# Patient Record
Sex: Female | Born: 1940 | Race: White | Hispanic: No | Smoking: Former smoker
Health system: Southern US, Community
[De-identification: ages and names within clinical notes are randomized; demographics above are authoritative.]

## PROBLEM LIST (undated history)

## (undated) ENCOUNTER — Inpatient Hospital Stay: Admission: AD | Payer: Self-pay | Source: Other Acute Inpatient Hospital | Admitting: Internal Medicine

## (undated) DIAGNOSIS — H8109 Meniere's disease, unspecified ear: Secondary | ICD-10-CM

## (undated) DIAGNOSIS — K219 Gastro-esophageal reflux disease without esophagitis: Secondary | ICD-10-CM

## (undated) DIAGNOSIS — R5381 Other malaise: Secondary | ICD-10-CM

## (undated) DIAGNOSIS — E785 Hyperlipidemia, unspecified: Secondary | ICD-10-CM

## (undated) DIAGNOSIS — E119 Type 2 diabetes mellitus without complications: Secondary | ICD-10-CM

## (undated) DIAGNOSIS — F419 Anxiety disorder, unspecified: Secondary | ICD-10-CM

## (undated) DIAGNOSIS — R251 Tremor, unspecified: Secondary | ICD-10-CM

## (undated) DIAGNOSIS — F319 Bipolar disorder, unspecified: Secondary | ICD-10-CM

## (undated) DIAGNOSIS — E669 Obesity, unspecified: Secondary | ICD-10-CM

## (undated) DIAGNOSIS — D649 Anemia, unspecified: Secondary | ICD-10-CM

## (undated) DIAGNOSIS — I10 Essential (primary) hypertension: Secondary | ICD-10-CM

## (undated) DIAGNOSIS — G43909 Migraine, unspecified, not intractable, without status migrainosus: Secondary | ICD-10-CM

## (undated) DIAGNOSIS — M5430 Sciatica, unspecified side: Secondary | ICD-10-CM

## (undated) DIAGNOSIS — E7800 Pure hypercholesterolemia, unspecified: Secondary | ICD-10-CM

## (undated) DIAGNOSIS — R1310 Dysphagia, unspecified: Secondary | ICD-10-CM

## (undated) DIAGNOSIS — G40909 Epilepsy, unspecified, not intractable, without status epilepticus: Secondary | ICD-10-CM

## (undated) DIAGNOSIS — R569 Unspecified convulsions: Secondary | ICD-10-CM

## (undated) DIAGNOSIS — N189 Chronic kidney disease, unspecified: Secondary | ICD-10-CM

## (undated) DIAGNOSIS — R269 Unspecified abnormalities of gait and mobility: Secondary | ICD-10-CM

## (undated) DIAGNOSIS — D849 Immunodeficiency, unspecified: Secondary | ICD-10-CM

## (undated) DIAGNOSIS — F329 Major depressive disorder, single episode, unspecified: Secondary | ICD-10-CM

## (undated) DIAGNOSIS — F32A Depression, unspecified: Secondary | ICD-10-CM

## (undated) DIAGNOSIS — I214 Non-ST elevation (NSTEMI) myocardial infarction: Secondary | ICD-10-CM

## (undated) HISTORY — DX: Tremor, unspecified: R25.1

## (undated) HISTORY — DX: Hyperlipidemia, unspecified: E78.5

## (undated) HISTORY — DX: Depression, unspecified: F32.A

## (undated) HISTORY — PX: CHOLECYSTECTOMY: SHX55

## (undated) HISTORY — DX: Obesity, unspecified: E66.9

## (undated) HISTORY — DX: Migraine, unspecified, not intractable, without status migrainosus: G43.909

## (undated) HISTORY — PX: CATARACT EXTRACTION: SUR2

## (undated) HISTORY — DX: Sciatica, unspecified side: M54.30

## (undated) HISTORY — DX: Type 2 diabetes mellitus without complications: E11.9

## (undated) HISTORY — DX: Pure hypercholesterolemia, unspecified: E78.00

## (undated) HISTORY — PX: CERVICAL SPINE SURGERY: SHX589

## (undated) HISTORY — PX: TONSILLECTOMY: SUR1361

## (undated) HISTORY — PX: NECK SURGERY: SHX720

## (undated) HISTORY — DX: Major depressive disorder, single episode, unspecified: F32.9

## (undated) HISTORY — DX: Gastro-esophageal reflux disease without esophagitis: K21.9

## (undated) HISTORY — DX: Bipolar disorder, unspecified: F31.9

## (undated) HISTORY — DX: Unspecified convulsions: R56.9

## (undated) HISTORY — PX: OTHER SURGICAL HISTORY: SHX169

## (undated) HISTORY — DX: Anxiety disorder, unspecified: F41.9

## (undated) HISTORY — DX: Unspecified abnormalities of gait and mobility: R26.9

## (undated) HISTORY — DX: Anemia, unspecified: D64.9

## (undated) HISTORY — DX: Meniere's disease, unspecified ear: H81.09

## (undated) HISTORY — DX: Chronic kidney disease, unspecified: N18.9

## (undated) HISTORY — DX: Epilepsy, unspecified, not intractable, without status epilepticus: G40.909

---

## 1975-06-18 HISTORY — PX: OTHER SURGICAL HISTORY: SHX169

## 2008-06-17 HISTORY — PX: COLONOSCOPY: SHX174

## 2013-12-02 ENCOUNTER — Encounter: Payer: Self-pay | Admitting: Neurology

## 2013-12-03 ENCOUNTER — Ambulatory Visit: Payer: Medicare Other | Admitting: Neurology

## 2013-12-15 ENCOUNTER — Encounter (INDEPENDENT_AMBULATORY_CARE_PROVIDER_SITE_OTHER): Payer: Self-pay

## 2013-12-15 ENCOUNTER — Encounter: Payer: Self-pay | Admitting: *Deleted

## 2013-12-15 ENCOUNTER — Ambulatory Visit (INDEPENDENT_AMBULATORY_CARE_PROVIDER_SITE_OTHER): Payer: Medicare Other | Admitting: Neurology

## 2013-12-15 VITALS — BP 196/87 | HR 102 | Ht 60.5 in | Wt 211.0 lb

## 2013-12-15 DIAGNOSIS — D518 Other vitamin B12 deficiency anemias: Secondary | ICD-10-CM

## 2013-12-15 DIAGNOSIS — R269 Unspecified abnormalities of gait and mobility: Secondary | ICD-10-CM

## 2013-12-15 DIAGNOSIS — G250 Essential tremor: Secondary | ICD-10-CM

## 2013-12-15 DIAGNOSIS — G252 Other specified forms of tremor: Secondary | ICD-10-CM

## 2013-12-15 DIAGNOSIS — R6889 Other general symptoms and signs: Secondary | ICD-10-CM

## 2013-12-15 HISTORY — DX: Unspecified abnormalities of gait and mobility: R26.9

## 2013-12-15 NOTE — Progress Notes (Signed)
Reason for visit: Gait disorder  Brooke Gross is a 73 y.o. female  History of present illness:  Brooke Gross is a 73 year old left-handed white female with a history of diabetes and obesity. She has noted a gradual change in her ability to ambulate over the last year. She has had several falls, with the last fall occurring 2 weeks ago. The patient indicates that if she bends over to pick up something, she may feel as if she will fall over. She usually does not fall while walking, but if she is standing still, she may have a tendency to go backwards. She has developed a tremor that has affected both upper extremities, she believes it is slightly worse on the left side. She feels somewhat weak in the legs, with some difficulty with climbing stairs. She denies any significant neck or low back pain or pain down the legs or arms. She has some mild back pain at times. She denies numbness in the feet, or dysesthesias in the feet. There is no family history of tremors. She has had cervical spine surgery done previously in or around 1997. She is sent to this office for further evaluation. She does have some issues with control of the bladder which began one year ago.  Past Medical History  Diagnosis Date  . Bipolar 1 disorder   . Diabetes   . Anxiety   . CKD (chronic kidney disease)   . Hypercholesteremia   . Migraine   . Sciatica   . Vertigo, labyrinthine   . Tremor   . Abnormality of gait 12/15/2013  . Obese     Past Surgical History  Procedure Laterality Date  . Neck surgery  1990s  . Colonoscopy  2010  . Cholecsystectomy  1977  . Tonsillectomy    . Cataract extraction Bilateral     Family History  Problem Relation Age of Onset  . Dementia Mother   . Congestive Heart Failure Mother   . Heart attack Father   . Dementia Father     Social history:  reports that she has quit smoking. Her smoking use included Cigarettes. She has a 7.5 pack-year smoking history. She has never used  smokeless tobacco. She reports that she drinks alcohol. She reports that she does not use illicit drugs.  Medications:  No current outpatient prescriptions on file prior to visit.   No current facility-administered medications on file prior to visit.     Not on File  ROS:  Out of a complete 14 system review of symptoms, the patient complains only of the following symptoms, and all other reviewed systems are negative.  Weakness, dizziness, tremor Depression, change in appetite Ringing in the ears Itching Urinary incontinence Snoring  Blood pressure 196/87, pulse 102, height 5' 0.5" (1.537 m), weight 211 lb (95.709 kg).  Physical Exam  General: The patient is alert and cooperative at the time of the examination. The patient is moderately to markedly obese.  Eyes: Pupils are equal, round, and reactive to light. Discs are flat bilaterally.  Neck: The neck is supple, no carotid bruits are noted.  Respiratory: The respiratory examination is clear.  Cardiovascular: The cardiovascular examination reveals a regular rate and rhythm, no obvious murmurs or rubs are noted.  Skin: Extremities are without significant edema.  Neurologic Exam  Mental status: The patient is alert and oriented x 3 at the time of the examination. The patient has apparent normal recent and remote memory, with an apparently normal attention span and  concentration ability.  Cranial nerves: Facial symmetry is present. There is good sensation of the face to pinprick and soft touch bilaterally. The strength of the facial muscles and the muscles to head turning and shoulder shrug are normal bilaterally. Speech is well enunciated, no aphasia or dysarthria is noted. Extraocular movements are full. Visual fields are full. The tongue is midline, and the patient has symmetric elevation of the soft palate. No obvious hearing deficits are noted.  Motor: The motor testing reveals 5 over 5 strength of all 4 extremities. Good  symmetric motor tone is noted throughout.  Sensory: Sensory testing is intact to pinprick, soft touch, vibration sensation, and position sense on all 4 extremities. No evidence of extinction is noted.  Coordination: Cerebellar testing reveals good finger-nose-finger and heel-to-shin bilaterally. A mild intention tremor seen with finger-nose-finger bilaterally.  Gait and station: Gait is normal. The patient is able to arise from a seated position with arms crossed. Once up, the patient has arm swing bilaterally. Tandem gait is minimally unsteady. Romberg is negative. No drift is seen.  Reflexes: Deep tendon reflexes are symmetric, but are somewhat depressed bilaterally. Toes are downgoing bilaterally.   Assessment/Plan:  1. Gait disorder  2. Tremor  3. History of diabetes  The patient does not have definite signs of parkinsonism. The patient does have an intention tremor bilaterally in upper extremities that appears to be relatively symmetric. She has a very mild gait disorder that has been present for about one year. The patient will need to be evaluated for possible small vessel disease or normal pressure hydrocephalus. Blood work will be done today. The patient has no evidence of a significant peripheral neuropathy that could be causing the gait disorder.  Marlan Palau. Keith Willis MD 12/15/2013 6:33 PM  [facility] [address] [place], [place] [id number]  Phone [phone] Fax [phone]

## 2013-12-15 NOTE — Patient Instructions (Signed)

## 2013-12-17 LAB — COPPER, SERUM: Copper: 169 ug/dL — ABNORMAL HIGH (ref 72–166)

## 2013-12-17 LAB — TSH: TSH: 2.09 u[IU]/mL (ref 0.450–4.500)

## 2013-12-17 LAB — RPR: RPR: NONREACTIVE

## 2013-12-17 LAB — VITAMIN B12: Vitamin B-12: 213 pg/mL (ref 211–946)

## 2013-12-17 LAB — CK: Total CK: 109 U/L (ref 24–173)

## 2013-12-21 ENCOUNTER — Telehealth: Payer: Self-pay | Admitting: Neurology

## 2013-12-21 DIAGNOSIS — D518 Other vitamin B12 deficiency anemias: Secondary | ICD-10-CM

## 2013-12-21 LAB — SPECIMEN STATUS REPORT

## 2013-12-21 NOTE — Telephone Encounter (Signed)
I called patient. The blood work was unremarkable with exception that the vitamin B12 level was in the low normal range. I will try to see if I can get a methylmalonic acid level added to the blood work drawn previously.

## 2013-12-22 ENCOUNTER — Ambulatory Visit
Admission: RE | Admit: 2013-12-22 | Discharge: 2013-12-22 | Disposition: A | Discharge status: Home / Self Care | Payer: Medicare Other | Source: Ambulatory Visit | Attending: Neurology | Admitting: Neurology

## 2013-12-22 DIAGNOSIS — R269 Unspecified abnormalities of gait and mobility: Secondary | ICD-10-CM

## 2013-12-22 DIAGNOSIS — G252 Other specified forms of tremor: Secondary | ICD-10-CM

## 2013-12-22 DIAGNOSIS — G250 Essential tremor: Secondary | ICD-10-CM

## 2013-12-23 ENCOUNTER — Telehealth: Payer: Self-pay | Admitting: Neurology

## 2013-12-23 NOTE — Telephone Encounter (Signed)
  I called patient. The MRI the brain shows minimal white matter changes. The patient has a history of diabetes. This would not explain the balance issues. The patient does have a low normal B12 level, methylmalonic acid level is pending. I would recommend that she go ahead and start B12 supplementation, 1000 mcg daily.   MRI brain 12/23/2013:  Impression   Abnormal MRI brain (without) demonstrating: 1. Moderate perisylvian atrophy. 2. 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  3. No acute findings.

## 2013-12-23 NOTE — Telephone Encounter (Signed)
Patient just missed a call regarding her results. Please call to advise.

## 2013-12-24 ENCOUNTER — Telehealth: Payer: Self-pay | Admitting: Neurology

## 2013-12-24 LAB — SPECIMEN STATUS REPORT

## 2013-12-24 LAB — METHYLMALONIC ACID, SERUM: Methylmalonic Acid: 951 nmol/L — ABNORMAL HIGH (ref 0–378)

## 2013-12-24 NOTE — Telephone Encounter (Signed)
I called patient. The methylmalonic acid level was elevated suggesting that the low normal B12 level was functionally deficient. I'll have the patient come in for a B12 injection, and the patient will start oral B12 supplementation, 1000 mcg daily.

## 2013-12-24 NOTE — Telephone Encounter (Signed)
I called pt relayed the MRI results, and low B12, start B12 ( ) supplementation po daily.  She verbalized understanding.   MMA pending.   Relayed to Arlene.

## 2013-12-27 NOTE — Telephone Encounter (Signed)
Patient has scheduled for 12/28/13 9:30-B12 injection

## 2013-12-28 ENCOUNTER — Ambulatory Visit (INDEPENDENT_AMBULATORY_CARE_PROVIDER_SITE_OTHER): Payer: Medicare Other | Admitting: *Deleted

## 2013-12-28 DIAGNOSIS — E538 Deficiency of other specified B group vitamins: Secondary | ICD-10-CM

## 2013-12-28 MED ORDER — CYANOCOBALAMIN 1000 MCG/ML IJ SOLN
1000.0000 ug | Freq: Once | INTRAMUSCULAR | Status: AC
  Administered 2013-12-28: 1000 ug via INTRAMUSCULAR

## 2013-12-28 NOTE — Patient Instructions (Signed)
Pt will continue with oral B 12 supplementation ( po daily).

## 2013-12-28 NOTE — Progress Notes (Signed)
Pt here for B 12 injection.  Under aseptic technique cyanocobalamin 1000mcg/1ml IM given R deltoid.  Tolerated well.  Bandaid applied.  

## 2014-03-18 ENCOUNTER — Inpatient Hospital Stay (HOSPITAL_COMMUNITY): Payer: Medicare Other

## 2014-03-18 ENCOUNTER — Encounter (HOSPITAL_COMMUNITY): Payer: Self-pay

## 2014-03-18 ENCOUNTER — Inpatient Hospital Stay (HOSPITAL_COMMUNITY)
Admission: AD | Admit: 2014-03-18 | Discharge: 2014-04-07 | DRG: 870 | Disposition: A | Discharge status: Skilled Nursing Facility | Payer: Medicare Other | Source: Other Acute Inpatient Hospital | Attending: Internal Medicine | Admitting: Internal Medicine

## 2014-03-18 DIAGNOSIS — J9601 Acute respiratory failure with hypoxia: Secondary | ICD-10-CM

## 2014-03-18 DIAGNOSIS — J810 Acute pulmonary edema: Secondary | ICD-10-CM

## 2014-03-18 DIAGNOSIS — E785 Hyperlipidemia, unspecified: Secondary | ICD-10-CM | POA: Diagnosis present

## 2014-03-18 DIAGNOSIS — E118 Type 2 diabetes mellitus with unspecified complications: Secondary | ICD-10-CM

## 2014-03-18 DIAGNOSIS — J9690 Respiratory failure, unspecified, unspecified whether with hypoxia or hypercapnia: Secondary | ICD-10-CM

## 2014-03-18 DIAGNOSIS — E1321 Other specified diabetes mellitus with diabetic nephropathy: Secondary | ICD-10-CM | POA: Diagnosis present

## 2014-03-18 DIAGNOSIS — I2510 Atherosclerotic heart disease of native coronary artery without angina pectoris: Secondary | ICD-10-CM | POA: Diagnosis present

## 2014-03-18 DIAGNOSIS — I059 Rheumatic mitral valve disease, unspecified: Secondary | ICD-10-CM

## 2014-03-18 DIAGNOSIS — F319 Bipolar disorder, unspecified: Secondary | ICD-10-CM | POA: Diagnosis present

## 2014-03-18 DIAGNOSIS — E669 Obesity, unspecified: Secondary | ICD-10-CM | POA: Diagnosis present

## 2014-03-18 DIAGNOSIS — G9340 Encephalopathy, unspecified: Secondary | ICD-10-CM | POA: Diagnosis present

## 2014-03-18 DIAGNOSIS — E876 Hypokalemia: Secondary | ICD-10-CM | POA: Diagnosis present

## 2014-03-18 DIAGNOSIS — E780 Pure hypercholesterolemia: Secondary | ICD-10-CM | POA: Diagnosis present

## 2014-03-18 DIAGNOSIS — Z87891 Personal history of nicotine dependence: Secondary | ICD-10-CM | POA: Diagnosis not present

## 2014-03-18 DIAGNOSIS — I359 Nonrheumatic aortic valve disorder, unspecified: Secondary | ICD-10-CM

## 2014-03-18 DIAGNOSIS — Z6841 Body Mass Index (BMI) 40.0 and over, adult: Secondary | ICD-10-CM

## 2014-03-18 DIAGNOSIS — I5043 Acute on chronic combined systolic (congestive) and diastolic (congestive) heart failure: Secondary | ICD-10-CM | POA: Diagnosis present

## 2014-03-18 DIAGNOSIS — Z9289 Personal history of other medical treatment: Secondary | ICD-10-CM

## 2014-03-18 DIAGNOSIS — G40901 Epilepsy, unspecified, not intractable, with status epilepticus: Secondary | ICD-10-CM

## 2014-03-18 DIAGNOSIS — N183 Chronic kidney disease, stage 3 (moderate): Secondary | ICD-10-CM | POA: Diagnosis present

## 2014-03-18 DIAGNOSIS — G40301 Generalized idiopathic epilepsy and epileptic syndromes, not intractable, with status epilepticus: Secondary | ICD-10-CM

## 2014-03-18 DIAGNOSIS — I4510 Unspecified right bundle-branch block: Secondary | ICD-10-CM | POA: Diagnosis present

## 2014-03-18 DIAGNOSIS — D509 Iron deficiency anemia, unspecified: Secondary | ICD-10-CM | POA: Diagnosis present

## 2014-03-18 DIAGNOSIS — N189 Chronic kidney disease, unspecified: Secondary | ICD-10-CM

## 2014-03-18 DIAGNOSIS — I214 Non-ST elevation (NSTEMI) myocardial infarction: Secondary | ICD-10-CM

## 2014-03-18 DIAGNOSIS — E119 Type 2 diabetes mellitus without complications: Secondary | ICD-10-CM

## 2014-03-18 DIAGNOSIS — G9349 Other encephalopathy: Secondary | ICD-10-CM | POA: Diagnosis present

## 2014-03-18 DIAGNOSIS — J811 Chronic pulmonary edema: Secondary | ICD-10-CM

## 2014-03-18 DIAGNOSIS — E1165 Type 2 diabetes mellitus with hyperglycemia: Secondary | ICD-10-CM | POA: Diagnosis present

## 2014-03-18 DIAGNOSIS — F419 Anxiety disorder, unspecified: Secondary | ICD-10-CM | POA: Diagnosis present

## 2014-03-18 DIAGNOSIS — Z22322 Carrier or suspected carrier of Methicillin resistant Staphylococcus aureus: Secondary | ICD-10-CM | POA: Diagnosis not present

## 2014-03-18 DIAGNOSIS — J690 Pneumonitis due to inhalation of food and vomit: Secondary | ICD-10-CM | POA: Diagnosis present

## 2014-03-18 DIAGNOSIS — I129 Hypertensive chronic kidney disease with stage 1 through stage 4 chronic kidney disease, or unspecified chronic kidney disease: Secondary | ICD-10-CM | POA: Diagnosis present

## 2014-03-18 DIAGNOSIS — I639 Cerebral infarction, unspecified: Secondary | ICD-10-CM

## 2014-03-18 DIAGNOSIS — R40244 Other coma, without documented Glasgow coma scale score, or with partial score reported: Secondary | ICD-10-CM | POA: Diagnosis present

## 2014-03-18 DIAGNOSIS — Z7982 Long term (current) use of aspirin: Secondary | ICD-10-CM | POA: Diagnosis not present

## 2014-03-18 DIAGNOSIS — Z4659 Encounter for fitting and adjustment of other gastrointestinal appliance and device: Secondary | ICD-10-CM

## 2014-03-18 DIAGNOSIS — R6521 Severe sepsis with septic shock: Principal | ICD-10-CM | POA: Diagnosis present

## 2014-03-18 DIAGNOSIS — E1122 Type 2 diabetes mellitus with diabetic chronic kidney disease: Secondary | ICD-10-CM

## 2014-03-18 DIAGNOSIS — R569 Unspecified convulsions: Secondary | ICD-10-CM | POA: Diagnosis present

## 2014-03-18 HISTORY — DX: Essential (primary) hypertension: I10

## 2014-03-18 HISTORY — DX: Non-ST elevation (NSTEMI) myocardial infarction: I21.4

## 2014-03-18 LAB — CBC
HCT: 33.2 % — ABNORMAL LOW (ref 36.0–46.0)
Hemoglobin: 10.6 g/dL — ABNORMAL LOW (ref 12.0–15.0)
MCH: 28.7 pg (ref 26.0–34.0)
MCHC: 31.9 g/dL (ref 30.0–36.0)
MCV: 90 fL (ref 78.0–100.0)
Platelets: 233 10*3/uL (ref 150–400)
RBC: 3.69 MIL/uL — ABNORMAL LOW (ref 3.87–5.11)
RDW: 14.1 % (ref 11.5–15.5)
WBC: 14.7 10*3/uL — ABNORMAL HIGH (ref 4.0–10.5)

## 2014-03-18 LAB — POCT I-STAT 3, ART BLOOD GAS (G3+)
Acid-base deficit: 2 mmol/L (ref 0.0–2.0)
Bicarbonate: 22.9 mEq/L (ref 20.0–24.0)
O2 Saturation: 97 %
Patient temperature: 99
TCO2: 24 mmol/L (ref 0–100)
pCO2 arterial: 39.8 mmHg (ref 35.0–45.0)
pH, Arterial: 7.368 (ref 7.350–7.450)
pO2, Arterial: 92 mmHg (ref 80.0–100.0)

## 2014-03-18 LAB — LACTIC ACID, PLASMA
Lactic Acid, Venous: 1.8 mmol/L (ref 0.5–2.2)
Lactic Acid, Venous: 1.7 mmol/L (ref 0.5–2.2)
Lactic Acid, Venous: 1 mmol/L (ref 0.5–2.2)

## 2014-03-18 LAB — COMPREHENSIVE METABOLIC PANEL
ALT: 29 U/L (ref 0–35)
AST: 109 U/L — ABNORMAL HIGH (ref 0–37)
Albumin: 3 g/dL — ABNORMAL LOW (ref 3.5–5.2)
Alkaline Phosphatase: 57 U/L (ref 39–117)
Anion gap: 11 (ref 5–15)
BUN: 35 mg/dL — ABNORMAL HIGH (ref 6–23)
CO2: 22 mEq/L (ref 19–32)
Calcium: 7.6 mg/dL — ABNORMAL LOW (ref 8.4–10.5)
Chloride: 107 mEq/L (ref 96–112)
Creatinine, Ser: 1.5 mg/dL — ABNORMAL HIGH (ref 0.50–1.10)
GFR calc Af Amer: 39 mL/min — ABNORMAL LOW (ref >90)
GFR calc non Af Amer: 34 mL/min — ABNORMAL LOW (ref >90)
Glucose, Bld: 231 mg/dL — ABNORMAL HIGH (ref 70–99)
Potassium: 3.2 mEq/L — ABNORMAL LOW (ref 3.7–5.3)
Sodium: 140 mEq/L (ref 137–147)
Total Bilirubin: 0.2 mg/dL — ABNORMAL LOW (ref 0.3–1.2)
Total Protein: 6.4 g/dL (ref 6.0–8.3)

## 2014-03-18 LAB — PROTIME-INR
INR: 1.14 (ref 0.00–1.49)
Prothrombin Time: 14.6 seconds (ref 11.6–15.2)

## 2014-03-18 LAB — GLUCOSE, CAPILLARY
Glucose-Capillary: 164 mg/dL — ABNORMAL HIGH (ref 70–99)
Glucose-Capillary: 169 mg/dL — ABNORMAL HIGH (ref 70–99)
Glucose-Capillary: 189 mg/dL — ABNORMAL HIGH (ref 70–99)
Glucose-Capillary: 190 mg/dL — ABNORMAL HIGH (ref 70–99)
Glucose-Capillary: 194 mg/dL — ABNORMAL HIGH (ref 70–99)
Glucose-Capillary: 271 mg/dL — ABNORMAL HIGH (ref 70–99)

## 2014-03-18 LAB — BLOOD GAS, ARTERIAL
Acid-base deficit: 4.2 mmol/L — ABNORMAL HIGH (ref 0.0–2.0)
Bicarbonate: 20.6 mEq/L (ref 20.0–24.0)
Drawn by: 331761
FIO2: 1 %
MECHVT: 460 mL
O2 Saturation: 100 %
PEEP: 5 cmH2O
Patient temperature: 98.6
RATE: 14 resp/min
TCO2: 21.8 mmol/L (ref 0–100)
pCO2 arterial: 39.1 mmHg (ref 35.0–45.0)
pH, Arterial: 7.341 — ABNORMAL LOW (ref 7.350–7.450)
pO2, Arterial: 455 mmHg — ABNORMAL HIGH (ref 80.0–100.0)

## 2014-03-18 LAB — MRSA PCR SCREENING: MRSA by PCR: POSITIVE — AB

## 2014-03-18 LAB — MAGNESIUM: Magnesium: 1.3 mg/dL — ABNORMAL LOW (ref 1.5–2.5)

## 2014-03-18 LAB — TROPONIN I
Troponin I: >20 ng/mL (ref <0.30)
Troponin I: >20 ng/mL (ref <0.30)
Troponin I: >20 ng/mL (ref <0.30)

## 2014-03-18 LAB — HEPARIN LEVEL (UNFRACTIONATED)
Heparin Unfractionated: 0.81 IU/mL — ABNORMAL HIGH (ref 0.30–0.70)
Heparin Unfractionated: 0.87 IU/mL — ABNORMAL HIGH (ref 0.30–0.70)
Heparin Unfractionated: 0.69 IU/mL (ref 0.30–0.70)

## 2014-03-18 LAB — PHOSPHORUS: Phosphorus: 2.9 mg/dL (ref 2.3–4.6)

## 2014-03-18 LAB — TRIGLYCERIDES: Triglycerides: 52 mg/dL (ref <150)

## 2014-03-18 MED ORDER — CHLORHEXIDINE GLUCONATE CLOTH 2 % EX PADS: 6.0000 | MEDICATED_PAD | Freq: Every day | CUTANEOUS | Status: DC

## 2014-03-18 MED ORDER — MIDAZOLAM BOLUS VIA INFUSION
20.0000 mg | Freq: Once | INTRAVENOUS | Status: AC
  Administered 2014-03-18: 20 mg via INTRAVENOUS
  Filled 2014-03-18: qty 20

## 2014-03-18 MED ORDER — SODIUM CHLORIDE 0.9 % IV BOLUS (SEPSIS)
500.0000 mL | Freq: Once | INTRAVENOUS | Status: AC
  Administered 2014-03-18: 500 mL via INTRAVENOUS

## 2014-03-18 MED ORDER — HEPARIN (PORCINE) IN NACL 100-0.45 UNIT/ML-% IJ SOLN
750.0000 [IU]/h | INTRAMUSCULAR | Status: DC
  Filled 2014-03-18 (×3): qty 250

## 2014-03-18 MED ORDER — FENTANYL CITRATE 0.05 MG/ML IJ SOLN
50.0000 ug | INTRAMUSCULAR | Status: DC | PRN
  Administered 2014-03-18 – 2014-03-23 (×18): 50 ug via INTRAVENOUS
  Filled 2014-03-18 (×18): qty 2

## 2014-03-18 MED ORDER — PROPOFOL 10 MG/ML IV EMUL
0.0000 ug/kg/min | INTRAVENOUS | Status: DC
  Filled 2014-03-18 (×2): qty 100

## 2014-03-18 MED ORDER — LORAZEPAM 2 MG/ML IJ SOLN
2.0000 mg | Freq: Once | INTRAMUSCULAR | Status: AC
  Administered 2014-03-18: 2 mg via INTRAVENOUS

## 2014-03-18 MED ORDER — DEXTROSE 5 % IV SOLN
10.0000 mg/kg | Freq: Two times a day (BID) | INTRAVENOUS | Status: DC
  Administered 2014-03-18 – 2014-03-20 (×4): 470 mg via INTRAVENOUS
  Filled 2014-03-18 (×5): qty 9.4

## 2014-03-18 MED ORDER — VANCOMYCIN HCL 10 G IV SOLR
1250.0000 mg | INTRAVENOUS | Status: DC
  Administered 2014-03-18 – 2014-03-19 (×2): 1250 mg via INTRAVENOUS
  Filled 2014-03-18 (×3): qty 1250

## 2014-03-18 MED ORDER — MIDAZOLAM HCL 2 MG/2ML IJ SOLN: 1.0000 mg | INTRAMUSCULAR | Status: DC | PRN

## 2014-03-18 MED ORDER — SODIUM CHLORIDE 0.9 % IV SOLN
200.0000 mg | Freq: Two times a day (BID) | INTRAVENOUS | Status: DC
  Administered 2014-03-18: 200 mg via INTRAVENOUS
  Filled 2014-03-18 (×2): qty 20

## 2014-03-18 MED ORDER — CHLORHEXIDINE GLUCONATE 0.12 % MT SOLN
15.0000 mL | Freq: Two times a day (BID) | OROMUCOSAL | Status: DC
  Administered 2014-03-18 – 2014-03-29 (×24): 15 mL via OROMUCOSAL
  Filled 2014-03-18 (×24): qty 15

## 2014-03-18 MED ORDER — SODIUM CHLORIDE 0.9 % IV SOLN
2.0000 g | Freq: Four times a day (QID) | INTRAVENOUS | Status: DC
  Administered 2014-03-18 – 2014-03-20 (×7): 2 g via INTRAVENOUS
  Filled 2014-03-18 (×13): qty 2000

## 2014-03-18 MED ORDER — INSULIN ASPART 100 UNIT/ML ~~LOC~~ SOLN
0.0000 [IU] | SUBCUTANEOUS | Status: DC
Start: 2014-03-18 — End: 2014-04-04
  Administered 2014-03-18 (×3): 3 [IU] via SUBCUTANEOUS
  Administered 2014-03-18: 5 [IU] via SUBCUTANEOUS
  Administered 2014-03-18: 3 [IU] via SUBCUTANEOUS
  Administered 2014-03-19 (×3): 2 [IU] via SUBCUTANEOUS
  Administered 2014-03-19: 3 [IU] via SUBCUTANEOUS
  Administered 2014-03-19: 2 [IU] via SUBCUTANEOUS
  Administered 2014-03-20: 3 [IU] via SUBCUTANEOUS
  Administered 2014-03-20: 5 [IU] via SUBCUTANEOUS
  Administered 2014-03-20 – 2014-03-21 (×6): 3 [IU] via SUBCUTANEOUS
  Administered 2014-03-21 (×3): 5 [IU] via SUBCUTANEOUS
  Administered 2014-03-21 (×2): 3 [IU] via SUBCUTANEOUS
  Administered 2014-03-22 (×3): 5 [IU] via SUBCUTANEOUS
  Administered 2014-03-22: 3 [IU] via SUBCUTANEOUS
  Administered 2014-03-22 (×2): 5 [IU] via SUBCUTANEOUS
  Administered 2014-03-23: 3 [IU] via SUBCUTANEOUS
  Administered 2014-03-23 – 2014-03-24 (×8): 5 [IU] via SUBCUTANEOUS
  Administered 2014-03-24: 4 [IU] via SUBCUTANEOUS
  Administered 2014-03-24 – 2014-03-25 (×4): 3 [IU] via SUBCUTANEOUS
  Administered 2014-03-25: 5 [IU] via SUBCUTANEOUS
  Administered 2014-03-25 – 2014-03-26 (×4): 3 [IU] via SUBCUTANEOUS
  Administered 2014-03-26 (×2): 2 [IU] via SUBCUTANEOUS
  Administered 2014-03-26 – 2014-03-27 (×3): 3 [IU] via SUBCUTANEOUS
  Administered 2014-03-27: 2 [IU] via SUBCUTANEOUS
  Administered 2014-03-27 – 2014-03-28 (×5): 3 [IU] via SUBCUTANEOUS
  Administered 2014-03-28 (×2): 2 [IU] via SUBCUTANEOUS
  Administered 2014-03-28 – 2014-03-29 (×3): 3 [IU] via SUBCUTANEOUS
  Administered 2014-03-29: 2 [IU] via SUBCUTANEOUS
  Administered 2014-03-29: 3 [IU] via SUBCUTANEOUS
  Administered 2014-03-29 – 2014-03-30 (×2): 2 [IU] via SUBCUTANEOUS
  Administered 2014-03-31: 3 [IU] via SUBCUTANEOUS
  Administered 2014-03-31 (×2): 5 [IU] via SUBCUTANEOUS
  Administered 2014-03-31 (×2): 3 [IU] via SUBCUTANEOUS
  Administered 2014-03-31: 5 [IU] via SUBCUTANEOUS
  Administered 2014-04-01: 2 [IU] via SUBCUTANEOUS
  Administered 2014-04-01 (×2): 3 [IU] via SUBCUTANEOUS
  Administered 2014-04-01 – 2014-04-02 (×3): 5 [IU] via SUBCUTANEOUS
  Administered 2014-04-02 (×2): 3 [IU] via SUBCUTANEOUS
  Administered 2014-04-02: 2 [IU] via SUBCUTANEOUS
  Administered 2014-04-03 (×2): 8 [IU] via SUBCUTANEOUS
  Administered 2014-04-03: 3 [IU] via SUBCUTANEOUS
  Administered 2014-04-03: 2 [IU] via SUBCUTANEOUS
  Administered 2014-04-03: 8 [IU] via SUBCUTANEOUS
  Administered 2014-04-04: 5 [IU] via SUBCUTANEOUS
  Administered 2014-04-04: 2 [IU] via SUBCUTANEOUS

## 2014-03-18 MED ORDER — PROPOFOL BOLUS VIA INFUSION
0.5000 mg/kg | Freq: Once | INTRAVENOUS | Status: AC
  Administered 2014-03-18: 45.4 mg via INTRAVENOUS
  Filled 2014-03-18: qty 46

## 2014-03-18 MED ORDER — FENTANYL CITRATE 0.05 MG/ML IJ SOLN
INTRAMUSCULAR | Status: AC
  Filled 2014-03-18: qty 2

## 2014-03-18 MED ORDER — ETOMIDATE 2 MG/ML IV SOLN
10.0000 mg | Freq: Once | INTRAVENOUS | Status: AC
  Administered 2014-03-18: 10 mg via INTRAVENOUS

## 2014-03-18 MED ORDER — SODIUM CHLORIDE 0.9 % IV SOLN: 250.0000 mL | INTRAVENOUS | Status: DC | PRN

## 2014-03-18 MED ORDER — ASPIRIN 300 MG RE SUPP
324.0000 mg | Freq: Once | RECTAL | Status: AC
  Administered 2014-03-18: 300 mg via RECTAL
  Filled 2014-03-18 (×3): qty 1

## 2014-03-18 MED ORDER — NOREPINEPHRINE BITARTRATE 1 MG/ML IV SOLN: 2.0000 ug/min | INTRAVENOUS | Status: DC

## 2014-03-18 MED ORDER — MUPIROCIN 2 % EX OINT
TOPICAL_OINTMENT | Freq: Two times a day (BID) | CUTANEOUS | Status: DC
Start: 2014-03-18 — End: 2014-03-18

## 2014-03-18 MED ORDER — FENTANYL CITRATE 0.05 MG/ML IJ SOLN: 100.0000 ug | Freq: Once | INTRAMUSCULAR | Status: DC

## 2014-03-18 MED ORDER — MIDAZOLAM HCL 2 MG/2ML IJ SOLN
2.0000 mg | Freq: Once | INTRAMUSCULAR | Status: AC
  Administered 2014-03-18: 2 mg via INTRAVENOUS

## 2014-03-18 MED ORDER — DEXTROSE 5 % IV SOLN
2.0000 g | Freq: Two times a day (BID) | INTRAVENOUS | Status: DC
  Administered 2014-03-18 – 2014-03-21 (×6): 2 g via INTRAVENOUS
  Filled 2014-03-18 (×7): qty 2

## 2014-03-18 MED ORDER — ASPIRIN 300 MG RE SUPP: 300.0000 mg | Freq: Once | RECTAL | Status: DC

## 2014-03-18 MED ORDER — SODIUM CHLORIDE 0.9 % IV SOLN
20.0000 mg/h | INTRAVENOUS | Status: DC
  Administered 2014-03-18 – 2014-03-19 (×4): 20 mg/h via INTRAVENOUS
  Filled 2014-03-18 (×6): qty 10

## 2014-03-18 MED ORDER — SODIUM CHLORIDE 0.9 % IV SOLN
20.0000 mg/kg | Freq: Once | INTRAVENOUS | Status: AC
  Administered 2014-03-18: 1815 mg via INTRAVENOUS
  Filled 2014-03-18: qty 36.3

## 2014-03-18 MED ORDER — SODIUM CHLORIDE 0.9 % IV SOLN
INTRAVENOUS | Status: DC
  Administered 2014-03-18 – 2014-03-20 (×3): via INTRAVENOUS

## 2014-03-18 MED ORDER — PANTOPRAZOLE SODIUM 40 MG IV SOLR
40.0000 mg | Freq: Every day | INTRAVENOUS | Status: DC
Start: 2014-03-18 — End: 2014-03-20
  Administered 2014-03-18 – 2014-03-19 (×2): 40 mg via INTRAVENOUS
  Filled 2014-03-18 (×4): qty 40

## 2014-03-18 MED ORDER — CHLORHEXIDINE GLUCONATE 0.12 % MT SOLN: 15.0000 mL | Freq: Two times a day (BID) | OROMUCOSAL | Status: DC

## 2014-03-18 MED ORDER — SODIUM CHLORIDE 0.9 % IV SOLN
100.0000 mg | Freq: Two times a day (BID) | INTRAVENOUS | Status: DC
  Administered 2014-03-18 – 2014-03-31 (×28): 100 mg via INTRAVENOUS
  Filled 2014-03-18 (×53): qty 10

## 2014-03-18 MED ORDER — FENTANYL CITRATE 0.05 MG/ML IJ SOLN
50.0000 ug | Freq: Once | INTRAMUSCULAR | Status: AC
  Administered 2014-03-18: 50 ug via INTRAVENOUS

## 2014-03-18 MED ORDER — DOPAMINE-DEXTROSE 3.2-5 MG/ML-% IV SOLN
INTRAVENOUS | Status: AC
  Filled 2014-03-18: qty 250

## 2014-03-18 MED ORDER — CHLORHEXIDINE GLUCONATE CLOTH 2 % EX PADS
6.0000 | MEDICATED_PAD | Freq: Every day | CUTANEOUS | Status: AC
  Administered 2014-03-18 – 2014-03-22 (×5): 6 via TOPICAL

## 2014-03-18 MED ORDER — VECURONIUM BROMIDE 10 MG IV SOLR
INTRAVENOUS | Status: AC
  Filled 2014-03-18: qty 10

## 2014-03-18 MED ORDER — MIDAZOLAM HCL 2 MG/2ML IJ SOLN
INTRAMUSCULAR | Status: AC
  Filled 2014-03-18: qty 2

## 2014-03-18 MED ORDER — LORAZEPAM 2 MG/ML IJ SOLN
INTRAMUSCULAR | Status: AC
  Filled 2014-03-18: qty 1

## 2014-03-18 MED ORDER — HEPARIN (PORCINE) IN NACL 100-0.45 UNIT/ML-% IJ SOLN
700.0000 [IU]/h | INTRAMUSCULAR | Status: DC
  Administered 2014-03-18 – 2014-03-19 (×2): 600 [IU]/h via INTRAVENOUS
  Filled 2014-03-18 (×2): qty 250

## 2014-03-18 MED ORDER — DOPAMINE-DEXTROSE 3.2-5 MG/ML-% IV SOLN
0.0000 ug/kg/min | INTRAVENOUS | Status: DC
  Administered 2014-03-18: 5 ug/kg/min via INTRAVENOUS
  Administered 2014-03-18: 15 ug/kg/min via INTRAVENOUS
  Administered 2014-03-19: 18 ug/kg/min via INTRAVENOUS
  Administered 2014-03-19: 20 ug/kg/min via INTRAVENOUS
  Administered 2014-03-20: 19 ug/kg/min via INTRAVENOUS
  Administered 2014-03-20 – 2014-03-21 (×3): 20 ug/kg/min via INTRAVENOUS
  Filled 2014-03-18 (×5): qty 250

## 2014-03-18 MED ORDER — CETYLPYRIDINIUM CHLORIDE 0.05 % MT LIQD: 7.0000 mL | Freq: Four times a day (QID) | OROMUCOSAL | Status: DC

## 2014-03-18 MED ORDER — CEFTRIAXONE SODIUM 2 G IJ SOLR
2.0000 g | INTRAMUSCULAR | Status: DC
  Filled 2014-03-18: qty 2

## 2014-03-18 MED ORDER — PROPOFOL 10 MG/ML IV EMUL
5.0000 ug/kg/min | INTRAVENOUS | Status: DC
  Administered 2014-03-18 (×2): 40 ug/kg/min via INTRAVENOUS
  Administered 2014-03-19: 50 ug/kg/min via INTRAVENOUS
  Administered 2014-03-19: 40 ug/kg/min via INTRAVENOUS
  Administered 2014-03-19: 15 ug/kg/min via INTRAVENOUS
  Administered 2014-03-19 – 2014-03-21 (×8): 50 ug/kg/min via INTRAVENOUS
  Filled 2014-03-18 (×5): qty 100
  Filled 2014-03-18: qty 200
  Filled 2014-03-18 (×6): qty 100

## 2014-03-18 MED ORDER — CETYLPYRIDINIUM CHLORIDE 0.05 % MT LIQD
7.0000 mL | Freq: Four times a day (QID) | OROMUCOSAL | Status: DC
  Administered 2014-03-18 – 2014-04-04 (×65): 7 mL via OROMUCOSAL

## 2014-03-18 MED ORDER — MIDAZOLAM BOLUS VIA INFUSION
20.0000 mg | Freq: Once | INTRAVENOUS | Status: DC
  Filled 2014-03-18: qty 20

## 2014-03-18 MED ORDER — MIDAZOLAM HCL 2 MG/2ML IJ SOLN
1.0000 mg | INTRAMUSCULAR | Status: DC | PRN
Start: 2014-03-18 — End: 2014-03-18

## 2014-03-18 MED ORDER — MAGNESIUM SULFATE 40 MG/ML IJ SOLN
2.0000 g | Freq: Once | INTRAMUSCULAR | Status: AC
  Administered 2014-03-18: 2 g via INTRAVENOUS
  Filled 2014-03-18: qty 50

## 2014-03-18 MED ORDER — LORAZEPAM 2 MG/ML IJ SOLN
INTRAMUSCULAR | Status: AC
  Administered 2014-03-18: 2 mg
  Filled 2014-03-18: qty 1

## 2014-03-18 MED ORDER — MUPIROCIN 2 % EX OINT
1.0000 "application " | TOPICAL_OINTMENT | Freq: Two times a day (BID) | CUTANEOUS | Status: AC
  Administered 2014-03-18 – 2014-03-22 (×10): 1 via NASAL
  Filled 2014-03-18: qty 22

## 2014-03-18 MED ORDER — LEVETIRACETAM IN NACL 500 MG/100ML IV SOLN
500.0000 mg | Freq: Two times a day (BID) | INTRAVENOUS | Status: DC
  Administered 2014-03-18: 500 mg via INTRAVENOUS
  Filled 2014-03-18: qty 100

## 2014-03-18 MED ORDER — LEVETIRACETAM IN NACL 1000 MG/100ML IV SOLN
1000.0000 mg | Freq: Two times a day (BID) | INTRAVENOUS | Status: DC
  Administered 2014-03-18 – 2014-03-19 (×3): 1000 mg via INTRAVENOUS
  Filled 2014-03-18 (×4): qty 100

## 2014-03-18 MED ORDER — POTASSIUM CHLORIDE 20 MEQ/15ML (10%) PO LIQD
40.0000 meq | Freq: Three times a day (TID) | ORAL | Status: AC
  Administered 2014-03-18 (×2): 40 meq
  Filled 2014-03-18 (×4): qty 30

## 2014-03-18 MED FILL — Heparin Sodium (Porcine) 100 Unt/ML in Sodium Chloride 0.45%: INTRAMUSCULAR | Qty: 250 | Status: AC

## 2014-03-18 NOTE — Progress Notes (Addendum)
ANTICOAGULATION CONSULT NOTE- follow up Pharmacy Consult for heparin Indication: chest pain/ACS  No Known Allergies  Patient Measurements: Height: 5' 0.63" (154 cm) (from data on 12/15/13) Weight: 200 lb 2.8 oz (90.8 kg) IBW/kg (Calculated) : 46.95 Heparin Dosing Weight: 90 kg  Vital Signs: Temp: 99.8 F (37.7 C) (10/02 1635) Temp Source: Oral (10/02 1635) BP: 102/53 mmHg (10/02 2230) Pulse Rate: 76 (10/02 2230)  Labs:  Recent Labs  03/18/14 0614 03/18/14 1040 03/18/14 1307 03/18/14 1710 03/18/14 2125  HGB 10.6*  --   --   --   --   HCT 33.2*  --   --   --   --   PLT 233  --   --   --   --   LABPROT 14.6  --   --   --   --   INR 1.14  --   --   --   --   HEPARINUNFRC 0.81*  --  0.87*  --  0.69  CREATININE 1.50*  --   --   --   --   TROPONINI >20.00* >20.00*  --  >20.00*  --     Estimated Creatinine Clearance: 35 ml/min (by C-G formula based on Cr of 1.5).   Assessment: 8 hr heparin level = 0.69 on 750 units/hr IV heparin infusion in this 73 yo female. No bleeding reported.  Current heparin level is above goal 0.3-0.5.  We are targeting lower end of therapeutic range due to cannot rule out stroke.  Noted to have suctioned small amt pink tinged sputum this shift.   [facility], she was continued on heparin drip which was started around midnight on 10/1.  No documentation seen in [facility] reviewed by pharmacist on 10/2  that she received a heparin bolus.  She also has been having seizures.    Goal of Therapy:  HL -0.3 - 0.5  aim for low end eof goal since cannot rule out stroke Monitor platelets by anticoagulation protocol: Yes   Plan:  -decrease heparin to 600 units/hr and check 6 hr HL with AM labs.  -aim for low end of therapeutic goal and no heparin boluses since cannot rule out stroke -daily HL, CBC -Monitor for bleeding  Noah Delaineuth Clark, RPh Clinical Pharmacist Pager: [phone] 03/18/2014 10:39 PM

## 2014-03-18 NOTE — Progress Notes (Addendum)
ANTICOAGULATION CONSULT NOTE - Initial Consult  Pharmacy Consult for heparin Indication: chest pain/ACS  Not on File  Patient Measurements:   Heparin Dosing Weight: 90 kg  Vital Signs:    Labs: No results found for this basename: HGB, HCT, PLT, APTT, LABPROT, INR, HEPARINUNFRC, CREATININE, CKTOTAL, CKMB, TROPONINI,  in the last 72 hours  CrCl is unknown because no creatinine reading has been taken.   Medical History: Past Medical History  Diagnosis Date  . Bipolar 1 disorder   . Diabetes   . Anxiety   . CKD (chronic kidney disease)   . Hypercholesteremia   . Migraine   . Sciatica   . Vertigo, labyrinthine   . Tremor   . Abnormality of gait 12/15/2013  . Obese     Medications:  See medication history  Assessment: 73 yo lady [facility] to continue heparin for elevated troponin.  She is currently on drip at 1000 units/hr which was started around midnight.  I see no documentation that she received a heparin bolus.  She is also having seizures. Goal of Therapy:  Heparin level 0.3-0.7 units/ml Monitor platelets by anticoagulation protocol: Yes   Plan:  Continue heparin at 1000 units/hr Check heparin level at 06:00 Monitor for bleeding  Thanks for allowing pharmacy to be a part of this patient's care.  Talbert CageLora Seay, PharmD Clinical Pharmacist, [phone] 03/18/2014,3:24 AM  Addum:  Heparin level 0.81 units/ml.  Will decrease heparin to 900 units/hr.  Check heparin level 6 hours after rate change.

## 2014-03-18 NOTE — Progress Notes (Signed)
ANTIBIOTIC CONSULT NOTE - INITIAL  Pharmacy Consult for Ceftriaxone, Ampicillin, Acyclovir and Vancomycin Indication: Meningitis  No Known Allergies  Patient Measurements: Height: 5' 0.63" (154 cm) (from data on 12/15/13) Weight: 200 lb 2.8 oz (90.8 kg) IBW/kg (Calculated) : 46.95   Vital Signs: Temp: 97.8 F (36.6 C) (10/02 1200) Temp Source: Oral (10/02 1200) BP: 91/67 mmHg (10/02 1445) Pulse Rate: 84 (10/02 1445) Intake/Output from previous day: 10/01 0701 - 10/02 0700 In: 1165 [I.V.:386.7; IV Piggyback:778.3] Out: 200 [Urine:200] Intake/Output from this shift: Total I/O In: 946.9 [I.V.:811.9; IV Piggyback:135] Out: 100 [Urine:100]  Labs:  Recent Labs  03/18/14 0614  WBC 14.7*  HGB 10.6*  PLT 233  CREATININE 1.50*   Estimated Creatinine Clearance: 35 ml/min (by C-G formula based on Cr of 1.5). No results found for this basename: VANCOTROUGH, Leodis BinetVANCOPEAK, VANCORANDOM, GENTTROUGH, GENTPEAK, GENTRANDOM, TOBRATROUGH, TOBRAPEAK, TOBRARND, AMIKACINPEAK, AMIKACINTROU, AMIKACIN,  in the last 72 hours   Microbiology: Recent Results (from the past 720 hour(s))  MRSA PCR SCREENING     Status: Abnormal   Collection Time    03/18/14  3:08 AM      Result Value Ref Range Status   MRSA by PCR POSITIVE (*) NEGATIVE Final   Comment:            The GeneXpert MRSA Assay (FDA     approved for NASAL specimens     only), is one component of a     comprehensive MRSA colonization     surveillance program. It is not     intended to diagnose MRSA     infection nor to guide or     monitor treatment for     MRSA infections.     RESULT CALLED TO, READ BACK BY AND VERIFIED WITH:     Karie FetchLAUREN HOUT,RN AT [phone] 03/18/14 BY K BARR    Medical History: Past Medical History  Diagnosis Date  . Bipolar 1 disorder   . Diabetes   . Anxiety   . CKD (chronic kidney disease)   . Hypercholesteremia   . Migraine   . Sciatica   . Vertigo, labyrinthine   . Tremor   . Abnormality of gait 12/15/2013   . Obese   . Hypertension   . NSTEMI, initial episode of care 03/18/2014    Medications:  Prescriptions prior to admission  Medication Sig Dispense Refill  . aspirin 81 MG tablet Take 81 mg by mouth daily.      . cyclobenzaprine (FLEXERIL) 10 MG tablet Take 10 mg by mouth 3 (three) times daily as needed for muscle spasms.      Marland Kitchen. exenatide (BYETTA) 10 MCG/0.04ML SOPN injection Inject 10 mcg into the skin 2 (two) times daily with a meal.      . lisinopril (PRINIVIL,ZESTRIL) 20 MG tablet Take 20 mg by mouth daily.      . metFORMIN (GLUCOPHAGE) 1000 MG tablet Take 1,000 mg by mouth 2 (two) times daily with a meal.      . pravastatin (PRAVACHOL) 20 MG tablet Take 20 mg by mouth daily.      Marland Kitchen. venlafaxine XR (EFFEXOR-XR) 75 MG 24 hr capsule Take 75 mg by mouth daily with breakfast.       Scheduled:  . antiseptic oral rinse  7 mL Mouth Rinse QID  . cefTRIAXone (ROCEPHIN)  IV  2 g Intravenous Q24H  . chlorhexidine  15 mL Mouth Rinse BID  . Chlorhexidine Gluconate Cloth  6 each Topical Q0600  . insulin aspart  0-15 Units Subcutaneous 6 times per day  . lacosamide (VIMPAT) IV  100 mg Intravenous Q12H  . levETIRAcetam  1,000 mg Intravenous Q12H  . midazolam  20 mg Intravenous Once  . mupirocin ointment  1 application Nasal BID  . pantoprazole (PROTONIX) IV  40 mg Intravenous QHS  . potassium chloride  40 mEq Per Tube TID   Assessment: 73 yo obese female transferred from [place] early this AM 03/18/14 with status epilepticus and NSTEMI.  Pharmacy consulted to begin antibiotic dosing of ampicillin, acyclovir, vancomycin and ceftriaxone for meningitis.  H/o CKD.  SCr = 1.5, estimated CrCl = 35 ml/min.  WBC elevated to 14.7K, afebrile. .   Goal of Therapy:  Vancomycin trough level 15-20 mcg/ml  Plan:  Ampicillin 2 g I V q6h Ceftriaxone 2 g IV q12h Vancomycin 1250 mg IV q24h Acyclovir 470 mg IV q12h Monitor clinical status, renal function daily.  Check vancomycin trough at steady  state.  Noah Delaine, RPh Clinical Pharmacist Pager: [phone] 03/18/2014,3:20 PM

## 2014-03-18 NOTE — H&P (Signed)
PULMONARY / CRITICAL CARE MEDICINE   Name: Brooke Gross MRN: [id number] DOB: [birth year]    ADMISSION DATE:  03/18/2014  REFERRING MD :  [facility]  CHIEF COMPLAINT:  Seizure  INITIAL PRESENTATION:  73 yo female transferred from [facility] with status epilepticus and NSTEMI.  Intubated for airway protection.  PCCM asked to admit to ICU.  STUDIES:  10/02 CT head [facility]) >> negative  SIGNIFICANT EVENTS: 10/02 Transfer to [facility], neurology, cardiology consulted  HISTORY OF PRESENT ILLNESS:   73 yo female brought to [facility] with seizures.  Hx from chart.  She had several seizures en route to ER and while in ER.  She was also noted to have ECG changes and elevated troponin.  She was transferred to [facility].  On arrival there was concern about her ability to protect her airway, and she was intubated.  She was seen by neurology, placed on several AED's, and recommended to add diprivan.  She was seen by cardiology and started on heparin gtt for NSTEMI.  PAST MEDICAL HISTORY :   has a past medical history of Bipolar 1 disorder; Diabetes; Anxiety; CKD (chronic kidney disease); Hypercholesteremia; Migraine; Sciatica; Vertigo, labyrinthine; Tremor; Abnormality of gait (12/15/2013); and Obese.  has past surgical history that includes Neck surgery (1990s); Colonoscopy (2010); cholecsystectomy (1977); Tonsillectomy; and Cataract extraction (Bilateral). Prior to Admission medications   Medication Sig Start Date End Date Taking? Authorizing Provider  aspirin 81 MG tablet Take 81 mg by mouth daily.    Historical Provider, MD  exenatide (BYETTA) 10 MCG/0.04ML SOPN injection Inject 10 mcg into the skin 2 (two) times daily with a meal.    Historical Provider, MD  lisinopril (PRINIVIL,ZESTRIL) 20 MG tablet Take 20 mg by mouth daily.    Historical Provider, MD  metFORMIN (GLUCOPHAGE) 1000 MG tablet Take 1,000 mg by mouth 2 (two) times daily with a meal.    Historical Provider, MD  pravastatin (PRAVACHOL) 20 MG  tablet Take 20 mg by mouth daily.    Historical Provider, MD  venlafaxine (EFFEXOR) 100 MG tablet Take 100 mg by mouth daily.    Historical Provider, MD   No Known Allergies  FAMILY HISTORY:  indicated that her mother is deceased. She indicated that her father is deceased. She indicated that her sister is alive.  SOCIAL HISTORY:  reports that she has quit smoking. Her smoking use included Cigarettes. She has a 7.5 pack-year smoking history. She has never used smokeless tobacco. She reports that she drinks alcohol. She reports that she does not use illicit drugs.  REVIEW OF SYSTEMS:   Unable to obtain  SUBJECTIVE:   VITAL SIGNS: Weight:  [200 lb 2.8 oz (90.8 kg)] 200 lb 2.8 oz (90.8 kg) (10/02 0334) HEMODYNAMICS:   VENTILATOR SETTINGS:   INTAKE / OUTPUT: No intake or output data in the 24 hours ending 03/18/14 0359  PHYSICAL EXAMINATION: General:  Ill appearing Neuro:  Unresponsive HEENT:  Pupils pinpoint, secretions in posterior pharynx, sonorous respirations, ecchymosis around Rt eye Cardiovascular:  Regular, tachycardic, no murmur Lungs:  Scattered rhonchi Abdomen:  Soft, non tender, + bowel sounds Musculoskeletal:  No edema Skin:  No rashes  LABS:  CBC No results found for this basename: WBC, HGB, HCT, PLT,  in the last 168 hours Coag's No results found for this basename: APTT, INR,  in the last 168 hours BMET No results found for this basename: NA, K, CL, CO2, BUN, CREATININE, GLUCOSE,  in the last 168 hours Electrolytes No results found for this  basename: CALCIUM, MG, PHOS,  in the last 168 hours Sepsis Markers No results found for this basename: LATICACIDVEN, PROCALCITON, O2SATVEN,  in the last 168 hours ABG  Recent Labs Lab 03/18/14 0339  PHART 7.368  PCO2ART 39.8  PO2ART 92.0   Liver Enzymes No results found for this basename: AST, ALT, ALKPHOS, BILITOT, ALBUMIN,  in the last 168 hours Cardiac Enzymes No results found for this basename: TROPONINI,  PROBNP,  in the last 168 hours Glucose No results found for this basename: GLUCAP,  in the last 168 hours  Imaging No results found.  Labs from [facility] reviewed with significant results:  K 2.9, Creatinine 1.77, Troponin 18.6, WBC 20.2, Glucose 319  ASSESSMENT / PLAN:  PULMONARY OETT 10/02 >>  A: Acute respiratory failure 2nd to inability to protect airway. P:   Full vent support until neuro status more stable F/u CXR, ABG  CARDIOVASCULAR A:  NSTEMI. Hx of HTN, HLD. P:  Heparin gtt per cardiology F/u Echo  RENAL A:   Stage 3 CKD. Hypokalemia. P:   F/u and replace electrolytes as needed Monitor urine outpt, electrolytes  GASTROINTESTINAL A:   Nutrition. P:   NPO Protonix for SUP Tube feeds if unable to extubate soon  HEMATOLOGIC A:   Leukocytosis likely 2nd to acute stress. P:  F/u CBC  INFECTIOUS A:   No evidence for infection. 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   P:   SSI Hold outpt metformin  NEUROLOGIC A:   Status epilepticus. Hx of Bipolar, essential tremor, anxiety, Migraine, Vertigo. P:   RASS goal: -2 AED's per neurology Continue diprivan   Family updated:  No family available  Interdisciplinary Family Meeting v Palliative Care Meeting:    CC time 50 minutes.  D/w neurology and cardiology services.  Coralyn HellingVineet Sood, MD [facility] Pulmonary/Critical Care 03/18/2014, 4:14 AM Pager:  [phone] After 3pm call: [phone]

## 2014-03-18 NOTE — Progress Notes (Signed)
Per MD order patient ETT pulled back to 22 cm at the lip. RN will order CXR to check placement.

## 2014-03-18 NOTE — Consult Note (Addendum)
Reason for Consult: Status epilepticus.  HPI:                                                                                                                                          Brooke Gross is an 73 y.o. female history diabetes mellitus, chronic kidney disease, hypercholesterolemia, bipolar disorder and obesity, who was transferred [facility] for management of new-onset generalized seizures as well as acute myocardial infarction. Patient has no history of seizure disorder. She had multiple generalized seizures that [facility] and several in route to [facility]. She was given Ativan total of 4 mg [facility] along with a loading dose of Keppra 1000 mg IV. She received an additional 4 mg of Ativan in route to [facility]. Additional 500 mg of Keppra and 200 mg loading dose of Vimpat were ordered following arrival here. Intubation was planned for airway protection. Troponin level was 18. Patient reportedly had EKG changes as well, consistent with acute ischemia.  Past Medical History  Diagnosis Date  . Bipolar 1 disorder   . Diabetes   . Anxiety   . CKD (chronic kidney disease)   . Hypercholesteremia   . Migraine   . Sciatica   . Vertigo, labyrinthine   . Tremor   . Abnormality of gait 12/15/2013  . Obese     Past Surgical History  Procedure Laterality Date  . Neck surgery  1990s  . Colonoscopy  2010  . Cholecsystectomy  1977  . Tonsillectomy    . Cataract extraction Bilateral     Family History  Problem Relation Age of Onset  . Dementia Mother   . Congestive Heart Failure Mother   . Heart attack Father   . Dementia Father     Social History:  reports that she has quit smoking. Her smoking use included Cigarettes. She has a 7.5 pack-year smoking history. She has never used smokeless tobacco. She reports that she drinks alcohol. She reports that she does not use illicit drugs.  Not on File  MEDICATIONS:                                                                                                                      I have reviewed the patient's current medications.   ROS:  History obtained from unobtainable from patient due to mental status   There were no vitals taken for this visit.   Neurologic Examination:                                                                                                      Patient was unarousable and having intermittent obstructive apnea. No seizure activity was noted during this evaluation. Pupils were equal and reacted normally to light. Movements were roving from side to side slowly and were conjugate. No facial asymmetry was noted. Muscle tone was flaccid throughout. Patient had no abnormal posturing and no spontaneous movements of extremities. Deep tendon reflexes were 1+ and symmetrical. Plantar responses were mute.  No results found for this basename: cbc, bmp, coags, chol, tri, ldl, hga1c    No results found for this or any previous visit (from the past 48 hour(s)).  No results found.   Assessment/Plan:  73 year old lady with new onset seizure activity presenting with generalized status epilepticus in the setting of an acute myocardial infarction. Etiology of seizure activity is unclear. Acute ischemic cerebral infarction cannot be ruled out.  Recommendations: 1. Continue Keppra IV at 1000 mg every 12 hours 2. Continue Vimpat at 100 mg every 12 hours 3. Propofol IV for sedation as well as as seizure control starting at 50 mcg per kilogram per minute following the 20 mg IV bolus 4. EEG to rule out continued subclinical seizure activity. 5. MRI of the brain without contrast to rule out acute cerebral infarction.  We will continue to follow this patient closely with you.  Patient's initial neurology intervention required acute management of status  epilepticus with multiple parenteral anti-epilepsy drugs, in addition to decision making regarding need for airway protection with intubation and placement on mechanical ventilation as well as arranging for PEG evaluation and monitoring. Total critical care time was 60 minutes.  C.R. Roseanne Reno, MD [facility] [phone]  03/18/2014, 3:12 AM

## 2014-03-18 NOTE — Progress Notes (Deleted)
eLink Physician-Brief Progress Note Patient Name: Sharyn DrossHarriett Dunkel DOB: [birth year] MRN: [id number]   Date of Service  03/18/2014  HPI/Events of Note    eICU Interventions  Entered in error     Intervention Category Major Interventions: Hypotension - evaluation and management  MCQUAID, DOUGLAS 03/18/2014, 5:53 PM

## 2014-03-18 NOTE — Progress Notes (Signed)
This is a complicated situation. No acute cardiovascular evaluation or intervention other than echocardiography is indicated. I agree with the note written by Dr. Zachery ConchFriedman last evening. We will continue to follow. Await results of echocardiogram.

## 2014-03-18 NOTE — Progress Notes (Signed)
Orders received from Dr. Amada JupiterKirkpatrick to increase Versed drip to 25 mg/hr.  Alaris pump has Hard lock and will not allow drip to run that high.  Notified pharmacy who asked to clarify order with MD.  Clarified with Dr. Amada JupiterKirkpatrick and he does want versed drip at 25mg / hr.  Consulted with charge nurse Heather and increased drip to 25mg / hr.

## 2014-03-18 NOTE — Progress Notes (Signed)
eLink Physician-Brief Progress Note Patient Name: Brooke DrossHarriett Gross DOB: 06-01-1941 MRN: [id number]   Date of Service  03/18/2014  HPI/Events of Note    eICU Interventions  Retract ETT 2cm     Intervention Category Minor Interventions: Routine modifications to care plan (e.g. PRN medications for pain, fever)  MCQUAID, DOUGLAS 03/18/2014, 4:47 PM

## 2014-03-18 NOTE — Progress Notes (Signed)
Pt's sbp dropped to 60s-70s at 0910. Dilantin stopped.  Dr. Molli KnockYacoub ordered Dopamine drip which was started.  SBP increased to 110s. Dilantin restarted.

## 2014-03-18 NOTE — Progress Notes (Signed)
ANTICOAGULATION CONSULT NOTE Pharmacy Consult for heparin Indication: chest pain/ACS  No Known Allergies  Patient Measurements: Weight: 200 lb 2.8 oz (90.8 kg) Heparin Dosing Weight: 90 kg  Vital Signs: Temp: 97.8 F (36.6 C) (10/02 1200) Temp Source: Oral (10/02 1200) BP: 91/47 mmHg (10/02 1225) Pulse Rate: 90 (10/02 1227)  Labs:  Recent Labs  03/18/14 0614 03/18/14 1040 03/18/14 1307  HGB 10.6*  --   --   HCT 33.2*  --   --   PLT 233  --   --   LABPROT 14.6  --   --   INR 1.14  --   --   HEPARINUNFRC 0.81*  --  0.87*  CREATININE 1.50*  --   --   TROPONINI >20.00* >20.00*  --     The CrCl is unknown because both a height and weight (above a minimum accepted value) are required for this calculation.   Assessment: 73 yo lady [facility] to continue heparin drip which was started around midnight on 10/1.  [facility] reviewed by pharmacist on 10/2  that she received a heparin bolus.  She is also having seizures. Her HL was 0.81 on 1000 units/hr and rate decreased to 900 units/hr.  HL on 900 units/hr is elevated at 0.87.  CBC wnl, no bleeding reported.  Would like to keep HL at low end of therapeutic range since an ischemic stroke cannot be ruled out.   Goal of Therapy:  HL -0.3 - 0.5  aim for low end of goal since cannot rule out stroke Monitor platelets by anticoagulation protocol: Yes   Plan:  -decrease heparin to 750 units/hr and check 8 hr HL -aim for low end of therapeutic goal and no heparin boluses since cannot rule out stroke -daily HL, CBC -Monitor for bleeding  Herby AbrahamMichelle T. Bell, Pharm.D. [phone] 03/18/2014 1:41 PM

## 2014-03-18 NOTE — Progress Notes (Signed)
STAT EEG completed and turned into LTVM. LTVM up and running

## 2014-03-18 NOTE — Procedures (Signed)
Intubation Procedure Note Brooke DrossHarriett Gross [id number] [birth year]  Procedure: Intubation Indications: Airway protection and maintenance  Procedure Details Consent: Unable to obtain consent because of altered level of consciousness. Time Out: Verified patient identification, verified procedure, site/side was marked, verified correct patient position, special equipment/implants available, medications/allergies/relevent history reviewed, required imaging and test results available.  Performed  Maximum sterile technique was used including antiseptics, gloves and hand hygiene.  MAC and 3    Evaluation Hemodynamic Status: BP stable throughout; O2 sats: stable throughout Patient's Current Condition: stable Complications: No apparent complications Patient did tolerate procedure well. Chest X-ray ordered to verify placement.  CXR: pending.   7.5 ETT inserted to 23 cm at lip with glidescope.  Given 2 mg versed, 50 mcg fentanyl, 10 mg etomidate.  Coralyn HellingVineet Sood, MD [facility] 03/18/2014, 4:16 AM Pager:  [phone] After 3pm call: [phone]

## 2014-03-18 NOTE — Progress Notes (Signed)
Patient Name: Brooke Gross Date of Encounter: 03/18/2014  Principal Problem:   Status epilepticus Active Problems:   NSTEMI, initial episode of care   Diabetes    Patient Profile: 73 yo female w/ bipolar DO, DM, CKD, HLD, obesity, was admitted 10/02 early am w/ status epilepticus and NSTEMI.  SUBJECTIVE: Last sedation approx 2 hr ago, unresponsive on the vent  OBJECTIVE Filed Vitals:   03/18/14 0800 03/18/14 0820 03/18/14 0830 03/18/14 0845  BP: 106/59 111/71 101/65 97/56  Pulse: 79 82 81 78  Resp: 27 20 17 17   Weight:      SpO2: 98% 100% 99% 100%    Intake/Output Summary (Last 24 hours) at 03/18/14 1009 Last data filed at 03/18/14 0800  Gross per 24 hour  Intake 1274.13 ml  Output    200 ml  Net 1074.13 ml   Filed Weights   03/18/14 0334  Weight: 200 lb 2.8 oz (90.8 kg)    PHYSICAL EXAM General: Well developed, well nourished, female unresponsive on the vent. Head: Normocephalic.  Neck: No JVD seen difficult to assess secondary to habitus and equipment. Lungs:  Resp regular and unlabored, bilateral rhonchi. Heart: RRR, S1, S2, no S3, S4, or murmur; no rub. Abdomen: Soft, non-tender, non-distended, BS + x 4.  Extremities: No clubbing, cyanosis, no edema.  Neuro: Unresponsive on the vent  LABS: CBC:  Recent Labs  03/18/14 0614  WBC 14.7*  HGB 10.6*  HCT 33.2*  MCV 90.0  PLT 233   INR:  Recent Labs  03/18/14 0614  INR 1.14   Basic Metabolic Panel:  Recent Labs  16/10/96 0614  NA 140  K 3.2*  CL 107  CO2 22  GLUCOSE 231*  BUN 35*  CREATININE 1.50*  CALCIUM 7.6*  MG 1.3*  PHOS 2.9   Liver Function Tests:  Recent Labs  03/18/14 0614  AST 109*  ALT 29  ALKPHOS 57  BILITOT 0.2*  PROT 6.4  ALBUMIN 3.0*   Cardiac Enzymes:  Recent Labs  03/18/14 0614  TROPONINI >20.00*   Fasting Lipid Panel:  Recent Labs  03/18/14 0614  TRIG 52   TELE:  SR, no significant ectopy  Radiology/Studies: Dg Chest Port 1  View 03/18/2014   CLINICAL DATA:  Endotracheal tube placement.  EXAM: PORTABLE CHEST - 1 VIEW  COMPARISON:  None.  FINDINGS: Endotracheal tube tip measures 2.1 cm above the carina. Enteric tube tip is off of the field of view but below the left hemidiaphragm. Mild cardiac enlargement without vascular congestion. No focal airspace disease or consolidation in the lungs. No blunting of costophrenic angles. No pneumothorax.  IMPRESSION: Endotracheal tube tip measures 2.1 cm above the carina.   Electronically Signed   By: Burman Nieves M.D.   On: 03/18/2014 04:27   Dg Abd Portable 1v 03/18/2014   CLINICAL DATA:  OG tube placement.  EXAM: PORTABLE ABDOMEN - 1 VIEW  COMPARISON:  02/14/2014  FINDINGS: Enteric tube tip is in the upper mid abdomen consistent with location in the lower stomach. Visualized bowel gas pattern is unremarkable.  IMPRESSION: Enteric tube tip localizes to the distal stomach.   Electronically Signed   By: Burman Nieves M.D.   On: 03/18/2014 04:28   Current Medications:  . antiseptic oral rinse  7 mL Mouth Rinse QID  . chlorhexidine  15 mL Mouth Rinse BID  . Chlorhexidine Gluconate Cloth  6 each Topical Q0600  . insulin aspart  0-15 Units Subcutaneous 6 times per day  .  lacosamide (VIMPAT) IV  100 mg Intravenous Q12H  . levETIRAcetam  1,000 mg Intravenous Q12H  . mupirocin ointment  1 application Nasal BID  . pantoprazole (PROTONIX) IV  40 mg Intravenous QHS  . phenytoin (DILANTIN) IV  20 mg/kg Intravenous Once   . sodium chloride 100 mL/hr at 03/18/14 0333  . heparin 900 Units/hr (03/18/14 0708)  . propofol      ASSESSMENT AND PLAN: Principal Problem:   Status epilepticus - per CCM, Neuro    Hypotension - SBP decreased to 90s, mgt per CCM, planning to start pressors.  Active Problems:   NSTEMI, initial episode of care - continue to cycle troponins, echo pending, had ASA this am, will need daily, not on statin or BB due to neuro/BP issues. MD advise on adding daily ASA,  pr or per tube.    Diabetes - per CCM, on SSI   Melida QuitterSigned, Rhonda Barrett , PA-C 10:09 AM 03/18/2014

## 2014-03-18 NOTE — Progress Notes (Signed)
Called by bedside RN, patient's BP dropping and family is bedside with questions.  Evidently patient was a DNR prior to this and was intubated in [place].  HR in the 60s and SBP in the 70.  Will start peripheral dopamine for now, attempt to minimize sedation (but issue is that without sedation patient develops seizures).   Spoke with family, the living will available is very vague, will continue being full code but they would not want prolonged support if patient will have major neurologic deficits as she lived independently prior to this and would not want to live in a debilitated state.  Additional CC time 40 minutes.  Alyson ReedyWesam G. Yacoub, M.D. [facility]/Critical Care Medicine. Pager: [phone]. After hours pager: [phone].

## 2014-03-18 NOTE — Consult Note (Addendum)
CARDIOLOGY CONSULT NOTE   Patient ID: Brooke DrossHarriett Calise MRN: [id number], DOB/AGE: [birth year]   Admit date: 03/18/2014 Date of Consult: 03/18/2014   Primary Physician: Estanislado PandySASSER,PAUL W, MD Primary Cardiologist: None  Pt. Profile  73F DM, CKD, HLD, bipolar disorder with recurrent seizures requiring intubation and NSTEMI.   Problem List  Past Medical History  Diagnosis Date  . Bipolar 1 disorder   . Diabetes   . Anxiety   . CKD (chronic kidney disease)   . Hypercholesteremia   . Migraine   . Sciatica   . Vertigo, labyrinthine   . Tremor   . Abnormality of gait 12/15/2013  . Obese     Past Surgical History  Procedure Laterality Date  . Neck surgery  1990s  . Colonoscopy  2010  . Cholecsystectomy  1977  . Tonsillectomy    . Cataract extraction Bilateral      Allergies  No Known Allergies  HPI 73F DM, CKD, HLD, bipolar disorder with recurrent seizures requiring intubation and NSTEMI.   History is limited as patient is currently intubated and transfer records are limited. Per documentation, EMS was called to evaluate the patient for seizures which are apparently new. She had a seizure en route and was given 2mg  IV ativan. She was able to open her eyes to painful stimuli but unable to interact or provide any history. [facility] she had multiple additional seizures and was given ativan, keppra, and vimpat. Labs notable for K2.9, Cr. 1.77, CK-MB 52.9, TnI 18.68, WBC 20. ECG with new RBBB and interior and anterolateral STD. Head CT was without acute intracranial abnormalities. In setting of NSTEMI, she was started on IV heparin and [facility]. Presenting symptoms are not clear based on transfer paperwork and so it is unclear if there was any preceding chest pain.   Inpatient Medications  . fentaNYL      . fentaNYL  100 mcg Intravenous Once  . lacosamide (VIMPAT) IV  200 mg Intravenous Q12H  . levETIRAcetam  500 mg Intravenous Q12H  . midazolam       . midazolam  2 mg Intravenous Once  . pantoprazole (PROTONIX) IV  40 mg Intravenous QHS    Family History Family History  Problem Relation Age of Onset  . Dementia Mother   . Congestive Heart Failure Mother   . Heart attack Father   . Dementia Father      Social History History   Social History  . Marital Status: Divorced    Spouse Name: N/A    Number of Children: 1  . Years of Education: college-2   Occupational History  . Retired   . Accounts Receivable    Social History Main Topics  . Smoking status: Former Smoker -- 0.50 packs/day for 15 years    Types: Cigarettes  . Smokeless tobacco: Never Used     Comment: quit 1977  . Alcohol Use: Yes     Comment: occasional  . Drug Use: No  . Sexual Activity: Not on file   Other Topics Concern  . Not on file   Social History Narrative  . No narrative on file     Review of Systems  Unable to assess.  Physical Exam  There were no vitals taken for this visit.  General: Intubated, sedated Psych: Intubated, sedated Neuro: Intubated, sedated.  HEENT: ETT  Neck: Supple without bruits or JVD. Lungs:  Resp regular and unlabored, CTA. Heart: RRR no s3, s4, or murmurs. Abdomen: Soft,  non-tender, non-distended Extremities: No clubbing, cyanosis or edema. DP/PT/Radials 2+ and equal bilaterally.  Labs  No results found for this basename: CKTOTAL, CKMB, TROPONINI,  in the last 72 hours No results found for this basename: WBC, HGB, HCT, MCV, PLT   No results found for this basename: NA, K, CL, CO2, BUN, CREATININE, CALCIUM, LABALBU, PROT, BILITOT, ALKPHOS, ALT, AST, GLUCOSE,  in the last 168 hours No results found for this basename: CHOL, HDL, LDLCALC, TRIG   No results found for this basename: DDIMER    Radiology/Studies  No results found.  ECG  03-18-2014 20:49 NSR, RBBB. Down sloping STD in inferior and anterolateral leads. PAC. 1.50mm STE aVR. Compared to prior RBBB and STD are new.  04/12/13 NSR  ASSESSMENT  AND PLAN  73F DM, CKD, HLD, bipolar disorder with recurrent seizures requiring intubation and NSTEMI. Her biomarkers suggest a fairly large MI and ECG has many high risk features including new conduction system disease and diffuse ST segment changes including elevation in aVR. 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 She will require cardiac cath if she makes a neurologic recovery and will need medical management for NSTEMI in the meantime.   1. IV UFH 2. 325mg  ASA, followed by 81mg  daily 3. Atorvastatin 80mg  4. Would add on low dose metoprolol (6.25mg  BID) if BP allows 5. Obtain TTE in AM 6. A1c, lipid panel  Signed, Glori Luis, MD 03/18/2014, 3:29 AM

## 2014-03-18 NOTE — Progress Notes (Signed)
Echocardiogram 2D Echocardiogram has been performed.  BROWN, CALEBA 03/18/2014, 10:22 AM

## 2014-03-18 NOTE — Progress Notes (Addendum)
Subjective: Had episode of right arm jerking overnight, then again this morning. Has recurrent sharply contoured beta in teh left posterior region.   Exam: Filed Vitals:   03/18/14 0820  BP: 111/71  Pulse: 82  Resp: 20   Gen: In bed, NAD MS: does not open eyes or follow commands.  WU:JWJXBCN:PERRL, eyes conjugate,  Motor: minimal withdrawal L > R Sensory:as above  EEG: left posterior status epilepticus.   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 She had clinical correlate fo right arm twitchign with left posterior recurrent runs of sharply contoured activity. I feel that this is consistent with status epilepticus. She has now failed 4 AEDs and will proceed to burst suppression.   The etiology of her SE is unclear. Possibilities include new ischemic stroke, infection, underlying predisposition. The focal nature argues against a genreliaed metabolic etiology such as EtOH withdrawal.   Recommendations: 1) Continues dilantin, keppra, vimpat 2) will start midazolam to try to gain control of seizure.  3) Also given profound nature of mental status change and unclear etiology of new refractory status epilepticus, infection would be a consideration. She is currently being anticoagulated and would favor empiric coverage at this time.   This patient is critically ill and at significant risk of neurological worsening, death and care requires constant monitoring of vital signs, hemodynamics,respiratory and cardiac monitoring, neurological assessment, discussion with family, other specialists and medical decision making of high complexity. I spent 60 minutes of neurocritical care time  in the care of  this patient.  Ritta SlotMcNeill Kirkpatrick, MD [facility] [phone]  If 7pm- 7am, please page neurology on call as listed in AMION. 03/18/2014  3:37 PM

## 2014-03-18 NOTE — Procedures (Signed)
Central Venous Catheter Insertion Procedure Note Brooke DrossHarriett Gross [id number] Dec 23, 1940  Procedure: Insertion of Central Venous Catheter Indications: Assessment of intravascular volume, Drug and/or fluid administration and Frequent blood sampling  Procedure Details Consent: Risks of procedure as well as the alternatives and risks of each were explained to the (patient/caregiver).  Consent for procedure obtained. Time Out: Verified patient identification, verified procedure, site/side was marked, verified correct patient position, special equipment/implants available, medications/allergies/relevent history reviewed, required imaging and test results available.  Performed  Maximum sterile technique was used including antiseptics, cap, gloves, gown, hand hygiene, mask and sheet. Skin prep: Chlorhexidine; local anesthetic administered A antimicrobial bonded/coated triple lumen catheter was placed in the right internal jugular vein using the Seldinger technique. Ultrasound guidance used.Yes.   Catheter placed to 17 cm. Blood aspirated via all 3 ports and then flushed x 3. Line sutured x 2 and dressing applied.  Evaluation Blood flow good Complications: No apparent complications Patient did tolerate procedure well. Chest X-ray ordered to verify placement.  CXR: pending.  Brett CanalesSteve Minor ACNP [facility] Pager [phone] till 3 pm If no answer page [phone] 03/18/2014, 2:52 PM  U/S used in placement.  I was present and supervised the entire procedure.  Brooke ReedyWesam G. Gross, M.D. [facility]/Critical Care Medicine. Pager: [phone]. After hours pager: [phone].

## 2014-03-18 NOTE — Progress Notes (Signed)
CRITICAL VALUE ALERT  Critical value received:  Troponin I: >20  Date of notification:  03/18/2014  Time of notification:  0709  Critical value read back:Yes.    Nurse who received alert:  K Cala BradfordKolangayil James  MD notified (1st page):  Dr. Lucious GrovesSoot  Time of first page:  0711  MD notified (2nd page):  Time of second page:  Responding MD:  Dr. Lucious GrovesSoot  Time MD responded:  [phone]

## 2014-03-18 NOTE — Progress Notes (Signed)
Pt had 4 beat run VT, 1 regular beat and then another 7 beat run VT. Pt asymptomatic.  VSS. PA on call notified.  No new orders.

## 2014-03-18 NOTE — Progress Notes (Signed)
Pt ETT at 22 cm. Per MD order ETT pulled back to 20 cm.

## 2014-03-18 NOTE — Progress Notes (Addendum)
INITIAL NUTRITION ASSESSMENT  DOCUMENTATION CODES Per approved criteria  -Obesity Unspecified   INTERVENTION: If patient remains intubated recommend: Initiate Vital High Protein @ 35 ml/hr via OG tube.   30 ml Prostat BID.    Tube feeding regimen provides 1040 kcal (68% of needs), 103 grams of protein, and 702 ml of H2O.   NUTRITION DIAGNOSIS: Inadequate oral intake related to inability to eat as evidenced by NPO status  Goal: Enteral nutrition to provide 60-70% of estimated calorie needs (22-25 kcals/kg ideal body weight) and 100% of estimated protein needs, based on ASPEN guidelines for permissive underfeeding in critically ill obese individuals  Monitor:  Respiratory status, TF initiation, labs  Reason for Assessment: Ventilator   73 y.o. female  Admitting Dx: Brooke Gross  ASSESSMENT: Pt transferred from [facility] with status epilepticus and NSTEMI. Pt remains intubated.  Patient is currently intubated on ventilator support MV: 7.9 L/min No data recorded. Normal temp used for calculations.  Propofol: none No signs of fat or muscle depletion.  Labs: Blood glucose: 164-271 Potassium and Magnesium low Phosphorus WNL  Height: Ht Readings from Last 1 Encounters:  12/15/13 5' 0.5" (1.537 m)    Weight: Wt Readings from Last 1 Encounters:  03/18/14 200 lb 2.8 oz (90.8 kg)    Ideal Body Weight: 45.4 kg   % Ideal Body Weight: 200%  Wt Readings from Last 10 Encounters:  03/18/14 200 lb 2.8 oz (90.8 kg)  12/15/13 211 lb (95.709 kg)  12/02/13 197 lb (89.359 kg)    Usual Body Weight: 200 lb   % Usual Body Weight: 100%  BMI:  Body mass index is 38.44 kg/(m^2).  Estimated Nutritional Needs: Kcal: 1531 Protein: >/= 90 grams Fluid: > 1.5 L/day  Skin: intact  Diet Order: NPO  EDUCATION NEEDS: -No education needs identified at this time   Intake/Output Summary (Last 24 hours) at 03/18/14 0926 Last data filed at 03/18/14 0800  Gross per 24 hour  Intake  1274.13 ml  Output    200 ml  Net 1074.13 ml    Last BM: PTA    Labs:   Recent Labs Lab 03/18/14 0614  NA 140  K 3.2*  CL 107  CO2 22  BUN 35*  CREATININE 1.50*  CALCIUM 7.6*  MG 1.3*  PHOS 2.9  GLUCOSE 231*    CBG (last 3)   Recent Labs  03/18/14 0337 03/18/14 0821  GLUCAP 271* 164*    Scheduled Meds: . antiseptic oral rinse  7 mL Mouth Rinse QID  . chlorhexidine  15 mL Mouth Rinse BID  . Chlorhexidine Gluconate Cloth  6 each Topical Q0600  . insulin aspart  0-15 Units Subcutaneous 6 times per day  . lacosamide (VIMPAT) IV  100 mg Intravenous Q12H  . levETIRAcetam  1,000 mg Intravenous Q12H  . mupirocin ointment  1 application Nasal BID  . pantoprazole (PROTONIX) IV  40 mg Intravenous QHS  . phenytoin (DILANTIN) IV  20 mg/kg Intravenous Once    Continuous Infusions: . sodium chloride 100 mL/hr at 03/18/14 0333  . heparin 900 Units/hr (03/18/14 0708)  . propofol      Past Medical History  Diagnosis Date  . Bipolar 1 disorder   . Diabetes   . Anxiety   . CKD (chronic kidney disease)   . Hypercholesteremia   . Migraine   . Sciatica   . Vertigo, labyrinthine   . Tremor   . Abnormality of gait 12/15/2013  . Obese   . Hypertension  Past Surgical History  Procedure Laterality Date  . Neck surgery  1990s  . Colonoscopy  2010  . Cholecsystectomy  1977  . Tonsillectomy    . Cataract extraction Bilateral   . Degenerative disc diease      Kendell BaneHeather Pitts RD, LDN, CNSC [phone] Pager [phone] After Hours Pager

## 2014-03-19 ENCOUNTER — Inpatient Hospital Stay (HOSPITAL_COMMUNITY): Payer: Medicare Other

## 2014-03-19 LAB — GLUCOSE, CAPILLARY
Glucose-Capillary: 127 mg/dL — ABNORMAL HIGH (ref 70–99)
Glucose-Capillary: 140 mg/dL — ABNORMAL HIGH (ref 70–99)
Glucose-Capillary: 130 mg/dL — ABNORMAL HIGH (ref 70–99)
Glucose-Capillary: 118 mg/dL — ABNORMAL HIGH (ref 70–99)
Glucose-Capillary: 183 mg/dL — ABNORMAL HIGH (ref 70–99)

## 2014-03-19 LAB — CBC
HCT: 33.7 % — ABNORMAL LOW (ref 36.0–46.0)
Hemoglobin: 11 g/dL — ABNORMAL LOW (ref 12.0–15.0)
MCH: 28.7 pg (ref 26.0–34.0)
MCHC: 32.6 g/dL (ref 30.0–36.0)
MCV: 88 fL (ref 78.0–100.0)
Platelets: 247 10*3/uL (ref 150–400)
RBC: 3.83 MIL/uL — ABNORMAL LOW (ref 3.87–5.11)
RDW: 14.3 % (ref 11.5–15.5)
WBC: 19.1 10*3/uL — ABNORMAL HIGH (ref 4.0–10.5)

## 2014-03-19 LAB — BLOOD GAS, ARTERIAL
Acid-base deficit: 3.1 mmol/L — ABNORMAL HIGH (ref 0.0–2.0)
Bicarbonate: 21.2 mEq/L (ref 20.0–24.0)
Drawn by: 252031
FIO2: 0.4 %
MECHVT: 460 mL
O2 Saturation: 99.1 %
PEEP: 5 cmH2O
Patient temperature: 98.6
RATE: 14 resp/min
TCO2: 22.3 mmol/L (ref 0–100)
pCO2 arterial: 36.2 mmHg (ref 35.0–45.0)
pH, Arterial: 7.384 (ref 7.350–7.450)
pO2, Arterial: 131 mmHg — ABNORMAL HIGH (ref 80.0–100.0)

## 2014-03-19 LAB — CARBOXYHEMOGLOBIN
Carboxyhemoglobin: 1.3 % (ref 0.5–1.5)
Methemoglobin: 0.8 % (ref 0.0–1.5)
O2 Saturation: 82.5 %
Total hemoglobin: 11.2 g/dL — ABNORMAL LOW (ref 12.0–16.0)

## 2014-03-19 LAB — BASIC METABOLIC PANEL
Anion gap: 10 (ref 5–15)
BUN: 22 mg/dL (ref 6–23)
CO2: 24 mEq/L (ref 19–32)
Calcium: 7.2 mg/dL — ABNORMAL LOW (ref 8.4–10.5)
Chloride: 110 mEq/L (ref 96–112)
Creatinine, Ser: 1.09 mg/dL (ref 0.50–1.10)
GFR calc Af Amer: 58 mL/min — ABNORMAL LOW (ref >90)
GFR calc non Af Amer: 50 mL/min — ABNORMAL LOW (ref >90)
Glucose, Bld: 158 mg/dL — ABNORMAL HIGH (ref 70–99)
Potassium: 3.3 mEq/L — ABNORMAL LOW (ref 3.7–5.3)
Sodium: 144 mEq/L (ref 137–147)

## 2014-03-19 LAB — HEPARIN LEVEL (UNFRACTIONATED)
Heparin Unfractionated: 0.39 IU/mL (ref 0.30–0.70)
Heparin Unfractionated: 0.3 IU/mL (ref 0.30–0.70)

## 2014-03-19 LAB — MAGNESIUM: Magnesium: 1.9 mg/dL (ref 1.5–2.5)

## 2014-03-19 LAB — PHOSPHORUS
Phosphorus: 1.2 mg/dL — ABNORMAL LOW (ref 2.3–4.6)
Phosphorus: 2.2 mg/dL — ABNORMAL LOW (ref 2.3–4.6)

## 2014-03-19 MED ORDER — NOREPINEPHRINE BITARTRATE 1 MG/ML IV SOLN
0.0000 ug/min | INTRAVENOUS | Status: DC
  Administered 2014-03-19: 4 ug/min via INTRAVENOUS
  Administered 2014-03-20: 2 ug/min via INTRAVENOUS
  Administered 2014-03-21: 10 ug/min via INTRAVENOUS
  Administered 2014-03-22: 6 ug/min via INTRAVENOUS
  Administered 2014-03-22: 8 ug/min via INTRAVENOUS
  Administered 2014-03-23: 4 ug/min via INTRAVENOUS
  Filled 2014-03-19 (×5): qty 4

## 2014-03-19 MED ORDER — PHENYTOIN SODIUM 50 MG/ML IJ SOLN
100.0000 mg | Freq: Three times a day (TID) | INTRAMUSCULAR | Status: DC
  Administered 2014-03-19 – 2014-04-01 (×38): 100 mg via INTRAVENOUS
  Filled 2014-03-19 (×41): qty 2

## 2014-03-19 MED ORDER — ASPIRIN 300 MG RE SUPP
300.0000 mg | Freq: Every day | RECTAL | Status: DC
Start: 2014-03-19 — End: 2014-03-21
  Administered 2014-03-19 – 2014-03-21 (×3): 300 mg via RECTAL
  Filled 2014-03-19 (×3): qty 1

## 2014-03-19 MED ORDER — SODIUM CHLORIDE 0.9 % IV SOLN
2.0000 mg/h | INTRAVENOUS | Status: DC
  Administered 2014-03-19 – 2014-03-21 (×10): 20 mg/h via INTRAVENOUS
  Administered 2014-03-22: 15 mg/h via INTRAVENOUS
  Administered 2014-03-22: 2 mg/h via INTRAVENOUS
  Administered 2014-03-22: 10 mg/h via INTRAVENOUS
  Administered 2014-03-23: 1 mg/h via INTRAVENOUS
  Filled 2014-03-19 (×14): qty 20

## 2014-03-19 MED ORDER — POTASSIUM CHLORIDE 10 MEQ/50ML IV SOLN
10.0000 meq | INTRAVENOUS | Status: AC
  Administered 2014-03-19 (×2): 10 meq via INTRAVENOUS
  Filled 2014-03-19 (×2): qty 50

## 2014-03-19 MED ORDER — LEVETIRACETAM IN NACL 1500 MG/100ML IV SOLN
1500.0000 mg | Freq: Two times a day (BID) | INTRAVENOUS | Status: DC
  Administered 2014-03-19 – 2014-03-31 (×25): 1500 mg via INTRAVENOUS
  Filled 2014-03-19 (×28): qty 100

## 2014-03-19 MED ORDER — SODIUM PHOSPHATE 3 MMOLE/ML IV SOLN
30.0000 mmol | Freq: Once | INTRAVENOUS | Status: AC
  Administered 2014-03-19: 30 mmol via INTRAVENOUS
  Filled 2014-03-19: qty 10

## 2014-03-19 MED ORDER — VITAL HIGH PROTEIN PO LIQD
1000.0000 mL | ORAL | Status: DC
  Administered 2014-03-19: 1000 mL
  Filled 2014-03-19 (×4): qty 1000

## 2014-03-19 NOTE — Progress Notes (Signed)
ANTICOAGULATION CONSULT NOTE - Follow Up Consult  Pharmacy Consult for Heparin  Indication: chest pain/ACS  No Known Allergies  Patient Measurements: Height: 5' 0.63" (154 cm) (from data on 12/15/13) Weight: 207 lb 10.8 oz (94.2 kg) IBW/kg (Calculated) : 46.95  Vital Signs: Temp: 99 F (37.2 C) (10/03 0345) Temp Source: Axillary (10/03 0345) BP: 145/67 mmHg (10/03 0430) Pulse Rate: 84 (10/03 0430)  Labs:  Recent Labs  03/18/14 0614 03/18/14 1040 03/18/14 1307 03/18/14 1710 03/18/14 2125 03/19/14 0330  HGB 10.6*  --   --   --   --  11.0*  HCT 33.2*  --   --   --   --  33.7*  PLT 233  --   --   --   --  247  LABPROT 14.6  --   --   --   --   --   INR 1.14  --   --   --   --   --   HEPARINUNFRC 0.81*  --  0.87*  --  0.69 0.39  CREATININE 1.50*  --   --   --   --  1.09  TROPONINI >20.00* >20.00*  --  >20.00*  --   --     Estimated Creatinine Clearance: 49.2 ml/min (by C-G formula based on Cr of 1.09).   Assessment: Therapeutic heparin level x 1 (goal 0.3-0.5), other labs as above.   Goal of Therapy:  Heparin level 0.3-0.5 units/ml Monitor platelets by anticoagulation protocol: Yes   Plan:  -Continue heparin at 600 units/hr -1100 HL -Daily CBC/HL -Monitor for bleeding  Abran DukeLedford, James 03/19/2014,4:53 AM

## 2014-03-19 NOTE — Progress Notes (Addendum)
ANTICOAGULATION CONSULT NOTE- follow up Pharmacy Consult for heparin Indication: chest pain/ACS  No Known Allergies  Patient Measurements: Height: 5' 0.63" (154 cm) (from data on 12/15/13) Weight: 207 lb 10.8 oz (94.2 kg) IBW/kg (Calculated) : 46.95 Heparin Dosing Weight: 90 kg  Vital Signs: Temp: 97.8 F (36.6 C) (10/03 1151) Temp Source: Axillary (10/03 1151) BP: 117/40 mmHg (10/03 1000) Pulse Rate: 80 (10/03 1100)  Labs:  Recent Labs  03/18/14 0614 03/18/14 1040  03/18/14 1710 03/18/14 2125 03/19/14 0330 03/19/14 1040  HGB 10.6*  --   --   --   --  11.0*  --   HCT 33.2*  --   --   --   --  33.7*  --   PLT 233  --   --   --   --  247  --   LABPROT 14.6  --   --   --   --   --   --   INR 1.14  --   --   --   --   --   --   HEPARINUNFRC 0.81*  --   < >  --  0.69 0.39 0.30  CREATININE 1.50*  --   --   --   --  1.09  --   TROPONINI >20.00* >20.00*  --  >20.00*  --   --   --   < > = values in this interval not displayed.  Estimated Creatinine Clearance: 49.2 ml/min (by C-G formula based on Cr of 1.09).   Assessment: [age] yo female on heparin for NSTEMI. HL is 0.3 on heparin rate of 600 units/hr which is therapeutic.   We are targeting lower end of therapeutic range due to cannot rule out stroke.  Noted to have suctioned small amt pink tinged sputum last night.   Upon [facility], she was continued on heparin drip which was started around midnight on 10/1.  No documentation seen in [facility] reviewed by pharmacist on 10/2  that she received a heparin bolus.  She also has been having seizures.    Goal of Therapy:  HL -0.3 - 0.5  aim for low end of goal since cannot rule out stroke Monitor platelets by anticoagulation protocol: Yes   Plan:  - continue heparin drip at 600 units/hr  -aim for low end of therapeutic goal and no heparin boluses since cannot rule out stroke -daily HL, CBC -Monitor for bleeding  Herby AbrahamMichelle T. Bell,  Pharm.D. [phone] 03/19/2014 12:11 PM

## 2014-03-19 NOTE — H&P (Signed)
PULMONARY / CRITICAL CARE MEDICINE   Name: Brooke Gross MRN: [id number] DOB: [birth year]    ADMISSION DATE:  03/18/2014  REFERRING MD :  [facility]  CHIEF COMPLAINT:  Seizure  INITIAL PRESENTATION:  73 yo female transferred from [facility] with status epilepticus and NSTEMI.  Intubated for airway protection.  PCCM asked to admit to ICU.  STUDIES:  10/02 CT head [facility]) >> negative 10/02 Echo >> EF 55%, grade 1 diastolic dysfx  SIGNIFICANT EVENTS: 10/02 Transfer to [facility], neurology, cardiology consulted  SUBJECTIVE:  Intubated and heavily sedated per Neuo request. No WUA.  VITAL SIGNS: Temp:  [97.8 F (36.6 C)-99.8 F (37.7 C)] 98.7 F (37.1 C) (10/03 0800) Pulse Rate:  [75-92] 80 (10/03 1100) Resp:  [13-29] 14 (10/03 1100) BP: (79-152)/(25-67) 117/40 mmHg (10/03 1000) SpO2:  [90 %-100 %] 99 % (10/03 1100) FiO2 (%):  [30 %-40 %] 40 % (10/03 1100) Weight:  [207 lb 10.8 oz (94.2 kg)] 207 lb 10.8 oz (94.2 kg) (10/03 0351) HEMODYNAMICS:   VENTILATOR SETTINGS: Vent Mode:  [-] PRVC FiO2 (%):  [30 %-40 %] 40 % Set Rate:  [14 bmp-16 bmp] 14 bmp Vt Set:  [460 mL] 460 mL PEEP:  [5 cmH20] 5 cmH20 Plateau Pressure:  [18 cmH20-20 cmH20] 20 cmH20 INTAKE / OUTPUT:  Intake/Output Summary (Last 24 hours) at 03/19/14 1121 Last data filed at 03/19/14 1100  Gross per 24 hour  Intake 4420.35 ml  Output   1492 ml  Net 2928.35 ml    PHYSICAL EXAMINATION: General:  Ill appearing, sedated on vent. Neuro:  Unresponsive HEENT:  Pupils pinpoint,, ecchymosis around Rt eye Cardiovascular:  Regular, tachycardic, no murmur Lungs:  Scattered rhonchi, diminished in bases Abdomen:  Soft, non tender, decreased  bowel sounds Musculoskeletal:  No edema Skin:  No rashes  LABS:  CBC  Recent Labs Lab 03/18/14 0614 03/19/14 0330  WBC 14.7* 19.1*  HGB 10.6* 11.0*  HCT 33.2* 33.7*  PLT 233 247   Coag's  Recent Labs Lab 03/18/14 0614  INR 1.14   BMET  Recent Labs Lab  03/18/14 0614 03/19/14 0330  NA 140 144  K 3.2* 3.3*  CL 107 110  CO2 22 24  BUN 35* 22  CREATININE 1.50* 1.09  GLUCOSE 231* 158*   Electrolytes  Recent Labs Lab 03/18/14 0614 03/19/14 0330  CALCIUM 7.6* 7.2*  MG 1.3* 1.9  PHOS 2.9 1.2*   Sepsis Markers  Recent Labs Lab 03/18/14 0614 03/18/14 1040 03/18/14 1710  LATICACIDVEN 1.8 1.7 1.0   ABG  Recent Labs Lab 03/18/14 0339 03/18/14 0542 03/19/14 0358  PHART 7.368 7.341* 7.384  PCO2ART 39.8 39.1 36.2  PO2ART 92.0 455.0* 131.0*   Liver Enzymes  Recent Labs Lab 03/18/14 0614  AST 109*  ALT 29  ALKPHOS 57  BILITOT 0.2*  ALBUMIN 3.0*   Cardiac Enzymes  Recent Labs Lab 03/18/14 0614 03/18/14 1040 03/18/14 1710  TROPONINI >20.00* >20.00* >20.00*   Glucose  Recent Labs Lab 03/18/14 0821 03/18/14 1210 03/18/14 1633 03/18/14 2058 03/18/14 2333 03/19/14 0810  GLUCAP 164* 194* 189* 190* 169* 127*    Imaging Dg Chest Port 1 View  03/18/2014   CLINICAL DATA:  Endotracheal tube and central line placement  EXAM: PORTABLE CHEST - 1 VIEW  COMPARISON:  03/18/2014 at 2:52 p.m.  FINDINGS: The endotracheal tube tip is essentially at the carina and needs to be retracted 3.5 cm. Nasogastric tube enters the stomach. Right IJ line tip: SVC.  Thoracic spondylosis. Low lung volumes.  Blunted left costophrenic angle with mild indistinctness of the left hemidiaphragm which is stable.  IMPRESSION: 1. Endotracheal tube tip is malpositioned at the carina. Retract 3.5 cm. 2. Small left pleural effusion with passive atelectasis. These results will be called to the ordering clinician or representative by the Radiologist Assistant, and communication documented in the PACS or zVision Dashboard.   Electronically Signed   By: Herbie BaltimoreWalt  Liebkemann M.D.   On: 03/18/2014 16:16   Dg Chest Port 1 View  03/18/2014   CLINICAL DATA:  Central line placement.  EXAM: PORTABLE CHEST - 1 VIEW  COMPARISON:  03/18/2014 at 0406 hr  FINDINGS: The  tip of the endotracheal tube appears to project slightly closer to the carina than on the prior study, estimated at 1.3 cm. Enteric tube courses towards the left upper abdomen. A new right jugular catheter has been placed which crosses the midline in the upper mediastinum with tip projecting over the expected region of the left brachiocephalic vein. The cardiomediastinal silhouette is unchanged. There is slightly increased density in the left lung base. Right lung remains grossly clear. No definite pleural effusion or pneumothorax is identified.  IMPRESSION: 1. New right jugular catheter with tip in the region of the left brachiocephalic vein. Recommend repositioning/replacement. 2. Endotracheal tube tip approximately 1.3 cm above the carina. Consider retracting 2-3 cm. 3. Mild left basilar opacity, most likely atelectasis. Critical Value/emergent results were called by telephone at the time of interpretation on 03/18/2014 at 3:12 pm to ICU nurse Dayna BarkerLauren Hout , who verbally acknowledged these results.   Electronically Signed   By: Sebastian AcheAllen  Grady   On: 03/18/2014 15:14   Dg Chest Port 1 View  03/18/2014   CLINICAL DATA:  Endotracheal tube placement.  EXAM: PORTABLE CHEST - 1 VIEW  COMPARISON:  None.  FINDINGS: Endotracheal tube tip measures 2.1 cm above the carina. Enteric tube tip is off of the field of view but below the left hemidiaphragm. Mild cardiac enlargement without vascular congestion. No focal airspace disease or consolidation in the lungs. No blunting of costophrenic angles. No pneumothorax.  IMPRESSION: Endotracheal tube tip measures 2.1 cm above the carina.   Electronically Signed   By: Burman NievesWilliam  Stevens M.D.   On: 03/18/2014 04:27   Dg Abd Portable 1v  03/18/2014   CLINICAL DATA:  OG tube placement.  EXAM: PORTABLE ABDOMEN - 1 VIEW  COMPARISON:  02/14/2014  FINDINGS: Enteric tube tip is in the upper mid abdomen consistent with location in the lower stomach. Visualized bowel gas pattern is  unremarkable.  IMPRESSION: Enteric tube tip localizes to the distal stomach.   Electronically Signed   By: Burman NievesWilliam  Stevens M.D.   On: 03/18/2014 04:28     ASSESSMENT / PLAN:  PULMONARY OETT 10/02 >>  A: Acute respiratory failure 2nd to inability to protect airway. P:   Full vent support until neuro status more stable No wean x 48 hours per Neuro request F/u CXR, ABG  CARDIOVASCULAR Rt IJ CVL 10/02 >>   A:  NSTEMI. Hx of HTN, HLD. Hypotension 2nd to sedation. P:  Heparin gtt per cardiology Continue DA  RENAL A:   Stage 3 CKD. Hypokalemia. P:   F/u and replace electrolytes as needed Monitor urine outpt, electrolytes  GASTROINTESTINAL A:   Nutrition. P:   NPO Protonix for SUP Add Tube feeds 10/03  HEMATOLOGIC A:   Leukocytosis likely 2nd to acute stress. 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   P:   SSI Hold outpt metformin  NEUROLOGIC A:   Status epilepticus. Hx of Bipolar, essential tremor, anxiety, Migraine, Vertigo. P:   RASS goal: -4 AED's per neurology Continue diprivan, versed   Family updated:  Updated pt's son 10/03.  Interdisciplinary Family Meeting v Palliative Care Meeting:   Today: Remain heavily sedated per Neuro request with no weaning or WUA.   Brett Canales Minor ACNP [facility] PCCM Pager [phone] till 3 pm If no answer page [phone] 03/19/2014, 11:26 AM  Reviewed above, examined.  She is to remain in medically induced coma to control seizures.  Will need further cardiac evaluation when more stable.  Continue full vent support >> no WUA until okay with neurology.  CC time 35 minutes.  Coralyn Helling, MD [facility] Pulmonary/Critical Care 03/19/2014, 1:40 PM Pager:  [phone] After 3pm call: [phone]

## 2014-03-19 NOTE — Progress Notes (Signed)
Per MD full vent support until neuro status more stable.

## 2014-03-19 NOTE — Progress Notes (Signed)
[facility]OC[facility] FOR AM LAB REPLACEMENT ONLY  The patient does apply for the [facility] Electrolyte Replacment Protocol based on the criteria listed below:   1. Is GFR >/= 40 ml/min? Yes.    Patient's GFR today is 50 2. Is urine output >/= 0.5 ml/kg/hr for the last 6 hours? Yes.   Patient's UOP is 0.6 ml/kg/hr 3. Is BUN < 60 mg/dL? Yes.    Patient's BUN today is 22 4. Abnormal electrolyte(s): K+3.3  Phos 1.2 5. Ordered repletion with: protocol 6. If a panic level lab has been reported, has the CCM MD in charge been notified? Yes.  .   Physician:  Arsenio LoaderRamaswamy  Bradshaw, Paul Hilliard 03/19/2014 4:59 AM

## 2014-03-19 NOTE — Progress Notes (Signed)
LTM EEG continues per Dr. Amada JupiterKirkpatrick.

## 2014-03-19 NOTE — Progress Notes (Signed)
Patient is on Versed at 20mg /hr and propofol has been decreased to 35 mcg/min. At beginning of shift, patient would withdraw from very deep painful stimuli. Patient now not withdrawing to painful stimuli. Left eye is 3mm and sluggish, right eye is 2 mm and sluggish. Dr Roseanne RenoStewart notified. No new orders at this time.

## 2014-03-19 NOTE — Progress Notes (Signed)
Patient ID: Brooke Gross, female   DOB: 08/23/1940, 73 y.o.   MRN: [id number]     Subjective:    No events overnight   Objective:   Temp:  [97.8 F (36.6 C)-99.8 F (37.7 C)] 97.8 F (36.6 C) (10/03 1151) Pulse Rate:  [75-92] 80 (10/03 1100) Resp:  [13-29] 14 (10/03 1146) BP: (85-152)/(25-67) 117/40 mmHg (10/03 1000) SpO2:  [94 %-100 %] 100 % (10/03 1146) FiO2 (%):  [30 %-40 %] 40 % (10/03 1146) Weight:  [207 lb 10.8 oz (94.2 kg)] 207 lb 10.8 oz (94.2 kg) (10/03 0351)    Filed Weights   03/18/14 0334 03/19/14 0351  Weight: 200 lb 2.8 oz (90.8 kg) 207 lb 10.8 oz (94.2 kg)    Intake/Output Summary (Last 24 hours) at 03/19/14 1210 Last data filed at 03/19/14 1100  Gross per 24 hour  Intake 4300.72 ml  Output   1492 ml  Net 2808.72 ml    Telemetry: NSR  Exam:  General: NAD, sedated  Resp: clear anteriorally  Cardiac: RRR, no m/r/g, no JVD  GI: abdomen soft, NT, ND  MSK: no LE edema  Neuro: sedated   Lab Results:  Basic Metabolic Panel:  Recent Labs Lab 03/18/14 0614 03/19/14 0330  NA 140 144  K 3.2* 3.3*  CL 107 110  CO2 22 24  GLUCOSE 231* 158*  BUN 35* 22  CREATININE 1.50* 1.09  CALCIUM 7.6* 7.2*  MG 1.3* 1.9    Liver Function Tests:  Recent Labs Lab 03/18/14 0614  AST 109*  ALT 29  ALKPHOS 57  BILITOT 0.2*  PROT 6.4  ALBUMIN 3.0*    CBC:  Recent Labs Lab 03/18/14 0614 03/19/14 0330  WBC 14.7* 19.1*  HGB 10.6* 11.0*  HCT 33.2* 33.7*  MCV 90.0 88.0  PLT 233 247    Cardiac Enzymes:  Recent Labs Lab 03/18/14 0614 03/18/14 1040 03/18/14 1710  TROPONINI >20.00* >20.00* >20.00*    BNP: No results found for this basename: PROBNP,  in the last 8760 hours  Coagulation:  Recent Labs Lab 03/18/14 0614  INR 1.14    ECG:   Medications:   Scheduled Medications: . acyclovir  10 mg/kg (Ideal) Intravenous Q12H  . ampicillin (OMNIPEN) IV  2 g Intravenous Q6H  . antiseptic oral rinse  7 mL Mouth Rinse QID  .  cefTRIAXone (ROCEPHIN)  IV  2 g Intravenous Q12H  . chlorhexidine  15 mL Mouth Rinse BID  . Chlorhexidine Gluconate Cloth  6 each Topical Q0600  . insulin aspart  0-15 Units Subcutaneous 6 times per day  . lacosamide (VIMPAT) IV  100 mg Intravenous Q12H  . levETIRAcetam  1,000 mg Intravenous Q12H  . midazolam  20 mg Intravenous Once  . mupirocin ointment  1 application Nasal BID  . pantoprazole (PROTONIX) IV  40 mg Intravenous QHS  . vancomycin  1,250 mg Intravenous Q24H     Infusions: . sodium chloride 100 mL/hr at 03/19/14 0345  . DOPamine 20 mcg/kg/min (03/19/14 0900)  . heparin 600 Units/hr (03/19/14 0900)  . midazolam (VERSED) infusion 20 mg/hr (03/19/14 1000)  . norepinephrine (LEVOPHED) Adult infusion Stopped (03/19/14 0442)  . propofol 25 mcg/kg/min (03/19/14 1100)     PRN Medications:  sodium chloride, fentaNYL, midazolam     Assessment/Plan    1. NSTEMI - patient presented with status epilepticus, found to have significantly elevated troponin now greater than 20. Most recent EKG SR with upsloping ST depressions inferior and lateral precordial leads.   Echo  03/18/14 LVEF 50-55%, inferior hypokinesis, grade I diastolic dysfunction. - due to ongoing seizures and unstable neurological status she has been managed medically for her NSTEMI. Currently on hep gtt. She is not on any POs, once on would start high dose statin and ASA - will write for ASA suppositories at this time.  - based on her echo and normal LVEF, her hypotension with pressor requirement does not appear to be cardiac. Consider transudicing CVP, checking venous O2 sat from central line.   2. Status epilepticus - per critical care team - from neuro notes she has failed 4 AEDs   3. Hypokalemia - recommend keeping K at 4 and Mg at 2    Dina RichJonathan Branch, M.D.

## 2014-03-19 NOTE — Progress Notes (Signed)
Subjective: In burst suppression   Exam: Filed Vitals:   03/19/14 1931  BP:   Pulse: 76  Temp:   Resp: 14   Exam confounded by burst suppression coma.   EEG: burst suppression. Given the clinical correlate of arm twitching with discharges seen yesterday early in the EEG, I do feel that this represented status epilepticus.   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 She had clinical correlate fo right arm twitchign with left posterior recurrent runs of sharply contoured activity. I feel that this is consistent with status epilepticus. She has now failed 4 AEDs and have proceeded to burst suppression.   The etiology of her SE is unclear. Possibilities include new ischemic stroke, infection, underlying predisposition. The focal nature argues against a genreliaed metabolic etiology such as EtOH withdrawal.   Recommendations: 1) Continue dilantin, keppra, vimpat 2) will plan to pause EEG tomorrow to repeat imaging.   3) Also given profound nature of mental status change and unclear etiology of new refractory status epilepticus, infection would be a consideration. She is currently being anticoagulated and have started empiric coverage at this time.     Ritta SlotMcNeill Kirkpatrick, MD [facility] [phone]  If 7pm- 7am, please page neurology on call as listed in AMION. 03/19/2014  8:50 PM

## 2014-03-19 NOTE — Procedures (Addendum)
CONTINUOUS VIDEO-EEG (24 HR) REPORT  PATIENT:  Brooke Gross MRN:  [id number] DOB:  06-09-1941  DATE:  10/8-10/9   Brief History:  Per EEG technician's notes, the patient is a 73 y.o. Female with a history diabetes mellitus, chronic kidney disease, hypercholesterolemia, bipolar disorder and obesity, who was transferred from [facility] for management of new-onset generalized seizures as well as acute myocardial infarction. Patient has no history of seizure disorder. She was given Ativan total of 4 mg at [facility] along with a loading dose of Keppra 1000 mg IV.  Patient reportedly had EKG changes as well, consistent with acute ischemia.  Medications:  Includes heparin ADULT infusion 100 units/mL (25000 units/250 mL)  insulin aspart (novoLOG) injection 0-15 Units  lacosamide (VIMPAT) 100 mg in sodium chloride 0.9 % 25 mL IVPB  levETIRAcetam (KEPPRA) IVPB 1000 mg/100 mL premix   TOPAMAX DILANTIN  Other medications per EMR  Technical Description: The study consists of 24 hour continuous digital video EEG recording with 21 electrodes placed according to the International 10-20 system.  Additional leads included an EKG lead.    EEG DESCRIPTION: This dictation covers patient's LTM recording from 7:30 AM on 10/8 to 7:29 AM on 10/9. There is now persistent left hemisphere delta activity and interhemispheric asymmetry is less pronouned. There are intermittent periodic left hemisphere sharps. These appear quite a bit less frequently and are less well formed and of lower amplitude. No electrographic seizures are seen in this period of recording.   INTERPRETATION: This is an abnormal EEG, due to   #1 Severe slowing and disorganization consistent with a severe encephalopathy of non specific etiology, with sedating medications, this could represent medication effect, but other toxic metabolic and hypoxic injuries remain in the differential.  There is improved appearance of the left  hemisphere, with now some diffuse slowing seen  #2 Ongoing left hemisphere epileptiform activity, predominantly left temporal, indicative of a partial seizure disorder,  no electrographic seizures are seen in this 24 hour portion of the recording. This appears to be improving.  This 24 hour EEG is unchanged from the previous day's findings.   Cannon Kettle  03/25/14 9:50 AM   CONTINUOUS VIDEO-EEG (24 HR) REPORT  PATIENT:  Brooke Gross MRN:  [id number] DOB:  02/05/1941  DATE:  10/7-10/8   Brief History:  Per EEG technician's notes, the patient is a 73 y.o. Female with a history diabetes mellitus, chronic kidney disease, hypercholesterolemia, bipolar disorder and obesity, who was transferred from [facility] for management of new-onset generalized seizures as well as acute myocardial infarction. Patient has no history of seizure disorder. She was given Ativan total of 4 mg at [facility] along with a loading dose of Keppra 1000 mg IV.  Patient reportedly had EKG changes as well, consistent with acute ischemia.  Medications:  Includes heparin ADULT infusion 100 units/mL (25000 units/250 mL)  insulin aspart (novoLOG) injection 0-15 Units  lacosamide (VIMPAT) 100 mg in sodium chloride 0.9 % 25 mL IVPB  levETIRAcetam (KEPPRA) IVPB 1000 mg/100 mL premix   TOPAMAX DILANTIN  Other medications per EMR  Technical Description: The study consists of 24 hour continuous digital video EEG recording with 21 electrodes placed according to the International 10-20 system.  Additional leads included an EKG lead.    EEG DESCRIPTION: This dictation covers patient's LTM recording from 7:30 AM on 10/7 to 7:21 AM on 10/8. There is now persistent left hemisphere delta activity and interhemispheric asymmetry is less pronouned. There are intermittent periodic left  hemisphere sharps. These appear quite a bit less frequently and are less well formed and of lower amplitude. No electrographic seizures are  seen in this period of recording.   INTERPRETATION: This is an abnormal EEG, due to   #1 Severe slowing and disorganization consistent with a severe encephalopathy of non specific etiology, with sedating medications, this could represent medication effect, but other toxic metabolic and hypoxic injuries remain in the differential.  There is improved appearance of the left hemisphere, with now some diffuse slowing seen  #2 Ongoing left hemisphere epileptiform activity, predominantly left temporal, indicative of a partial seizure disorder,  no electrographic seizures are seen in this 24 hour portion of the recording. This appears to be improving  This 24 hour EEG continues to shows improvement over prior findings.   Cannon Kettle  03/24/14 9:35 AM     CONTINUOUS VIDEO-EEG (24 HR) REPORT  PATIENT:  Brooke Gross MRN:  [id number] DOB:  1941-01-05  DATE:  10/6-10/7   Brief History:  Per EEG technician's notes, the patient is a 73 y.o. Female with a history diabetes mellitus, chronic kidney disease, hypercholesterolemia, bipolar disorder and obesity, who was transferred from [facility] for management of new-onset generalized seizures as well as acute myocardial infarction. Patient has no history of seizure disorder. She was given Ativan total of 4 mg at [facility] along with a loading dose of Keppra 1000 mg IV.  Patient reportedly had EKG changes as well, consistent with acute ischemia.  Medications:  Includes fentaNYL (SUBLIMAZE) injection 50 mcg  heparin ADULT infusion 100 units/mL (25000 units/250 mL)  insulin aspart (novoLOG) injection 0-15 Units  lacosamide (VIMPAT) 100 mg in sodium chloride 0.9 % 25 mL IVPB  levETIRAcetam (KEPPRA) IVPB 1000 mg/100 mL premix  midazolam (VERSED) injection 1-2 mg  TOPAMAX  Other medications per EMR  Technical Description: The study consists of 24 hour continuous digital video EEG recording with 21 electrodes placed according to the  International 10-20 system.  Additional leads included an EKG lead.    EEG DESCRIPTION: This dictation covers patient's LTM recording from 7:30 AM on 10/6 to 7:29 AM on 10/7. There is now persistent left hemisphere delta activity and interhemispheric asymmetry is less pronouned. There are intermittent periodic left hemisphere sharps. No electrographic seizures are seen in this period of recording. Left hemisphere epileptiform activity does seem improved in appearance. There does not appear to be any reactivity to external stimulus.  INTERPRETATION: This is an abnormal EEG, due to   #1 Severe slowing and disorganization consistent with a severe encephalopathy of non specific etiology, with sedating medications, this could represent medication effect, but other toxic metabolic and hypoxic injuries remain in the differential.  There is improved appearance of the left hemisphere, with now some diffuse slowing seen  #2 Ongoing left hemisphere epileptiform activity, predominantly left temporal, indicative of a partial seizure disorder,  no electrographic seizures are seen in this 24 hour portion of the recording. This appears to be improved.  This 24 hour EEG shows improvement over prior findings.    Cannon Kettle M.D. 03/23/14     CONTINUOUS VIDEO-EEG (24 HR) REPORT  PATIENT:  Brooke Gross MRN:  [id number] DOB:  [birth year]  DATE:  10/5-10/6    Brief History:  Per EEG technician's notes, the patient is a 73 y.o. Female with a history diabetes mellitus, chronic kidney disease, hypercholesterolemia, bipolar disorder and obesity, who was transferred from [facility] for management of new-onset generalized seizures as well as acute myocardial  infarction. Patient has no history of seizure disorder. She was given Ativan total of 4 mg at [facility] along with a loading dose of Keppra 1000 mg IV.  Patient reportedly had EKG changes as well, consistent with acute ischemia.  Medications:   Includes fentaNYL (SUBLIMAZE) injection 50 mcg  heparin ADULT infusion 100 units/mL (25000 units/250 mL)  insulin aspart (novoLOG) injection 0-15 Units  lacosamide (VIMPAT) 100 mg in sodium chloride 0.9 % 25 mL IVPB  levETIRAcetam (KEPPRA) IVPB 1000 mg/100 mL premix  midazolam (VERSED) injection 1-2 mg  propofol (DIPRIVAN) 10 mg/ml infusion   Other medications per EMR  Technical Description: The study consists of 24 hour continuous digital video EEG recording with 21 electrodes placed according to the International 10-20 system.  Additional leads included an EKG lead.    EEG DESCRIPTION: EEG persists in bust suppression pattern with about 2-4 seconds of suppression followed by brief left hemisphere sharps, as well as spike wave. These have electrophysiological maxima in the left temporal area, mainly F3, T3, T5. The right hemisphere is marked mainly by polymorphic delta activity.  There is no evolution of the epileptiform discharges on the left to suggest electrographic seizures in this period of recording.   INTERPRETATION: This is an abnormal EEG, due to   #1 Severe slowing and disorganization consistent with a severe encephalopathy of non specific etiology, with sedating medications, this could represent medication effect, but other toxic metabolic and hypoxic injuries remain in the differential.   #2 Ongoing left hemisphere epileptiform activity, predominantly left temporal, indicative of a partial seizure disorder,  no electrographic seizures are seen in this 24 hour portion of the recording.   Cannon Kettle M.D. 03/22/14   CONTINUOUS VIDEO-EEG (24 HR) REPORT  PATIENT:  Brooke Gross MRN:  [id number] DOB:  [birth year]  DATE:  10/3-09/2013   Brief History:  Per EEG technician's notes, the patient is a 73 y.o. Female with a history diabetes mellitus, chronic kidney disease, hypercholesterolemia, bipolar disorder and obesity, who was transferred from [facility] for management  of new-onset generalized seizures as well as acute myocardial infarction. Patient has no history of seizure disorder. She was given Ativan total of 4 mg at [facility] along with a loading dose of Keppra 1000 mg IV.  Patient reportedly had EKG changes as well, consistent with acute ischemia.  Medications:  Includes fentaNYL (SUBLIMAZE) injection 50 mcg  heparin ADULT infusion 100 units/mL (25000 units/250 mL)  insulin aspart (novoLOG) injection 0-15 Units  lacosamide (VIMPAT) 100 mg in sodium chloride 0.9 % 25 mL IVPB  levETIRAcetam (KEPPRA) IVPB 1000 mg/100 mL premix  midazolam (VERSED) injection 1-2 mg  propofol (DIPRIVAN) 10 mg/ml infusion   Other medications per EMR  Technical Description: The study consists of continuous digital video EEG recording with 21 electrodes placed according to the International 10-20 system.  Additional leads included an EKG lead.  Activation procedures (hyperventilation, photic stimulation) were not performed.  Electrographic Description: The recording is characterized by  pattern of burst suppression, with short 2-4 second intervals of suppression separated by intervals of 2-4 seconds of asymmetric electrocerebral activity. The bursts activity over the left hemisphere consist primarily of isolated spike-wave complexes phase-reversing over F7-T3 with very low voltage after-following slowing in the posterior leads. Occasionally, short 4-6 second runs of rhythmic spike-wave discharges were noted in the posterior leads (O1, P3, T3).   The right hemisphere remained relatively suppressed with very low voltage (5-10 uV) polymorphic delta activity even during the bursts of activity  on the left side.  Interpretation: This is an abnormal 24-hour continuous video EEG recording.  Findings include: 1.  Burst suppression, asymmetric, with greater suppression on the right 2.  Semi-periodic epileptiform discharges, left hemisphere, maximal over the mid/posterior temporal  region  Clinical Correlation: The findings indicate a severe diffuse encephalopathy with epileptogenic potential noted over the left hemisphere, particularly over the left posterior temporal-occipital region. No ongoing seizure activity was observed that would explain an altered mental status.   Tyna JakschMAYES, BRUCE N, MD 03/20/2014 12:35 PM    CONTINUOUS VIDEO-EEG (24 HR) REPORT  PATIENT:  Brooke Gross MRN:  [id number] DOB:  Sep 07, 1940  DATE:  10/2-08/2013   Brief History:  Per EEG technician's notes, the patient is a 73 y.o. Female with a history diabetes mellitus, chronic kidney disease, hypercholesterolemia, bipolar disorder and obesity, who was [facility] management of new-onset generalized seizures as well as acute myocardial infarction. Patient has no history of seizure disorder. She was [facility] 4 [facility] along with a loading dose of Keppra 1000 mg IV.  Patient reportedly had EKG changes as well, consistent with acute ischemia.  Medications:  Includes fentaNYL (SUBLIMAZE) injection 50 mcg  heparin ADULT infusion 100 units/mL (25000 units/250 mL)  insulin aspart (novoLOG) injection 0-15 Units  lacosamide (VIMPAT) 100 mg in sodium chloride 0.9 % 25 mL IVPB  levETIRAcetam (KEPPRA) IVPB 1000 mg/100 mL premix  midazolam (VERSED) injection 1-2 mg  propofol (DIPRIVAN) 10 mg/ml infusion   Other medications per EMR  Technical Description: The study consists of continuous digital video EEG recording with 21 electrodes placed according to the International 10-20 system.  Additional leads included an EKG lead.  Activation procedures (hyperventilation, photic stimulation) were not performed.  Electrographic Description: The recording is initially characterized by  pattern of burst suppression, with short 2-4 second intervals of suppression separated by intervals of 4-8 seconds of asymmetric electrocerebral activity.  During the bursts of  EEG activity, the activity over the left hemisphere generally began with a moderate voltage sharp wave followed by beta activity over the left occipital leads.  The right hemisphere remained relatively suppressed with low voltage polymorphic delta activity even during the bursts of activity on the left side.  As the study wore on, the burst suppression pattern semi-periodic epileptiform discharges over the left hemisphere, having a fairly broad field but phase reversing in the mid temporal or posterior temporal region.  At no point was there an evolution to suggest electrographic seizure.  Interpretation: This is an abnormal 24-hour continuous video EEG recording.  Findings include: 1.  Burst suppression, asymmetric, with greater suppression on the right 2.  Semi-periodic epileptiform discharges, left hemisphere, maximal over the mid/posterior temporal region  Clinical Correlation: The findings indicate a severe diffuse encephalopathy with epileptogenic potential noted over the left hemisphere, particularly over the left mid/posterior temporal region. The patient does have sedation on board, so the degree of encephalopathy could reflect a medication effect to some extent.  However there is most likely an underlying dysfunction as well, that would be consistent with an anoxic brain injury.  No ongoing seizure activity was observed that would explain an altered mental status. Further clinical correlation required.  Tyna JakschMAYES, BRUCE N, MD 03/19/2014 5:18 PM

## 2014-03-20 ENCOUNTER — Inpatient Hospital Stay (HOSPITAL_COMMUNITY): Payer: Medicare Other

## 2014-03-20 DIAGNOSIS — G40301 Generalized idiopathic epilepsy and epileptic syndromes, not intractable, with status epilepticus: Secondary | ICD-10-CM

## 2014-03-20 LAB — HEPARIN LEVEL (UNFRACTIONATED): Heparin Unfractionated: <0.1 IU/mL — ABNORMAL LOW (ref 0.30–0.70)

## 2014-03-20 LAB — CSF CELL COUNT WITH DIFFERENTIAL
Lymphs, CSF: 52 % (ref 40–80)
Monocyte-Macrophage-Spinal Fluid: 18 % (ref 15–45)
RBC Count, CSF: 3 /mm3 — ABNORMAL HIGH
Segmented Neutrophils-CSF: 30 % — ABNORMAL HIGH (ref 0–6)
Tube #: 1
WBC, CSF: 8 /mm3 — ABNORMAL HIGH (ref 0–5)

## 2014-03-20 LAB — BASIC METABOLIC PANEL
Anion gap: 12 (ref 5–15)
Anion gap: 11 (ref 5–15)
BUN: 16 mg/dL (ref 6–23)
BUN: 15 mg/dL (ref 6–23)
CO2: 21 mEq/L (ref 19–32)
CO2: 22 mEq/L (ref 19–32)
Calcium: 6.7 mg/dL — ABNORMAL LOW (ref 8.4–10.5)
Calcium: 6.5 mg/dL — ABNORMAL LOW (ref 8.4–10.5)
Chloride: 112 mEq/L (ref 96–112)
Chloride: 112 mEq/L (ref 96–112)
Creatinine, Ser: 1.01 mg/dL (ref 0.50–1.10)
Creatinine, Ser: 0.88 mg/dL (ref 0.50–1.10)
GFR calc Af Amer: 63 mL/min — ABNORMAL LOW (ref >90)
GFR calc Af Amer: 75 mL/min — ABNORMAL LOW (ref >90)
GFR calc non Af Amer: 55 mL/min — ABNORMAL LOW (ref >90)
GFR calc non Af Amer: 65 mL/min — ABNORMAL LOW (ref >90)
Glucose, Bld: 125 mg/dL — ABNORMAL HIGH (ref 70–99)
Glucose, Bld: 255 mg/dL — ABNORMAL HIGH (ref 70–99)
Potassium: 3.3 mEq/L — ABNORMAL LOW (ref 3.7–5.3)
Potassium: 3.3 mEq/L — ABNORMAL LOW (ref 3.7–5.3)
Sodium: 145 mEq/L (ref 137–147)
Sodium: 145 mEq/L (ref 137–147)

## 2014-03-20 LAB — PHOSPHORUS: Phosphorus: 1.9 mg/dL — ABNORMAL LOW (ref 2.3–4.6)

## 2014-03-20 LAB — GLUCOSE, CAPILLARY
Glucose-Capillary: 146 mg/dL — ABNORMAL HIGH (ref 70–99)
Glucose-Capillary: 230 mg/dL — ABNORMAL HIGH (ref 70–99)
Glucose-Capillary: 194 mg/dL — ABNORMAL HIGH (ref 70–99)
Glucose-Capillary: 163 mg/dL — ABNORMAL HIGH (ref 70–99)
Glucose-Capillary: 173 mg/dL — ABNORMAL HIGH (ref 70–99)
Glucose-Capillary: 155 mg/dL — ABNORMAL HIGH (ref 70–99)

## 2014-03-20 LAB — APTT: aPTT: 33 seconds (ref 24–37)

## 2014-03-20 LAB — CBC
HCT: 33.4 % — ABNORMAL LOW (ref 36.0–46.0)
Hemoglobin: 10.7 g/dL — ABNORMAL LOW (ref 12.0–15.0)
MCH: 28.2 pg (ref 26.0–34.0)
MCHC: 32 g/dL (ref 30.0–36.0)
MCV: 88.1 fL (ref 78.0–100.0)
Platelets: 234 10*3/uL (ref 150–400)
RBC: 3.79 MIL/uL — ABNORMAL LOW (ref 3.87–5.11)
RDW: 14.5 % (ref 11.5–15.5)
WBC: 11.4 10*3/uL — ABNORMAL HIGH (ref 4.0–10.5)

## 2014-03-20 LAB — TROPONIN I: Troponin I: 7.08 ng/mL (ref <0.30)

## 2014-03-20 LAB — GRAM STAIN

## 2014-03-20 LAB — CALCIUM: Calcium: 7.1 mg/dL — ABNORMAL LOW (ref 8.4–10.5)

## 2014-03-20 LAB — MAGNESIUM: Magnesium: 1.5 mg/dL (ref 1.5–2.5)

## 2014-03-20 LAB — CRYPTOCOCCAL ANTIGEN, CSF: Crypto Ag: NEGATIVE

## 2014-03-20 LAB — PROTEIN AND GLUCOSE, CSF
Glucose, CSF: 123 mg/dL — ABNORMAL HIGH (ref 43–76)
Total  Protein, CSF: 30 mg/dL (ref 15–45)

## 2014-03-20 MED ORDER — SODIUM CHLORIDE 0.9 % IV SOLN
1.0000 g | Freq: Once | INTRAVENOUS | Status: AC
  Administered 2014-03-20: 1 g via INTRAVENOUS
  Filled 2014-03-20: qty 10

## 2014-03-20 MED ORDER — POTASSIUM CHLORIDE 10 MEQ/50ML IV SOLN
10.0000 meq | INTRAVENOUS | Status: AC
  Administered 2014-03-20 (×4): 10 meq via INTRAVENOUS
  Filled 2014-03-20 (×4): qty 50

## 2014-03-20 MED ORDER — MAGNESIUM SULFATE 40 MG/ML IJ SOLN
2.0000 g | Freq: Once | INTRAMUSCULAR | Status: AC
  Administered 2014-03-20: 2 g via INTRAVENOUS
  Filled 2014-03-20: qty 50

## 2014-03-20 MED ORDER — TOPIRAMATE 100 MG PO TABS
200.0000 mg | ORAL_TABLET | Freq: Two times a day (BID) | ORAL | Status: DC
  Administered 2014-03-20 – 2014-03-21 (×2): 200 mg via ORAL
  Filled 2014-03-20 (×3): qty 2

## 2014-03-20 MED ORDER — VANCOMYCIN HCL 10 G IV SOLR
1250.0000 mg | Freq: Two times a day (BID) | INTRAVENOUS | Status: DC
  Administered 2014-03-20 (×2): 1250 mg via INTRAVENOUS
  Filled 2014-03-20 (×3): qty 1250

## 2014-03-20 MED ORDER — ARTIFICIAL TEARS OP OINT
TOPICAL_OINTMENT | Freq: Three times a day (TID) | OPHTHALMIC | Status: DC
  Administered 2014-03-20 – 2014-03-21 (×4): via OPHTHALMIC
  Administered 2014-03-21: 1 via OPHTHALMIC
  Administered 2014-03-22 (×2): via OPHTHALMIC
  Administered 2014-03-22: 1 via OPHTHALMIC
  Administered 2014-03-23 (×2): via OPHTHALMIC
  Administered 2014-03-23: 1 via OPHTHALMIC
  Administered 2014-03-24: 05:00:00 via OPHTHALMIC
  Administered 2014-03-24 (×2): 1 via OPHTHALMIC
  Administered 2014-03-25 (×2): via OPHTHALMIC
  Administered 2014-03-25: 1 via OPHTHALMIC
  Administered 2014-03-26 (×2): via OPHTHALMIC
  Administered 2014-03-26: 1 via OPHTHALMIC
  Administered 2014-03-27 – 2014-03-28 (×5): via OPHTHALMIC
  Administered 2014-03-28: 1 via OPHTHALMIC
  Administered 2014-03-29: 06:00:00 via OPHTHALMIC
  Administered 2014-03-29: 1 via OPHTHALMIC
  Filled 2014-03-20: qty 3.5

## 2014-03-20 MED ORDER — PANTOPRAZOLE SODIUM 40 MG PO PACK
40.0000 mg | PACK | Freq: Every day | ORAL | Status: DC
  Administered 2014-03-20 – 2014-04-02 (×13): 40 mg
  Filled 2014-03-20 (×17): qty 20

## 2014-03-20 MED ORDER — SODIUM CHLORIDE 0.9 % IV SOLN
2.0000 g | Freq: Once | INTRAVENOUS | Status: AC
  Administered 2014-03-20: 2 g via INTRAVENOUS
  Filled 2014-03-20: qty 20

## 2014-03-20 MED ORDER — POTASSIUM PHOSPHATES 15 MMOLE/5ML IV SOLN
30.0000 mmol | Freq: Once | INTRAVENOUS | Status: AC
  Administered 2014-03-20: 30 mmol via INTRAVENOUS
  Filled 2014-03-20 (×2): qty 10

## 2014-03-20 MED ORDER — ATORVASTATIN CALCIUM 80 MG PO TABS
80.0000 mg | ORAL_TABLET | Freq: Every day | ORAL | Status: DC
  Administered 2014-03-20: 80 mg via ORAL
  Filled 2014-03-20 (×2): qty 1

## 2014-03-20 MED ORDER — DEXTROSE 5 % IV SOLN
10.0000 mg/kg | Freq: Three times a day (TID) | INTRAVENOUS | Status: DC
  Administered 2014-03-20 – 2014-03-24 (×11): 470 mg via INTRAVENOUS
  Filled 2014-03-20 (×13): qty 9.4

## 2014-03-20 MED ORDER — SODIUM CHLORIDE 0.9 % IV SOLN
2.0000 g | INTRAVENOUS | Status: DC
  Administered 2014-03-20 – 2014-03-21 (×6): 2 g via INTRAVENOUS
  Filled 2014-03-20 (×9): qty 2000

## 2014-03-20 NOTE — Progress Notes (Signed)
PULMONARY / CRITICAL CARE MEDICINE   Name: Brooke Gross: [id number] DOB: [birth year]    ADMISSION DATE:  03/18/2014  [facility]  CHIEF COMPLAINT:  Seizure  INITIAL PRESENTATION:  73 [facility] epilepticus and NSTEMI.  Intubated for airway protection.  PCCM asked to admit to ICU.  STUDIES:  10/02 CT head [facility] >> negative 10/02 Echo >> EF 55%, grade 1 diastolic dysfx  SIGNIFICANT EVENTS: 10/02 [facility], neurology, cardiology consulted  SUBJECTIVE:  Remains in medically induced coma and on dopamine.  VITAL SIGNS: Temp:  [97 F (36.1 C)-99 F (37.2 C)] 98.2 F (36.8 C) (10/04 0757) Pulse Rate:  [70-81] 71 (10/04 0700) Resp:  [14-15] 14 (10/04 0700) BP: (96-135)/(34-72) 123/61 mmHg (10/04 0700) SpO2:  [98 %-100 %] 100 % (10/04 0700) FiO2 (%):  [40 %] 40 % (10/04 0411) Weight:  [217 lb 9.5 oz (98.7 kg)] 217 lb 9.5 oz (98.7 kg) (10/04 0500) VENTILATOR SETTINGS: Vent Mode:  [-] PRVC FiO2 (%):  [40 %] 40 % Set Rate:  [14 bmp] 14 bmp Vt Set:  [460 mL] 460 mL PEEP:  [5 cmH20] 5 cmH20 Plateau Pressure:  [20 cmH20-22 cmH20] 21 cmH20 INTAKE / OUTPUT:  Intake/Output Summary (Last 24 hours) at 03/20/14 0819 Last data filed at 03/20/14 0700  Gross per 24 hour  Intake 4431.59 ml  Output   1265 ml  Net 3166.59 ml    PHYSICAL EXAMINATION: General: no distress Neuro: RASS -4 HEENT: pupils reactive Rt > Lt Cardiovascular:  Regular, no murmur Lungs:  Scattered rhonchi Abdomen:  Soft, non tender, decreased  bowel sounds Musculoskeletal:  No edema Skin:  No rashes  LABS:  CBC  Recent Labs Lab 03/18/14 0614 03/19/14 0330 03/20/14 0535  WBC 14.7* 19.1* 11.4*  HGB 10.6* 11.0* 10.7*  HCT 33.2* 33.7* 33.4*  PLT 233 247 234   Coag's  Recent Labs Lab 03/18/14 0614  INR 1.14   BMET  Recent Labs Lab 03/19/14 0330 03/19/14 2000 03/20/14 0535  NA 144 145 145  K 3.3* 3.3* 3.3*  CL 110 112  112  CO2 24 21 22   BUN 22 16 15   CREATININE 1.09 1.01 0.88  GLUCOSE 158* 125* 255*   Electrolytes  Recent Labs Lab 03/18/14 0614 03/19/14 0330 03/19/14 2000 03/20/14 0535  CALCIUM 7.6* 7.2* 6.7* 6.5*  MG 1.3* 1.9  --  1.5  PHOS 2.9 1.2* 2.2* 1.9*   Sepsis Markers  Recent Labs Lab 03/18/14 0614 03/18/14 1040 03/18/14 1710  LATICACIDVEN 1.8 1.7 1.0   ABG  Recent Labs Lab 03/18/14 0339 03/18/14 0542 03/19/14 0358  PHART 7.368 7.341* 7.384  PCO2ART 39.8 39.1 36.2  PO2ART 92.0 455.0* 131.0*   Liver Enzymes  Recent Labs Lab 03/18/14 0614  AST 109*  ALT 29  ALKPHOS 57  BILITOT 0.2*  ALBUMIN 3.0*   Cardiac Enzymes  Recent Labs Lab 03/18/14 0614 03/18/14 1040 03/18/14 1710  TROPONINI >20.00* >20.00* >20.00*   Glucose  Recent Labs Lab 03/19/14 0810 03/19/14 1147 03/19/14 1608 03/19/14 2003 03/19/14 2327 03/20/14 0458  GLUCAP 127* 140* 130* 118* 183* 230*    Imaging Dg Chest Port 1 View  03/19/2014   CLINICAL DATA:  Status epilepticus, non ST elevation myocardial infarction, and diabetes. Evaluate endotracheal tube.  EXAM: PORTABLE CHEST - 1 VIEW  COMPARISON:  03/18/2014  FINDINGS: Endotracheal tube has been retracted in the interim, with tip now approximately 4 cm above the carina. Right jugular  central venous catheter tip projects over the mid upper SVC. Enteric tube courses into the left upper abdomen with tip not imaged. Left basilar opacity is unchanged and likely reflects atelectasis and potentially a small left pleural effusion. Right lung remains grossly clear. No pneumothorax is identified.  IMPRESSION: 1. Interval retraction of endotracheal tube, now in satisfactory position. 2. Unchanged left basilar atelectasis and possibly small left pleural effusion.   Electronically Signed   By: Sebastian Ache   On: 03/19/2014 08:01     ASSESSMENT / PLAN:  PULMONARY OETT 10/02 >>  A: Acute respiratory failure 2nd to inability to protect airway. P:    Full vent support until neuro status more stable F/u CXR  CARDIOVASCULAR Rt IJ CVL 10/02 >>   A:  NSTEMI. Hx of HTN, HLD. Hypotension 2nd to sedation. P:  Heparin gtt per cardiology >> ? If this can be d/c'ed soon Continue DA while on high dose sedation  RENAL A:   Stage 3 CKD. Hypokalemia, hypophosphatemia. P:   F/u and replace electrolytes as needed Monitor urine outpt, renal fx  GASTROINTESTINAL A:   Nutrition. P:   NPO Protonix for SUP Tube feeds while on vent  HEMATOLOGIC A:   Leukocytosis likely 2nd to acute stress. 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   P:   SSI Hold outpt metformin  NEUROLOGIC A:   Status epilepticus. Hx of Bipolar, essential tremor, anxiety, Migraine, Vertigo. P:   RASS goal: -4 AED's per neurology Continue diprivan, versed F/u CT head 10/04   Family updated:  Updated pt's son 10/03.  Interdisciplinary Family Meeting v Palliative Care Meeting:   D/w Dr. Amada Jupiter  CC time 35 minutes.  Coralyn Helling, MD [facility] Pulmonary/Critical Care 03/20/2014, 8:19 AM Pager:  [phone] After 3pm call: [phone]

## 2014-03-20 NOTE — Progress Notes (Signed)
Ltm EEG stopped, electrodes removed for pt to go to CT; Electrodes reapplied, EEG restarted once pt returned.

## 2014-03-20 NOTE — Progress Notes (Signed)
Patient ID: Brooke Gross Gross, female   DOB: 03-29-1941, 73 y.o.   MRN: [id number]    Subjective:    No events overnight.   Objective:   Temp:  [97 F (36.1 C)-99 F (37.2 C)] 98.2 F (36.8 C) (10/04 0757) Pulse Rate:  [70-80] 71 (10/04 0700) Resp:  [14-15] 14 (10/04 0700) BP: (96-135)/(34-72) 123/61 mmHg (10/04 0700) SpO2:  [98 %-100 %] 100 % (10/04 0700) FiO2 (%):  [40 %] 40 % (10/04 0913) Weight:  [217 lb 9.5 oz (98.7 kg)] 217 lb 9.5 oz (98.7 kg) (10/04 0500)    Filed Weights   03/18/14 0334 03/19/14 0351 03/20/14 0500  Weight: 200 lb 2.8 oz (90.8 kg) 207 lb 10.8 oz (94.2 kg) 217 lb 9.5 oz (98.7 kg)    Intake/Output Summary (Last 24 hours) at 03/20/14 0953 Last data filed at 03/20/14 0700  Gross per 24 hour  Intake 4371.49 ml  Output   1265 ml  Net 3106.49 ml    Telemetry: NSR  Exam:  General: NAD  Resp: CTAB  Cardiac: RRR, no m/r/g, no JVD, no carotid bruits  GI: abdomen soft, NT, ND  MSK: no LE edema  Neuro: sedated   Lab Results:  Basic Metabolic Panel:  Recent Labs Lab 03/18/14 0614 03/19/14 0330 03/19/14 2000 03/20/14 0535  NA 140 144 145 145  K 3.2* 3.3* 3.3* 3.3*  CL 107 110 112 112  CO2 22 24 21 22   GLUCOSE 231* 158* 125* 255*  BUN 35* 22 16 15   CREATININE 1.50* 1.09 1.01 0.88  CALCIUM 7.6* 7.2* 6.7* 6.5*  MG 1.3* 1.9  --  1.5    Liver Function Tests:  Recent Labs Lab 03/18/14 0614  AST 109*  ALT 29  ALKPHOS 57  BILITOT 0.2*  PROT 6.4  ALBUMIN 3.0*    CBC:  Recent Labs Lab 03/18/14 0614 03/19/14 0330 03/20/14 0535  WBC 14.7* 19.1* 11.4*  HGB 10.6* 11.0* 10.7*  HCT 33.2* 33.7* 33.4*  MCV 90.0 88.0 88.1  PLT 233 247 234    Cardiac Enzymes:  Recent Labs Lab 03/18/14 0614 03/18/14 1040 03/18/14 1710  TROPONINI >20.00* >20.00* >20.00*    BNP: No results found for this basename: PROBNP,  in the last 8760 hours  Coagulation:  Recent Labs Lab 03/18/14 0614  INR 1.14    ECG:   Medications:    Scheduled Medications: . acyclovir  10 mg/kg (Ideal) Intravenous Q12H  . ampicillin (OMNIPEN) IV  2 g Intravenous Q6H  . antiseptic oral rinse  7 mL Mouth Rinse QID  . artificial tears   Both Eyes 3 times per day  . aspirin  300 mg Rectal Daily  . cefTRIAXone (ROCEPHIN)  IV  2 g Intravenous Q12H  . chlorhexidine  15 mL Mouth Rinse BID  . Chlorhexidine Gluconate Cloth  6 each Topical Q0600  . feeding supplement (VITAL HIGH PROTEIN)  1,000 mL Per Tube Q24H  . insulin aspart  0-15 Units Subcutaneous 6 times per day  . lacosamide (VIMPAT) IV  100 mg Intravenous Q12H  . levETIRAcetam  1,500 mg Intravenous Q12H  . midazolam  20 mg Intravenous Once  . mupirocin ointment  1 application Nasal BID  . pantoprazole (PROTONIX) IV  40 mg Intravenous QHS  . phenytoin (DILANTIN) IV  100 mg Intravenous 3 times per day  . vancomycin  1,250 mg Intravenous Q24H     Infusions: . sodium chloride 100 mL/hr at 03/20/14 0234  . DOPamine 20 mcg/kg/min (03/20/14 0931)  .  heparin 700 Units/hr (03/20/14 0939)  . midazolam (VERSED) infusion 20 mg/hr (03/20/14 0700)  . norepinephrine (LEVOPHED) Adult infusion Stopped (03/19/14 0442)  . propofol 50 mcg/kg/min (03/20/14 0750)     PRN Medications:  sodium chloride, fentaNYL, midazolam     Assessment/Plan    1. NSTEMI  - patient presented with status epilepticus, found to have significantly elevated troponin now greater than 20. Most recent EKG SR with upsloping ST depressions inferior and lateral precordial leads.  Echo 03/18/14 LVEF 50-55%, inferior hypokinesis, grade I diastolic dysfunction.  - due to ongoing seizures and unstable neurological status she has been managed medically for her NSTEMI. Currently on hep gtt. She is not on any POs, once on would start high dose statin. Currently on ASA suppository - would recommend 48 hrs of heparin for medical management of NSTEMI, appears started early AM of 03/18/14. Will discontinue this AM, appears plans  for possible lumbar puncture as well and will need to be off.  - based on her echo and normal LVEF, her hypotension with pressor requirement does not appear to be cardiac.Central line O2 sat 82% not consistent with cardiogenic shock.  - repeat EKG and trop this morning.   2. Status epilepticus  - per critical care team  - from neuro notes she has failed multiple AEDs, from note this morning still with continued seizure activity.   3. Hypokalemia  - recommend keeping K at 4 and Mg at 2 in setting of cardiac disease - wrote for 2g of Mg this morning, additional 13m mEq of KCl.          Dina Rich, M.D.

## 2014-03-20 NOTE — Progress Notes (Signed)
Subjective: Continued burst suppression.   Exam: Filed Vitals:   03/20/14 0757  BP:   Pulse:   Temp: 98.2 F (36.8 C)  Resp:    Exam confounded by burst suppression coma.  Her right pupil is larger than left today, both are reactive.   EEG: burst suppression. Bursts continue to contain epileptiform left sided activity.   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 She had clinical correlate fo right arm twitchign with left posterior recurrent runs of sharply contoured activity. I feel that this was consistent with status epilepticus. She has failed 4 AEDs and have proceeded to burst suppression.   The etiology of her SE is unclear. Possibilities include new ischemic stroke, infection, underlying predisposition. The focal nature argues against a genreliaed metabolic etiology such as EtOH withdrawal.   Recommendations: 1) Continue dilantin, keppra, vimpat 2) CT head 3) Also given profound nature of mental status change and unclear etiology of new refractory status epilepticus, infection would be a consideration. She is currently being anticoagulated and have started empiric coverage at this time.  4) will discuss duration of anticoagulation with cardiology, she will need LP if CT confirms safety.  5) following CT, will plan on beginning propofol wean.     Ritta SlotMcNeill Kirkpatrick, MD [facility] [phone]  If 7pm- 7am, please page neurology on call as listed in AMION. 03/20/2014  8:58 AM

## 2014-03-20 NOTE — Progress Notes (Signed)
eLink Physician-Brief Progress Note Patient Name: Brooke DrossHarriett Pham DOB: 09-27-1940 MRN: [id number]   Date of Service  03/20/2014  HPI/Events of Note  Hypokalemia and hypophos  eICU Interventions  Potassium and phos replaced     Intervention Category Intermediate Interventions: Electrolyte abnormality - evaluation and management  DETERDING,ELIZABETH 03/20/2014, 12:30 AM

## 2014-03-20 NOTE — Procedures (Signed)
Indication: Status epilepticus  Risks of the procedure were dicussed with the patient including post-LP headache, bleeding, infection, weakness/numbness of legs(radiculopathy), death.  The patient/patient's proxy agreed and written consent was obtained.   The patient was prepped and draped, and using sterile technique a 20 gauge quinke spinal needle was inserted into the region of approximately L4-5. The opening pressure was 39 cm H2O. Approximately 9 cc of CSF were obtained and sent for analysis.

## 2014-03-20 NOTE — Progress Notes (Signed)
ANTICOAGULATION CONSULT NOTE- follow up Pharmacy Consult for heparin Indication: chest pain/ACS  No Known Allergies  Patient Measurements: Height: 5' 0.63" (154 cm) (from data on 12/15/13) Weight: 217 lb 9.5 oz (98.7 kg) IBW/kg (Calculated) : 46.95 Heparin Dosing Weight: 90 kg  Vital Signs: Temp: 98.2 F (36.8 C) (10/04 0757) Temp Source: Axillary (10/04 0500) BP: 123/61 mmHg (10/04 0700) Pulse Rate: 71 (10/04 0700)  Labs:  Recent Labs  03/18/14 0614 03/18/14 1040  03/18/14 1710  03/19/14 0330 03/19/14 1040 03/19/14 2000 03/20/14 0535  HGB 10.6*  --   --   --   --  11.0*  --   --  10.7*  HCT 33.2*  --   --   --   --  33.7*  --   --  33.4*  PLT 233  --   --   --   --  247  --   --  234  LABPROT 14.6  --   --   --   --   --   --   --   --   INR 1.14  --   --   --   --   --   --   --   --   HEPARINUNFRC 0.81*  --   < >  --   < > 0.39 0.30  --  <0.10*  CREATININE 1.50*  --   --   --   --  1.09  --  1.01 0.88  TROPONINI >20.00* >20.00*  --  >20.00*  --   --   --   --   --   < > = values in this interval not displayed.  Estimated Creatinine Clearance: 62.6 ml/min (by C-G formula based on Cr of 0.88).   Assessment: 73 yo female on heparin for NSTEMI. HL now undetectable on heparin rate of 600 units/hr which is therapeutic. Line is running ok without incident per RN. We are targeting lower end of therapeutic range due to cannot rule out stroke.    [facility], she was continued on heparin drip which was started around midnight on 10/1.  No documentation seen in [facility] reviewed by pharmacist on 10/2  that she received a heparin bolus.  She also has been having seizures.   Goal of Therapy:  HL -0.3 - 0.5  aim for low end of goal since cannot rule out stroke Monitor platelets by anticoagulation protocol: Yes   Plan:  - Increase heparin gtt to 700 units/hr - Check an 8 hour heparin level -aim for low end of therapeutic goal and no  heparin boluses since cannot rule out stroke -daily HL, CBC -Monitor for bleeding  Lysle Pearlachel Rumbarger, PharmD, BCPS Pager # [phone] 03/20/2014 9:27 AM

## 2014-03-20 NOTE — Progress Notes (Signed)
ANTIBIOTIC CONSULT NOTE - follow up Pharmacy Consult for Ceftriaxone, Ampicillin, Acyclovir and Vancomycin Indication:  Meningitis  No Known Allergies  Patient Measurements: Height: 5' 0.63" (154 cm) (from data on 12/15/13) Weight: 217 lb 9.5 oz (98.7 kg) IBW/kg (Calculated) : 46.95   Vital Signs: Temp: 98.2 F (36.8 C) (10/04 0757) Temp Source: Axillary (10/04 0500) BP: 123/61 mmHg (10/04 0700) Pulse Rate: 71 (10/04 0700) Intake/Output from previous day: 10/03 0701 - 10/04 0700 In: 4457.6 [I.V.:2403.8; NG/GT:540; IV Piggyback:1513.8] Out: 1325 [Urine:1325] Intake/Output from this shift:    Labs:  Recent Labs  03/18/14 0614 03/19/14 0330 03/19/14 2000 03/20/14 0535  WBC 14.7* 19.1*  --  11.4*  HGB 10.6* 11.0*  --  10.7*  PLT 233 247  --  234  CREATININE 1.50* 1.09 1.01 0.88   Estimated Creatinine Clearance: 62.6 ml/min (by C-G formula based on Cr of 0.88). No results found for this basename: VANCOTROUGH, Leodis BinetVANCOPEAK, VANCORANDOM, GENTTROUGH, GENTPEAK, GENTRANDOM, TOBRATROUGH, TOBRAPEAK, TOBRARND, AMIKACINPEAK, AMIKACINTROU, AMIKACIN,  in the last 72 hours   Microbiology: Recent Results (from the past 720 hour(s))  MRSA PCR SCREENING     Status: Abnormal   Collection Time    03/18/14  3:08 AM      Result Value Ref Range Status   MRSA by PCR POSITIVE (*) NEGATIVE Final   Comment:            The GeneXpert MRSA Assay (FDA     approved for NASAL specimens     only), is one component of a     comprehensive MRSA colonization     surveillance program. It is not     intended to diagnose MRSA     infection nor to guide or     monitor treatment for     MRSA infections.     RESULT CALLED TO, READ BACK BY AND VERIFIED WITH:     Karie FetchLAUREN HOUT,RN AT [id number] 03/18/14 BY K BARR    Assessment: 73 yo obese [facility] with status epilepticus and NSTEMI.  Pharmacy consulted to begin antibiotic dosing of ampicillin, acyclovir, vancomycin and ceftriaxone  for r/o meningitis.  Today is abx day # 3;  WBC 14.7>19.1>11.4.  Creat 1.5>1.01>0.88, creat cl ~ 63 ml/min.  Tmax 99.2.  No cx data.  Plans to get LP noted.     Goal of Therapy:  Vancomycin trough level 15-20 mcg/ml  Plan - increase Ampicillin to 2 g I V q4h - continue ceftriaxone 2 g IV q12h -increase Acyclovir to  470 mg IV q8h -increase vancomycin to 1250 mg IV q12h Monitor clinical status, renal function daily.  Check vancomycin trough at steady state.  Herby AbrahamMichelle T. Bell, Pharm.D. [phone] 03/20/2014 10:55 AM

## 2014-03-21 ENCOUNTER — Inpatient Hospital Stay (HOSPITAL_COMMUNITY): Payer: Medicare Other

## 2014-03-21 DIAGNOSIS — G40301 Generalized idiopathic epilepsy and epileptic syndromes, not intractable, with status epilepticus: Secondary | ICD-10-CM

## 2014-03-21 LAB — COMPREHENSIVE METABOLIC PANEL
ALT: 58 U/L — ABNORMAL HIGH (ref 0–35)
AST: 48 U/L — ABNORMAL HIGH (ref 0–37)
Albumin: 2.1 g/dL — ABNORMAL LOW (ref 3.5–5.2)
Alkaline Phosphatase: 61 U/L (ref 39–117)
Anion gap: 11 (ref 5–15)
BUN: 11 mg/dL (ref 6–23)
CO2: 21 mEq/L (ref 19–32)
Calcium: 7.2 mg/dL — ABNORMAL LOW (ref 8.4–10.5)
Chloride: 110 mEq/L (ref 96–112)
Creatinine, Ser: 0.76 mg/dL (ref 0.50–1.10)
GFR calc Af Amer: >90 mL/min (ref >90)
GFR calc non Af Amer: 83 mL/min — ABNORMAL LOW (ref >90)
Glucose, Bld: 242 mg/dL — ABNORMAL HIGH (ref 70–99)
Potassium: 3.5 mEq/L — ABNORMAL LOW (ref 3.7–5.3)
Sodium: 142 mEq/L (ref 137–147)
Total Bilirubin: <0.2 mg/dL — ABNORMAL LOW (ref 0.3–1.2)
Total Protein: 5.7 g/dL — ABNORMAL LOW (ref 6.0–8.3)

## 2014-03-21 LAB — GLUCOSE, CAPILLARY
Glucose-Capillary: 168 mg/dL — ABNORMAL HIGH (ref 70–99)
Glucose-Capillary: 170 mg/dL — ABNORMAL HIGH (ref 70–99)
Glucose-Capillary: 174 mg/dL — ABNORMAL HIGH (ref 70–99)
Glucose-Capillary: 192 mg/dL — ABNORMAL HIGH (ref 70–99)
Glucose-Capillary: 203 mg/dL — ABNORMAL HIGH (ref 70–99)
Glucose-Capillary: 218 mg/dL — ABNORMAL HIGH (ref 70–99)
Glucose-Capillary: 223 mg/dL — ABNORMAL HIGH (ref 70–99)

## 2014-03-21 LAB — CBC
HCT: 33 % — ABNORMAL LOW (ref 36.0–46.0)
Hemoglobin: 10.8 g/dL — ABNORMAL LOW (ref 12.0–15.0)
MCH: 28.6 pg (ref 26.0–34.0)
MCHC: 32.7 g/dL (ref 30.0–36.0)
MCV: 87.5 fL (ref 78.0–100.0)
Platelets: 224 10*3/uL (ref 150–400)
RBC: 3.77 MIL/uL — ABNORMAL LOW (ref 3.87–5.11)
RDW: 14.7 % (ref 11.5–15.5)
WBC: 13.1 10*3/uL — ABNORMAL HIGH (ref 4.0–10.5)

## 2014-03-21 LAB — TRIGLYCERIDES: Triglycerides: 134 mg/dL (ref <150)

## 2014-03-21 LAB — MAGNESIUM: Magnesium: 1.5 mg/dL (ref 1.5–2.5)

## 2014-03-21 LAB — PHOSPHORUS: Phosphorus: 1.7 mg/dL — ABNORMAL LOW (ref 2.3–4.6)

## 2014-03-21 MED ORDER — ASPIRIN 325 MG PO TABS
325.0000 mg | ORAL_TABLET | Freq: Every day | ORAL | Status: DC
  Administered 2014-03-22 – 2014-03-28 (×7): 325 mg
  Filled 2014-03-21 (×8): qty 1

## 2014-03-21 MED ORDER — VITAL HIGH PROTEIN PO LIQD
1000.0000 mL | ORAL | Status: DC
  Filled 2014-03-21 (×2): qty 1000

## 2014-03-21 MED ORDER — SODIUM CHLORIDE 0.9 % IV SOLN
INTRAVENOUS | Status: DC
  Filled 2014-03-21: qty 245

## 2014-03-21 MED ORDER — FREE WATER
200.0000 mL | Freq: Three times a day (TID) | Status: DC
  Administered 2014-03-21 – 2014-03-22 (×3): 200 mL

## 2014-03-21 MED ORDER — ADULT MULTIVITAMIN LIQUID CH
5.0000 mL | Freq: Every day | ORAL | Status: DC
  Administered 2014-03-21 – 2014-04-02 (×12): 5 mL
  Filled 2014-03-21 (×13): qty 5

## 2014-03-21 MED ORDER — SODIUM CHLORIDE 0.9 % IV SOLN
400.0000 mg | Freq: Once | INTRAVENOUS | Status: AC
  Administered 2014-03-21: 400 mg via INTRAVENOUS
  Filled 2014-03-21: qty 4

## 2014-03-21 MED ORDER — VITAL HIGH PROTEIN PO LIQD
1000.0000 mL | ORAL | Status: DC
  Administered 2014-03-21 – 2014-03-23 (×3): 1000 mL
  Administered 2014-03-24 (×2)
  Administered 2014-03-24 – 2014-03-27 (×4): 1000 mL
  Administered 2014-03-28 (×2)
  Administered 2014-03-28: 1000 mL
  Filled 2014-03-21 (×10): qty 1000

## 2014-03-21 MED ORDER — TOPIRAMATE 100 MG PO TABS
200.0000 mg | ORAL_TABLET | Freq: Two times a day (BID) | ORAL | Status: DC
  Administered 2014-03-21 – 2014-03-29 (×16): 200 mg
  Filled 2014-03-21 (×19): qty 2

## 2014-03-21 MED ORDER — PRO-STAT SUGAR FREE PO LIQD
60.0000 mL | Freq: Three times a day (TID) | ORAL | Status: DC
  Filled 2014-03-21 (×2): qty 60

## 2014-03-21 MED ORDER — POTASSIUM CHLORIDE 20 MEQ/15ML (10%) PO LIQD
40.0000 meq | Freq: Two times a day (BID) | ORAL | Status: AC
  Administered 2014-03-21 (×2): 40 meq
  Filled 2014-03-21 (×2): qty 30

## 2014-03-21 MED ORDER — INSULIN GLARGINE 100 UNIT/ML ~~LOC~~ SOLN
10.0000 [IU] | Freq: Every day | SUBCUTANEOUS | Status: DC
  Administered 2014-03-21 – 2014-03-22 (×2): 10 [IU] via SUBCUTANEOUS
  Filled 2014-03-21 (×2): qty 0.1

## 2014-03-21 MED ORDER — SODIUM CHLORIDE 0.9 % IV SOLN
1.0000 mg/kg/h | INTRAVENOUS | Status: DC
  Administered 2014-03-21: 1 mg/kg/h via INTRAVENOUS
  Filled 2014-03-21 (×3): qty 2

## 2014-03-21 MED ORDER — SODIUM CHLORIDE 0.9 % IV SOLN
1.0000 mg/kg/h | INTRAVENOUS | Status: DC
  Administered 2014-03-21 – 2014-03-22 (×3): 1 mg/kg/h via INTRAVENOUS
  Filled 2014-03-21 (×3): qty 5

## 2014-03-21 MED ORDER — ATORVASTATIN CALCIUM 80 MG PO TABS
80.0000 mg | ORAL_TABLET | Freq: Every day | ORAL | Status: DC
  Administered 2014-03-21 – 2014-04-02 (×12): 80 mg
  Filled 2014-03-21 (×15): qty 1

## 2014-03-21 MED ORDER — PRO-STAT SUGAR FREE PO LIQD
30.0000 mL | Freq: Two times a day (BID) | ORAL | Status: DC
  Administered 2014-03-21 – 2014-03-24 (×7): 30 mL
  Filled 2014-03-21 (×8): qty 30

## 2014-03-21 NOTE — Progress Notes (Signed)
LTM day 4 started.

## 2014-03-21 NOTE — Progress Notes (Signed)
RT was unable to obtain ABG due to fluid and puffiness of pts upper extremities.  Dr. Sung AmabileSimonds made aware.

## 2014-03-21 NOTE — Progress Notes (Signed)
An hour after decreasing propofol from to , pt is tachypneic in 30s, respirations labored. Discussed with neurology and CCM. Obtained order to return to propofol.

## 2014-03-21 NOTE — Progress Notes (Signed)
eLink Physician-Brief Progress Note Patient Name: Sharyn DrossHarriett Kelliher DOB: Oct 20, 1940 MRN: [id number]   Date of Service  03/21/2014  HPI/Events of Note  febrile  eICU Interventions  One dose caldolor given iv      Intervention Category Intermediate Interventions: Other:  Shan LevansPatrick Wright 03/21/2014, 6:59 PM

## 2014-03-21 NOTE — Progress Notes (Addendum)
Subjective: EEG with less epileptiform activity.  Exam: Filed Vitals:   03/21/14 1010  BP: 108/45  Pulse: 88  Temp:   Resp: 30   Gen: in bed, intubated MS: does not open eyes or follow commands.  CN: PERRL, eyes midline.  Motor: flexion vs withdrawal in all four extremities.  Sensory: intact to LT  EEG: Bursts with less epileptiform activity than previous.   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 She had clinical correlate fo right arm twitchign with left posterior recurrent runs of sharply contoured activity. I feel that this was consistent with status epilepticus. She has failed 4 AEDs and have proceeded to burst suppression.  The etiology of her SE is unclear. Possibilities include new ischemic stroke, PRES, infection, underlying predisposition. The focal nature argues against a genreliaed metabolic etiology such as EtOH withdrawal.  With improved EEG, will begin trying to wean  Recommendations: 1) Continue dilantin, keppra, vimpat 2) repeat CT head 3) with no greater elevation in WBCs, will discontinue anti-bacterials, continue acyclovir until HSV by PCR returns.  4) begin sedative weaning.     Ritta SlotMcNeill Kirkpatrick, MD [facility] [phone]  If 7pm- 7am, please page neurology on call as listed in AMION. 03/21/2014  10:48 AM

## 2014-03-21 NOTE — Progress Notes (Signed)
Patient Name: Brooke DrossHarriett Gross Date of Encounter: 03/21/2014  Principal Problem:   Status epilepticus Active Problems:   NSTEMI, initial episode of care   Diabetes    Patient Profile: 73 yo female w/ bipolar DO, DM, CKD, HLD, obesity, was admitted 10/02 early am w/ status epilepticus and NSTEMI.   SUBJECTIVE: This am, pt resp rate & HR increasing, coughing some, no movements of extremities or head. DA d/c'd this am, pt started on Levophed for BP support.  OBJECTIVE Filed Vitals:   03/21/14 0800 03/21/14 0900 03/21/14 1000 03/21/14 1010  BP:  116/46 109/51 108/45  Pulse:  84 87 88  Temp: 98.4 F (36.9 C)     TempSrc: Axillary     Resp:  14 29 30   Height:      Weight:      SpO2:  96% 95% 93%    Intake/Output Summary (Last 24 hours) at 03/21/14 1049 Last data filed at 03/21/14 0935  Gross per 24 hour  Intake 4071.28 ml  Output   3050 ml  Net 1021.28 ml   Filed Weights   03/19/14 0351 03/20/14 0500 03/21/14 0500  Weight: 207 lb 10.8 oz (94.2 kg) 217 lb 9.5 oz (98.7 kg) 219 lb 12.8 oz (99.7 kg)    PHYSICAL EXAM General: Well developed, well nourished, female sedated on the vent. Head: Normocephalic, atraumatic.  Neck: Supple without bruits, JVD slightly elevated. Lungs:  Resp regular and unlabored, some rales bases. Heart: RRR, S1, S2, no S3, S4, or murmur; no rub. Abdomen: Soft, non-tender, non-distended, BS + x 4.  Extremities: No clubbing, cyanosis, no edema.   LABS: CBC: Recent Labs  03/20/14 0535 03/21/14 0313  WBC 11.4* 13.1*  HGB 10.7* 10.8*  HCT 33.4* 33.0*  MCV 88.1 87.5  PLT 234 224   Basic Metabolic Panel: Recent Labs  03/20/14 0535 03/20/14 1742 03/21/14 0313  NA 145  --  142  K 3.3*  --  3.5*  CL 112  --  110  CO2 22  --  21  GLUCOSE 255*  --  242*  BUN 15  --  11  CREATININE 0.88  --  0.76  CALCIUM 6.5* 7.1* 7.2*  MG 1.5  --  1.5  PHOS 1.9*  --  1.7*   Liver Function Tests: Recent Labs  03/21/14 0313  AST 48*  ALT 58*   ALKPHOS 61  BILITOT <0.2*  PROT 5.7*  ALBUMIN 2.1*   Cardiac Enzymes: Recent Labs  03/18/14 1710 03/20/14 1030  TROPONINI >20.00* 7.08*   Fasting Lipid Panel: Recent Labs  03/21/14 0314  TRIG 134   CSF Results:  Glucose 123, Total Protein 30 , RBC Count 3 , WBC 8, Segmented Neutrophils 30, Lymphs 52, Monocyte-Macrophage-Spinal Fluid 18,  Appearance CLEAR,  Color COLORLESS,  Supernatant NOT INDICATED  TELE:   SR, ST     Echo: 03/18/2014 Left ventricle: LVEF is approximately 50 to 55% with inferior/inferoseptal hypokinesis. The cavity size was normal. Wall thickness was normal. Doppler parameters are consistent with abnormal left ventricular relaxation (grade 1 diastolic dysfunction).  Radiology/Studies: Ct Head Wo Contrast 03/20/2014   CLINICAL DATA:  Status epilepticus. History of bipolar 1 disorder, diabetes, and migraine.  EXAM: CT HEAD WITHOUT CONTRAST  TECHNIQUE: Contiguous axial images were obtained from the base of the skull through the vertex without contrast.  COMPARISON:  [facility] of [facility] head [facility]  FINDINGS: No evidence for acute infarction, hemorrhage, mass lesion, hydrocephalus, or extra-axial  fluid. Generalized atrophy. Hypoattenuation of the white matter consistent with previously identified small vessel disease. Calvarium intact. Vascular calcification. No acute sinus disease. Trace layering effusion in the LEFT mastoid. Slight RIGHT frontal scalp soft tissue swelling was present on the previous MRI, and could represent a chronic inflammatory process.  IMPRESSION: Chronic changes as described similar to previous MR. no acute intracranial abnormality is detected.   Electronically Signed   By: Davonna Belling M.D.   On: 03/20/2014 09:19   Dg Chest Port 1 View 03/21/2014   CLINICAL DATA:  Atelectasis.  EXAM: PORTABLE CHEST - 1 VIEW  COMPARISON:  03/20/2014.  FINDINGS: Endotracheal tube terminates approximately 2.4 cm above the carina.  Nasogastric tube is followed into the stomach. Right IJ central line tip projects over the SVC. Heart size stable. Slight worsening in patchy bilateral airspace disease. Small bilateral effusions.  IMPRESSION: 1. Slight worsening in patchy bilateral airspace disease, suspicious for pulmonary edema. 2. Small bilateral pleural effusions. 3. Mild bibasilar atelectasis.   Electronically Signed   By: Leanna Battles M.D.   On: 03/21/2014 07:42   Dg Chest Port 1 View 03/20/2014   CLINICAL DATA:  Assess endotracheal tube placement.  EXAM: PORTABLE CHEST - 1 VIEW  COMPARISON:  March 19, 2014  FINDINGS: The lungs are adequately inflated. Left lower lobe atelectasis persists. The left hemidiaphragm remains partially obscured. The cardiac silhouette is top-normal in size. The pulmonary vascularity is not engorged.  The endotracheal tube tip lies 4.8 cm above the crotch of the carina. The esophagogastric tube tip and proximal port lie below the level of the GE junction. The right internal jugular venous catheter tip overlies the midportion of the SVC.  IMPRESSION: There is a stable appearance of the chest since yesterday's study. The support tubes and lines are in appropriate position radiographically.   Electronically Signed   By: David  [place]   On: 03/20/2014 06:48    Current Medications:  . acyclovir  10 mg/kg (Ideal) Intravenous Q8H  . ampicillin (OMNIPEN) IV  2 g Intravenous Q4H  . antiseptic oral rinse  7 mL Mouth Rinse QID  . artificial tears   Both Eyes 3 times per day  . aspirin  325 mg Per Tube Daily  . atorvastatin  80 mg Per Tube q1800  . cefTRIAXone (ROCEPHIN)  IV  2 g Intravenous Q12H  . chlorhexidine  15 mL Mouth Rinse BID  . Chlorhexidine Gluconate Cloth  6 each Topical Q0600  . feeding supplement (VITAL HIGH PROTEIN)  1,000 mL Per Tube Q24H  . free water  200 mL Per Tube 3 times per day  . insulin aspart  0-15 Units Subcutaneous 6 times per day  . insulin glargine  10 Units Subcutaneous  Daily  . lacosamide (VIMPAT) IV  100 mg Intravenous Q12H  . levETIRAcetam  1,500 mg Intravenous Q12H  . midazolam  20 mg Intravenous Once  . mupirocin ointment  1 application Nasal BID  . pantoprazole sodium  40 mg Per Tube QHS  . phenytoin (DILANTIN) IV  100 mg Intravenous 3 times per day  . potassium chloride  40 mEq Per Tube BID  . topiramate  200 mg Per Tube BID  . vancomycin  1,250 mg Intravenous Q12H   . ketamine (KETALAR) IV infusion 2mg /mL    . midazolam (VERSED) infusion 20 mg/hr (03/21/14 0900)  . norepinephrine (LEVOPHED) Adult infusion 10 mcg/min (03/21/14 1030)  . propofol 50 mcg/kg/min (03/21/14 0935)    ASSESSMENT AND PLAN: Principal Problem:  Status epilepticus - per CCM, Neuro, has had LP, Results above  Active Problems:   NSTEMI, initial episode of care - EF preserved by echo (results above) but w/ WMA. On ASA, has not been on BB due to hypotension, no statin due to abnl LFTs. Med changes per MD. Consider ischemic eval once she recovers.     Diabetes - per CCM  Signed, Theodore Demark , PA-C 10:49 AM 03/21/2014  Patient seen, examined. Available data reviewed. Agree with findings, assessment, and plan as outlined by Theodore Demark, PA-C. Exam reveals intubated, sedated patient. Lungs CTA. Heart tachy and regular without murmur. No significant edema. Labs reviewed, troponin trending down. Most recent EKG shows resolution of inferolateral ST depression. Pt on ASA and atorvastatin. She is not currently a candidate for beta-blockade as she is still requiring vasopressor Rx. Recommend check CVP as central line in place to help with volume management. Add plavix if ok with other teams. Otherwise continue supportive care and consider cath pending neuro status/recovery.  Tonny Bollman, M.D. 03/21/2014 12:33 PM

## 2014-03-21 NOTE — Progress Notes (Addendum)
NUTRITION FOLLOW-UP/CONSULT  DOCUMENTATION CODES Per approved criteria  -Obesity Unspecified   INTERVENTION:  Decrease Vital High Protein @ 35 ml/hr via OG tube.  30 ml Prostat BID.  Tube feeding regimen provides 1040 kcal (68% of needs), 103 grams of protein, and 702 ml of H2O.   NUTRITION DIAGNOSIS: Inadequate oral intake related to inability to eat as evidenced by NPO status; ongoing.   Goal: Enteral nutrition to provide 60-70% of estimated calorie needs (22-25 kcals/kg ideal body weight) and 100% of estimated protein needs, based on ASPEN guidelines for permissive underfeeding in critically ill obese individuals, not met.   Monitor:  Respiratory status, TF tolerance, labs  ASSESSMENT: Pt transferred from [facility] with status epilepticus and NSTEMI. Pt remains intubated.   Patient is currently intubated on ventilator support MV: 9.1 L/min Temp (24hrs), Avg:97.8 F (36.6 C), Min:97.3 F (36.3 C), Max:98.4 F (36.9 C)  Propofol: 13.6 ml/hr providing 359 kcal from lipid per day.  Pt has required high doses of propofol, up to 50 mcg/kg/min  Per RN she is starting pt on a new sedative and turning off Propofol.   Labs: Blood glucose: 170-242 Potassium and Phosphorus low Magnesium WNL  Pt with OG tube. Vital High Protein is infusing @ 40 ml/hr. Tube feeding regimen currently providing 960 kcal, 84 grams protein, and 802 ml H2O.   Height: Ht Readings from Last 1 Encounters:  03/18/14 5' 0.63" (1.54 m)    Weight: Wt Readings from Last 1 Encounters:  03/21/14 219 lb 12.8 oz (99.7 kg)  Admission weight: 200 lb (90.8 kg); Admission BMI 38.44  BMI:  Body mass index is 42.04 kg/(m^2).  Estimated Nutritional Needs: Kcal: 1531 Protein: >/= 90 grams Fluid: > 1.5 L/day  Skin: intact  Diet Order: NPO   Intake/Output Summary (Last 24 hours) at 03/21/14 0921 Last data filed at 03/21/14 0700  Gross per 24 hour  Intake 3848.68 ml  Output   2750 ml  Net 1098.68 ml     Last BM: PTA    Labs:   Recent Labs Lab 03/19/14 0330 03/19/14 2000 03/20/14 0535 03/20/14 1742 03/21/14 0313  NA 144 145 145  --  142  K 3.3* 3.3* 3.3*  --  3.5*  CL 110 112 112  --  110  CO2 $Re'24 21 22  'WXH$ --  21  BUN $Re'22 16 15  'nwi$ --  11  CREATININE 1.09 1.01 0.88  --  0.76  CALCIUM 7.2* 6.7* 6.5* 7.1* 7.2*  MG 1.9  --  1.5  --  1.5  PHOS 1.2* 2.2* 1.9*  --  1.7*  GLUCOSE 158* 125* 255*  --  242*    CBG (last 3)   Recent Labs  03/21/14 0001 03/21/14 0338 03/21/14 0800  GLUCAP 170* 223* 174*    Scheduled Meds: . acyclovir  10 mg/kg (Ideal) Intravenous Q8H  . ampicillin (OMNIPEN) IV  2 g Intravenous Q4H  . antiseptic oral rinse  7 mL Mouth Rinse QID  . artificial tears   Both Eyes 3 times per day  . aspirin  300 mg Rectal Daily  . atorvastatin  80 mg Oral q1800  . cefTRIAXone (ROCEPHIN)  IV  2 g Intravenous Q12H  . chlorhexidine  15 mL Mouth Rinse BID  . Chlorhexidine Gluconate Cloth  6 each Topical Q0600  . feeding supplement (VITAL HIGH PROTEIN)  1,000 mL Per Tube Q24H  . insulin aspart  0-15 Units Subcutaneous 6 times per day  . lacosamide (VIMPAT) IV  100  mg Intravenous Q12H  . levETIRAcetam  1,500 mg Intravenous Q12H  . midazolam  20 mg Intravenous Once  . mupirocin ointment  1 application Nasal BID  . pantoprazole sodium  40 mg Per Tube QHS  . phenytoin (DILANTIN) IV  100 mg Intravenous 3 times per day  . topiramate  200 mg Oral BID  . vancomycin  1,250 mg Intravenous Q12H    Continuous Infusions: . sodium chloride 100 mL/hr at 03/20/14 0234  . DOPamine 20 mcg/kg/min (03/21/14 0700)  . midazolam (VERSED) infusion 20 mg/hr (03/21/14 0834)  . norepinephrine (LEVOPHED) Adult infusion 1 mcg/min (03/20/14 1500)  . propofol 50 mcg/kg/min (03/21/14 0828)    [place], [place], [place] Pager [phone] After Hours Pager

## 2014-03-21 NOTE — Progress Notes (Signed)
PULMONARY / CRITICAL CARE MEDICINE   Name: Brooke Gross MRN: [id number] DOB: [birth year]    ADMISSION DATE:  03/18/2014  [facility]  CHIEF COMPLAINT:  Seizure  INITIAL PRESENTATION:  73 [facility] and [facility].  Intubated for airway protection.  PCCM asked to admit to ICU.  SIGNIFICANT EVENTS/STUDIES:  10/02 [facility] intubated shortly after arrival. Dopamine initiated for hypotension. Vanc, acyclovir, ceftriaxone and ampicillin initiated empirically 10/02 CT head [facility] no acute abn 10/02 Neuro consult: Etiology of seizure activity is unclear. Acute ischemic cerebral infarction cannot be ruled out.  Recommendations: Continue Keppra IV at 1000 mg every 12 hours. Continue Vimpat at 100 mg every 12 hours. Propofol IV for sedation as well as as seizure control. EEG to rule out continued subclinical seizure activity. MRI of the brain without contrast to rule out acute cerebral infarction. 10/02 Cards consult: Her biomarkers suggest a fairly large MI and ECG has many high risk features. 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 ASA, statin, heparin initiated. Beta blocker not option due to hypotension.  10/02 TTE:  LVEF is approximately 50 to 55% with inferior/inferoseptal hypokinesis, grade 1 diastolic dysfx 10/03 No WUA. Remained on dopamine 10/03 Persistent seizure-like activity despite mutliple anti-consvulsants. Burst suppression initiated. LP recommended to R/O infectious etiology 10/03 cont EEG: Burst suppression, asymmetric, with greater suppression on the right. Semi-periodic epileptiform discharges, left hemisphere, maximal over the mid/posterior temporal region. The findings indicate a severe diffuse encephalopathy with epileptogenic potential noted over the left hemisphere, particularly over the left posterior temporal-occipital region. No ongoing seizure activity was observed that would explain an altered  mental status 10/04 Remained in medically induced coma and on dopamine.  10/04 LP performed by Neuro. CSF: glu 123, protein 30, RBC 3, WBC 8, 52% lymphs 10/05 Unable to tolerated decrease in propofol due to tachypnea. Ketamine initiated. Propofol weaned to off. DA transitioned to NE. Anti-bacterial abx discontinued. Acyclovir continued 10/05 CT head: no acute abn  INDWELLING DEVICES:: ETT 10/02 >>  R IJ CVL 10/02 >>   MICRO DATA: MRSA PCR 10/02 >> POS CSF 10/04 >>   ANTIMICROBIALS:  Vanc 1002 >> 10/05 Ceftriaxone1002 >> 10/05 ampicillin1002 >> 10/05 acyclovir1002 >>    SUBJECTIVE:  Remains in medically induced coma and on dopamine.  VITAL SIGNS: Temp:  [97.7 F (36.5 C)-98.4 F (36.9 C)] 98.4 F (36.9 C) (10/05 0800) Pulse Rate:  [72-121] 119 (10/05 1130) Resp:  [0-31] 29 (10/05 1130) BP: (97-148)/(42-90) 133/55 mmHg (10/05 1130) SpO2:  [93 %-100 %] 98 % (10/05 1130) FiO2 (%):  [40 %] 40 % (10/05 1540) Weight:  [99.7 kg (219 lb 12.8 oz)] 99.7 kg (219 lb 12.8 oz) (10/05 0500) VENTILATOR SETTINGS: Vent Mode:  [-] PRVC FiO2 (%):  [40 %] 40 % Set Rate:  [14 bmp] 14 bmp Vt Set:  [460 mL] 460 mL PEEP:  [5 cmH20] 5 cmH20 Plateau Pressure:  [20 cmH20-27 cmH20] 20 cmH20 INTAKE / OUTPUT:  Intake/Output Summary (Last 24 hours) at 03/21/14 1601 Last data filed at 03/21/14 1200  Gross per 24 hour  Intake 3508.69 ml  Output   2750 ml  Net 758.69 ml    PHYSICAL EXAMINATION: General: no distress Neuro: RASS -5 HEENT: pupils reactive Rt > Lt Cardiovascular:  Regular, no murmur Lungs:  Scattered rhonchi Abdomen:  Soft, non tender, decreased  bowel sounds Musculoskeletal:  No edema Skin:  No rashes  LABS: I have reviewed all of today's lab  results. Relevant abnormalities are discussed in the A/P section  CXR: mod edema pattern  ASSESSMENT / PLAN:  PULMONARY A: Acute respiratory failure 2nd to inability to protect airway Pulm edema P:   Cont full vent support -  settings reviewed and/or adjusted Cont vent bundle Daily SBT if/when meets criteria No diuresis until off vasopressors  CARDIOVASCULAR A:  NSTEMI Hx of HTN, HLD. Hypotension - likely sedation related P:  Cards following Transition from DA to NE Transition off propofol to ketamine (should reduce vasopressor need)  RENAL A:   CKD Hypokalemia, resolved Hypophosphatemia,  P:   Monitor BMET intermittently Monitor I/Os Correct electrolytes as indicated  GASTROINTESTINAL A:   No issues P:   SUP: enteral PPI Begin TFs 10/05  HEMATOLOGIC A:   Mild ICU associated anemia P:  DVT px: SQ heparin Monitor CBC intermittently Transfuse per usual ICU guidelines noting recent NSTEMI  INFECTIOUS A:   Concern for meningoencephalitis - doubt bacterial P:   Micro and abx as above  ENDOCRINE A:   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   P:   Cont SSI Add Lantus 10/05  NEUROLOGIC A:   Status epilepticus. Hx of Bipolar, essential tremor, anxiety, Migraine, Vertigo. P:   RASS goal: -4 AED's per neurology Transition propofol to ketamine per Neuro recs Cont EEG per Neuro   Family updated:    Interdisciplinary Family Meeting v Palliative Care Meeting:   D/w Dr. Amada Jupiter  CC time 40 minutes.  Billy Fischer, MD ; [facility] [phone].  After 5:30 PM or weekends, call [phone]

## 2014-03-22 ENCOUNTER — Inpatient Hospital Stay (HOSPITAL_COMMUNITY): Payer: Medicare Other

## 2014-03-22 LAB — PHENYTOIN LEVEL, TOTAL: Phenytoin Lvl: 9.5 ug/mL — ABNORMAL LOW (ref 10.0–20.0)

## 2014-03-22 LAB — BASIC METABOLIC PANEL
Anion gap: 12 (ref 5–15)
BUN: 14 mg/dL (ref 6–23)
CO2: 20 mEq/L (ref 19–32)
Calcium: 6.8 mg/dL — ABNORMAL LOW (ref 8.4–10.5)
Chloride: 113 mEq/L — ABNORMAL HIGH (ref 96–112)
Creatinine, Ser: 0.9 mg/dL (ref 0.50–1.10)
GFR calc Af Amer: 73 mL/min — ABNORMAL LOW (ref >90)
GFR calc non Af Amer: 63 mL/min — ABNORMAL LOW (ref >90)
Glucose, Bld: 231 mg/dL — ABNORMAL HIGH (ref 70–99)
Potassium: 3.5 mEq/L — ABNORMAL LOW (ref 3.7–5.3)
Sodium: 145 mEq/L (ref 137–147)

## 2014-03-22 LAB — GLUCOSE, CAPILLARY
Glucose-Capillary: 222 mg/dL — ABNORMAL HIGH (ref 70–99)
Glucose-Capillary: 199 mg/dL — ABNORMAL HIGH (ref 70–99)
Glucose-Capillary: 214 mg/dL — ABNORMAL HIGH (ref 70–99)
Glucose-Capillary: 206 mg/dL — ABNORMAL HIGH (ref 70–99)
Glucose-Capillary: 205 mg/dL — ABNORMAL HIGH (ref 70–99)
Glucose-Capillary: 207 mg/dL — ABNORMAL HIGH (ref 70–99)

## 2014-03-22 LAB — HERPES SIMPLEX VIRUS(HSV) DNA BY PCR
HSV 1 DNA: NOT DETECTED
HSV 2 DNA: NOT DETECTED

## 2014-03-22 LAB — MAGNESIUM: Magnesium: 1.1 mg/dL — ABNORMAL LOW (ref 1.5–2.5)

## 2014-03-22 LAB — CLOSTRIDIUM DIFFICILE BY PCR: Toxigenic C. Difficile by PCR: NEGATIVE

## 2014-03-22 MED ORDER — FREE WATER
200.0000 mL | Status: DC
  Administered 2014-03-22 – 2014-03-24 (×12): 200 mL

## 2014-03-22 MED ORDER — INSULIN GLARGINE 100 UNIT/ML ~~LOC~~ SOLN
20.0000 [IU] | Freq: Every day | SUBCUTANEOUS | Status: DC
  Administered 2014-03-23: 20 [IU] via SUBCUTANEOUS
  Filled 2014-03-22: qty 0.2

## 2014-03-22 MED ORDER — POTASSIUM CHLORIDE 20 MEQ/15ML (10%) PO LIQD
40.0000 meq | Freq: Two times a day (BID) | ORAL | Status: AC
  Administered 2014-03-22 (×2): 40 meq
  Filled 2014-03-22 (×2): qty 30

## 2014-03-22 MED ORDER — MAGNESIUM SULFATE 40 MG/ML IJ SOLN
2.0000 g | Freq: Once | INTRAMUSCULAR | Status: AC
  Administered 2014-03-22: 2 g via INTRAVENOUS
  Filled 2014-03-22: qty 50

## 2014-03-22 MED ORDER — ACETAMINOPHEN 160 MG/5ML PO SOLN
650.0000 mg | Freq: Four times a day (QID) | ORAL | Status: DC | PRN
  Administered 2014-03-22 – 2014-04-02 (×7): 650 mg
  Filled 2014-03-22 (×6): qty 20.3

## 2014-03-22 NOTE — Progress Notes (Addendum)
RT note- Dr. Sung AmabileSimonds increased VT to 550ml

## 2014-03-22 NOTE — Progress Notes (Signed)
Patient Name: Brooke Gross Date of Encounter: 03/22/2014  Principal Problem:   Status epilepticus Active Problems:   NSTEMI, initial episode of care   Diabetes    Patient Profile: 73 yo female w/ bipolar DO, DM, CKD, HLD, obesity, was admitted 10/02 early am w/ status epilepticus and NSTEMI   SUBJECTIVE: Intubated,   OBJECTIVE Filed Vitals:   03/22/14 0600 03/22/14 0700 03/22/14 0750 03/22/14 0800  BP: 121/50 133/56    Pulse: 91 94 95   Temp:    100.2 F (37.9 C)  TempSrc:    Axillary  Resp: 30 33 13   Height:      Weight:      SpO2: 100% 100% 98%     Intake/Output Summary (Last 24 hours) at 03/22/14 1020 Last data filed at 03/22/14 0800  Gross per 24 hour  Intake 3896.39 ml  Output   1325 ml  Net 2571.39 ml   Filed Weights   03/20/14 0500 03/21/14 0500 03/22/14 0104  Weight: 217 lb 9.5 oz (98.7 kg) 219 lb 12.8 oz (99.7 kg) 224 lb 10.4 oz (101.9 kg)    PHYSICAL EXAM General: Well developed, well nourished, female in no acute distress. Head: Normocephalic, atraumatic.  Neck: Supple without bruits, JVD. Lungs:  Resp regular and unlabored, CTA. Heart: RRR, S1, S2, no S3, S4, or murmur; no rub. Abdomen: Soft, non-tender, non-distended, BS + x 4.  Extremities: No clubbing, cyanosis, edema.  Neuro: Alert and oriented X 3. Moves all extremities spontaneously. Psych: Normal affect.  LABS: CBC: Recent Labs  03/20/14 0535 03/21/14 0313  WBC 11.4* 13.1*  HGB 10.7* 10.8*  HCT 33.4* 33.0*  MCV 88.1 87.5  PLT 234 224   INR:No results found for this basename: INR,  in the last 72 hours Basic Metabolic Panel: Recent Labs  03/20/14 0535  03/21/14 0313 03/22/14 0531 03/22/14 0850  NA 145  --  142 145  --   K 3.3*  --  3.5* 3.5*  --   CL 112  --  110 113*  --   CO2 22  --  21 20  --   GLUCOSE 255*  --  242* 231*  --   BUN 15  --  11 14  --   CREATININE 0.88  --  0.76 0.90  --   CALCIUM 6.5*  < > 7.2* 6.8*  --   MG 1.5  --  1.5  --  1.1*  PHOS  1.9*  --  1.7*  --   --   < > = values in this interval not displayed. Liver Function Tests: Recent Labs  03/21/14 0313  AST 48*  ALT 58*  ALKPHOS 61  BILITOT <0.2*  PROT 5.7*  ALBUMIN 2.1*   Cardiac Enzymes: Recent Labs  03/20/14 1030  TROPONINI 7.08*   Fasting Lipid Panel: Recent Labs  03/21/14 0314  TRIG 134   TELE: SR, No  ectopy     Radiology/Studies: Ct Head Wo Contrast 03/21/2014   CLINICAL DATA:  Seizure  EXAM: CT HEAD WITHOUT CONTRAST  TECHNIQUE: Contiguous axial images were obtained from the base of the skull through the vertex without intravenous contrast.  COMPARISON:  03/20/2014  FINDINGS: Ventricle size is normal. Mild chronic microvascular ischemic change in the white matter.  Negative for acute infarct. Negative for hemorrhage or mass. Calvarium intact.  IMPRESSION: No acute abnormality.   Electronically Signed   By: Marlan Palau M.D.   On: 03/21/2014 12:27   Dg  Chest Port 1 View 03/22/2014   CLINICAL DATA:  Respiratory failure.  Follow-up.  EXAM: PORTABLE CHEST - 1 VIEW  COMPARISON:  03/21/2014 .  FINDINGS: Endotracheal tube and NG tube in good anatomic position. Right IJ line in good anatomic position. Stable cardiomegaly. Progressive bilateral pulmonary infiltrates. These findings could be secondary to pneumonia, pulmonary edema, and/or ARDS. Tiny left pleural effusion cannot be excluded. No pneumothorax. No acute osseous abnormality.  IMPRESSION: 1. Lines and tubes in stable position. 2. Progressive bilateral pulmonary infiltrates. This could be secondary to pneumonia, pulmonary edema, and/or ARDS. 3. Tiny left pleural effusion cannot be excluded.   Electronically Signed   By: Maisie Fus  Register   On: 03/22/2014 07:53   Dg Chest Port 1 View 03/21/2014   CLINICAL DATA:  Atelectasis.  EXAM: PORTABLE CHEST - 1 VIEW  COMPARISON:  03/20/2014.  FINDINGS: Endotracheal tube terminates approximately 2.4 cm above the carina. Nasogastric tube is followed into the stomach.  Right IJ central line tip projects over the SVC. Heart size stable. Slight worsening in patchy bilateral airspace disease. Small bilateral effusions.  IMPRESSION: 1. Slight worsening in patchy bilateral airspace disease, suspicious for pulmonary edema. 2. Small bilateral pleural effusions. 3. Mild bibasilar atelectasis.   Electronically Signed   By: Leanna Battles M.D.   On: 03/21/2014 07:42    Current Medications:  . acyclovir  10 mg/kg (Ideal) Intravenous Q8H  . antiseptic oral rinse  7 mL Mouth Rinse QID  . artificial tears   Both Eyes 3 times per day  . aspirin  325 mg Per Tube Daily  . atorvastatin  80 mg Per Tube q1800  . chlorhexidine  15 mL Mouth Rinse BID  . feeding supplement (PRO-STAT SUGAR FREE 64)  30 mL Per Tube BID  . feeding supplement (VITAL HIGH PROTEIN)  1,000 mL Per Tube Q24H  . free water  200 mL Per Tube 3 times per day  . insulin aspart  0-15 Units Subcutaneous 6 times per day  . insulin glargine  10 Units Subcutaneous Daily  . lacosamide (VIMPAT) IV  100 mg Intravenous Q12H  . levETIRAcetam  1,500 mg Intravenous Q12H  . midazolam  20 mg Intravenous Once  . multivitamin  5 mL Per Tube Daily  . mupirocin ointment  1 application Nasal BID  . pantoprazole sodium  40 mg Per Tube QHS  . phenytoin (DILANTIN) IV  100 mg Intravenous 3 times per day  . topiramate  200 mg Per Tube BID   . midazolam (VERSED) infusion 10 mg/hr (03/22/14 0800)  . norepinephrine (LEVOPHED) Adult infusion 6 mcg/min (03/22/14 0851)  . propofol Stopped (03/21/14 1400)    ASSESSMENT AND PLAN: Principal Problem:   Status epilepticus- per CCM, Neuro, has had LP, infection felt unlikely, MRI planned, possibly in am  Active Problems:   NSTEMI, initial episode of care- EF preserved by echo but w/inferior/inferoseptal hypokinesis. On ASA and statin, has not been on BB due to hypotension, no statin due to abnl LFTs. Med changes per MD. Consider ischemic eval once she recovers.     Diabetes - per  CCM    Volume overload - CXR worsening, but pt still on pressors, see CXR results above. I/O + by > 11,000 ml since admit. Diuesis per MD. CCM plans to initiate once she is off pressors.  Signed, Theodore Demark , PA-C 10:20 AM 03/22/2014   History and all data above reviewed.  Patient examined.  I agree with the findings as above.  The  patient exam reveals COR:RRR  ,  Lungs: Decreased breath sounds  ,  Abd: Positive bowel sounds, no rebound no guarding decreased, Ext Diffuse edema  .  All available labs, radiology testing, previous records reviewed. Agree with documented assessment and plan. NSTEMI :  Medical management for now.  Continue ASA, Lipitor.  Further work up deferred pending improvement/resolution of other acute issues.  Fayrene FearingJames Hochrein  12:02 PM  03/22/2014

## 2014-03-22 NOTE — Progress Notes (Signed)
eLink Physician-Brief Progress Note Patient Name: Brooke DrossHarriett Hathaway DOB: [birth year] MRN: [id number]   Date of Service  03/22/2014  HPI/Events of Note  Fever  eICU Interventions  Tylenol        YACOUB,WESAM 03/22/2014, 4:04 PM

## 2014-03-22 NOTE — Progress Notes (Signed)
Subjective: EEG much improved, tolerating wean.   Exam: Filed Vitals:   03/22/14 0800  BP:   Pulse:   Temp: 100.2 F (37.9 C)  Resp:    Gen: in bed, intubated MS: does not open eyes or follow commands.  CN: PERRL, eyes midline.  Motor: flexion vs withdrawal in all four extremities.  Sensory: intact to LT  EEG: Background appears much improved, now with some structures resembling sleep.   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 She had clinical correlate fo right arm twitching with left posterior recurrent runs of sharply contoured activity. 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 She has failed 4 AEDs and therefore proceeded to burst suppression.  The etiology of her SE is unclear. Possibilities include new ischemic stroke, PRES, infection, underlying predisposition. The focal nature argues against a generalized metabolic etiology such as EtOH withdrawal.  Of note, with the combination of AEDs used, it may take some time for the patient to improve. Would wean this combination prior to making any long term care decisions(e.g. Withdrawal of care) as her EEG is improved markedly and there is no clear etiology that would be suggestive of poor prognosis. Starting at a dose of 200mg  BID of topamax can make a well person encephalopathic even without AEDs.   Recommendations: 1) Continue dilantin, keppra, vimpat, topamax for now, would likely wean if patient is not fast to wake up.  2) will finish weaning IV drips for AED purposes today, following this would use only for sedative purposes.  3) If no further seizures, likely MRI tomorrow.    Ritta SlotMcNeill Kirkpatrick, MD [facility] [phone]  If 7pm- 7am, please page neurology on call as listed in AMION. 03/22/2014  8:59 AM

## 2014-03-22 NOTE — Progress Notes (Signed)
PULMONARY / CRITICAL CARE MEDICINE   Name: Brooke Gross MRN: [id number] DOB: December 30, 1940    ADMISSION DATE:  03/18/2014  REFERRING MD :  [facility]  CHIEF COMPLAINT:  Seizure  INITIAL PRESENTATION:  73 yo female transferred from [facility] with status epilepticus and NSTEMI.  Intubated for airway protection.  PCCM asked to admit to ICU.  SIGNIFICANT EVENTS/STUDIES:  10/02 Transfer to [facility], intubated shortly after arrival. Dopamine initiated for hypotension. Vanc, acyclovir, ceftriaxone and ampicillin initiated empirically 10/02 CT head [facility]): no acute abn 10/02 Neuro consult: Etiology of seizure activity is unclear. Acute ischemic cerebral infarction cannot be ruled out.  Recommendations: Continue Keppra IV at 1000 mg every 12 hours. Continue Vimpat at 100 mg every 12 hours. Propofol IV for sedation as well as as seizure control. EEG to rule out continued subclinical seizure activity. MRI of the brain without contrast to rule out acute cerebral infarction. 10/02 Cards consult: Her biomarkers suggest a fairly large MI and ECG has many high risk features. 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 ASA, statin, heparin initiated. Beta blocker not option due to hypotension.  10/02 TTE:  LVEF is approximately 50 to 55% with inferior/inferoseptal hypokinesis, grade 1 diastolic dysfx 10/03 No WUA. Remained on dopamine 10/03 Persistent seizure-like activity despite mutliple anti-consvulsants. Burst suppression initiated. LP recommended to R/O infectious etiology 10/03 cont EEG: Burst suppression, asymmetric, with greater suppression on the right. Semi-periodic epileptiform discharges, left hemisphere, maximal over the mid/posterior temporal region. The findings indicate a severe diffuse encephalopathy with epileptogenic potential noted over the left hemisphere, particularly over the left posterior temporal-occipital region. No ongoing seizure activity was observed that would explain an altered  mental status 10/04 Remained in medically induced coma and on dopamine.  10/04 LP performed by Neuro. CSF: glu 123, protein 30, RBC 3, WBC 8, 52% lymphs 10/05 Unable to tolerated decrease in propofol due to tachypnea. Ketamine initiated. Propofol weaned to off. DA transitioned to NE. Anti-bacterial abx discontinued. Acyclovir continued 10/05 CT head: no acute abn 10/06 Low grade fever. Resp cx and C diff PCR ordered. Ketamine stopped. Midaz gtt @ 10 mg/hr. No overt seizures. Continuous EEG  INDWELLING DEVICES:: ETT 10/02 >>  R IJ CVL 10/02 >>   MICRO DATA: MRSA PCR 10/02 >> POS CSF 10/04 >>  C diff PCR 10/06 >>  Resp 10/06 >>   ANTIMICROBIALS:  Vanc 1002 >> 10/05 Ceftriaxone1002 >> 10/05 ampicillin1002 >> 10/05 acyclovir1002 >>    SUBJECTIVE:  RASS -4. WUA deferred. No overt seizures  VITAL SIGNS: Temp:  [99.1 F (37.3 C)-100.8 F (38.2 C)] 100.2 F (37.9 C) (10/06 0800) Pulse Rate:  [88-121] 96 (10/06 0800) Resp:  [0-37] 24 (10/06 0800) BP: (77-183)/(27-76) 136/65 mmHg (10/06 0800) SpO2:  [94 %-100 %] 100 % (10/06 0800) FiO2 (%):  [40 %] 40 % (10/06 0800) Weight:  [101.9 kg (224 lb 10.4 oz)] 101.9 kg (224 lb 10.4 oz) (10/06 0104) VENTILATOR SETTINGS: Vent Mode:  [-] PRVC FiO2 (%):  [40 %] 40 % Set Rate:  [14 bmp] 14 bmp Vt Set:  [460 mL-550 mL] 550 mL PEEP:  [5 cmH20] 5 cmH20 Plateau Pressure:  [20 cmH20-24 cmH20] 22 cmH20 INTAKE / OUTPUT:  Intake/Output Summary (Last 24 hours) at 03/22/14 1058 Last data filed at 03/22/14 1000  Gross per 24 hour  Intake 4220.39 ml  Output   1325 ml  Net 2895.39 ml    PHYSICAL EXAMINATION: General: no distress Neuro: RASS -4 HEENT: pupils reactive Rt > Lt Cardiovascular:  Regular, no murmur Lungs:  Scattered rhonchi Abdomen:  Soft, non tender, decreased  bowel sounds Musculoskeletal:  No edema Skin:  No rashes  LABS: I have reviewed all of today's lab results. Relevant abnormalities are discussed in the A/P  section  CXR: R>L AS dz  ASSESSMENT / PLAN:  PULMONARY A: Acute respiratory failure 2nd to inability to protect airway Asymmetric pulm edema vs PNA P:   Cont full vent support - settings reviewed and/or adjusted Cont vent bundle Daily SBT if/when meets criteria No diuresis until off vasopressors  CARDIOVASCULAR A:  NSTEMI Hx of HTN, HLD. Hypotension, improving P:  Cards following Wean off NE for MAP > 65 mmHg  RENAL A:   CKD, Cr appears to be @ baseline Hypokalemia, resolved Hypophosphatemia,  P:   Monitor BMET intermittently Monitor I/Os Correct electrolytes as indicated  GASTROINTESTINAL A:   No issues P:   SUP: enteral PPI Cont TFs   HEMATOLOGIC A:   Mild ICU associated anemia P:  DVT px: SQ heparin Monitor CBC intermittently Transfuse per usual ICU guidelines noting recent NSTEMI  INFECTIOUS A:   Concern for meningoencephalitis Fever Diarrhea  R/O PNA R/O C diff P:   Micro and abx as above  ENDOCRINE A:   DM type II, marginal control  P:   Cont SSI Increase Lantus 10/06  NEUROLOGIC A:   Status epilepticus, appears controlled Hx of Bipolar, essential tremor, anxiety, Migraine, Vertigo. P:   RASS goal: -4 AED's per neurology Cont EEG per Neuro   Family updated:    Interdisciplinary Family Meeting v Palliative Care Meeting:     CC time 35 minutes.  Billy Fischeravid Simonds, MD ; [facility] [phone].  After 5:30 PM or weekends, call [phone]

## 2014-03-23 DIAGNOSIS — E118 Type 2 diabetes mellitus with unspecified complications: Secondary | ICD-10-CM

## 2014-03-23 LAB — GLUCOSE, CAPILLARY
Glucose-Capillary: 196 mg/dL — ABNORMAL HIGH (ref 70–99)
Glucose-Capillary: 204 mg/dL — ABNORMAL HIGH (ref 70–99)
Glucose-Capillary: 220 mg/dL — ABNORMAL HIGH (ref 70–99)
Glucose-Capillary: 221 mg/dL — ABNORMAL HIGH (ref 70–99)
Glucose-Capillary: 222 mg/dL — ABNORMAL HIGH (ref 70–99)
Glucose-Capillary: 247 mg/dL — ABNORMAL HIGH (ref 70–99)
Glucose-Capillary: 249 mg/dL — ABNORMAL HIGH (ref 70–99)

## 2014-03-23 LAB — URINE MICROSCOPIC-ADD ON

## 2014-03-23 LAB — CBC WITH DIFFERENTIAL/PLATELET
Basophils Absolute: 0 10*3/uL (ref 0.0–0.1)
Basophils Relative: 0 % (ref 0–1)
Eosinophils Absolute: 0.3 10*3/uL (ref 0.0–0.7)
Eosinophils Relative: 2 % (ref 0–5)
HCT: 26.3 % — ABNORMAL LOW (ref 36.0–46.0)
Hemoglobin: 8.7 g/dL — ABNORMAL LOW (ref 12.0–15.0)
Lymphocytes Relative: 12 % (ref 12–46)
Lymphs Abs: 1.7 10*3/uL (ref 0.7–4.0)
MCH: 28.8 pg (ref 26.0–34.0)
MCHC: 33.1 g/dL (ref 30.0–36.0)
MCV: 87.1 fL (ref 78.0–100.0)
Monocytes Absolute: 1.3 10*3/uL — ABNORMAL HIGH (ref 0.1–1.0)
Monocytes Relative: 9 % (ref 3–12)
Neutro Abs: 10.9 10*3/uL — ABNORMAL HIGH (ref 1.7–7.7)
Neutrophils Relative %: 77 % (ref 43–77)
Platelets: 210 10*3/uL (ref 150–400)
RBC: 3.02 MIL/uL — ABNORMAL LOW (ref 3.87–5.11)
RDW: 15 % (ref 11.5–15.5)
WBC: 14.1 10*3/uL — ABNORMAL HIGH (ref 4.0–10.5)

## 2014-03-23 LAB — URINALYSIS, ROUTINE W REFLEX MICROSCOPIC
Bilirubin Urine: NEGATIVE
Glucose, UA: 250 mg/dL — AB
Ketones, ur: NEGATIVE mg/dL
Leukocytes, UA: NEGATIVE
Nitrite: NEGATIVE
Protein, ur: 30 mg/dL — AB
Specific Gravity, Urine: 1.01 (ref 1.005–1.030)
Urobilinogen, UA: 0.2 mg/dL (ref 0.0–1.0)
pH: 8 (ref 5.0–8.0)

## 2014-03-23 LAB — VARICELLA-ZOSTER BY PCR: Varicella-Zoster, PCR: NOT DETECTED

## 2014-03-23 LAB — PROCALCITONIN: Procalcitonin: 0.42 ng/mL

## 2014-03-23 LAB — ENTEROVIRUS PCR: Enterovirus PCR: NOT DETECTED

## 2014-03-23 LAB — TRIGLYCERIDES: Triglycerides: 111 mg/dL (ref <150)

## 2014-03-23 MED ORDER — PROPOFOL 10 MG/ML IV EMUL
0.0000 ug/kg/min | INTRAVENOUS | Status: DC
  Administered 2014-03-23: 20 ug/kg/min via INTRAVENOUS

## 2014-03-23 MED ORDER — PIPERACILLIN-TAZOBACTAM 3.375 G IVPB
3.3750 g | Freq: Three times a day (TID) | INTRAVENOUS | Status: AC
  Administered 2014-03-23 – 2014-03-29 (×20): 3.375 g via INTRAVENOUS
  Filled 2014-03-23 (×22): qty 50

## 2014-03-23 MED ORDER — FENTANYL CITRATE 0.05 MG/ML IJ SOLN
25.0000 ug | INTRAMUSCULAR | Status: DC | PRN
  Administered 2014-03-23 – 2014-03-24 (×3): 100 ug via INTRAVENOUS
  Administered 2014-03-24: 50 ug via INTRAVENOUS
  Administered 2014-03-24: 100 ug via INTRAVENOUS
  Administered 2014-03-25: 50 ug via INTRAVENOUS
  Administered 2014-03-25 – 2014-03-29 (×11): 100 ug via INTRAVENOUS
  Filled 2014-03-23 (×18): qty 2

## 2014-03-23 MED ORDER — MIDAZOLAM HCL 2 MG/2ML IJ SOLN: 1.0000 mg | INTRAMUSCULAR | Status: DC | PRN

## 2014-03-23 MED ORDER — FENTANYL CITRATE 0.05 MG/ML IJ SOLN
50.0000 ug | INTRAMUSCULAR | Status: DC | PRN
Start: 2014-03-23 — End: 2014-03-23
  Administered 2014-03-23: 50 ug via INTRAVENOUS
  Filled 2014-03-23: qty 2

## 2014-03-23 MED ORDER — PROPOFOL 10 MG/ML IV EMUL
INTRAVENOUS | Status: AC
  Filled 2014-03-23: qty 100

## 2014-03-23 MED ORDER — VANCOMYCIN HCL 10 G IV SOLR
1250.0000 mg | Freq: Two times a day (BID) | INTRAVENOUS | Status: DC
  Administered 2014-03-23 – 2014-03-25 (×4): 1250 mg via INTRAVENOUS
  Filled 2014-03-23 (×5): qty 1250

## 2014-03-23 MED ORDER — NOREPINEPHRINE BITARTRATE 1 MG/ML IV SOLN
0.0000 ug/min | INTRAVENOUS | Status: DC
  Administered 2014-03-23: 7 ug/min via INTRAVENOUS
  Filled 2014-03-23 (×2): qty 16

## 2014-03-23 MED ORDER — DEXMEDETOMIDINE HCL IN NACL 200 MCG/50ML IV SOLN
0.4000 ug/kg/h | INTRAVENOUS | Status: DC
  Administered 2014-03-23 – 2014-03-25 (×9): 0.4 ug/kg/h via INTRAVENOUS
  Administered 2014-03-25: 0.3 ug/kg/h via INTRAVENOUS
  Administered 2014-03-25 (×2): 0.5 ug/kg/h via INTRAVENOUS
  Administered 2014-03-25: 0.4 ug/kg/h via INTRAVENOUS
  Administered 2014-03-26: 0.2 ug/kg/h via INTRAVENOUS
  Filled 2014-03-23 (×17): qty 50

## 2014-03-23 MED ORDER — INSULIN GLARGINE 100 UNIT/ML ~~LOC~~ SOLN
30.0000 [IU] | Freq: Every day | SUBCUTANEOUS | Status: DC
  Filled 2014-03-23: qty 0.3

## 2014-03-23 NOTE — Progress Notes (Addendum)
PULMONARY / CRITICAL CARE MEDICINE   Name: Brodie Scovell MRN: [id number] DOB: 14-May-1941    ADMISSION DATE:  03/18/2014  REFERRING MD :  [facility]  CHIEF COMPLAINT:  Seizure  INITIAL PRESENTATION:  73 yo female transferred from [facility] with status epilepticus and NSTEMI.  Intubated for airway protection.  PCCM asked to admit to ICU.  SIGNIFICANT EVENTS/STUDIES:  10/02 Transfer to [facility] intubated shortly after arrival. Dopamine initiated for hypotension. Vanc, acyclovir, ceftriaxone and ampicillin initiated empirically 10/02 CT head [facility]): no acute abn 10/02 Neuro consult: Etiology of seizure activity is unclear. Acute ischemic cerebral infarction cannot be ruled out.  Recommendations: Continue Keppra IV at 1000 mg every 12 hours. Continue Vimpat at 100 mg every 12 hours. Propofol IV for sedation as well as as seizure control. EEG to rule out continued subclinical seizure activity. MRI of the brain without contrast to rule out acute cerebral infarction. 10/02 Cards consult: Her biomarkers suggest a fairly large MI and ECG has many high risk features. 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 ASA, statin, heparin initiated. Beta blocker not option due to hypotension.  10/02 TTE:  LVEF is approximately 50 to 55% with inferior/inferoseptal hypokinesis, grade 1 diastolic dysfx 10/03 No WUA. Remained on dopamine 10/03 Persistent seizure-like activity despite mutliple anti-consvulsants. Burst suppression initiated. LP recommended to R/O infectious etiology 10/03 cont EEG: Burst suppression, asymmetric, with greater suppression on the right. Semi-periodic epileptiform discharges, left hemisphere, maximal over the mid/posterior temporal region. The findings indicate a severe diffuse encephalopathy with epileptogenic potential noted over the left hemisphere, particularly over the left posterior temporal-occipital region. No ongoing seizure activity was observed that would explain an altered  mental status 10/04 Remained in medically induced coma and on dopamine.  10/04 LP performed by Neuro. CSF: glu 123, protein 30, RBC 3, WBC 8, 52% lymphs 10/05 Unable to tolerated decrease in propofol due to tachypnea. Ketamine initiated. Propofol weaned to off. DA transitioned to NE. Anti-bacterial abx discontinued. Acyclovir continued 10/05 CT head: no acute abn 10/06 Low grade fever. Resp cx and C diff PCR ordered. Ketamine stopped. Midaz gtt @ 10 mg/hr. No overt seizures. Continuous EEG  INDWELLING DEVICES:: ETT 10/02 >>  R IJ CVL 10/02 >>   MICRO DATA: MRSA PCR 10/02 >> POS CSF 10/04 >>  C diff PCR 10/06 >>  Resp 10/06 >>   ANTIMICROBIALS:  Vanc 10/02 >> 10/05 Ceftriaxone 10/02 >> 10/05 Ampicillin 10/02 >> 10/05 Acyclovir 10/02 >>  Zosyn 10/7 >>> Vancomycin 10/7 >>>  SUBJECTIVE:  RASS -4. WUA deferred. No overt seizures  VITAL SIGNS: Temp:  [99.6 F (37.6 C)-102.1 F (38.9 C)] 102 F (38.9 C) (10/07 0800) Pulse Rate:  [86-100] 92 (10/07 1000) Resp:  [0-39] 30 (10/07 1000) BP: (96-141)/(38-72) 120/40 mmHg (10/07 1000) SpO2:  [95 %-100 %] 100 % (10/07 1000) FiO2 (%):  [40 %] 40 % (10/07 0845) Weight:  [104.7 kg (230 lb 13.2 oz)] 104.7 kg (230 lb 13.2 oz) (10/07 0445) VENTILATOR SETTINGS: Vent Mode:  [-] PRVC FiO2 (%):  [40 %] 40 % Set Rate:  [14 bmp] 14 bmp Vt Set:  [550 mL] 550 mL PEEP:  [5 cmH20] 5 cmH20 Plateau Pressure:  [24 cmH20-29 cmH20] 27 cmH20 INTAKE / OUTPUT:  Intake/Output Summary (Last 24 hours) at 03/23/14 1053 Last data filed at 03/23/14 1000  Gross per 24 hour  Intake 3488.19 ml  Output   2570 ml  Net 918.19 ml    PHYSICAL EXAMINATION: General: no distress Neuro: RASS -4, flicker  to painful stimuli BLE HEENT: pupils reactive Rt > Lt Cardiovascular:  Regular, no murmur Lungs:  Scattered rhonchi Abdomen:  Soft, non tender, decreased  bowel sounds Musculoskeletal:  No edema Skin:  No rashes  LABS: I have reviewed all of today's lab  results. Relevant abnormalities are discussed in the A/P section  CXR: R>L AS dz  ASSESSMENT / PLAN:  PULMONARY A: Acute respiratory failure 2nd to inability to protect airway Asymmetric pulm edema vs PNA P:   Cont full vent support - settings reviewed and/or adjusted Cont vent bundle Daily SBT if/when meets criteria F/u CXR 10/8 Attempt to wean on Precedex 10/8 if tolerates sedation change.   CARDIOVASCULAR A:  NSTEMI Hx of HTN, HLD. Hypotension, improving P:  Cards following Wean off NE for MAP > 65 mmHg If able to wean NE then consider diuresis.   RENAL A:   CKD, Cr appears to be @ baseline Hypokalemia, resolved Hypophosphatemia,  P:   Monitor BMET intermittently Monitor I/Os Correct electrolytes as indicated Free water flushes 200ML q4 hours  GASTROINTESTINAL A:   No issues P:   SUP: enteral PPI Cont TFs   HEMATOLOGIC A:   Mild ICU associated anemia P:  DVT px: SQ heparin Monitor CBC intermittently Transfuse per usual ICU guidelines noting recent NSTEMI  INFECTIOUS A:   Concern for meningoencephalitis Fever Diarrhea  R/O PNA R/O C diff  P:   Re-culture 10/7 Restart empiric antibiotics as above ICU PCT algorithm   ENDOCRINE A:   DM type II, marginal control  P:   Cont SSI Increase Lantus 10/07 to 30 units  NEUROLOGIC A:   Status epilepticus, appears controlled Hx of Bipolar, essential tremor, anxiety, Migraine, Vertigo. P:   RASS goal: -1 Change sedation to Precedex AED's per neurology Cont EEG per Neuro   Family updated:    Interdisciplinary Family Meeting v Palliative Care Meeting:   CC time : 35 mins  PCCM ATTENDING: I have interviewed and examined the patient and reviewed the database. I have formulated the assessment and plan as reflected in the note above with amendments made by me. Seen with ACNP Shanon RosserHoffman  David Simonds, MD;  PCCM service; Mobile [phone]

## 2014-03-23 NOTE — Progress Notes (Signed)
Versed 65 cc of 1mg /ml wasted with Lennie MuckleBethany Culbertson, RN.

## 2014-03-23 NOTE — Progress Notes (Signed)
eLink Physician-Brief Progress Note Patient Name: Brooke DrossHarriett Gross DOB: [birth year] MRN: [id number]   Date of Service  03/23/2014  HPI/Events of Note  Pt has PNA, and VDRF.  She has significant increase in Ve when sedation weaned down.   eICU Interventions  Will start diprivan per PAD 3 protocol.     Intervention Category Major Interventions: Other:  SOOD,VINEET 03/23/2014, 4:57 AM

## 2014-03-23 NOTE — Progress Notes (Signed)
Patient Profile: 73 yo female w/ bipolar DO, DM, CKD, HLD, obesity, was admitted 10/02 early am w/ status epilepticus and NSTEMI.   Subjective: Remains intubated and sedated.   Objective: Vital signs in last 24 hours: Temp:  [99.6 F (37.6 C)-102.1 F (38.9 C)] 102 F (38.9 C) (10/07 0800) Pulse Rate:  [86-100] 92 (10/07 1000) Resp:  [0-39] 30 (10/07 1000) BP: (96-141)/(38-72) 120/40 mmHg (10/07 1000) SpO2:  [95 %-100 %] 100 % (10/07 1000) FiO2 (%):  [40 %] 40 % (10/07 0845) Weight:  [230 lb 13.2 oz (104.7 kg)] 230 lb 13.2 oz (104.7 kg) (10/07 0445) Last BM Date: 03/22/14  Intake/Output from previous day: 10/06 0701 - 10/07 0700 In: 3432.6 [I.V.:624.2; NG/GT:2190; IV Piggyback:618.4] Out: 2485 [Urine:2485] Intake/Output this shift: Total I/O In: 594.6 [I.V.:99.6; Other:90; NG/GT:305; IV Piggyback:100] Out: 410 [Urine:410]  Medications Current Facility-Administered Medications  Medication Dose Route Frequency Provider Last Rate Last Dose  . 0.9 %  sodium chloride infusion  250 mL Intravenous PRN Rahul P Desai, PA-C      . acetaminophen (TYLENOL) solution 650 mg  650 mg Per Tube Q6H PRN Alyson ReedyWesam G Yacoub, MD   650 mg at 03/23/14 0818  . acyclovir (ZOVIRAX) 470 mg in dextrose 5 % 100 mL IVPB  10 mg/kg (Ideal) Intravenous Q8H Herby AbrahamMichelle T Bell, RPH   470 mg at 03/23/14 0152  . antiseptic oral rinse (CPC / CETYLPYRIDINIUM CHLORIDE 0.05%) solution 7 mL  7 mL Mouth Rinse QID Coralyn HellingVineet Sood, MD   7 mL at 03/23/14 0400  . artificial tears (LACRILUBE) ophthalmic ointment   Both Eyes 3 times per day Coralyn HellingVineet Sood, MD      . aspirin tablet 325 mg  325 mg Per Tube Daily Merwyn Katosavid B Simonds, MD   325 mg at 03/23/14 0930  . atorvastatin (LIPITOR) tablet 80 mg  80 mg Per Tube q1800 Merwyn Katosavid B Simonds, MD   80 mg at 03/22/14 1853  . chlorhexidine (PERIDEX) 0.12 % solution 15 mL  15 mL Mouth Rinse BID Coralyn HellingVineet Sood, MD   15 mL at 03/23/14 0814  . feeding supplement (PRO-STAT SUGAR FREE 64) liquid 30 mL  30 mL  Per Tube BID Heather Cornelison Pitts, RD   30 mL at 03/23/14 0950  . feeding supplement (VITAL HIGH PROTEIN) liquid 1,000 mL  1,000 mL Per Tube Q24H Heather Cornelison Pitts, RD   1,000 mL at 03/22/14 1315  . fentaNYL (SUBLIMAZE) injection 50 mcg  50 mcg Intravenous Q2H PRN Coralyn HellingVineet Sood, MD   50 mcg at 03/23/14 0809  . free water 200 mL  200 mL Per Tube 6 times per day Merwyn Katosavid B Simonds, MD   200 mL at 03/23/14 0800  . insulin aspart (novoLOG) injection 0-15 Units  0-15 Units Subcutaneous 6 times per day Coralyn HellingVineet Sood, MD   5 Units at 03/23/14 0817  . insulin glargine (LANTUS) injection 20 Units  20 Units Subcutaneous Daily Merwyn Katosavid B Simonds, MD   20 Units at 03/23/14 [phone]  . lacosamide (VIMPAT) 100 mg in sodium chloride 0.9 % 25 mL IVPB  100 mg Intravenous Q12H Charles Stewart   100 mg at 03/22/14 2300  . levETIRAcetam (KEPPRA) IVPB 1500 mg/ 100 mL premix  1,500 mg Intravenous Q12H Ritta SlotMcNeill Kirkpatrick, MD   1,500 mg at 03/23/14 0945  . midazolam (VERSED) 100 mg in sodium chloride 0.9 % 100 mL (1 mg/mL) infusion  2 mg/hr Intravenous Continuous Ritta SlotMcNeill Kirkpatrick, MD 2 mL/hr at 03/23/14 0942 2 mg/hr at 03/23/14  1610  . midazolam (VERSED) injection 1 mg  1 mg Intravenous Q2H PRN Coralyn Helling, MD      . multivitamin liquid 5 mL  5 mL Per Tube Daily Heather Cornelison Pitts, RD   5 mL at 03/23/14 0930  . norepinephrine (LEVOPHED) 4 mg in dextrose 5 % 250 mL infusion  0-40 mcg/min Intravenous Titrated Charles Stewart 26.3 mL/hr at 03/23/14 0700 7 mcg/min at 03/23/14 0700  . pantoprazole sodium (PROTONIX) 40 mg/20 mL oral suspension 40 mg  40 mg Per Tube QHS Herby Abraham, RPH   40 mg at 03/22/14 2224  . phenytoin (DILANTIN) injection 100 mg  100 mg Intravenous 3 times per day Ritta Slot, MD   100 mg at 03/23/14 0506  . propofol (DIPRIVAN) 10 mg/ml infusion  0-50 mcg/kg/min Intravenous Continuous Coralyn Helling, MD 6.3 mL/hr at 03/23/14 0942 10 mcg/kg/min at 03/23/14 0942  . topiramate (TOPAMAX) tablet  200 mg  200 mg Per Tube BID Merwyn Katos, MD   200 mg at 03/23/14 0930    PE: General appearance: intubated and sedated Lungs: expiratory wheezes on the right. LLF clear Heart: regular rate and rhythm Extremities: trace edema Pulses: 2+ and symmetric Skin: warm and dry Neurologic: Grossly normal  Lab Results:   Recent Labs  03/21/14 0313  WBC 13.1*  HGB 10.8*  HCT 33.0*  PLT 224   BMET  Recent Labs  03/20/14 1742 03/21/14 0313 03/22/14 0531  NA  --  142 145  K  --  3.5* 3.5*  CL  --  110 113*  CO2  --  21 20  GLUCOSE  --  242* 231*  BUN  --  11 14  CREATININE  --  0.76 0.90  CALCIUM 7.1* 7.2* 6.8*    Assessment/Plan  Principal Problem:   Status epilepticus Active Problems:   NSTEMI, initial episode of care   Diabetes  1. Status epilepticus: stable. No further seizure activity. Neuro following.   2. NSTEMI: biomarkers suggest a fairly large MI and ECG has many high risk features including new conduction system disease and diffuse ST segment changes. 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 Systolic function by echo 03/18/14 normal with EF at 50-55%. No arrhthymias on telemetry. BP and HR both stable. She will require cardiac cath if she makes a neurologic recovery and will need to continue medical management for NSTEMI in the meantime. Continue high dose ASA and high dose Lipitor.   3. Hypokalemia: K is 3.5. Recommend supplementation.    4. DM: Continue management per primary.     LOS: 5 days    Brittainy M. Delmer Islam 03/23/2014 10:49 AM  Patient seen, examined. Available data reviewed. Agree with findings, assessment, and plan as outlined by Robbie Lis, PA-C. The patient remains intubated and sedated. Versed was stopped this morning and hopefully she will begin to wake up over the next 24 hours. From a cardiac perspective, she continues to require low dose of norepinephrine. Her heart rhythm is stable. I reviewed her medications and  recommend that we continue the same. We'll continue to follow from a distance. Anticipate cardiac catheterization if she has a reasonably good neurologic recovery.  Tonny Bollman, M.D. 03/23/2014 2:50 PM

## 2014-03-23 NOTE — Progress Notes (Signed)
2100 wasted 75 ml of ketamine in sink. Witnessed by Ripley FraiseMonica Harper, RN

## 2014-03-23 NOTE — Progress Notes (Addendum)
ANTIBIOTIC CONSULT NOTE - follow up Pharmacy Consult for Acyclovir Indication:  R/o meningoencephalitis   No Known Allergies  Patient Measurements: Height: 5' 0.63" (154 cm) (from data on 12/15/13) Weight: 230 lb 13.2 oz (104.7 kg) IBW/kg (Calculated) : 46.95   Vital Signs: Temp: 102 F (38.9 C) (10/07 0800) Temp Source: Axillary (10/07 0800) BP: 120/40 mmHg (10/07 1000) Pulse Rate: 92 (10/07 1000) Intake/Output from previous day: 10/06 0701 - 10/07 0700 In: 3432.6 [I.V.:624.2; NG/GT:2190; IV Piggyback:618.4] Out: 2485 [Urine:2485] Intake/Output from this shift: Total I/O In: 594.6 [I.V.:99.6; Other:90; NG/GT:305; IV Piggyback:100] Out: 410 [Urine:410]  Labs:  Recent Labs  03/21/14 0313 03/22/14 0531  WBC 13.1*  --   HGB 10.8*  --   PLT 224  --   CREATININE 0.76 0.90   Estimated Creatinine Clearance: 63.4 ml/min (by C-G formula based on Cr of 0.9). No results found for this basename: VANCOTROUGH, Leodis Binet, VANCORANDOM, GENTTROUGH, GENTPEAK, GENTRANDOM, TOBRATROUGH, TOBRAPEAK, TOBRARND, AMIKACINPEAK, AMIKACINTROU, AMIKACIN,  in the last 72 hours   Microbiology: Recent Results (from the past 720 hour(s))  MRSA PCR SCREENING     Status: Abnormal   Collection Time    03/18/14  3:08 AM      Result Value Ref Range Status   MRSA by PCR POSITIVE (*) NEGATIVE Final   Comment:            The GeneXpert MRSA Assay (FDA     approved for NASAL specimens     only), is one component of a     comprehensive MRSA colonization     surveillance program. It is not     intended to diagnose MRSA     infection nor to guide or     monitor treatment for     MRSA infections.     RESULT CALLED TO, READ BACK BY AND VERIFIED WITH:     Karie Fetch AT 1610 03/18/14 BY K BARR  CSF CULTURE     Status: None   Collection Time    03/20/14  5:21 PM      Result Value Ref Range Status   Specimen Description CSF   Final   Special Requests Normal   Final   Gram Stain     Final   Value:  CYTOSPIN SLIDE WBC PRESENT,BOTH PMN AND MONONUCLEAR     NO ORGANISMS SEEN     Performed at [facility]     Performed at [facility]   Culture     Final   Value: NO GROWTH 1 DAY     Performed at [facility]   Report Status PENDING   Incomplete  GRAM STAIN     Status: None   Collection Time    03/20/14  5:21 PM      Result Value Ref Range Status   Specimen Description CSF   Final   Special Requests NONE   Final   Gram Stain     Final   Value: CYTOSPIN SLIDE     WBC PRESENT,BOTH PMN AND MONONUCLEAR     NO ORGANISMS SEEN   Report Status 03/20/2014 FINAL   Final  CLOSTRIDIUM DIFFICILE BY PCR     Status: None   Collection Time    03/22/14  3:28 AM      Result Value Ref Range Status   C difficile by pcr NEGATIVE  NEGATIVE Final  CULTURE, RESPIRATORY (NON-EXPECTORATED)     Status: None   Collection Time    03/22/14 11:07  AM      Result Value Ref Range Status   Specimen Description TRACHEAL ASPIRATE   Final   Special Requests NONE   Final   Gram Stain PENDING   Incomplete   Culture     Final   Value: NO GROWTH 1 DAY     [facility]   Report Status PENDING   Incomplete    Assessment: 73 yo obese female [facility] with status epilepticus and NSTEMI.  Pt is currently on acyclovir for concern of viral meningoencephalitis. WBC was elevated yesterday at 13.1. Pt has spiked a fever again today of 102 F. Renal fx remains stable.   10/6 CSF culture >> ngtd  10/6 Trach asp >> ngtd    Goal of Therapy:  Resolution of infection   Plan -Continue Acyclovir to  470 mg IV q8h -Monitor clinical status, renal function daily.    Vinnie LevelBenjamin Mancheril, PharmD.  Clinical Pharmacist Pager [phone]  Addendum: Restarting Vancomycin and Zosyn for pneumonia given fever spike.   Plan: Re-start Vancomycin 1250 mg IV Q 12 hours Start Zosyn 3.375 gm IV Q 8 hours  [facility]   Benjamin Mancheril, PharmD.  Clinical  Pharmacist Pager [phone]

## 2014-03-23 NOTE — Progress Notes (Signed)
RT note- several attempt to wean since precedex has been started, continues to have high VE and RR, remains on full support.

## 2014-03-23 NOTE — Progress Notes (Addendum)
Subjective: Patient remains intubated.  On Versed, Propofol and fentanyl prn.  LTM remains connected.    Objective: Current vital signs: BP 138/67  Pulse 88  Temp(Src) 98.8 F (37.1 C) (Axillary)  Resp 30  Ht 5' 0.63" (1.54 m)  Wt 104.7 kg (230 lb 13.2 oz)  BMI 44.15 kg/m2  SpO2 100% Vital signs in last 24 hours: Temp:  [98.8 F (37.1 C)-102.1 F (38.9 C)] 98.8 F (37.1 C) (10/07 1100) Pulse Rate:  [85-100] 88 (10/07 1330) Resp:  [0-39] 30 (10/07 1330) BP: (96-141)/(38-76) 138/67 mmHg (10/07 1330) SpO2:  [95 %-100 %] 100 % (10/07 1330) FiO2 (%):  [40 %] 40 % (10/07 1330) Weight:  [104.7 kg (230 lb 13.2 oz)] 104.7 kg (230 lb 13.2 oz) (10/07 0445)  Intake/Output from previous day: 10/06 0701 - 10/07 0700 In: 3432.6 [I.V.:624.2; NG/GT:2190; IV Piggyback:618.4] Out: 2485 [Urine:2485] Intake/Output this shift: Total I/O In: 797.3 [I.V.:133.3; NG/GT:430; IV Piggyback:234] Out: 760 [Urine:760] Nutritional status: NPO  Neurologic Exam: Mental Status: Patient does not respond to verbal stimuli.  Does not respond to deep sternal rub.  Does not follow commands.  No verbalizations noted.  Cranial Nerves: II: patient does not respond confrontation bilaterally, pupils right 2 mm, left 2 mm,and reactive bilaterally III,IV,VI: doll's response absent bilaterally.  V,VII: corneal reflex reduced bilaterally  VIII: patient does not respond to verbal stimuli IX,X: gag reflex unable to test, XI: trapezius strength unable to test bilaterally XII: tongue strength unable to test Motor: Extremities flaccid throughout.  No spontaneous movement noted.  No purposeful movements noted. Sensory: Does not respond to noxious stimuli in any extremity. Plantars: upgoing bilaterally Cerebellar: Unable to perform    Lab Results: Basic Metabolic Panel:  Recent Labs Lab 03/18/14 0614 03/19/14 0330 03/19/14 2000 03/20/14 0535 03/20/14 1742 03/21/14 0313 03/22/14 0531 03/22/14 0850  NA  140 144 145 145  --  142 145  --   K 3.2* 3.3* 3.3* 3.3*  --  3.5* 3.5*  --   CL 107 110 112 112  --  110 113*  --   CO2 22 24 21 22   --  21 20  --   GLUCOSE 231* 158* 125* 255*  --  242* 231*  --   BUN 35* 22 16 15   --  11 14  --   CREATININE 1.50* 1.09 1.01 0.88  --  0.76 0.90  --   CALCIUM 7.6* 7.2* 6.7* 6.5* 7.1* 7.2* 6.8*  --   MG 1.3* 1.9  --  1.5  --  1.5  --  1.1*  PHOS 2.9 1.2* 2.2* 1.9*  --  1.7*  --   --     Liver Function Tests:  Recent Labs Lab 03/18/14 0614 03/21/14 0313  AST 109* 48*  ALT 29 58*  ALKPHOS 57 61  BILITOT 0.2* <0.2*  PROT 6.4 5.7*  ALBUMIN 3.0* 2.1*   No results found for this basename: LIPASE, AMYLASE,  in the last 168 hours No results found for this basename: AMMONIA,  in the last 168 hours  CBC:  Recent Labs Lab 03/18/14 0614 03/19/14 0330 03/20/14 0535 03/21/14 0313 03/23/14 1200  WBC 14.7* 19.1* 11.4* 13.1* 14.1*  NEUTROABS  --   --   --   --  10.9*  HGB 10.6* 11.0* 10.7* 10.8* 8.7*  HCT 33.2* 33.7* 33.4* 33.0* 26.3*  MCV 90.0 88.0 88.1 87.5 87.1  PLT 233 247 234 224 210    Cardiac Enzymes:  Recent Labs Lab 03/18/14  [id number] 03/18/14 1040 03/18/14 1710 03/20/14 1030  TROPONINI >20.00* >20.00* >20.00* 7.08*    Lipid Panel:  Recent Labs Lab 03/18/14 0614 03/21/14 0314 03/23/14 0630  TRIG 52 134 111    CBG:  Recent Labs Lab 03/22/14 2323 03/23/14 0347 03/23/14 0811 03/23/14 1142 03/23/14 1312  GLUCAP 207* 220* 222* 249* 221*    Microbiology: Results for orders placed during the hospital encounter of 03/18/14  MRSA PCR SCREENING     Status: Abnormal   Collection Time    03/18/14  3:08 AM      Result Value Ref Range Status   MRSA by PCR POSITIVE (*) NEGATIVE Final   Comment:            The GeneXpert MRSA Assay (FDA     approved for NASAL specimens     only), is one component of a     comprehensive MRSA colonization     surveillance program. It is not     intended to diagnose MRSA     infection nor to  guide or     monitor treatment for     MRSA infections.     RESULT CALLED TO, READ BACK BY AND VERIFIED WITH:     Karie FetchLAUREN HOUT,RN AT [phone] 03/18/14 BY K BARR  CSF CULTURE     Status: None   Collection Time    03/20/14  5:21 PM      Result Value Ref Range Status   Specimen Description CSF   Final   Special Requests Normal   Final   Gram Stain     Final   Value: CYTOSPIN SLIDE WBC PRESENT,BOTH PMN AND MONONUCLEAR     NO ORGANISMS SEEN     [facility]     [facility] at [facility]   Culture     Final   Value: NO GROWTH 2 DAYS     [facility]   Report Status PENDING   Incomplete  GRAM STAIN     Status: None   Collection Time    03/20/14  5:21 PM      Result Value Ref Range Status   Specimen Description CSF   Final   Special Requests NONE   Final   Gram Stain     Final   Value: CYTOSPIN SLIDE     WBC PRESENT,BOTH PMN AND MONONUCLEAR     NO ORGANISMS SEEN   Report Status 03/20/2014 FINAL   Final  CLOSTRIDIUM DIFFICILE BY PCR     Status: None   Collection Time    03/22/14  3:28 AM      Result Value Ref Range Status   C difficile by pcr NEGATIVE  NEGATIVE Final  CULTURE, RESPIRATORY (NON-EXPECTORATED)     Status: None   Collection Time    03/22/14 11:07 AM      Result Value Ref Range Status   Specimen Description TRACHEAL ASPIRATE   Final   Special Requests NONE   Final   Gram Stain     Final   Value: RARE WBC PRESENT, PREDOMINANTLY MONONUCLEAR     RARE SQUAMOUS EPITHELIAL CELLS PRESENT     NO ORGANISMS SEEN     Performed at [facility]   Culture     Final   Value: NO GROWTH 1 DAY     Performed at [facility]   Report Status PENDING   Incomplete    Coagulation Studies: No results found for  this basename: LABPROT, INR,  in the last 72 hours  Imaging: Dg Chest Port 1 View  03/22/2014   CLINICAL DATA:  Respiratory failure.  Follow-up.  EXAM: PORTABLE CHEST - 1 VIEW  COMPARISON:  03/21/2014 .  FINDINGS:  Endotracheal tube and NG tube in good anatomic position. Right IJ line in good anatomic position. Stable cardiomegaly. Progressive bilateral pulmonary infiltrates. These findings could be secondary to pneumonia, pulmonary edema, and/or ARDS. Tiny left pleural effusion cannot be excluded. No pneumothorax. No acute osseous abnormality.  IMPRESSION: 1. Lines and tubes in stable position. 2. Progressive bilateral pulmonary infiltrates. This could be secondary to pneumonia, pulmonary edema, and/or ARDS. 3. Tiny left pleural effusion cannot be excluded.   Electronically Signed   By: Maisie Fus  Register   On: 03/22/2014 07:53    Medications:  I have reviewed the patient's current medications. Scheduled: . acyclovir  10 mg/kg (Ideal) Intravenous Q8H  . antiseptic oral rinse  7 mL Mouth Rinse QID  . artificial tears   Both Eyes 3 times per day  . aspirin  325 mg Per Tube Daily  . atorvastatin  80 mg Per Tube q1800  . chlorhexidine  15 mL Mouth Rinse BID  . feeding supplement (PRO-STAT SUGAR FREE 64)  30 mL Per Tube BID  . feeding supplement (VITAL HIGH PROTEIN)  1,000 mL Per Tube Q24H  . free water  200 mL Per Tube 6 times per day  . insulin aspart  0-15 Units Subcutaneous 6 times per day  . [START ON 03/24/2014] insulin glargine  30 Units Subcutaneous Daily  . lacosamide (VIMPAT) IV  100 mg Intravenous Q12H  . levETIRAcetam  1,500 mg Intravenous Q12H  . multivitamin  5 mL Per Tube Daily  . pantoprazole sodium  40 mg Per Tube QHS  . phenytoin (DILANTIN) IV  100 mg Intravenous 3 times per day  . piperacillin-tazobactam (ZOSYN)  IV  3.375 g Intravenous Q8H  . topiramate  200 mg Per Tube BID  . vancomycin  1,250 mg Intravenous Q12H    Assessment/Plan: Patient remains intubated.  Has not tolerated attempts at discontinuing sedation.  LTM shows slowing and less asymmetry with intermittent left hemispheric sharp activity.  Patient not in status epilepticus at this time.  On Vimpat, Keppra, Dilantin and  Topamax.  LP shows a mild increase in wbc's that may very well be secondary to SE.  Do not see evidence of infection.  Culture, gram stain, HSV negative.  Recommendations: 1.  Attempts to be further made at discontinuing sedation and extubation.   2.  Continue above antiepileptic therapy 3.  Continue LTM    LOS: 5 days   Thana Farr, MD [facility] [phone] 03/23/2014  1:49 PM

## 2014-03-23 NOTE — Progress Notes (Signed)
Versed (1mg /ml) 110 cc wasted in sink with Lennie MuckleBethany Culbertson.

## 2014-03-24 ENCOUNTER — Inpatient Hospital Stay (HOSPITAL_COMMUNITY): Payer: Medicare Other

## 2014-03-24 DIAGNOSIS — J810 Acute pulmonary edema: Secondary | ICD-10-CM

## 2014-03-24 DIAGNOSIS — I214 Non-ST elevation (NSTEMI) myocardial infarction: Secondary | ICD-10-CM

## 2014-03-24 LAB — CBC
HCT: 25.1 % — ABNORMAL LOW (ref 36.0–46.0)
Hemoglobin: 8 g/dL — ABNORMAL LOW (ref 12.0–15.0)
MCH: 27.6 pg (ref 26.0–34.0)
MCHC: 31.9 g/dL (ref 30.0–36.0)
MCV: 86.6 fL (ref 78.0–100.0)
Platelets: 202 10*3/uL (ref 150–400)
RBC: 2.9 MIL/uL — ABNORMAL LOW (ref 3.87–5.11)
RDW: 15.1 % (ref 11.5–15.5)
WBC: 10.3 10*3/uL (ref 4.0–10.5)

## 2014-03-24 LAB — CULTURE, RESPIRATORY

## 2014-03-24 LAB — BASIC METABOLIC PANEL
Anion gap: 12 (ref 5–15)
BUN: 17 mg/dL (ref 6–23)
CO2: 16 mEq/L — ABNORMAL LOW (ref 19–32)
Calcium: 7.2 mg/dL — ABNORMAL LOW (ref 8.4–10.5)
Chloride: 112 mEq/L (ref 96–112)
Creatinine, Ser: 0.82 mg/dL (ref 0.50–1.10)
GFR calc Af Amer: 81 mL/min — ABNORMAL LOW (ref >90)
GFR calc non Af Amer: 70 mL/min — ABNORMAL LOW (ref >90)
Glucose, Bld: 240 mg/dL — ABNORMAL HIGH (ref 70–99)
Potassium: 3.4 mEq/L — ABNORMAL LOW (ref 3.7–5.3)
Sodium: 140 mEq/L (ref 137–147)

## 2014-03-24 LAB — GLUCOSE, CAPILLARY
Glucose-Capillary: 221 mg/dL — ABNORMAL HIGH (ref 70–99)
Glucose-Capillary: 212 mg/dL — ABNORMAL HIGH (ref 70–99)
Glucose-Capillary: 187 mg/dL — ABNORMAL HIGH (ref 70–99)
Glucose-Capillary: 194 mg/dL — ABNORMAL HIGH (ref 70–99)
Glucose-Capillary: 206 mg/dL — ABNORMAL HIGH (ref 70–99)

## 2014-03-24 LAB — URINE CULTURE
Colony Count: NO GROWTH
Culture: NO GROWTH

## 2014-03-24 LAB — VANCOMYCIN, RANDOM: Vancomycin Rm: 13.9 ug/mL

## 2014-03-24 LAB — PROCALCITONIN: Procalcitonin: 0.32 ng/mL

## 2014-03-24 LAB — CULTURE, RESPIRATORY W GRAM STAIN: Culture: NO GROWTH

## 2014-03-24 LAB — CSF CULTURE W GRAM STAIN
Culture: NO GROWTH
Special Requests: NORMAL

## 2014-03-24 MED ORDER — INSULIN GLARGINE 100 UNIT/ML ~~LOC~~ SOLN
40.0000 [IU] | Freq: Every day | SUBCUTANEOUS | Status: DC
  Administered 2014-03-24 – 2014-04-07 (×14): 40 [IU] via SUBCUTANEOUS
  Filled 2014-03-24 (×16): qty 0.4

## 2014-03-24 MED ORDER — PRO-STAT SUGAR FREE PO LIQD
30.0000 mL | Freq: Three times a day (TID) | ORAL | Status: DC
  Administered 2014-03-24 (×2): 30 mL
  Administered 2014-03-25: 22:00:00
  Administered 2014-03-25 – 2014-03-29 (×12): 30 mL
  Filled 2014-03-24 (×21): qty 30

## 2014-03-24 MED ORDER — POTASSIUM CHLORIDE 20 MEQ/15ML (10%) PO LIQD
40.0000 meq | Freq: Four times a day (QID) | ORAL | Status: AC
  Administered 2014-03-24 (×3): 40 meq
  Filled 2014-03-24 (×4): qty 30

## 2014-03-24 MED ORDER — FREE WATER
200.0000 mL | Freq: Three times a day (TID) | Status: DC
  Administered 2014-03-24 – 2014-03-28 (×12): 200 mL

## 2014-03-24 MED ORDER — FUROSEMIDE 10 MG/ML IJ SOLN
40.0000 mg | Freq: Two times a day (BID) | INTRAMUSCULAR | Status: AC
  Administered 2014-03-24 (×2): 40 mg via INTRAVENOUS
  Filled 2014-03-24 (×2): qty 4

## 2014-03-24 NOTE — Progress Notes (Addendum)
PULMONARY / CRITICAL CARE MEDICINE   Name: Brooke Gross MRN: [id number] DOB: 1941-04-13    ADMISSION DATE:  03/18/2014  REFERRING MD :  [facility]  CHIEF COMPLAINT:  Seizure  INITIAL PRESENTATION:  73 yo female transferred from [facility] with status epilepticus and NSTEMI.  Intubated for airway protection.  PCCM asked to admit to ICU.  SIGNIFICANT EVENTS/STUDIES:  10/02 Transfer to [facility], intubated shortly after arrival. Dopamine initiated for hypotension. Vanc, acyclovir, ceftriaxone and ampicillin initiated empirically 10/02 CT head [facility]): no acute abn 10/02 Neuro consult: Etiology of seizure activity is unclear. Acute ischemic cerebral infarction cannot be ruled out.  Recommendations: Continue Keppra IV at 1000 mg every 12 hours. Continue Vimpat at 100 mg every 12 hours. Propofol IV for sedation as well as as seizure control. EEG to rule out continued subclinical seizure activity. MRI of the brain without contrast to rule out acute cerebral infarction. 10/02 Cards consult: Her biomarkers suggest a fairly large MI and ECG has many high risk features. 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 ASA, statin, heparin initiated. Beta blocker not option due to hypotension.  10/02 TTE:  LVEF is approximately 50 to 55% with inferior/inferoseptal hypokinesis, grade 1 diastolic dysfx 10/03 No WUA. Remained on dopamine 10/03 Persistent seizure-like activity despite mutliple anti-consvulsants. Burst suppression initiated. LP recommended to R/O infectious etiology 10/03 cont EEG: Burst suppression, asymmetric, with greater suppression on the right. Semi-periodic epileptiform discharges, left hemisphere, maximal over the mid/posterior temporal region. The findings indicate a severe diffuse encephalopathy with epileptogenic potential noted over the left hemisphere, particularly over the left posterior temporal-occipital region. No ongoing seizure activity was observed that would explain an altered  mental status 10/04 Remained in medically induced coma and on dopamine.  10/04 LP performed by Neuro. CSF: glu 123, protein 30, RBC 3, WBC 8, 52% lymphs 10/05 Unable to tolerated decrease in propofol due to tachypnea. Ketamine initiated. Propofol weaned to off. DA transitioned to NE. Anti-bacterial abx discontinued. Acyclovir continued 10/05 CT head: no acute abn 10/06 Low grade fever. Resp cx and C diff PCR ordered. Ketamine stopped. Midaz gtt @ 10 mg/hr. No overt seizures. Continuous EEG 10/08 No further seizure. Remained encephalopathic. Tachypneic on WUA/SBT. Nearly off vasopressors. Remained severely volume overloaded. Lasix ordered  INDWELLING DEVICES:: ETT 10/02 >>  R IJ CVL 10/02 >>   MICRO DATA: MRSA PCR 10/02 >> POS CSF 10/04 >>  C diff PCR 10/06 >>  Resp 10/06 >>   ANTIMICROBIALS:  Vanc 10/02 >> 10/05 Ceftriaxone 10/02 >> 10/05 Ampicillin 10/02 >> 10/05 Acyclovir 10/02 >> 10/08 Zosyn 10/7 >>> Vancomycin 10/7 >>>  SUBJECTIVE:  RASS -3. Not F/C. No overt seizures  VITAL SIGNS: Temp:  [98.9 F (37.2 C)-101 F (38.3 C)] 99 F (37.2 C) (10/08 2000) Pulse Rate:  [62-95] 81 (10/08 1946) Resp:  [15-38] 31 (10/08 1946) BP: (87-156)/(35-89) 128/48 mmHg (10/08 1946) SpO2:  [98 %-100 %] 100 % (10/08 1946) FiO2 (%):  [40 %] 40 % (10/08 1946) Weight:  [105 kg (231 lb 7.7 oz)] 105 kg (231 lb 7.7 oz) (10/08 0500) VENTILATOR SETTINGS: Vent Mode:  [-] PRVC FiO2 (%):  [40 %] 40 % Set Rate:  [14 bmp] 14 bmp Vt Set:  [550 mL] 550 mL PEEP:  [5 cmH20] 5 cmH20 Plateau Pressure:  [23 cmH20-29 cmH20] 24 cmH20 INTAKE / OUTPUT:  Intake/Output Summary (Last 24 hours) at 03/24/14 2123 Last data filed at 03/24/14 1900  Gross per 24 hour  Intake 2915.31 ml  Output  3260 ml  Net -344.69 ml    PHYSICAL EXAMINATION: General: no distress Neuro: RASS -4, flicker to painful stimuli BLE HEENT: pupils reactive Rt > Lt Cardiovascular:  Regular, no murmur Lungs:  Scattered  rhonchi Abdomen:  Soft, non tender, decreased  bowel sounds Musculoskeletal:  No edema Skin:  No rashes  LABS: I have reviewed all of today's lab results. Relevant abnormalities are discussed in the A/P section  CXR: somewhat improved AS dz  ASSESSMENT / PLAN:  PULMONARY A: Acute respiratory failure 2nd to inability to protect airway Asymmetric pulm edema vs PNA P:   Cont full vent support - settings reviewed and/or adjusted Cont vent bundle Daily SBT if/when meets criteria  CARDIOVASCULAR A:  NSTEMI Hx of HTN, HLD. Hypotension, improving P:  Cards following Wean off NE for MAP > 65 mmHg If able to wean NE then consider diuresis.   RENAL A:   CKD, Cr appears to be @ baseline Hypokalemia, resolved Hypervolemia P:   Monitor BMET intermittently Monitor I/Os Correct electrolytes as indicated Lasix X 1 10/08  GASTROINTESTINAL A:   No issues P:   SUP: enteral PPI Cont TFs   HEMATOLOGIC A:   Mild ICU associated anemia P:  DVT px: SQ heparin Monitor CBC intermittently Transfuse per usual ICU guidelines noting recent NSTEMI  INFECTIOUS A: Fever 10/07 Diarrhea P:   Re-cultured 10/7 Micro and abx as above  ENDOCRINE A:   DM type II, marginal control  P:   Cont SSI Increased Lantus 10/07 to 30 units  NEUROLOGIC A:   Status epilepticus, appears controlled Hx of Bipolar, essential tremor, anxiety, Migraine, Vertigo. P:   RASS goal: -1 Cont Precedex AED's per neurology Cont EEG per Neuro   Family updated:    Interdisciplinary Family Meeting v Palliative Care Meeting:   CC time : 35 mins  PCCM ATTENDING: I have interviewed and examined the patient and reviewed the database. I have formulated the assessment and plan as reflected in the note above with amendments made by me. Seen with ACNP Shanon RosserHoffman  David Simonds, MD;  PCCM service; Mobile [phone]

## 2014-03-24 NOTE — Progress Notes (Signed)
Subjective: Patient remains unresponsive.  On Precedex.  With its discontinuation patient becomes tachypneic and does not appear to be tolerating the ventilator well.  LTM remains connected  Objective: Current vital signs: BP 105/42  Pulse 82  Temp(Src) 101 F (38.3 C) (Axillary)  Resp 33  Ht 5' 0.63" (1.54 m)  Wt 105 kg (231 lb 7.7 oz)  BMI 44.27 kg/m2  SpO2 100% Vital signs in last 24 hours: Temp:  [98.6 F (37 C)-101 F (38.3 C)] 101 F (38.3 C) (10/08 1156) Pulse Rate:  [71-95] 82 (10/08 1315) Resp:  [24-38] 33 (10/08 1315) BP: (87-160)/(37-145) 105/42 mmHg (10/08 1315) SpO2:  [98 %-100 %] 100 % (10/08 1315) FiO2 (%):  [40 %] 40 % (10/08 1200) Weight:  [105 kg (231 lb 7.7 oz)] 105 kg (231 lb 7.7 oz) (10/08 0500)  Intake/Output from previous day: 10/07 0701 - 10/08 0700 In: 3918 [I.V.:499; NG/GT:2210; IV Piggyback:1209] Out: 2925 [Urine:2925] Intake/Output this shift: Total I/O In: 396 [I.V.:71; NG/GT:140; IV Piggyback:185] Out: 1415 [Urine:1415] Nutritional status: NPO  Neurologic Exam: Mental Status:  Patient does not respond to verbal stimuli. Does not respond to deep sternal rub. Does not follow commands. No verbalizations noted.  Cranial Nerves:  II: patient does not respond confrontation bilaterally, pupils right 2 mm, left 2 mm,and reactive bilaterally  III,IV,VI: doll's response absent bilaterally.  V,VII: corneal reflex reduced bilaterally  VIII: patient does not respond to verbal stimuli  IX,X: gag reflex unable to test, XI: trapezius strength unable to test bilaterally  XII: tongue strength unable to test  Motor:  Extremities flaccid throughout. No spontaneous movement noted. No purposeful movements noted.  Sensory:  Does not respond to noxious stimuli in any extremity.  Plantars:  upgoing bilaterally  Cerebellar:  Unable to perform      Lab Results: Basic Metabolic Panel:  Recent Labs Lab 03/18/14 0614 03/19/14 0330 03/19/14 2000  03/20/14 0535  03/21/14 0313 03/22/14 0531 03/22/14 0850 03/24/14 0510  NA 140 144 145 145  --  142 145  --  140  K 3.2* 3.3* 3.3* 3.3*  --  3.5* 3.5*  --  3.4*  CL 107 110 112 112  --  110 113*  --  112  CO2 22 24 21 22   --  21 20  --  16*  GLUCOSE 231* 158* 125* 255*  --  242* 231*  --  240*  BUN 35* 22 16 15   --  11 14  --  17  CREATININE 1.50* 1.09 1.01 0.88  --  0.76 0.90  --  0.82  CALCIUM 7.6* 7.2* 6.7* 6.5*  < > 7.2* 6.8*  --  7.2*  MG 1.3* 1.9  --  1.5  --  1.5  --  1.1*  --   PHOS 2.9 1.2* 2.2* 1.9*  --  1.7*  --   --   --   < > = values in this interval not displayed.  Liver Function Tests:  Recent Labs Lab 03/18/14 0614 03/21/14 0313  AST 109* 48*  ALT 29 58*  ALKPHOS 57 61  BILITOT 0.2* <0.2*  PROT 6.4 5.7*  ALBUMIN 3.0* 2.1*   No results found for this basename: LIPASE, AMYLASE,  in the last 168 hours No results found for this basename: AMMONIA,  in the last 168 hours  CBC:  Recent Labs Lab 03/19/14 0330 03/20/14 0535 03/21/14 0313 03/23/14 1200 03/24/14 0510  WBC 19.1* 11.4* 13.1* 14.1* 10.3  NEUTROABS  --   --   --  10.9*  --   HGB 11.0* 10.7* 10.8* 8.7* 8.0*  HCT 33.7* 33.4* 33.0* 26.3* 25.1*  MCV 88.0 88.1 87.5 87.1 86.6  PLT 247 234 224 210 202    Cardiac Enzymes:  Recent Labs Lab 03/18/14 0614 03/18/14 1040 03/18/14 1710 03/20/14 1030  TROPONINI >20.00* >20.00* >20.00* 7.08*    Lipid Panel:  Recent Labs Lab 03/18/14 0614 03/21/14 0314 03/23/14 0630  TRIG 52 134 111    CBG:  Recent Labs Lab 03/23/14 1936 03/23/14 2330 03/24/14 0319 03/24/14 0800 03/24/14 1155  GLUCAP 196* 204* 221* 212* 187*    Microbiology: Results for orders placed during the hospital encounter of 03/18/14  MRSA PCR SCREENING     Status: Abnormal   Collection Time    03/18/14  3:08 AM      Result Value Ref Range Status   MRSA by PCR POSITIVE (*) NEGATIVE Final   Comment:            The GeneXpert MRSA Assay (FDA     approved for NASAL  specimens     only), is one component of a     comprehensive MRSA colonization     surveillance program. It is not     intended to diagnose MRSA     infection nor to guide or     monitor treatment for     MRSA infections.     RESULT CALLED TO, READ BACK BY AND VERIFIED WITH:     Karie Fetch AT [id number] 03/18/14 BY K BARR  CSF CULTURE     Status: None   Collection Time    03/20/14  5:21 PM      Result Value Ref Range Status   Specimen Description CSF   Final   Special Requests Normal   Final   Gram Stain     Final   Value: CYTOSPIN SLIDE WBC PRESENT,BOTH PMN AND MONONUCLEAR     NO ORGANISMS SEEN     Performed at [facility]     Performed at [facility]   Culture     Final   Value: NO GROWTH 3 DAYS     Performed at [facility]   Report Status 03/24/2014 FINAL   Final  GRAM STAIN     Status: None   Collection Time    03/20/14  5:21 PM      Result Value Ref Range Status   Specimen Description CSF   Final   Special Requests NONE   Final   Gram Stain     Final   Value: CYTOSPIN SLIDE     WBC PRESENT,BOTH PMN AND MONONUCLEAR     NO ORGANISMS SEEN   Report Status 03/20/2014 FINAL   Final  CLOSTRIDIUM DIFFICILE BY PCR     Status: None   Collection Time    03/22/14  3:28 AM      Result Value Ref Range Status   C difficile by pcr NEGATIVE  NEGATIVE Final  CULTURE, RESPIRATORY (NON-EXPECTORATED)     Status: None   Collection Time    03/22/14 11:07 AM      Result Value Ref Range Status   Specimen Description TRACHEAL ASPIRATE   Final   Special Requests NONE   Final   Gram Stain     Final   Value: RARE WBC PRESENT, PREDOMINANTLY MONONUCLEAR     RARE SQUAMOUS EPITHELIAL CELLS PRESENT     NO ORGANISMS SEEN     Performed at [facility]   Culture     Final   Value: NO GROWTH 2 DAYS     Performed at [facility]   Report Status 03/24/2014 FINAL   Final  URINE CULTURE     Status: None   Collection Time    03/23/14  1:31 PM      Result  Value Ref Range Status   Specimen Description URINE, RANDOM   Final   Special Requests NONE   Final   Culture  Setup Time     Final   Value: 03/23/2014 17:22     Performed at [facility] Count     Final   Value: NO GROWTH     Performed at [facility]   Culture     Final   Value: NO GROWTH     Performed at [facility]   Report Status 03/24/2014 FINAL   Final  CULTURE, BLOOD (ROUTINE X 2)     Status: None   Collection Time    03/23/14  2:47 PM      Result Value Ref Range Status   Specimen Description BLOOD RIGHT HAND   Final   Special Requests BOTTLES DRAWN AEROBIC AND ANAEROBIC 10CC   Final   Culture  Setup Time     Final   Value: 03/23/2014 20:15     Performed at [facility]   Culture     Final   Value:        BLOOD CULTURE RECEIVED NO GROWTH TO DATE CULTURE WILL BE HELD FOR 5 DAYS BEFORE ISSUING A FINAL NEGATIVE REPORT     Performed at [facility]   Report Status PENDING   Incomplete  CULTURE, BLOOD (ROUTINE X 2)     Status: None   Collection Time    03/23/14  2:50 PM      Result Value Ref Range Status   Specimen Description BLOOD RIGHT WRIST   Final   Special Requests BOTTLES DRAWN AEROBIC ONLY 4CC   Final   Culture  Setup Time     Final   Value: 03/23/2014 20:14     Performed at [facility]   Culture     Final   Value:        BLOOD CULTURE RECEIVED NO GROWTH TO DATE CULTURE WILL BE HELD FOR 5 DAYS BEFORE ISSUING A FINAL NEGATIVE REPORT     Performed at [facility]   Report Status PENDING   Incomplete    Coagulation Studies: No results found for this basename: LABPROT, INR,  in the last 72 hours  Imaging: Dg Chest Port 1 View  03/24/2014   CLINICAL DATA:  73 year old female with pulmonary edema.  EXAM: PORTABLE CHEST - 1 VIEW  COMPARISON:  Chest x-ray 03/22/2014.  FINDINGS: An endotracheal tube is in place with tip 3.6 cm above the carina. There is a right-sided internal jugular central venous  catheter with tip terminating in the mid superior vena cava. A nasogastric tube is seen extending into the stomach, however, the tip of the nasogastric tube extends below the lower margin of the image. Lung volumes are low some cephalization of the pulmonary vasculature with patchy multifocal interstitial and airspace opacities throughout the lungs bilaterally, most confluent the left perihilar region. Relative sparing of the left upper lobe. Mild blunting of the left costophrenic sulcus could suggest a trace left pleural effusion. Heart size appears mildly enlarged. The patient is rotated to  the right on today's exam, resulting in distortion of the mediastinal contours and reduced diagnostic sensitivity and specificity for mediastinal pathology.  IMPRESSION: 1. Support apparatus, as above. 2. Persistent multilobar interstitial and airspace disease, slightly improved compared to the prior examination. Given the cardiac enlargement, findings could certainly represent pulmonary edema, however, findings are somewhat asymmetric such that superimposed infection is difficult to exclude. Clinical correlation is recommended. 3. Probable trace left pleural effusion.   Electronically Signed   By: Trudie Reed M.D.   On: 03/24/2014 07:44    Medications:  I have reviewed the patient's current medications. Scheduled: . antiseptic oral rinse  7 mL Mouth Rinse QID  . artificial tears   Both Eyes 3 times per day  . aspirin  325 mg Per Tube Daily  . atorvastatin  80 mg Per Tube q1800  . chlorhexidine  15 mL Mouth Rinse BID  . feeding supplement (PRO-STAT SUGAR FREE 64)  30 mL Per Tube BID  . feeding supplement (VITAL HIGH PROTEIN)  1,000 mL Per Tube Q24H  . free water  200 mL Per Tube 3 times per day  . furosemide  40 mg Intravenous BID  . insulin aspart  0-15 Units Subcutaneous 6 times per day  . insulin glargine  40 Units Subcutaneous Daily  . lacosamide (VIMPAT) IV  100 mg Intravenous Q12H  . levETIRAcetam   1,500 mg Intravenous Q12H  . multivitamin  5 mL Per Tube Daily  . pantoprazole sodium  40 mg Per Tube QHS  . phenytoin (DILANTIN) IV  100 mg Intravenous 3 times per day  . piperacillin-tazobactam (ZOSYN)  IV  3.375 g Intravenous Q8H  . potassium chloride  40 mEq Per Tube Q6H  . topiramate  200 mg Per Tube BID  . vancomycin  1,250 mg Intravenous Q12H    Assessment/Plan: Patient continues to remain intubated and sedated.  Has been unable to be fully removed from sedation for amy period of time.  EEG from LTM improved with no evidence of seizures and some intermittent left sharp transients.  True background rhythm unable to be evaluated due to continuation of sedation.  Last two imagings were head CTs and showed no acute changes.  MRI has not been able to be performed.  LP was unremarkable.  Patient now hospitalized for six days.  There will soon be a necessity to decide about trach and PEG.  Possibly information that may be gleaned from an MRI may help [place] with this decision.    Recommendations: 1.  D/C LTM 2.  MRI of brain without contrast 3.  Continue Dilantin, Topamax, Keppra and Vimpat at current doses.       LOS: 6 days   Thana Farr, MD [facility] [phone] 03/24/2014  1:51 PM

## 2014-03-24 NOTE — Progress Notes (Signed)
NUTRITION FOLLOW-UP  DOCUMENTATION CODES Per approved criteria  -Obesity Unspecified   INTERVENTION:  Vital High Protein @ 35 ml/hr via OG tube.   Increase Prostat: 30 ml Prostat TID.   Continue MVI daily.   Tube feeding regimen provides 1140 kcal (25 kcal/kg IBW), 118 grams of protein, and 702 ml of H2O.   NUTRITION DIAGNOSIS: Inadequate oral intake related to inability to eat as evidenced by NPO status; ongoing.   Goal: Enteral nutrition to provide 60-70% of estimated calorie needs (22-25 kcals/kg ideal body weight) and 100% of estimated protein needs, based on ASPEN guidelines for permissive underfeeding in critically ill obese individuals, met.   Monitor:  Respiratory status, TF tolerance, labs  ASSESSMENT: Pt transferred from [facility] with status epilepticus and NSTEMI. Pt remains intubated.   Patient is currently intubated on ventilator support MV: 19.9 L/min Temp (24hrs), Avg:99.5 F (37.5 C), Min:98.6 F (37 C), Max:101 F (38.3 C) +fevers Propofol: none  Labs: Blood glucose: 187-221 Potassium, Magnesium, and Phosphorus low Pt on supplemental potassium.   Free water 200 ml every 8 hrs: 600 ml Total free water: 1302 ml  Height: Ht Readings from Last 1 Encounters:  03/18/14 5' 0.63" (1.54 m)    Weight: Wt Readings from Last 1 Encounters:  03/24/14 231 lb 7.7 oz (105 kg)  Admission weight: 200 lb (90.8 kg); Admission BMI 38.44  BMI:  Body mass index is 44.27 kg/(m^2).  Estimated Nutritional Needs: Kcal: 1972 Protein: >/= 90 grams Fluid: > 1.5 L/day  Skin: intact  Diet Order: NPO   Intake/Output Summary (Last 24 hours) at 03/24/14 1506 Last data filed at 03/24/14 1400  Gross per 24 hour  Intake 3515.68 ml  Output   3505 ml  Net  10.68 ml    Last BM: 10/8 (small, loose, c.diff has been ruled out)    Labs:   Recent Labs Lab 03/19/14 2000 03/20/14 0535  03/21/14 0313 03/22/14 0531 03/22/14 0850 03/24/14 0510  NA 145 145  --   142 145  --  140  K 3.3* 3.3*  --  3.5* 3.5*  --  3.4*  CL 112 112  --  110 113*  --  112  CO2 21 22  --  21 20  --  16*  BUN 16 15  --  11 14  --  17  CREATININE 1.01 0.88  --  0.76 0.90  --  0.82  CALCIUM 6.7* 6.5*  < > 7.2* 6.8*  --  7.2*  MG  --  1.5  --  1.5  --  1.1*  --   PHOS 2.2* 1.9*  --  1.7*  --   --   --   GLUCOSE 125* 255*  --  242* 231*  --  240*  < > = values in this interval not displayed.  CBG (last 3)   Recent Labs  03/24/14 0319 03/24/14 0800 03/24/14 1155  GLUCAP 221* 212* 187*    Scheduled Meds: . antiseptic oral rinse  7 mL Mouth Rinse QID  . artificial tears   Both Eyes 3 times per day  . aspirin  325 mg Per Tube Daily  . atorvastatin  80 mg Per Tube q1800  . chlorhexidine  15 mL Mouth Rinse BID  . feeding supplement (PRO-STAT SUGAR FREE 64)  30 mL Per Tube BID  . feeding supplement (VITAL HIGH PROTEIN)  1,000 mL Per Tube Q24H  . free water  200 mL Per Tube 3 times per day  .  furosemide  40 mg Intravenous BID  . insulin aspart  0-15 Units Subcutaneous 6 times per day  . insulin glargine  40 Units Subcutaneous Daily  . lacosamide (VIMPAT) IV  100 mg Intravenous Q12H  . levETIRAcetam  1,500 mg Intravenous Q12H  . multivitamin  5 mL Per Tube Daily  . pantoprazole sodium  40 mg Per Tube QHS  . phenytoin (DILANTIN) IV  100 mg Intravenous 3 times per day  . piperacillin-tazobactam (ZOSYN)  IV  3.375 g Intravenous Q8H  . potassium chloride  40 mEq Per Tube Q6H  . topiramate  200 mg Per Tube BID  . vancomycin  1,250 mg Intravenous Q12H    Continuous Infusions: . dexmedetomidine 0.4 mcg/kg/hr (03/24/14 1300)  . norepinephrine (LEVOPHED) Adult infusion 3 mcg/min (03/24/14 1300)    [place], [place], [place] Pager [phone] After Hours Pager

## 2014-03-24 NOTE — Progress Notes (Signed)
LTM Day 7 continued

## 2014-03-25 ENCOUNTER — Inpatient Hospital Stay (HOSPITAL_COMMUNITY): Payer: Medicare Other

## 2014-03-25 LAB — BASIC METABOLIC PANEL
Anion gap: 13 (ref 5–15)
BUN: 21 mg/dL (ref 6–23)
CO2: 16 mEq/L — ABNORMAL LOW (ref 19–32)
Calcium: 7.7 mg/dL — ABNORMAL LOW (ref 8.4–10.5)
Chloride: 111 mEq/L (ref 96–112)
Creatinine, Ser: 0.92 mg/dL (ref 0.50–1.10)
GFR calc Af Amer: 71 mL/min — ABNORMAL LOW (ref >90)
GFR calc non Af Amer: 61 mL/min — ABNORMAL LOW (ref >90)
Glucose, Bld: 212 mg/dL — ABNORMAL HIGH (ref 70–99)
Potassium: 4 mEq/L (ref 3.7–5.3)
Sodium: 140 mEq/L (ref 137–147)

## 2014-03-25 LAB — CBC
HCT: 24.6 % — ABNORMAL LOW (ref 36.0–46.0)
Hemoglobin: 8.1 g/dL — ABNORMAL LOW (ref 12.0–15.0)
MCH: 28.7 pg (ref 26.0–34.0)
MCHC: 32.9 g/dL (ref 30.0–36.0)
MCV: 87.2 fL (ref 78.0–100.0)
Platelets: 263 10*3/uL (ref 150–400)
RBC: 2.82 MIL/uL — ABNORMAL LOW (ref 3.87–5.11)
RDW: 15.2 % (ref 11.5–15.5)
WBC: 8.4 10*3/uL (ref 4.0–10.5)

## 2014-03-25 LAB — PROCALCITONIN: Procalcitonin: 0.37 ng/mL

## 2014-03-25 LAB — GLUCOSE, CAPILLARY
Glucose-Capillary: 171 mg/dL — ABNORMAL HIGH (ref 70–99)
Glucose-Capillary: 171 mg/dL — ABNORMAL HIGH (ref 70–99)
Glucose-Capillary: 177 mg/dL — ABNORMAL HIGH (ref 70–99)
Glucose-Capillary: 195 mg/dL — ABNORMAL HIGH (ref 70–99)
Glucose-Capillary: 199 mg/dL — ABNORMAL HIGH (ref 70–99)
Glucose-Capillary: 212 mg/dL — ABNORMAL HIGH (ref 70–99)

## 2014-03-25 MED ORDER — FUROSEMIDE 10 MG/ML IJ SOLN
40.0000 mg | Freq: Once | INTRAMUSCULAR | Status: AC
  Administered 2014-03-25: 40 mg via INTRAVENOUS
  Filled 2014-03-25: qty 4

## 2014-03-25 MED ORDER — SODIUM CHLORIDE 0.9 % IV SOLN: INTRAVENOUS | Status: DC

## 2014-03-25 MED ORDER — POTASSIUM CHLORIDE 20 MEQ/15ML (10%) PO LIQD
40.0000 meq | Freq: Once | ORAL | Status: AC
  Administered 2014-03-25: 40 meq
  Filled 2014-03-25: qty 30

## 2014-03-25 NOTE — Progress Notes (Addendum)
PULMONARY / CRITICAL CARE MEDICINE   Name: Brooke DrossHarriett Gross MRN: [id number] DOB: August 25, 1940    ADMISSION DATE:  03/18/2014  [facility]  CHIEF COMPLAINT:  Seizure  INITIAL PRESENTATION:  73 [facility] and [facility].  Intubated for airway protection.  PCCM asked to admit to ICU.  SIGNIFICANT EVENTS/STUDIES:  10[facility]02 [facility] after arrival. Dopamine initiated for hypotension. Vanc, acyclovir, ceftriaxone and ampicillin initiated empirically 10/02 CT head [facility]): no acute abn 10/02 Neuro consult: Etiology of seizure activity is unclear. Acute ischemic cerebral infarction cannot be ruled out.  Recommendations: Continue Keppra IV at 1000 mg every 12 hours. Continue Vimpat at 100 mg every 12 hours. Propofol IV for sedation as well as as seizure control. EEG to rule out continued subclinical seizure activity. MRI of the brain without contrast to rule out acute cerebral infarction. 10/02 Cards consult: Her biomarkers suggest a fairly large MI and ECG has many high risk features. 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 ASA, statin, heparin initiated. Beta blocker not option due to hypotension.  10/02 TTE:  LVEF is approximately 50 to 55% with inferior/inferoseptal hypokinesis, grade 1 diastolic dysfx 10/03 No WUA. Remained on dopamine 10/03 Persistent seizure-like activity despite mutliple anti-consvulsants. Burst suppression initiated. LP recommended to R/O infectious etiology 10/03 cont EEG: Burst suppression, asymmetric, with greater suppression on the right. Semi-periodic epileptiform discharges, left hemisphere, maximal over the mid/posterior temporal region. The findings indicate a severe diffuse encephalopathy with epileptogenic potential noted over the left hemisphere, particularly over the left posterior temporal-occipital region. No ongoing seizure activity was observed that would explain an altered  mental status 10/04 Remained in medically induced coma and on dopamine.  10/04 LP performed by Neuro. CSF: glu 123, protein 30, RBC 3, WBC 8, 52% lymphs 10/05 Unable to tolerated decrease in propofol due to tachypnea. Ketamine initiated. Propofol weaned to off. DA transitioned to NE. Anti-bacterial abx discontinued. Acyclovir continued 10/05 CT head: no acute abn 10/06 Low grade fever. Resp cx and C diff PCR ordered. Ketamine stopped. Midaz gtt @ 10 mg/hr. No overt seizures. Continuous EEG 10/08 No further seizure. Remained encephalopathic. Tachypneic on WUA/SBT. Nearly off vasopressors. Remained severely volume overloaded. Lasix ordered 10/09 Continuous EEG discontinued. Tachypneic and excessive coughing when dexmedetomidine gtt discontinued. Son updated in detail @ bedside. He indicated that he would not want to pursue trach tube and long term care if neuro prognosis for functional recovery is not good 10/09 MRI brain >>   INDWELLING DEVICES:: ETT 10/02 >>  R IJ CVL 10/02 >>   MICRO DATA: PCT 10/07: 0.42,  10/08: 0.32.  10/09: 0.37 MRSA PCR 10/02 >> POS CSF 10/04 >> NEG HSV PCR (CSF) 10/04 >> NEG C diff PCR 10/06 >> NEG Resp 10/06 >> NEG Urine 10/07 >> NEG Blood 10/07 >>   ANTIMICROBIALS:  Vanc 10/02 >> 10/05 Ceftriaxone 10/02 >> 10/05 Ampicillin 10/02 >> 10/05 Acyclovir 10/02 >> 10/08 Vancomycin 10/7 >> 10/09 Zosyn 10/7 >>    SUBJECTIVE:  RASS -3. Not F/C. No overt seizures  VITAL SIGNS: Temp:  [99 F (37.2 C)-101 F (38.3 C)] 100 F (37.8 C) (10/09 0800) Pulse Rate:  [62-87] 76 (10/09 1030) Resp:  [15-38] 21 (10/09 1030) BP: (79-161)/(35-91) 113/53 mmHg (10/09 1030) SpO2:  [98 %-100 %] 100 % (10/09 1030) FiO2 (%):  [40 %] 40 % (10/09 0800) Weight:  [102.1 kg (225 lb 1.4 oz)] 102.1 kg (225 lb 1.4 oz) (10/09 0400)  VENTILATOR SETTINGS: Vent Mode:  [-] PRVC FiO2 (%):  [40 %] 40 % Set Rate:  [14 bmp] 14 bmp Vt Set:  [550 mL] 550 mL PEEP:  [5 cmH20] 5 cmH20 Plateau  Pressure:  [22 cmH20-26 cmH20] 26 cmH20 INTAKE / OUTPUT:  Intake/Output Summary (Last 24 hours) at 03/25/14 1125 Last data filed at 03/25/14 1012  Gross per 24 hour  Intake 2845.88 ml  Output   5535 ml  Net -2689.12 ml    PHYSICAL EXAMINATION: General: dyssynchronous while of dex gtt Neuro: RASS -2, MAEs HEENT: NCAT, PERRL Cardiovascular:  Regular, no murmur Lungs:  Scattered rhonchi Abdomen:  Soft, non tender, +BS Ext: Warm, no edema  LABS: I have reviewed all of today's lab results. Relevant abnormalities are discussed in the A/P section  CXR: NSC hazy LLL opacity  ASSESSMENT / PLAN:  PULMONARY A: Acute respiratory failure Pulm edema, improved LLL AS dz - suspect PNA P:   Cont vent support - settings reviewed and/or adjusted Wean in PSV mode as tolerated Cont vent bundle Daily SBT if/when meets criteria  CARDIOVASCULAR A:  NSTEMI  Hx of HTN, HLD. Hypotension, improving P:  Cards following from distance - call back for further eval if neuro recovery is good Wean NE to off for MAP > 60 mmHg  RENAL A:   CKD, Cr appears to be @ baseline Hypokalemia, resolved Hypervolemia P:   Monitor BMET intermittently Monitor I/Os Correct electrolytes as indicated Repeat Lasix X 1 10/09  GASTROINTESTINAL A:   No issues P:   SUP: enteral PPI Cont TFs   HEMATOLOGIC A:   Mild ICU associated anemia P:  DVT px: SQ heparin Monitor CBC intermittently Transfuse per usual ICU guidelines noting recent NSTEMI  INFECTIOUS A: Fever - trending down Leukocytosis - resolved Suspected LLL PNA P:   Micro and abx as above DC vanc 10/09 Complete 7 days pip-tazo  ENDOCRINE A:   DM type II, marginal control  P:   Cont SSI Cont Lantus  NEUROLOGIC A:   Status epilepticus,controlled Hx of Bipolar, essential tremor, anxiety, Migraine, Vertigo. P:   RASS goal: -1 Cont Precedex and PRN fent AED's per neurology Daily WUA  Family updated: Son  1/09   Interdisciplinary Family Meeting v Palliative Care Meeting:   CC time : 40 mins   Billy Fischeravid Simonds, MD;  PCCM service; Mobile [phone]

## 2014-03-25 NOTE — Progress Notes (Signed)
Subjective: Patient unchanged.  Remains sedated and intubated.  EEG suppressed.    Objective: Current vital signs: BP 128/54  Pulse 74  Temp(Src) 100 F (37.8 C) (Axillary)  Resp 24  Ht 5' 0.63" (1.54 m)  Wt 102.1 kg (225 lb 1.4 oz)  BMI 43.05 kg/m2  SpO2 100% Vital signs in last 24 hours: Temp:  [99 F (37.2 C)-101 F (38.3 C)] 100 F (37.8 C) (10/09 0355) Pulse Rate:  [62-95] 74 (10/09 0745) Resp:  [15-38] 24 (10/09 0745) BP: (79-149)/(35-91) 128/54 mmHg (10/09 0700) SpO2:  [98 %-100 %] 100 % (10/09 0745) FiO2 (%):  [40 %] 40 % (10/09 0745) Weight:  [102.1 kg (225 lb 1.4 oz)] 102.1 kg (225 lb 1.4 oz) (10/09 0400)  Intake/Output from previous day: 10/08 0701 - 10/09 0700 In: 2936.3 [I.V.:258.8; NG/GT:1805; IV Piggyback:872.5] Out: 6295 [Urine:6295] Intake/Output this shift:   Nutritional status: NPO  Neurologic Exam: Mental Status:  Patient does not respond to verbal stimuli. Does not respond to deep sternal rub. Does not follow commands. No verbalizations noted.  Cranial Nerves:  II: patient does not respond confrontation bilaterally, pupils right 3 mm, left 3 mm,and reactive bilaterally  III,IV,VI: doll's response absent bilaterally.  V,VII: corneal reflex intact bilaterally  VIII: patient does not respond to verbal stimuli  IX,X: gag reflex unable to test, XI: trapezius strength unable to test bilaterally  XII: tongue strength unable to test  Motor:  Extremities flaccid throughout. No spontaneous movement noted. No purposeful movements noted.  Sensory:  Does not respond to noxious stimuli in any extremity.  Plantars:  upgoing bilaterally  Cerebellar:  Unable to perform    Lab Results: Basic Metabolic Panel:  Recent Labs Lab 03/19/14 0330 03/19/14 2000 03/20/14 0535  03/21/14 0313 03/22/14 0531 03/22/14 0850 03/24/14 0510 03/25/14 0540  NA 144 145 145  --  142 145  --  140 140  K 3.3* 3.3* 3.3*  --  3.5* 3.5*  --  3.4* 4.0  CL 110 112 112  --   110 113*  --  112 111  CO2 24 21 22   --  21 20  --  16* 16*  GLUCOSE 158* 125* 255*  --  242* 231*  --  240* 212*  BUN 22 16 15   --  11 14  --  17 21  CREATININE 1.09 1.01 0.88  --  0.76 0.90  --  0.82 0.92  CALCIUM 7.2* 6.7* 6.5*  < > 7.2* 6.8*  --  7.2* 7.7*  MG 1.9  --  1.5  --  1.5  --  1.1*  --   --   PHOS 1.2* 2.2* 1.9*  --  1.7*  --   --   --   --   < > = values in this interval not displayed.  Liver Function Tests:  Recent Labs Lab 03/21/14 0313  AST 48*  ALT 58*  ALKPHOS 61  BILITOT <0.2*  PROT 5.7*  ALBUMIN 2.1*   No results found for this basename: LIPASE, AMYLASE,  in the last 168 hours No results found for this basename: AMMONIA,  in the last 168 hours  CBC:  Recent Labs Lab 03/20/14 0535 03/21/14 0313 03/23/14 1200 03/24/14 0510 03/25/14 0540  WBC 11.4* 13.1* 14.1* 10.3 8.4  NEUTROABS  --   --  10.9*  --   --   HGB 10.7* 10.8* 8.7* 8.0* 8.1*  HCT 33.4* 33.0* 26.3* 25.1* 24.6*  MCV 88.1 87.5 87.1 86.6 87.2  PLT 234 224 210 202 263    Cardiac Enzymes:  Recent Labs Lab 03/18/14 1040 03/18/14 1710 03/20/14 1030  TROPONINI >20.00* >20.00* 7.08*    Lipid Panel:  Recent Labs Lab 03/21/14 0314 03/23/14 0630  TRIG 134 111    CBG:  Recent Labs Lab 03/24/14 1155 03/24/14 1614 03/24/14 2008 03/25/14 0018 03/25/14 0354  GLUCAP 187* 194* 206* 212* 177*    Microbiology: Results for orders placed during the hospital encounter of 03/18/14  MRSA PCR SCREENING     Status: Abnormal   Collection Time    03/18/14  3:08 AM      Result Value Ref Range Status   MRSA by PCR POSITIVE (*) NEGATIVE Final   Comment:            The GeneXpert MRSA Assay (FDA     approved for NASAL specimens     only), is one component of a     comprehensive MRSA colonization     surveillance program. It is not     intended to diagnose MRSA     infection nor to guide or     monitor treatment for     MRSA infections.     RESULT CALLED TO, READ BACK BY AND VERIFIED  WITH:     Karie Fetch AT 1610 03/18/14 BY K BARR  CSF CULTURE     Status: None   Collection Time    03/20/14  5:21 PM      Result Value Ref Range Status   Specimen Description CSF   Final   Special Requests Normal   Final   Gram Stain     Final   Value: CYTOSPIN SLIDE WBC PRESENT,BOTH PMN AND MONONUCLEAR     NO ORGANISMS SEEN     Performed at [facility]     Performed at [facility]   Culture     Final   Value: NO GROWTH 3 DAYS     Performed at [facility]   Report Status 03/24/2014 FINAL   Final  GRAM STAIN     Status: None   Collection Time    03/20/14  5:21 PM      Result Value Ref Range Status   Specimen Description CSF   Final   Special Requests NONE   Final   Gram Stain     Final   Value: CYTOSPIN SLIDE     WBC PRESENT,BOTH PMN AND MONONUCLEAR     NO ORGANISMS SEEN   Report Status 03/20/2014 FINAL   Final  CLOSTRIDIUM DIFFICILE BY PCR     Status: None   Collection Time    03/22/14  3:28 AM      Result Value Ref Range Status   C difficile by pcr NEGATIVE  NEGATIVE Final  CULTURE, RESPIRATORY (NON-EXPECTORATED)     Status: None   Collection Time    03/22/14 11:07 AM      Result Value Ref Range Status   Specimen Description TRACHEAL ASPIRATE   Final   Special Requests NONE   Final   Gram Stain     Final   Value: RARE WBC PRESENT, PREDOMINANTLY MONONUCLEAR     RARE SQUAMOUS EPITHELIAL CELLS PRESENT     NO ORGANISMS SEEN     Performed at [facility]   Culture     Final   Value: NO GROWTH 2 DAYS     Performed at [facility]   Report Status 03/24/2014 FINAL  Final  URINE CULTURE     Status: None   Collection Time    03/23/14  1:31 PM      Result Value Ref Range Status   Specimen Description URINE, RANDOM   Final   Special Requests NONE   Final   Culture  Setup Time     Final   Value: 03/23/2014 17:22     Performed at [facility]   Colony Count     Final   Value: NO GROWTH     [facility]formed [facility]   Culture     Final   Value: NO GROWTH     [facility]formed at [facility]   Report Status 03/24/2014 FINAL   Final  CULTURE, BLOOD (ROUTINE X 2)     Status: None   Collection Time    03/23/14  2:47 PM      Result Value Ref Range Status   Specimen Description BLOOD RIGHT HAND   Final   Special Requests BOTTLES DRAWN AEROBIC AND ANAEROBIC 10CC   Final   Culture  Setup Time     Final   Value: 03/23/2014 20:15     Performed at [facility]   Culture     Final   Value:        BLOOD CULTURE RECEIVED NO GROWTH TO DATE CULTURE WILL BE HELD FOR 5 DAYS BEFORE ISSUING A FINAL NEGATIVE REPORT     Performed at [facility]   Report Status PENDING   Incomplete  CULTURE, BLOOD (ROUTINE X 2)     Status: None   Collection Time    03/23/14  2:50 PM      Result Value Ref Range Status   Specimen Description BLOOD RIGHT WRIST   Final   Special Requests BOTTLES DRAWN AEROBIC ONLY 4CC   Final   Culture  Setup Time     Final   Value: 03/23/2014 20:14     Performed at [facility]   Culture     Final   Value:        BLOOD CULTURE RECEIVED NO GROWTH TO DATE CULTURE WILL BE HELD FOR 5 DAYS BEFORE ISSUING A FINAL NEGATIVE REPORT     Performed at [facility]   Report Status PENDING   Incomplete    Coagulation Studies: No results found for this basename: LABPROT, INR,  in the last 72 hours  Imaging: Dg Chest Port 1 View  03/25/2014   CLINICAL DATA:  Respiratory failure.  EXAM: PORTABLE CHEST - 1 VIEW  COMPARISON:  03/24/2014 and 03/22/2014  FINDINGS: Heart size and pulmonary vascularity are normal. There is improved aeration in the left midzone with slight new density at the right base. Vague density at the right upper lobe laterally is unchanged.  Given the waxing and waning appearance of these areas of hazy infiltrate bilaterally, I suspect these represent areas of atelectasis or pneumonitis.  No discrete effusion.  IMPRESSION: Improved aeration on the left. New  slight haziness at the right base suggesting atelectasis or pneumonitis.   Electronically Signed   By: Geanie CooleyJim  Maxwell M.D.   On: 03/25/2014 08:04   Dg Chest Port 1 View  03/24/2014   CLINICAL DATA:  73 year old female with pulmonary edema.  EXAM: PORTABLE CHEST - 1 VIEW  COMPARISON:  Chest x-ray 03/22/2014.  FINDINGS: An endotracheal tube is in place with tip 3.6 cm above the carina. There is a right-sided internal jugular central venous catheter  with tip terminating in the mid superior vena cava. A nasogastric tube is seen extending into the stomach, however, the tip of the nasogastric tube extends below the lower margin of the image. Lung volumes are low some cephalization of the pulmonary vasculature with patchy multifocal interstitial and airspace opacities throughout the lungs bilaterally, most confluent the left perihilar region. Relative sparing of the left upper lobe. Mild blunting of the left costophrenic sulcus could suggest a trace left pleural effusion. Heart size appears mildly enlarged. The patient is rotated to the right on today's exam, resulting in distortion of the mediastinal contours and reduced diagnostic sensitivity and specificity for mediastinal pathology.  IMPRESSION: 1. Support apparatus, as above. 2. Persistent multilobar interstitial and airspace disease, slightly improved compared to the prior examination. Given the cardiac enlargement, findings could certainly represent pulmonary edema, however, findings are somewhat asymmetric such that superimposed infection is difficult to exclude. Clinical correlation is recommended. 3. Probable trace left pleural effusion.   Electronically Signed   By: Trudie Reedaniel  Entrikin M.D.   On: 03/24/2014 07:44    Medications:  I have reviewed the patient's current medications. Scheduled: . antiseptic oral rinse  7 mL Mouth Rinse QID  . artificial tears   Both Eyes 3 times per day  . aspirin  325 mg Per Tube Daily  . atorvastatin  80 mg Per Tube q1800   . chlorhexidine  15 mL Mouth Rinse BID  . feeding supplement (PRO-STAT SUGAR FREE 64)  30 mL Per Tube TID  . feeding supplement (VITAL HIGH PROTEIN)  1,000 mL Per Tube Q24H  . free water  200 mL Per Tube 3 times per day  . insulin aspart  0-15 Units Subcutaneous 6 times per day  . insulin glargine  40 Units Subcutaneous Daily  . lacosamide (VIMPAT) IV  100 mg Intravenous Q12H  . levETIRAcetam  1,500 mg Intravenous Q12H  . multivitamin  5 mL Per Tube Daily  . pantoprazole sodium  40 mg Per Tube QHS  . phenytoin (DILANTIN) IV  100 mg Intravenous 3 times per day  . piperacillin-tazobactam (ZOSYN)  IV  3.375 g Intravenous Q8H  . topiramate  200 mg Per Tube BID  . vancomycin  1,250 mg Intravenous Q12H    Assessment/Plan: Patient remains intubated and sedated.  No change in clinical status neurologically.  EEG suppressed.    Recommendations: 1.  LTM to be discontinued today.  Techs notified. 2.  MRI of the brain without contrast 3.  Continue current anticonvulsant therapy   LOS: 7 days   Thana FarrLeslie Reynolds, MD [facility] [phone] 03/25/2014  8:26 AM

## 2014-03-25 NOTE — Progress Notes (Signed)
D/C LTM EEG per Dr Thad Rangereynolds

## 2014-03-25 NOTE — Progress Notes (Signed)
    Subjective:  Intubated, minimally responsive.  Objective:  Vital Signs in the last 24 hours: Temp:  [99 F (37.2 C)-101 F (38.3 C)] 100 F (37.8 C) (10/09 0800) Pulse Rate:  [62-87] 76 (10/09 1030) Resp:  [15-38] 21 (10/09 1030) BP: (79-161)/(35-91) 113/53 mmHg (10/09 1030) SpO2:  [98 %-100 %] 100 % (10/09 1030) FiO2 (%):  [40 %] 40 % (10/09 0800) Weight:  [225 lb 1.4 oz (102.1 kg)] 225 lb 1.4 oz (102.1 kg) (10/09 0400)  Intake/Output from previous day: 10/08 0701 - 10/09 0700 In: 3157.4 [I.V.:479.9; NG/GT:1805; IV Piggyback:872.5] Out: 6295 [Urine:6295]  Physical Exam: Pt is intubated, sedated  Lab Results:  Recent Labs  03/24/14 0510 03/25/14 0540  WBC 10.3 8.4  HGB 8.0* 8.1*  PLT 202 263    Recent Labs  03/24/14 0510 03/25/14 0540  NA 140 140  K 3.4* 4.0  CL 112 111  CO2 16* 16*  GLUCOSE 240* 212*  BUN 17 21  CREATININE 0.82 0.92   No results found for this basename: TROPONINI, CK, MB,  in the last 72 hours  Tele: Sinus rhythm  Assessment/Plan:  1. Status epilepticus 2. NSTEMI 3. Shock with continued required for low-dose levophed 4. Acute on Chronic systolic heart failure (volume overload)  Management as per CCM and neuro teams. Pt is being diuresed. Await neuro recovery before pursuing further cardiac eval. On appropriate post-MI Rx with ASA and a statin drug. No beta-blocker/ACE secondary to hypotension. Will follow-up on Monday to see how she is progressing neurologically.   Tonny BollmanMichael Cooper, M.D. 03/25/2014, 11:38 AM

## 2014-03-25 NOTE — Progress Notes (Signed)
UR completed.  Await medical stability to determine pt's next best level of care.   Carlyle LipaMichelle Bryson, RN BSN MHA CCM Trauma/Neuro ICU Case Manager [phone]

## 2014-03-26 ENCOUNTER — Inpatient Hospital Stay (HOSPITAL_COMMUNITY): Payer: Medicare Other

## 2014-03-26 DIAGNOSIS — G40301 Generalized idiopathic epilepsy and epileptic syndromes, not intractable, with status epilepticus: Secondary | ICD-10-CM

## 2014-03-26 LAB — CBC
HCT: 24.9 % — ABNORMAL LOW (ref 36.0–46.0)
Hemoglobin: 8.1 g/dL — ABNORMAL LOW (ref 12.0–15.0)
MCH: 27.6 pg (ref 26.0–34.0)
MCHC: 32.5 g/dL (ref 30.0–36.0)
MCV: 85 fL (ref 78.0–100.0)
Platelets: 292 10*3/uL (ref 150–400)
RBC: 2.93 MIL/uL — ABNORMAL LOW (ref 3.87–5.11)
RDW: 15.2 % (ref 11.5–15.5)
WBC: 10.1 10*3/uL (ref 4.0–10.5)

## 2014-03-26 LAB — BASIC METABOLIC PANEL
Anion gap: 16 — ABNORMAL HIGH (ref 5–15)
BUN: 22 mg/dL (ref 6–23)
CO2: 15 mEq/L — ABNORMAL LOW (ref 19–32)
Calcium: 7.8 mg/dL — ABNORMAL LOW (ref 8.4–10.5)
Chloride: 113 mEq/L — ABNORMAL HIGH (ref 96–112)
Creatinine, Ser: 0.8 mg/dL (ref 0.50–1.10)
GFR calc Af Amer: 84 mL/min — ABNORMAL LOW (ref >90)
GFR calc non Af Amer: 72 mL/min — ABNORMAL LOW (ref >90)
Glucose, Bld: 168 mg/dL — ABNORMAL HIGH (ref 70–99)
Potassium: 3.4 mEq/L — ABNORMAL LOW (ref 3.7–5.3)
Sodium: 144 mEq/L (ref 137–147)

## 2014-03-26 LAB — GLUCOSE, CAPILLARY
Glucose-Capillary: 134 mg/dL — ABNORMAL HIGH (ref 70–99)
Glucose-Capillary: 147 mg/dL — ABNORMAL HIGH (ref 70–99)
Glucose-Capillary: 158 mg/dL — ABNORMAL HIGH (ref 70–99)
Glucose-Capillary: 167 mg/dL — ABNORMAL HIGH (ref 70–99)
Glucose-Capillary: 167 mg/dL — ABNORMAL HIGH (ref 70–99)
Glucose-Capillary: 186 mg/dL — ABNORMAL HIGH (ref 70–99)

## 2014-03-26 MED ORDER — POTASSIUM CHLORIDE 20 MEQ/15ML (10%) PO LIQD
20.0000 meq | ORAL | Status: DC
  Administered 2014-03-26: 20 meq
  Filled 2014-03-26: qty 15

## 2014-03-26 MED ORDER — POTASSIUM CHLORIDE 20 MEQ/15ML (10%) PO LIQD
40.0000 meq | Freq: Three times a day (TID) | ORAL | Status: AC
  Administered 2014-03-26 (×2): 40 meq
  Filled 2014-03-26 (×2): qty 30

## 2014-03-26 MED ORDER — FUROSEMIDE 10 MG/ML IJ SOLN
40.0000 mg | Freq: Four times a day (QID) | INTRAMUSCULAR | Status: DC
  Administered 2014-03-26 (×2): 40 mg via INTRAVENOUS
  Filled 2014-03-26 (×2): qty 4

## 2014-03-26 MED ORDER — SODIUM CHLORIDE 0.9 % IJ SOLN
10.0000 mL | INTRAMUSCULAR | Status: DC | PRN
  Administered 2014-03-26: 20 mL

## 2014-03-26 NOTE — Progress Notes (Signed)
eLink Physician-Brief Progress Note Patient Name: Sharyn DrossHarriett Rego DOB: 1941-04-13 MRN: [id number]   Date of Service  03/26/2014  HPI/Events of Note  Low bp p lasix   eICU Interventions  D/c further lasix for now      Intervention Category Major Interventions: Shock - evaluation and management  Sandrea HughsMichael Wert 03/26/2014, 6:42 PM

## 2014-03-26 NOTE — Progress Notes (Addendum)
Subjective: Patient more responsive but remains intubated and on Precedex.    Objective: Current vital signs: BP 103/36  Pulse 73  Temp(Src) 98.7 F (37.1 C) (Oral)  Resp 34  Ht 5' 0.63" (1.54 m)  Wt 98.6 kg (217 lb 6 oz)  BMI 41.58 kg/m2  SpO2 98% Vital signs in last 24 hours: Temp:  [98.6 F (37 C)-99.4 F (37.4 C)] 98.7 F (37.1 C) (10/10 0800) Pulse Rate:  [68-87] 73 (10/10 0900) Resp:  [20-37] 34 (10/10 0900) BP: (97-168)/(36-98) 103/36 mmHg (10/10 0900) SpO2:  [97 %-100 %] 98 % (10/10 0900) FiO2 (%):  [40 %] 40 % (10/10 0900) Weight:  [98.6 kg (217 lb 6 oz)] 98.6 kg (217 lb 6 oz) (10/10 0422)  Intake/Output from previous day: 10/09 0701 - 10/10 0700 In: 1944.7 [I.V.:294.7; NG/GT:1265; IV Piggyback:385] Out: 3865 [Urine:3865] Intake/Output this shift: Total I/O In: 92.4 [I.V.:22.4; NG/GT:70] Out: 150 [Urine:150] Nutritional status: NPO  Neurologic Exam: Mental Status:  With calling her name and light stimulation the patient opens her eyes.  She does not follow commands. No verbalizations noted.  Cranial Nerves:  II: Blinks to confrontation bilaterally, pupils right 3 mm, left 3 mm,and reactive bilaterally  III,IV,VI: doll's response present bilaterally.  V,VII: corneal reflex intact bilaterally  VIII: patient does not respond to verbal stimuli  IX,X: gag reflex unable to test, XI: trapezius strength unable to test bilaterally  XII: tongue strength unable to test  Motor:  Extremities flaccid throughout. No spontaneous movement noted. No purposeful movements noted.  Sensory:  Does not respond to noxious stimuli in any extremity.  Plantars:  upgoing bilaterally  Cerebellar:  Unable to perform      Lab Results: Basic Metabolic Panel:  Recent Labs Lab 03/19/14 2000 03/20/14 0535  03/21/14 0313 03/22/14 0531 03/22/14 0850 03/24/14 0510 03/25/14 0540 03/26/14 0213  NA 145 145  --  142 145  --  140 140 144  K 3.3* 3.3*  --  3.5* 3.5*  --  3.4* 4.0  3.4*  CL 112 112  --  110 113*  --  112 111 113*  CO2 21 22  --  21 20  --  16* 16* 15*  GLUCOSE 125* 255*  --  242* 231*  --  240* 212* 168*  BUN 16 15  --  11 14  --  17 21 22   CREATININE 1.01 0.88  --  0.76 0.90  --  0.82 0.92 0.80  CALCIUM 6.7* 6.5*  < > 7.2* 6.8*  --  7.2* 7.7* 7.8*  MG  --  1.5  --  1.5  --  1.1*  --   --   --   PHOS 2.2* 1.9*  --  1.7*  --   --   --   --   --   < > = values in this interval not displayed.  Liver Function Tests:  Recent Labs Lab 03/21/14 0313  AST 48*  ALT 58*  ALKPHOS 61  BILITOT <0.2*  PROT 5.7*  ALBUMIN 2.1*   No results found for this basename: LIPASE, AMYLASE,  in the last 168 hours No results found for this basename: AMMONIA,  in the last 168 hours  CBC:  Recent Labs Lab 03/21/14 0313 03/23/14 1200 03/24/14 0510 03/25/14 0540 03/26/14 0213  WBC 13.1* 14.1* 10.3 8.4 10.1  NEUTROABS  --  10.9*  --   --   --   HGB 10.8* 8.7* 8.0* 8.1* 8.1*  HCT 33.0* 26.3* 25.1*  24.6* 24.9*  MCV 87.5 87.1 86.6 87.2 85.0  PLT 224 210 202 263 292    Cardiac Enzymes:  Recent Labs Lab 03/20/14 1030  TROPONINI 7.08*    Lipid Panel:  Recent Labs Lab 03/21/14 0314 03/23/14 0630  TRIG 134 111    CBG:  Recent Labs Lab 03/25/14 1626 03/25/14 2023 03/25/14 2351 03/26/14 0355 03/26/14 0802  GLUCAP 171* 195* 158* 167* 147*    Microbiology: Results for orders placed during the hospital encounter of 03/18/14  MRSA PCR SCREENING     Status: Abnormal   Collection Time    03/18/14  3:08 AM      Result Value Ref Range Status   MRSA by PCR POSITIVE (*) NEGATIVE Final   Comment:            The GeneXpert MRSA Assay (FDA     approved for NASAL specimens     only), is one component of a     comprehensive MRSA colonization     surveillance program. It is not     intended to diagnose MRSA     infection nor to guide or     monitor treatment for     MRSA infections.     RESULT CALLED TO, READ BACK BY AND VERIFIED WITH:     Karie Fetch AT 1610 03/18/14 BY K BARR  CSF CULTURE     Status: None   Collection Time    03/20/14  5:21 PM      Result Value Ref Range Status   Specimen Description CSF   Final   Special Requests Normal   Final   Gram Stain     Final   Value: CYTOSPIN SLIDE WBC PRESENT,BOTH PMN AND MONONUCLEAR     NO ORGANISMS SEEN     Performed at [facility]     Performed at [facility]   Culture     Final   Value: NO GROWTH 3 DAYS     Performed at [facility]   Report Status 03/24/2014 FINAL   Final  GRAM STAIN     Status: None   Collection Time    03/20/14  5:21 PM      Result Value Ref Range Status   Specimen Description CSF   Final   Special Requests NONE   Final   Gram Stain     Final   Value: CYTOSPIN SLIDE     WBC PRESENT,BOTH PMN AND MONONUCLEAR     NO ORGANISMS SEEN   Report Status 03/20/2014 FINAL   Final  CLOSTRIDIUM DIFFICILE BY PCR     Status: None   Collection Time    03/22/14  3:28 AM      Result Value Ref Range Status   C difficile by pcr NEGATIVE  NEGATIVE Final  CULTURE, RESPIRATORY (NON-EXPECTORATED)     Status: None   Collection Time    03/22/14 11:07 AM      Result Value Ref Range Status   Specimen Description TRACHEAL ASPIRATE   Final   Special Requests NONE   Final   Gram Stain     Final   Value: RARE WBC PRESENT, PREDOMINANTLY MONONUCLEAR     RARE SQUAMOUS EPITHELIAL CELLS PRESENT     NO ORGANISMS SEEN     Performed at [facility]   Culture     Final   Value: NO GROWTH 2 DAYS     Performed at [facility]  Report Status 03/24/2014 FINAL   Final  URINE CULTURE     Status: None   Collection Time    03/23/14  1:31 PM      Result Value Ref Range Status   Specimen Description URINE, RANDOM   Final   Special Requests NONE   Final   Culture  Setup Time     Final   Value: 03/23/2014 17:22     [facility]formed at [facility]   Colony Count     Final   Value: NO GROWTH     [facility]   Culture      Final   Value: NO GROWTH     [facility]formed at [facility]   Report Status 03/24/2014 FINAL   Final  CULTURE, BLOOD (ROUTINE X 2)     Status: None   Collection Time    03/23/14  2:47 PM      Result Value Ref Range Status   Specimen Description BLOOD RIGHT HAND   Final   Special Requests BOTTLES DRAWN AEROBIC AND ANAEROBIC 10CC   Final   Culture  Setup Time     Final   Value: 03/23/2014 20:15     [facility]formed at [facility]   Culture     Final   Value:        BLOOD CULTURE RECEIVED NO GROWTH TO DATE CULTURE WILL BE HELD FOR 5 DAYS BEFORE ISSUING A FINAL NEGATIVE REPORT     [facility]formed [facility]   Report Status PENDING   Incomplete  CULTURE, BLOOD (ROUTINE X 2)     Status: None   Collection Time    03/23/14  2:50 PM      Result Value Ref Range Status   Specimen Description BLOOD RIGHT WRIST   Final   Special Requests BOTTLES DRAWN AEROBIC ONLY 4CC   Final   Culture  Setup Time     Final   Value: 03/23/2014 20:14     Performed at [facility]   Culture     Final   Value:        BLOOD CULTURE RECEIVED NO GROWTH TO DATE CULTURE WILL BE HELD FOR 5 DAYS BEFORE ISSUING A FINAL NEGATIVE REPORT     [facility]formed at [facility]   Report Status PENDING   Incomplete    Coagulation Studies: No results found for this basename: LABPROT, INR,  in the last 72 hours  Imaging: Mr Brain Wo Contrast  03/26/2014   CLINICAL DATA:  Initial evaluation for seizure and altered mental status. [facility] status epilepticus and NSTEMI.  EXAM: MRI HEAD WITHOUT CONTRAST  TECHNIQUE: Multiplanar, multiecho pulse sequences of the brain and surrounding structures were obtained without intravenous contrast.  COMPARISON:  Prior CT from 03/21/2014  FINDINGS: Mild generalized atrophy noted. Mild small vessel changes again seen. No mass lesion, midline shift, or extra-axial fluid collection. Ventricles are normal in size without evidence of hydrocephalus.  Hippocampi are symmetric in size and morphology bilaterally without signal abnormality.  No diffusion-weighted signal abnormality is identified to suggest acute intracranial infarct. Gray-white matter differentiation is maintained. Normal flow voids are seen within the intracranial vasculature. No intracranial hemorrhage identified.  The cervicomedullary junction is normal. Pituitary gland is within normal limits. Pituitary stalk is midline. The globes and optic nerves demonstrate a normal appearance with normal signal intensity.  The bone marrow signal intensity is normal. Calvarium is intact. Visualized upper cervical  spine is within normal limits.  Scalp soft tissues are unremarkable.  Mild mucoperiosteal thickening present within the ethmoidal air cells bilaterally. Concha bullosa noted. Layering fluid density within the posterior nasopharynx likely related to intubation. Bilateral mastoid effusions present.  IMPRESSION: 1. No acute intracranial infarct or other abnormality identified. 2. Mild chronic microvascular ischemic changes. 3. Bilateral mastoid effusions.   Electronically Signed   By: Rise MuBenjamin  McClintock M.D.   On: 03/26/2014 01:15   Dg Chest Port 1 View  03/26/2014   CLINICAL DATA:  Subsequent encounter for respiratory failure.  EXAM: PORTABLE CHEST - 1 VIEW  COMPARISON:  One-view chest 03/25/2014.  FINDINGS: Endotracheal tube, right IJ line, and NG tube are stable. The heart size is normal. Mild pulmonary vascular congestion persists.  IMPRESSION: 1. Stable mild pulmonary vascular congestion. 2. Stable support apparatus.   Electronically Signed   By: Gennette Pachris  Mattern M.D.   On: 03/26/2014 08:03   Dg Chest Port 1 View  03/25/2014   CLINICAL DATA:  Respiratory failure.  EXAM: PORTABLE CHEST - 1 VIEW  COMPARISON:  03/24/2014 and 03/22/2014  FINDINGS: Heart size and pulmonary vascularity are normal. There is improved aeration in the left midzone with slight new density at the right base. Vague  density at the right upper lobe laterally is unchanged.  Given the waxing and waning appearance of these areas of hazy infiltrate bilaterally, I suspect these represent areas of atelectasis or pneumonitis.  No discrete effusion.  IMPRESSION: Improved aeration on the left. New slight haziness at the right base suggesting atelectasis or pneumonitis.   Electronically Signed   By: Geanie CooleyJim  Maxwell M.D.   On: 03/25/2014 08:04    Medications:  I have reviewed the patient's current medications. Scheduled: . antiseptic oral rinse  7 mL Mouth Rinse QID  . artificial tears   Both Eyes 3 times per day  . aspirin  325 mg Per Tube Daily  . atorvastatin  80 mg Per Tube q1800  . chlorhexidine  15 mL Mouth Rinse BID  . feeding supplement (PRO-STAT SUGAR FREE 64)  30 mL Per Tube TID  . feeding supplement (VITAL HIGH PROTEIN)  1,000 mL Per Tube Q24H  . free water  200 mL Per Tube 3 times per day  . furosemide  40 mg Intravenous Q6H  . insulin aspart  0-15 Units Subcutaneous 6 times per day  . insulin glargine  40 Units Subcutaneous Daily  . lacosamide (VIMPAT) IV  100 mg Intravenous Q12H  . levETIRAcetam  1,500 mg Intravenous Q12H  . multivitamin  5 mL Per Tube Daily  . pantoprazole sodium  40 mg Per Tube QHS  . phenytoin (DILANTIN) IV  100 mg Intravenous 3 times per day  . piperacillin-tazobactam (ZOSYN)  IV  3.375 g Intravenous Q8H  . potassium chloride  40 mEq Per Tube TID  . topiramate  200 mg Per Tube BID    Assessment/Plan: Patient more responsive.  Now off Propofol and Fentanyl.  MRI of the brain reviewed and shows no acute abnormalities.    Recommendations: 1.  Agree with continued attempts to wean off sedation 2.  Continue current antiepileptic therapy.    Case discussed with Dr. Molli KnockYacoub   LOS: 8 days   Thana FarrLeslie Reynolds, MD [facility] [phone] 03/26/2014  9:51 AM

## 2014-03-26 NOTE — Progress Notes (Signed)
ANTIBIOTIC CONSULT NOTE - FOLLOW UP  Pharmacy Consult for Zosyn Indication: pneumonia  No Known Allergies  Patient Measurements: Height: 5' 0.63" (154 cm) (from data on 12/15/13) Weight: 217 lb 6 oz (98.6 kg) IBW/kg (Calculated) : 46.95  Vital Signs: Temp: 98.7 F (37.1 C) (10/10 0800) Temp Source: Oral (10/10 0800) BP: 112/65 mmHg (10/10 1200) Pulse Rate: 76 (10/10 1200) Intake/Output from previous day: 10/09 0701 - 10/10 0700 In: 1944.7 [I.V.:294.7; NG/GT:1265; IV Piggyback:385] Out: 3865 [Urine:3865] Intake/Output from this shift: Total I/O In: 447.4 [I.V.:22.4; NG/GT:325; IV Piggyback:100] Out: 680 [Urine:680]  Labs:  Recent Labs  03/24/14 0510 03/25/14 0540 03/26/14 0213  WBC 10.3 8.4 10.1  HGB 8.0* 8.1* 8.1*  PLT 202 263 292  CREATININE 0.82 0.92 0.80   Estimated Creatinine Clearance: 68.8 ml/min (by C-G formula based on Cr of 0.8).  Recent Labs  03/24/14 1220  VANCORANDOM 13.9     Microbiology: Recent Results (from the past 720 hour(s))  MRSA PCR SCREENING     Status: Abnormal   Collection Time    03/18/14  3:08 AM      Result Value Ref Range Status   MRSA by PCR POSITIVE (*) NEGATIVE Final   Comment:            The GeneXpert MRSA Assay (FDA     approved for NASAL specimens     only), is one component of a     comprehensive MRSA colonization     surveillance program. It is not     intended to diagnose MRSA     infection nor to guide or     monitor treatment for     MRSA infections.     RESULT CALLED TO, READ BACK BY AND VERIFIED WITH:     Karie FetchLAUREN HOUT,RN AT [id number] 03/18/14 BY K BARR  CSF CULTURE     Status: None   Collection Time    03/20/14  5:21 PM      Result Value Ref Range Status   Specimen Description CSF   Final   Special Requests Normal   Final   Gram Stain     Final   Value: CYTOSPIN SLIDE WBC PRESENT,BOTH PMN AND MONONUCLEAR     NO ORGANISMS SEEN     [facility]     [facility]   Culture      Final   Value: NO GROWTH 3 DAYS     Performed at [facility]   Report Status 03/24/2014 FINAL   Final  GRAM STAIN     Status: None   Collection Time    03/20/14  5:21 PM      Result Value Ref Range Status   Specimen Description CSF   Final   Special Requests NONE   Final   Gram Stain     Final   Value: CYTOSPIN SLIDE     WBC PRESENT,BOTH PMN AND MONONUCLEAR     NO ORGANISMS SEEN   Report Status 03/20/2014 FINAL   Final  CLOSTRIDIUM DIFFICILE BY PCR     Status: None   Collection Time    03/22/14  3:28 AM      Result Value Ref Range Status   C difficile by pcr NEGATIVE  NEGATIVE Final  CULTURE, RESPIRATORY (NON-EXPECTORATED)     Status: None   Collection Time    03/22/14 11:07 AM      Result Value Ref Range Status   Specimen Description TRACHEAL  ASPIRATE   Final   Special Requests NONE   Final   Gram Stain     Final   Value: RARE WBC PRESENT, PREDOMINANTLY MONONUCLEAR     RARE SQUAMOUS EPITHELIAL CELLS PRESENT     NO ORGANISMS SEEN     Performed at [facility]   Culture     Final   Value: NO GROWTH 2 DAYS     Performed at [facility]   Report Status 03/24/2014 FINAL   Final  URINE CULTURE     Status: None   Collection Time    03/23/14  1:31 PM      Result Value Ref Range Status   Specimen Description URINE, RANDOM   Final   Special Requests NONE   Final   Culture  Setup Time     Final   Value: 03/23/2014 17:22     Performed at [facility] Count     Final   Value: NO GROWTH     Performed at [facility]   Culture     Final   Value: NO GROWTH     Performed at [facility]   Report Status 03/24/2014 FINAL   Final  CULTURE, BLOOD (ROUTINE X 2)     Status: None   Collection Time    03/23/14  2:47 PM      Result Value Ref Range Status   Specimen Description BLOOD RIGHT HAND   Final   Special Requests BOTTLES DRAWN AEROBIC AND ANAEROBIC 10CC   Final   Culture  Setup Time     Final   Value: 03/23/2014  20:15     Performed at [facility]   Culture     Final   Value:        BLOOD CULTURE RECEIVED NO GROWTH TO DATE CULTURE WILL BE HELD FOR 5 DAYS BEFORE ISSUING A FINAL NEGATIVE REPORT     Performed at [facility]   Report Status PENDING   Incomplete  CULTURE, BLOOD (ROUTINE X 2)     Status: None   Collection Time    03/23/14  2:50 PM      Result Value Ref Range Status   Specimen Description BLOOD RIGHT WRIST   Final   Special Requests BOTTLES DRAWN AEROBIC ONLY 4CC   Final   Culture  Setup Time     Final   Value: 03/23/2014 20:14     Performed at [facility]   Culture     Final   Value:        BLOOD CULTURE RECEIVED NO GROWTH TO DATE CULTURE WILL BE HELD FOR 5 DAYS BEFORE ISSUING A FINAL NEGATIVE REPORT     Performed at [facility]   Report Status PENDING   Incomplete    Anti-infectives   Start     Dose/Rate Route Frequency Ordered Stop   03/23/14 1300  piperacillin-tazobactam (ZOSYN) IVPB 3.375 g     3.375 g 12.5 mL/hr over 240 Minutes Intravenous Every 8 hours 03/23/14 1214     03/23/14 1300  vancomycin (VANCOCIN) 1,250 mg in sodium chloride 0.9 % 250 mL IVPB  Status:  Discontinued     1,250 mg 166.7 mL/hr over 90 Minutes Intravenous Every 12 hours 03/23/14 1214 03/25/14 1133   03/20/14 1800  acyclovir (ZOVIRAX) 470 mg in dextrose 5 % 100 mL IVPB  Status:  Discontinued     10 mg/kg  46.9 kg (Ideal)  109.4 mL/hr over 60 Minutes Intravenous Every 8 hours 03/20/14 1039 03/24/14 1000   03/20/14 1200  ampicillin (OMNIPEN) 2 g in sodium chloride 0.9 % 50 mL IVPB  Status:  Discontinued     2 g 150 mL/hr over 20 Minutes Intravenous Every 4 hours 03/20/14 1042 03/21/14 1055   03/20/14 1200  vancomycin (VANCOCIN) 1,250 mg in sodium chloride 0.9 % 250 mL IVPB  Status:  Discontinued     1,250 mg 166.7 mL/hr over 90 Minutes Intravenous Every 12 hours 03/20/14 1044 03/21/14 1055   03/18/14 1630  vancomycin (VANCOCIN) 1,250 mg in sodium chloride 0.9 % 250  mL IVPB  Status:  Discontinued     1,250 mg 166.7 mL/hr over 90 Minutes Intravenous Every 24 hours 03/18/14 1535 03/20/14 1044   03/18/14 1630  acyclovir (ZOVIRAX) 470 mg in dextrose 5 % 100 mL IVPB  Status:  Discontinued     10 mg/kg  46.9 kg (Ideal) 109.4 mL/hr over 60 Minutes Intravenous Every 12 hours 03/18/14 1535 03/20/14 1039   03/18/14 1600  cefTRIAXone (ROCEPHIN) 2 g in dextrose 5 % 50 mL IVPB  Status:  Discontinued     2 g 100 mL/hr over 30 Minutes Intravenous Every 24 hours 03/18/14 1519 03/18/14 1547   03/18/14 1600  ampicillin (OMNIPEN) 2 g in sodium chloride 0.9 % 50 mL IVPB  Status:  Discontinued     2 g 150 mL/hr over 20 Minutes Intravenous Every 6 hours 03/18/14 1535 03/20/14 1042   03/18/14 1600  cefTRIAXone (ROCEPHIN) 2 g in dextrose 5 % 50 mL IVPB  Status:  Discontinued     2 g 100 mL/hr over 30 Minutes Intravenous Every 12 hours 03/18/14 1547 03/21/14 1055      Assessment: 73 y.o. female on Zosyn Day #4 (plan for 7 days per CCM note) for LLL PNA. WBC wnl.  PCT 0.37.   Acyclovir 10/2>>10/8 Amp 10/2>>10/5 Rocephin 10/2>>10/5 vanc 10/2>>10/5; 10/7>>10/9 Zosyn 10/7>>   10/7 Urine>>neg 10/4 CSF>>neg 10/6 TA>>neg 10/7 Bld x2>>ngtd  Goal of Therapy:  Resolution of infection  Plan:  - continue Zosyn 3.375 gm IV Q 8 hours   - f/u renal fxn and addition of 10-day stop date  Christoper Fabianaron Amend, PharmD, BCPS Clinical pharmacist, pager [phone] 03/26/2014,2:15 PM

## 2014-03-26 NOTE — Progress Notes (Signed)
PULMONARY / CRITICAL CARE MEDICINE   Name: Brooke Gross MRN: [id number] DOB: 08/08/1940    ADMISSION DATE:  03/18/2014  [facility]  CHIEF COMPLAINT:  Seizure  INITIAL PRESENTATION:  73 [facility] and NSTEMI.  Intubated for airway protection.  PCCM asked to admit to ICU.  SIGNIFICANT EVENTS/STUDIES:  10[facility]02 [facility] after arrival. Dopamine initiated for hypotension. Vanc, acyclovir, ceftriaxone and ampicillin initiated empirically 10/02 CT head [facility]): no acute abn 10/02 Neuro consult: Etiology of seizure activity is unclear. Acute ischemic cerebral infarction cannot be ruled out.  Recommendations: Continue Keppra IV at 1000 mg every 12 hours. Continue Vimpat at 100 mg every 12 hours. Propofol IV for sedation as well as as seizure control. EEG to rule out continued subclinical seizure activity. MRI of the brain without contrast to rule out acute cerebral infarction. 10/02 Cards consult: Her biomarkers suggest a fairly large MI and ECG has many high risk features. 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 ASA, statin, heparin initiated. Beta blocker not option due to hypotension.  10/02 TTE:  LVEF is approximately 50 to 55% with inferior/inferoseptal hypokinesis, grade 1 diastolic dysfx 10/03 No WUA. Remained on dopamine 10/03 Persistent seizure-like activity despite mutliple anti-consvulsants. Burst suppression initiated. LP recommended to R/O infectious etiology 10/03 cont EEG: Burst suppression, asymmetric, with greater suppression on the right. Semi-periodic epileptiform discharges, left hemisphere, maximal over the mid/posterior temporal region. The findings indicate a severe diffuse encephalopathy with epileptogenic potential noted over the left hemisphere, particularly over the left posterior temporal-occipital region. No ongoing seizure activity was observed that would explain an altered  mental status 10/04 Remained in medically induced coma and on dopamine.  10/04 LP performed by Neuro. CSF: glu 123, protein 30, RBC 3, WBC 8, 52% lymphs 10/05 Unable to tolerated decrease in propofol due to tachypnea. Ketamine initiated. Propofol weaned to off. DA transitioned to NE. Anti-bacterial abx discontinued. Acyclovir continued 10/05 CT head: no acute abn 10/06 Low grade fever. Resp cx and C diff PCR ordered. Ketamine stopped. Midaz gtt @ 10 mg/hr. No overt seizures. Continuous EEG 10/08 No further seizure. Remained encephalopathic. Tachypneic on WUA/SBT. Nearly off vasopressors. Remained severely volume overloaded. Lasix ordered 10/09 Continuous EEG discontinued. Tachypneic and excessive coughing when dexmedetomidine gtt discontinued. Son updated in detail @ bedside. He indicated that he would not want to pursue trach tube and long term care if neuro prognosis for functional recovery is not good 10/09 MRI brain >> negative 10/10 opening eyes to command  INDWELLING DEVICES:: ETT 10/02 >>  R IJ CVL 10/02 >>   MICRO DATA: PCT 10/07: 0.42,  10/08: 0.32.  10/09: 0.37 MRSA PCR 10/02 >> POS CSF 10/04 >> NEG HSV PCR (CSF) 10/04 >> NEG C diff PCR 10/06 >> NEG Resp 10/06 >> NEG Urine 10/07 >> NEG Blood 10/07 >>   ANTIMICROBIALS:  Vanc 10/02 >> 10/05 Ceftriaxone 10/02 >> 10/05 Ampicillin 10/02 >> 10/05 Acyclovir 10/02 >> 10/08 Vancomycin 10/7 >> 10/09 Zosyn 10/7 >>   SUBJECTIVE:  RASS -3. Not F/C. No overt seizures  VITAL SIGNS: Temp:  [98.6 F (37 C)-99.4 F (37.4 C)] 98.7 F (37.1 C) (10/10 0800) Pulse Rate:  [68-87] 73 (10/10 0900) Resp:  [20-37] 34 (10/10 0900) BP: (89-168)/(34-98) 103/36 mmHg (10/10 0900) SpO2:  [97 %-100 %] 98 % (10/10 0900) FiO2 (%):  [40 %] 40 % (10/10 0900) Weight:  [98.6 kg (217 lb 6 oz)] 98.6 kg (217 lb  6 oz) (10/10 0422)  VENTILATOR SETTINGS: Vent Mode:  [-] PSV;CPAP FiO2 (%):  [40 %] 40 % Set Rate:  [14 bmp] 14 bmp Vt Set:  [550 mL] 550  mL PEEP:  [5 cmH20] 5 cmH20 Pressure Support:  [10 cmH20] 10 cmH20 Plateau Pressure:  [20 cmH20-26 cmH20] 24 cmH20  INTAKE / OUTPUT:  Intake/Output Summary (Last 24 hours) at 03/26/14 0914 Last data filed at 03/26/14 0800  Gross per 24 hour  Intake 1778.73 ml  Output   4015 ml  Net -2236.27 ml   PHYSICAL EXAMINATION: General: Synchronous with the vent, opens eyes to command Neuro: RASS -2, MAEs HEENT: NCAT, PERRL Cardiovascular:  Regular, no murmur Lungs:  Scattered rhonchi Abdomen:  Soft, non tender, +BS Ext: Warm, no edema  LABS: I have reviewed all of today's lab results. Relevant abnormalities are discussed in the A/P section  CXR: NSC hazy LLL opacity  ASSESSMENT / PLAN:  PULMONARY A: Acute respiratory failure Pulm edema, improved LLL AS dz - suspect PNA P:   Begin PS trials as tolerated Adjust vent for ABG Cont vent bundle Daily SBT if/when meets criteria  CARDIOVASCULAR A:  NSTEMI  Hx of HTN, HLD. Hypotension, improving P:  Cards following from distance - call back for further eval if neuro recovery is good Wean NE to off for SBP > 100 mmHg  RENAL A:   CKD, Cr appears to be @ baseline Hypokalemia, resolved Hypervolemia P:   Monitor BMET intermittently Monitor I/Os Correct electrolytes as indicated Lasix 40 mg IV q6 x3 doses D/C scheduled potassium  GASTROINTESTINAL A:   No issues P:   SUP: enteral PPI Cont TFs   HEMATOLOGIC A:   Mild ICU associated anemia P:  DVT px: SQ heparin Monitor CBC intermittently Transfuse per usual ICU guidelines noting recent NSTEMI  INFECTIOUS A: Fever - trending down Leukocytosis - resolved Suspected LLL PNA P:   Micro and abx as above DC vanc 10/09 Complete 7 days pip-tazo  ENDOCRINE A:   DM type II, marginal control  P:   Cont SSI Cont Lantus  NEUROLOGIC A:   Status epilepticus,controlled Hx of Bipolar, essential tremor, anxiety, Migraine, Vertigo. P:   RASS goal: -1 Cont Precedex  and PRN fent AED's per neurology Daily WUA  Family updated: No family bedside today  Interdisciplinary Family Meeting v Palliative Care Meeting:   CC time : 35 mins  Alyson ReedyWesam G. Yacoub, M.D. [facility]monary[facility]. Pager: [phone]. After hours pager: [phone].

## 2014-03-27 ENCOUNTER — Inpatient Hospital Stay (HOSPITAL_COMMUNITY): Payer: Medicare Other

## 2014-03-27 LAB — BLOOD GAS, ARTERIAL
Acid-base deficit: 5.6 mmol/L — ABNORMAL HIGH (ref 0.0–2.0)
Bicarbonate: 17.7 mEq/L — ABNORMAL LOW (ref 20.0–24.0)
Drawn by: 33176
FIO2: 0.4 %
MECHVT: 550 mL
O2 Saturation: 99.7 %
PEEP: 5 cmH2O
Patient temperature: 99.5
RATE: 14 resp/min
TCO2: 18.4 mmol/L (ref 0–100)
pCO2 arterial: 26.4 mmHg — ABNORMAL LOW (ref 35.0–45.0)
pH, Arterial: 7.443 (ref 7.350–7.450)
pO2, Arterial: 155 mmHg — ABNORMAL HIGH (ref 80.0–100.0)

## 2014-03-27 LAB — BASIC METABOLIC PANEL
Anion gap: 13 (ref 5–15)
BUN: 26 mg/dL — ABNORMAL HIGH (ref 6–23)
CO2: 17 mEq/L — ABNORMAL LOW (ref 19–32)
Calcium: 8.6 mg/dL (ref 8.4–10.5)
Chloride: 112 mEq/L (ref 96–112)
Creatinine, Ser: 0.93 mg/dL (ref 0.50–1.10)
GFR calc Af Amer: 70 mL/min — ABNORMAL LOW (ref >90)
GFR calc non Af Amer: 60 mL/min — ABNORMAL LOW (ref >90)
Glucose, Bld: 158 mg/dL — ABNORMAL HIGH (ref 70–99)
Potassium: 4.1 mEq/L (ref 3.7–5.3)
Sodium: 142 mEq/L (ref 137–147)

## 2014-03-27 LAB — CBC
HCT: 24 % — ABNORMAL LOW (ref 36.0–46.0)
Hemoglobin: 7.8 g/dL — ABNORMAL LOW (ref 12.0–15.0)
MCH: 28.1 pg (ref 26.0–34.0)
MCHC: 32.5 g/dL (ref 30.0–36.0)
MCV: 86.3 fL (ref 78.0–100.0)
Platelets: 342 10*3/uL (ref 150–400)
RBC: 2.78 MIL/uL — ABNORMAL LOW (ref 3.87–5.11)
RDW: 15.5 % (ref 11.5–15.5)
WBC: 13.3 10*3/uL — ABNORMAL HIGH (ref 4.0–10.5)

## 2014-03-27 LAB — GLUCOSE, CAPILLARY
Glucose-Capillary: 117 mg/dL — ABNORMAL HIGH (ref 70–99)
Glucose-Capillary: 141 mg/dL — ABNORMAL HIGH (ref 70–99)
Glucose-Capillary: 151 mg/dL — ABNORMAL HIGH (ref 70–99)
Glucose-Capillary: 152 mg/dL — ABNORMAL HIGH (ref 70–99)
Glucose-Capillary: 155 mg/dL — ABNORMAL HIGH (ref 70–99)
Glucose-Capillary: 162 mg/dL — ABNORMAL HIGH (ref 70–99)
Glucose-Capillary: 194 mg/dL — ABNORMAL HIGH (ref 70–99)

## 2014-03-27 LAB — CLOSTRIDIUM DIFFICILE BY PCR: Toxigenic C. Difficile by PCR: NEGATIVE

## 2014-03-27 LAB — PHOSPHORUS: Phosphorus: 2.3 mg/dL (ref 2.3–4.6)

## 2014-03-27 LAB — MAGNESIUM: Magnesium: 1.4 mg/dL — ABNORMAL LOW (ref 1.5–2.5)

## 2014-03-27 NOTE — Progress Notes (Signed)
Subjective: Sedation being tapered.  Patient now able to respond and following some commands.  No seizure activity noted.    Objective: Current vital signs: BP 112/58  Pulse 75  Temp(Src) 98.7 F (37.1 C) (Oral)  Resp 31  Ht 5' 0.63" (1.54 m)  Wt 95.5 kg (210 lb 8.6 oz)  BMI 40.27 kg/m2  SpO2 98% Vital signs in last 24 hours: Temp:  [98.1 F (36.7 C)-98.7 F (37.1 C)] 98.7 F (37.1 C) (10/11 0757) Pulse Rate:  [56-83] 75 (10/11 1100) Resp:  [16-32] 31 (10/11 1100) BP: (91-120)/(36-58) 112/58 mmHg (10/11 1100) SpO2:  [98 %-100 %] 98 % (10/11 1100) FiO2 (%):  [30 %-40 %] 30 % (10/11 1100) Weight:  [95.5 kg (210 lb 8.6 oz)] 95.5 kg (210 lb 8.6 oz) (10/11 0500)  Intake/Output from previous day: 10/10 0701 - 10/11 0700 In: 2208.2 [I.V.:108.2; NG/GT:1680; IV Piggyback:420] Out: 3345 [Urine:3345] Intake/Output this shift: Total I/O In: 280.6 [I.V.:5.6; NG/GT:140; IV Piggyback:135] Out: 200 [Urine:200] Nutritional status: NPO  Neurologic Exam: Mental Status:  With calling her name the patient opens her eyes. Follows simple commands. No verbalizations noted.  Cranial Nerves:  II: Blinks to confrontation bilaterally, pupils right 3 mm, left 3 mm,and reactive bilaterally  III,IV,VI: doll's response present bilaterally.  V,VII: corneal reflex intact bilaterally  VIII: patient does not respond to verbal stimuli  IX,X: gag reflex unable to test, XI: trapezius strength unable to test bilaterally  XII: tongue strength unable to test  Motor:  Moves all extremities weakly to command.   Sensory:  Appreciates light stimuli throughout Plantars:  upgoing bilaterally  Cerebellar:  Unable to perform    Lab Results: Basic Metabolic Panel:  Recent Labs Lab 03/21/14 0313 03/22/14 0531 03/22/14 0850 03/24/14 0510 03/25/14 0540 03/26/14 0213 03/27/14 0242  NA 142 145  --  140 140 144 142  K 3.5* 3.5*  --  3.4* 4.0 3.4* 4.1  CL 110 113*  --  112 111 113* 112  CO2 21 20  --   16* 16* 15* 17*  GLUCOSE 242* 231*  --  240* 212* 168* 158*  BUN 11 14  --  17 21 22  26*  CREATININE 0.76 0.90  --  0.82 0.92 0.80 0.93  CALCIUM 7.2* 6.8*  --  7.2* 7.7* 7.8* 8.6  MG 1.5  --  1.1*  --   --   --  1.4*  PHOS 1.7*  --   --   --   --   --  2.3    Liver Function Tests:  Recent Labs Lab 03/21/14 0313  AST 48*  ALT 58*  ALKPHOS 61  BILITOT <0.2*  PROT 5.7*  ALBUMIN 2.1*   No results found for this basename: LIPASE, AMYLASE,  in the last 168 hours No results found for this basename: AMMONIA,  in the last 168 hours  CBC:  Recent Labs Lab 03/21/14 0313 03/23/14 1200 03/24/14 0510 03/25/14 0540 03/26/14 0213 03/27/14 0242  WBC 13.1* 14.1* 10.3 8.4 10.1 13.3*  NEUTROABS  --  10.9*  --   --   --   --   HGB 10.8* 8.7* 8.0* 8.1* 8.1* 7.8*  HCT 33.0* 26.3* 25.1* 24.6* 24.9* 24.0*  MCV 87.5 87.1 86.6 87.2 85.0 86.3  PLT 224 210 202 263 292 342    Cardiac Enzymes: No results found for this basename: CKTOTAL, CKMB, CKMBINDEX, TROPONINI,  in the last 168 hours  Lipid Panel:  Recent Labs Lab 03/21/14 0314 03/23/14 0630  TRIG 134 111    CBG:  Recent Labs Lab 03/26/14 1554 03/26/14 2036 03/26/14 2330 03/27/14 0320 03/27/14 0755  GLUCAP 134* 194* 186* 152* 151*    Microbiology: Results for orders placed during the hospital encounter of 03/18/14  MRSA PCR SCREENING     Status: Abnormal   Collection Time    03/18/14  3:08 AM      Result Value Ref Range Status   MRSA by PCR POSITIVE (*) NEGATIVE Final   Comment:            The GeneXpert MRSA Assay (FDA     approved for NASAL specimens     only), is one component of a     comprehensive MRSA colonization     surveillance program. It is not     intended to diagnose MRSA     infection nor to guide or     monitor treatment for     MRSA infections.     RESULT CALLED TO, READ BACK BY AND VERIFIED WITH:     Karie Fetch AT 1610 03/18/14 BY K BARR  CSF CULTURE     Status: None   Collection Time     03/20/14  5:21 PM      Result Value Ref Range Status   Specimen Description CSF   Final   Special Requests Normal   Final   Gram Stain     Final   Value: CYTOSPIN SLIDE WBC PRESENT,BOTH PMN AND MONONUCLEAR     NO ORGANISMS SEEN     Performed at [facility]     Performed at [facility]   Culture     Final   Value: NO GROWTH 3 DAYS     Performed at [facility]   Report Status 03/24/2014 FINAL   Final  GRAM STAIN     Status: None   Collection Time    03/20/14  5:21 PM      Result Value Ref Range Status   Specimen Description CSF   Final   Special Requests NONE   Final   Gram Stain     Final   Value: CYTOSPIN SLIDE     WBC PRESENT,BOTH PMN AND MONONUCLEAR     NO ORGANISMS SEEN   Report Status 03/20/2014 FINAL   Final  CLOSTRIDIUM DIFFICILE BY PCR     Status: None   Collection Time    03/22/14  3:28 AM      Result Value Ref Range Status   C difficile by pcr NEGATIVE  NEGATIVE Final  CULTURE, RESPIRATORY (NON-EXPECTORATED)     Status: None   Collection Time    03/22/14 11:07 AM      Result Value Ref Range Status   Specimen Description TRACHEAL ASPIRATE   Final   Special Requests NONE   Final   Gram Stain     Final   Value: RARE WBC PRESENT, PREDOMINANTLY MONONUCLEAR     RARE SQUAMOUS EPITHELIAL CELLS PRESENT     NO ORGANISMS SEEN     Performed at [facility]   Culture     Final   Value: NO GROWTH 2 DAYS     Performed at [facility]   Report Status 03/24/2014 FINAL   Final  URINE CULTURE     Status: None   Collection Time    03/23/14  1:31 PM      Result Value Ref Range Status   Specimen Description URINE, RANDOM  Final   Special Requests NONE   Final   Culture  Setup Time     Final   Value: 03/23/2014 17:22     Performed at [facility]   Colony Count     Final   Value: NO GROWTH     Performed at [facility]   Culture     Final   Value: NO GROWTH     Performed at [facility]   Report Status  03/24/2014 FINAL   Final  CULTURE, BLOOD (ROUTINE X 2)     Status: None   Collection Time    03/23/14  2:47 PM      Result Value Ref Range Status   Specimen Description BLOOD RIGHT HAND   Final   Special Requests BOTTLES DRAWN AEROBIC AND ANAEROBIC 10CC   Final   Culture  Setup Time     Final   Value: 03/23/2014 20:15     Performed at [facility]   Culture     Final   Value:        BLOOD CULTURE RECEIVED NO GROWTH TO DATE CULTURE WILL BE HELD FOR 5 DAYS BEFORE ISSUING A FINAL NEGATIVE REPORT     Performed at [facility]   Report Status PENDING   Incomplete  CULTURE, BLOOD (ROUTINE X 2)     Status: None   Collection Time    03/23/14  2:50 PM      Result Value Ref Range Status   Specimen Description BLOOD RIGHT WRIST   Final   Special Requests BOTTLES DRAWN AEROBIC ONLY 4CC   Final   Culture  Setup Time     Final   Value: 03/23/2014 20:14     Performed at [facility]   Culture     Final   Value:        BLOOD CULTURE RECEIVED NO GROWTH TO DATE CULTURE WILL BE HELD FOR 5 DAYS BEFORE ISSUING A FINAL NEGATIVE REPORT     Performed at [facility]   Report Status PENDING   Incomplete    Coagulation Studies: No results found for this basename: LABPROT, INR,  in the last 72 hours  Imaging: Mr Brain Wo Contrast  03/26/2014   CLINICAL DATA:  Initial evaluation for seizure and altered mental status. [facility] with [facility] epilepticus and NSTEMI.  EXAM: MRI HEAD WITHOUT CONTRAST  TECHNIQUE: Multiplanar, multiecho pulse sequences of the brain and surrounding structures were obtained without intravenous contrast.  COMPARISON:  Prior CT from 03/21/2014  FINDINGS: Mild generalized atrophy noted. Mild small vessel changes again seen. No mass lesion, midline shift, or extra-axial fluid collection. Ventricles are normal in size without evidence of hydrocephalus. Hippocampi are symmetric in size and morphology bilaterally without signal  abnormality.  No diffusion-weighted signal abnormality is identified to suggest acute intracranial infarct. Gray-white matter differentiation is maintained. Normal flow voids are seen within the intracranial vasculature. No intracranial hemorrhage identified.  The cervicomedullary junction is normal. Pituitary gland is within normal limits. Pituitary stalk is midline. The globes and optic nerves demonstrate a normal appearance with normal signal intensity.  The bone marrow signal intensity is normal. Calvarium is intact. Visualized upper cervical spine is within normal limits.  Scalp soft tissues are unremarkable.  Mild mucoperiosteal thickening present within the ethmoidal air cells bilaterally. Concha bullosa noted. Layering fluid density within the posterior nasopharynx likely related to intubation. Bilateral mastoid effusions present.  IMPRESSION: 1. No acute  intracranial infarct or other abnormality identified. 2. Mild chronic microvascular ischemic changes. 3. Bilateral mastoid effusions.   Electronically Signed   By: Rise MuBenjamin  McClintock M.D.   On: 03/26/2014 01:15   Dg Chest Port 1 View  03/27/2014   CLINICAL DATA:  Hypoxia  EXAM: PORTABLE CHEST - 1 VIEW  COMPARISON:  March 26, 2014  FINDINGS: Endotracheal tube tip is 2.9 cm above the carina. Central catheter tip is in the superior vena cava. Nasogastric tube tip and side port are below the diaphragm. No pneumothorax. There is no edema or consolidation. Heart is upper normal in size with pulmonary vascularity within normal limits. No adenopathy.  IMPRESSION: Tube and catheter positions as described without pneumothorax. No edema or consolidation.   Electronically Signed   By: Bretta BangWilliam  Woodruff M.D.   On: 03/27/2014 08:16   Dg Chest Port 1 View  03/26/2014   CLINICAL DATA:  Subsequent encounter for respiratory failure.  EXAM: PORTABLE CHEST - 1 VIEW  COMPARISON:  One-view chest 03/25/2014.  FINDINGS: Endotracheal tube, right IJ line, and NG tube are  stable. The heart size is normal. Mild pulmonary vascular congestion persists.  IMPRESSION: 1. Stable mild pulmonary vascular congestion. 2. Stable support apparatus.   Electronically Signed   By: Gennette Pachris  Mattern M.D.   On: 03/26/2014 08:03   Dg Abd Portable 1v  03/26/2014   CLINICAL DATA:  Orogastric tube placement.  EXAM: PORTABLE ABDOMEN - 1 VIEW  COMPARISON:  None.  FINDINGS: Endotracheal tube noted with its tip 3 cm above the carina. Orogastric tube noted with its tip in the distal stomach. Right IJ line noted with its tip projected over superior vena cava. Cardiomegaly with normal pulmonary vascularity. Mild bilateral patchy pulmonary infiltrates cannot be excluded. No pleural effusion pneumothorax. No acute bony abnormality.  IMPRESSION: 1. Line and tubes in good anatomic position. 2. Patchy bilateral pulmonary infiltrates cannot be excluded. 3. Cardiomegaly with normal pulmonary vascularity.   Electronically Signed   By: Maisie Fushomas  Register   On: 03/26/2014 23:44    Medications:  I have reviewed the patient's current medications. Scheduled: . antiseptic oral rinse  7 mL Mouth Rinse QID  . artificial tears   Both Eyes 3 times per day  . aspirin  325 mg Per Tube Daily  . atorvastatin  80 mg Per Tube q1800  . chlorhexidine  15 mL Mouth Rinse BID  . feeding supplement (PRO-STAT SUGAR FREE 64)  30 mL Per Tube TID  . feeding supplement (VITAL HIGH PROTEIN)  1,000 mL Per Tube Q24H  . free water  200 mL Per Tube 3 times per day  . insulin aspart  0-15 Units Subcutaneous 6 times per day  . insulin glargine  40 Units Subcutaneous Daily  . lacosamide (VIMPAT) IV  100 mg Intravenous Q12H  . levETIRAcetam  1,500 mg Intravenous Q12H  . multivitamin  5 mL Per Tube Daily  . pantoprazole sodium  40 mg Per Tube QHS  . phenytoin (DILANTIN) IV  100 mg Intravenous 3 times per day  . piperacillin-tazobactam (ZOSYN)  IV  3.375 g Intravenous Q8H  . topiramate  200 mg Per Tube BID     Assessment/Plan: Responsiveness continues to improve.  Tapering Precedex.  Magnesium again dropping.  Renal function normal.    Recommendations: 1. Continue AED's at current doses. 2.  Agree with continued checking of Magnesium 3.  Agree with weaning   LOS: 9 days   Thana FarrLeslie Reynolds, MD [facility] [phone] 03/27/2014  12:02 PM

## 2014-03-27 NOTE — Progress Notes (Signed)
PULMONARY / CRITICAL CARE MEDICINE   Name: Brooke DrossHarriett Wank MRN: [id number] DOB: 25-Dec-1940    ADMISSION DATE:  03/18/2014  [facility]  CHIEF COMPLAINT:  Seizure  INITIAL PRESENTATION:  73 [facility]pticus and NSTEMI.  Intubated for airway protection.  PCCM asked to admit to ICU.  SIGNIFICANT EVENTS/STUDIES:  10/02 [facility] after arrival. Dopamine initiated for hypotension. Vanc, acyclovir, ceftriaxone and ampicillin initiated empirically 10/02 CT head [facility]): no acute abn 10/02 Neuro consult: Etiology of seizure activity is unclear. Acute ischemic cerebral infarction cannot be ruled out.  Recommendations: Continue Keppra IV at 1000 mg every 12 hours. Continue Vimpat at 100 mg every 12 hours. Propofol IV for sedation as well as as seizure control. EEG to rule out continued subclinical seizure activity. MRI of the brain without contrast to rule out acute cerebral infarction. 10/02 Cards consult: Her biomarkers suggest a fairly large MI and ECG has many high risk features. 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 ASA, statin, heparin initiated. Beta blocker not option due to hypotension.  10/02 TTE:  LVEF is approximately 50 to 55% with inferior/inferoseptal hypokinesis, grade 1 diastolic dysfx 10/03 No WUA. Remained on dopamine 10/03 Persistent seizure-like activity despite mutliple anti-consvulsants. Burst suppression initiated. LP recommended to R/O infectious etiology 10/03 cont EEG: Burst suppression, asymmetric, with greater suppression on the right. Semi-periodic epileptiform discharges, left hemisphere, maximal over the mid/posterior temporal region. The findings indicate a severe diffuse encephalopathy with epileptogenic potential noted over the left hemisphere, particularly over the left posterior temporal-occipital region. No ongoing seizure activity was observed that would explain an altered  mental status 10/04 Remained in medically induced coma and on dopamine.  10/04 LP performed by Neuro. CSF: glu 123, protein 30, RBC 3, WBC 8, 52% lymphs 10/05 Unable to tolerated decrease in propofol due to tachypnea. Ketamine initiated. Propofol weaned to off. DA transitioned to NE. Anti-bacterial abx discontinued. Acyclovir continued 10/05 CT head: no acute abn 10/06 Low grade fever. Resp cx and C diff PCR ordered. Ketamine stopped. Midaz gtt @ 10 mg/hr. No overt seizures. Continuous EEG 10/08 No further seizure. Remained encephalopathic. Tachypneic on WUA/SBT. Nearly off vasopressors. Remained severely volume overloaded. Lasix ordered 10/09 Continuous EEG discontinued. Tachypneic and excessive coughing when dexmedetomidine gtt discontinued. Son updated in detail @ bedside. He indicated that he would not want to pursue trach tube and long term care if neuro prognosis for functional recovery is not good 10/09 MRI brain >> negative 10/10 opening eyes to command  INDWELLING DEVICES:: ETT 10/02 >>  R IJ CVL 10/02 >>10/11  MICRO DATA: PCT 10/07: 0.42,  10/08: 0.32.  10/09: 0.37 MRSA PCR 10/02 >> POS CSF 10/04 >> NEG HSV PCR (CSF) 10/04 >> NEG C diff PCR 10/06 >> NEG Resp 10/06 >> NEG Urine 10/07 >> NEG Blood 10/07 >>   ANTIMICROBIALS:  Vanc 10/02 >> 10/05 Ceftriaxone 10/02 >> 10/05 Ampicillin 10/02 >> 10/05 Acyclovir 10/02 >> 10/08 Vancomycin 10/7 >> 10/09 Zosyn 10/7 >>   SUBJECTIVE:  RASS -3. Not F/C. No overt seizures  VITAL SIGNS: Temp:  [98.1 F (36.7 C)-98.7 F (37.1 C)] 98.7 F (37.1 C) (10/11 0757) Pulse Rate:  [56-83] 67 (10/11 0845) Resp:  [16-29] 27 (10/11 0845) BP: (91-120)/(36-65) 94/48 mmHg (10/11 0845) SpO2:  [99 %-100 %] 100 % (10/11 0800) FiO2 (%):  [30 %-40 %] 30 % (10/11 0845) Weight:  [95.5 kg (210 lb 8.6 oz)] 95.5 kg (210 lb  8.6 oz) (10/11 0500)  VENTILATOR SETTINGS: Vent Mode:  [-] PSV;CPAP FiO2 (%):  [30 %-40 %] 30 % Set Rate:  [14 bmp] 14 bmp Vt  Set:  [550 mL] 550 mL PEEP:  [5 cmH20] 5 cmH20 Pressure Support:  [8 cmH20] 8 cmH20 Plateau Pressure:  [18 cmH20-22 cmH20] 20 cmH20  INTAKE / OUTPUT:  Intake/Output Summary (Last 24 hours) at 03/27/14 [id number] Last data filed at 03/27/14 0805  Gross per 24 hour  Intake 2156.43 ml  Output   3195 ml  Net -1038.57 ml   PHYSICAL EXAMINATION: General: Synchronous with the vent, opens eyes to command Neuro: RASS -2, MAEs HEENT: NCAT, PERRL Cardiovascular:  Regular, no murmur Lungs:  Scattered rhonchi Abdomen:  Soft, non tender, +BS Ext: Warm, no edema  LABS: I have reviewed all of today's lab results. Relevant abnormalities are discussed in the A/P section  CXR: NSC hazy LLL opacity  ASSESSMENT / PLAN:  PULMONARY A: Acute respiratory failure Pulm edema, improved LLL AS dz - suspect PNA P:   Begin PS trials as tolerated Will likely trial extubate in AM, if fails then reintubate and proceed with trach. Adjust vent for ABG Cont vent bundle  CARDIOVASCULAR A:  NSTEMI  Hx of HTN, HLD. Hypotension, improving P:  Cards following from distance - call back for further eval if neuro recovery is good Wean NE to off for SBP > 90 mmHg  RENAL A:   CKD, Cr appears to be @ baseline Hypokalemia, resolved Hypervolemia P:   Monitor BMET intermittently Monitor I/Os Correct electrolytes as indicated Hold further lasix given soft BP D/C scheduled potassium  GASTROINTESTINAL A:   No issues P:   SUP: enteral PPI Cont TFs   HEMATOLOGIC A:   Mild ICU associated anemia P:  DVT px: SQ heparin Monitor CBC intermittently Transfuse per usual ICU guidelines noting recent NSTEMI  INFECTIOUS A: Fever - trending down Leukocytosis - resolved Suspected LLL PNA P:   Micro and abx as above DC vanc 10/09 Complete 7 days pip-tazo  ENDOCRINE A:   DM type II, marginal control  P:   Cont SSI Cont Lantus  NEUROLOGIC A:   Status epilepticus,controlled Hx of Bipolar, essential  tremor, anxiety, Migraine, Vertigo. P:   RASS goal: -1 Cont Precedex and PRN fent AED's per neurology Daily WUA  Family updated: No family bedside today  Interdisciplinary Family Meeting v Palliative Care Meeting: N/A  CC time : 35 mins  Alyson ReedyWesam G. Yacoub, M.D. [facility] Pulmonary/Critical Care Medicine. Pager: [phone]. After hours pager: [phone].

## 2014-03-27 NOTE — Progress Notes (Signed)
Notified e-link MD about pt's BP being slightly lower than the goal systolic of 100. MD stated that he was okay with the BP running slightly lower than goal. No new orders were received. Will continue to monitor.

## 2014-03-28 ENCOUNTER — Inpatient Hospital Stay (HOSPITAL_COMMUNITY): Payer: Medicare Other

## 2014-03-28 LAB — CBC
HCT: 28.4 % — ABNORMAL LOW (ref 36.0–46.0)
Hemoglobin: 9.3 g/dL — ABNORMAL LOW (ref 12.0–15.0)
MCH: 28.6 pg (ref 26.0–34.0)
MCHC: 32.7 g/dL (ref 30.0–36.0)
MCV: 87.4 fL (ref 78.0–100.0)
Platelets: 361 10*3/uL (ref 150–400)
RBC: 3.25 MIL/uL — ABNORMAL LOW (ref 3.87–5.11)
RDW: 15.5 % (ref 11.5–15.5)
WBC: 17.8 10*3/uL — ABNORMAL HIGH (ref 4.0–10.5)

## 2014-03-28 LAB — GLUCOSE, CAPILLARY
Glucose-Capillary: 163 mg/dL — ABNORMAL HIGH (ref 70–99)
Glucose-Capillary: 143 mg/dL — ABNORMAL HIGH (ref 70–99)
Glucose-Capillary: 131 mg/dL — ABNORMAL HIGH (ref 70–99)
Glucose-Capillary: 131 mg/dL — ABNORMAL HIGH (ref 70–99)
Glucose-Capillary: 163 mg/dL — ABNORMAL HIGH (ref 70–99)

## 2014-03-28 LAB — BASIC METABOLIC PANEL
Anion gap: 13 (ref 5–15)
BUN: 26 mg/dL — ABNORMAL HIGH (ref 6–23)
CO2: 18 mEq/L — ABNORMAL LOW (ref 19–32)
Calcium: 8.5 mg/dL (ref 8.4–10.5)
Chloride: 112 mEq/L (ref 96–112)
Creatinine, Ser: 0.89 mg/dL (ref 0.50–1.10)
GFR calc Af Amer: 74 mL/min — ABNORMAL LOW (ref >90)
GFR calc non Af Amer: 64 mL/min — ABNORMAL LOW (ref >90)
Glucose, Bld: 172 mg/dL — ABNORMAL HIGH (ref 70–99)
Potassium: 4.3 mEq/L (ref 3.7–5.3)
Sodium: 143 mEq/L (ref 137–147)

## 2014-03-28 LAB — POCT I-STAT 3, ART BLOOD GAS (G3+)
Acid-base deficit: 5 mmol/L — ABNORMAL HIGH (ref 0.0–2.0)
Bicarbonate: 18.8 mEq/L — ABNORMAL LOW (ref 20.0–24.0)
O2 Saturation: 99 %
Patient temperature: 99
TCO2: 20 mmol/L (ref 0–100)
pCO2 arterial: 30.5 mmHg — ABNORMAL LOW (ref 35.0–45.0)
pH, Arterial: 7.398 (ref 7.350–7.450)
pO2, Arterial: 128 mmHg — ABNORMAL HIGH (ref 80.0–100.0)

## 2014-03-28 LAB — PHOSPHORUS: Phosphorus: 3.4 mg/dL (ref 2.3–4.6)

## 2014-03-28 LAB — MAGNESIUM: Magnesium: 1.5 mg/dL (ref 1.5–2.5)

## 2014-03-28 MED ORDER — FUROSEMIDE 10 MG/ML IJ SOLN
20.0000 mg | Freq: Four times a day (QID) | INTRAMUSCULAR | Status: AC
  Administered 2014-03-28 (×3): 20 mg via INTRAVENOUS
  Filled 2014-03-28 (×3): qty 2

## 2014-03-28 MED ORDER — HEPARIN SODIUM (PORCINE) 5000 UNIT/ML IJ SOLN
5000.0000 [IU] | Freq: Three times a day (TID) | INTRAMUSCULAR | Status: DC
  Administered 2014-03-28 – 2014-04-07 (×31): 5000 [IU] via SUBCUTANEOUS
  Filled 2014-03-28 (×37): qty 1

## 2014-03-28 NOTE — Progress Notes (Signed)
Subjective: Patinet mor awake and alert today.  Did not do well with weaning on yesterday but seems to be doing better with trials today.    Objective: Current vital signs: BP 123/43  Pulse 74  Temp(Src) 99.1 F (37.3 C) (Axillary)  Resp 19  Ht 5' 0.63" (1.54 m)  Wt 95.7 kg (210 lb 15.7 oz)  BMI 40.35 kg/m2  SpO2 100% Vital signs in last 24 hours: Temp:  [98.7 F (37.1 C)-99.3 F (37.4 C)] 99.1 F (37.3 C) (10/12 0751) Pulse Rate:  [65-83] 74 (10/12 0900) Resp:  [14-36] 19 (10/12 0900) BP: (92-152)/(38-82) 123/43 mmHg (10/12 0900) SpO2:  [94 %-100 %] 100 % (10/12 0900) FiO2 (%):  [30 %] 30 % (10/12 0900) Weight:  [95.7 kg (210 lb 15.7 oz)] 95.7 kg (210 lb 15.7 oz) (10/12 0317)  Intake/Output from previous day: 10/11 0701 - 10/12 0700 In: 1450.6 [I.V.:225.6; NG/GT:805; IV Piggyback:420] Out: 1460 [Urine:1310; Stool:150] Intake/Output this shift: Total I/O In: 20 [I.V.:20] Out: -  Nutritional status: NPO  Neurologic Exam: Mental Status:  Awake and alert.  With calling her name the patient turns to her name being called. Follows simple commands. No verbalizations noted. Questionable left neglect.  Cranial Nerves:  II: Blinks to confrontation bilaterally, pupils right 3 mm, left 3 mm,and reactive bilaterally  III,IV,VI: doll's response present bilaterally.  V,VII: corneal reflex intact bilaterally  VIII: patient does not respond to verbal stimuli  IX,X: gag reflex unable to test, XI: trapezius strength unable to test bilaterally  XII: tongue strength unable to test  Motor:  Moves all extremities weakly to command but spontaneously moves the right against gravity.  Does not move the left against gravity.   Sensory:  Appreciates light stimuli throughout  Plantars:  upgoing bilaterally  Cerebellar:  Unable to perform    Lab Results: Basic Metabolic Panel:  Recent Labs Lab 03/22/14 0850 03/24/14 0510 03/25/14 0540 03/26/14 0213 03/27/14 0242 03/28/14 0244   NA  --  140 140 144 142 143  K  --  3.4* 4.0 3.4* 4.1 4.3  CL  --  112 111 113* 112 112  CO2  --  16* 16* 15* 17* 18*  GLUCOSE  --  240* 212* 168* 158* 172*  BUN  --  17 21 22  26* 26*  CREATININE  --  0.82 0.92 0.80 0.93 0.89  CALCIUM  --  7.2* 7.7* 7.8* 8.6 8.5  MG 1.1*  --   --   --  1.4* 1.5  PHOS  --   --   --   --  2.3 3.4    Liver Function Tests: No results found for this basename: AST, ALT, ALKPHOS, BILITOT, PROT, ALBUMIN,  in the last 168 hours No results found for this basename: LIPASE, AMYLASE,  in the last 168 hours No results found for this basename: AMMONIA,  in the last 168 hours  CBC:  Recent Labs Lab 03/23/14 1200 03/24/14 0510 03/25/14 0540 03/26/14 0213 03/27/14 0242 03/28/14 0244  WBC 14.1* 10.3 8.4 10.1 13.3* 17.8*  NEUTROABS 10.9*  --   --   --   --   --   HGB 8.7* 8.0* 8.1* 8.1* 7.8* 9.3*  HCT 26.3* 25.1* 24.6* 24.9* 24.0* 28.4*  MCV 87.1 86.6 87.2 85.0 86.3 87.4  PLT 210 202 263 292 342 361    Cardiac Enzymes: No results found for this basename: CKTOTAL, CKMB, CKMBINDEX, TROPONINI,  in the last 168 hours  Lipid Panel:  Recent Labs Lab  03/23/14 0630  TRIG 111    CBG:  Recent Labs Lab 03/27/14 1551 03/27/14 2009 03/27/14 2334 03/28/14 0313 03/28/14 0749  GLUCAP 117* 155* 162* 163* 143*    Microbiology: Results for orders placed during the hospital encounter of 03/18/14  MRSA PCR SCREENING     Status: Abnormal   Collection Time    03/18/14  3:08 AM      Result Value Ref Range Status   MRSA by PCR POSITIVE (*) NEGATIVE Final   Comment:            The GeneXpert MRSA Assay (FDA     approved for NASAL specimens     only), is one component of a     comprehensive MRSA colonization     surveillance program. It is not     intended to diagnose MRSA     infection nor to guide or     monitor treatment for     MRSA infections.     RESULT CALLED TO, READ BACK BY AND VERIFIED WITH:     Karie FetchLAUREN HOUT,RN AT [id number] 03/18/14 BY K BARR  CSF  CULTURE     Status: None   Collection Time    03/20/14  5:21 PM      Result Value Ref Range Status   Specimen Description CSF   Final   Special Requests Normal   Final   Gram Stain     Final   Value: CYTOSPIN SLIDE WBC PRESENT,BOTH PMN AND MONONUCLEAR     NO ORGANISMS SEEN     Performed [facility]     Performed at [facility]   Culture     Final   Value: NO GROWTH 3 DAYS     Performed at [facility]   Report Status 03/24/2014 FINAL   Final  GRAM STAIN     Status: None   Collection Time    03/20/14  5:21 PM      Result Value Ref Range Status   Specimen Description CSF   Final   Special Requests NONE   Final   Gram Stain     Final   Value: CYTOSPIN SLIDE     WBC PRESENT,BOTH PMN AND MONONUCLEAR     NO ORGANISMS SEEN   Report Status 03/20/2014 FINAL   Final  CLOSTRIDIUM DIFFICILE BY PCR     Status: None   Collection Time    03/22/14  3:28 AM      Result Value Ref Range Status   C difficile by pcr NEGATIVE  NEGATIVE Final  CULTURE, RESPIRATORY (NON-EXPECTORATED)     Status: None   Collection Time    03/22/14 11:07 AM      Result Value Ref Range Status   Specimen Description TRACHEAL ASPIRATE   Final   Special Requests NONE   Final   Gram Stain     Final   Value: RARE WBC PRESENT, PREDOMINANTLY MONONUCLEAR     RARE SQUAMOUS EPITHELIAL CELLS PRESENT     NO ORGANISMS SEEN     Performed at [facility]   Culture     Final   Value: NO GROWTH 2 DAYS     Performed at [facility]   Report Status 03/24/2014 FINAL   Final  URINE CULTURE     Status: None   Collection Time    03/23/14  1:31 PM      Result Value Ref Range Status   Specimen Description URINE,  RANDOM   Final   Special Requests NONE   Final   Culture  Setup Time     Final   Value: 03/23/2014 17:22     Performed at [facility]   Colony Count     Final   Value: NO GROWTH     Performed at [facility]   Culture     Final   Value: NO GROWTH      Performed at [facility]   Report Status 03/24/2014 FINAL   Final  CULTURE, BLOOD (ROUTINE X 2)     Status: None   Collection Time    03/23/14  2:47 PM      Result Value Ref Range Status   Specimen Description BLOOD RIGHT HAND   Final   Special Requests BOTTLES DRAWN AEROBIC AND ANAEROBIC 10CC   Final   Culture  Setup Time     Final   Value: 03/23/2014 20:15     Performed at [facility]   Culture     Final   Value:        BLOOD CULTURE RECEIVED NO GROWTH TO DATE CULTURE WILL BE HELD FOR 5 DAYS BEFORE ISSUING A FINAL NEGATIVE REPORT     Performed at [facility]   Report Status PENDING   Incomplete  CULTURE, BLOOD (ROUTINE X 2)     Status: None   Collection Time    03/23/14  2:50 PM      Result Value Ref Range Status   Specimen Description BLOOD RIGHT WRIST   Final   Special Requests BOTTLES DRAWN AEROBIC ONLY 4CC   Final   Culture  Setup Time     Final   Value: 03/23/2014 20:14     Performed at [facility]   Culture     Final   Value:        BLOOD CULTURE RECEIVED NO GROWTH TO DATE CULTURE WILL BE HELD FOR 5 DAYS BEFORE ISSUING A FINAL NEGATIVE REPORT     Performed at [facility]   Report Status PENDING   Incomplete  CLOSTRIDIUM DIFFICILE BY PCR     Status: None   Collection Time    03/27/14  4:11 PM      Result Value Ref Range Status   C difficile by pcr NEGATIVE  NEGATIVE Final    Coagulation Studies: No results found for this basename: LABPROT, INR,  in the last 72 hours  Imaging: Dg Chest Port 1 View  03/28/2014   CLINICAL DATA:  ET tube evaluation.  Respiratory insufficiency  EXAM: PORTABLE CHEST - 1 VIEW  COMPARISON:  03/27/2014.  FINDINGS: Cardiomediastinal silhouette is increased. There is mild vascular congestion without areas of focal consolidation or overt failure. No effusion or pneumothorax.  ET tube 4.7 cm above carina.  Central venous catheter proximal SVC.  IMPRESSION: Stable chest.   Electronically Signed   By: Davonna Belling M.D.   On: 03/28/2014 07:44   Dg Chest Port 1 View  03/27/2014   CLINICAL DATA:  Hypoxia  EXAM: PORTABLE CHEST - 1 VIEW  COMPARISON:  March 26, 2014  FINDINGS: Endotracheal tube tip is 2.9 cm above the carina. Central catheter tip is in the superior vena cava. Nasogastric tube tip and side port are below the diaphragm. No pneumothorax. There is no edema or consolidation. Heart is upper normal in size with pulmonary vascularity within normal limits. No adenopathy.  IMPRESSION: Tube and catheter positions as described  without pneumothorax. No edema or consolidation.   Electronically Signed   By: Bretta BangWilliam  Woodruff M.D.   On: 03/27/2014 08:16   Dg Abd Portable 1v  03/26/2014   CLINICAL DATA:  Orogastric tube placement.  EXAM: PORTABLE ABDOMEN - 1 VIEW  COMPARISON:  None.  FINDINGS: Endotracheal tube noted with its tip 3 cm above the carina. Orogastric tube noted with its tip in the distal stomach. Right IJ line noted with its tip projected over superior vena cava. Cardiomegaly with normal pulmonary vascularity. Mild bilateral patchy pulmonary infiltrates cannot be excluded. No pleural effusion pneumothorax. No acute bony abnormality.  IMPRESSION: 1. Line and tubes in good anatomic position. 2. Patchy bilateral pulmonary infiltrates cannot be excluded. 3. Cardiomegaly with normal pulmonary vascularity.   Electronically Signed   By: Maisie Fushomas  Register   On: 03/26/2014 23:44    Medications:  I have reviewed the patient's current medications. Scheduled: . antiseptic oral rinse  7 mL Mouth Rinse QID  . artificial tears   Both Eyes 3 times per day  . aspirin  325 mg Per Tube Daily  . atorvastatin  80 mg Per Tube q1800  . chlorhexidine  15 mL Mouth Rinse BID  . feeding supplement (PRO-STAT SUGAR FREE 64)  30 mL Per Tube TID  . feeding supplement (VITAL HIGH PROTEIN)  1,000 mL Per Tube Q24H  . free water  200 mL Per Tube 3 times per day  . insulin aspart  0-15 Units Subcutaneous 6 times per day  .  insulin glargine  40 Units Subcutaneous Daily  . lacosamide (VIMPAT) IV  100 mg Intravenous Q12H  . levETIRAcetam  1,500 mg Intravenous Q12H  . multivitamin  5 mL Per Tube Daily  . pantoprazole sodium  40 mg Per Tube QHS  . phenytoin (DILANTIN) IV  100 mg Intravenous 3 times per day  . piperacillin-tazobactam (ZOSYN)  IV  3.375 g Intravenous Q8H  . topiramate  200 mg Per Tube BID    Assessment/Plan: No further seizure activity noted.  Mental status improving.  Remains on Dilantin, Topamax, Keppra and Vimpat.  Today's magnesium 1.5.  Unclear reason for focality on neurological examination.  MRI of the brain performed on 10/8 showed no acute changes.  ? Prolonged Todd's.    Recommendations: 1.  Continue AED's at current doses.   2.  Once extubated if she remains focal may need to repeat MRI.      LOS: 10 days   Thana FarrLeslie Reynolds, MD [facility] [phone] 03/28/2014  9:33 AM

## 2014-03-28 NOTE — Progress Notes (Signed)
PULMONARY / CRITICAL CARE MEDICINE   Name: Brooke DrossHarriett Gross MRN: [id number] DOB: 1940/10/17    ADMISSION DATE:  03/18/2014  [facility]  CHIEF COMPLAINT:  Seizure  INITIAL PRESENTATION:  73 [facility] and NSTEMI.  Intubated for airway protection.  PCCM asked to admit to ICU.  SIGNIFICANT EVENTS/STUDIES:  10/02 [facility] after arrival. Dopamine initiated for hypotension. Vanc, acyclovir, ceftriaxone and ampicillin initiated empirically 10/02 CT head [facility]([place]): no acute abn 10/02 Neuro consult: Etiology of seizure activity is unclear. Acute ischemic cerebral infarction cannot be ruled out.  Recommendations: Continue Keppra IV at 1000 mg every 12 hours. Continue Vimpat at 100 mg every 12 hours. Propofol IV for sedation as well as as seizure control. EEG to rule out continued subclinical seizure activity. MRI of the brain without contrast to rule out acute cerebral infarction. 10/02 Cards consult: Her biomarkers suggest a fairly large MI and ECG has many high risk features. 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 ASA, statin, heparin initiated. Beta blocker not option due to hypotension.  10/02 TTE:  LVEF is approximately 50 to 55% with inferior/inferoseptal hypokinesis, grade 1 diastolic dysfx 10/03 No WUA. Remained on dopamine 10/03 Persistent seizure-like activity despite mutliple anti-consvulsants. Burst suppression initiated. LP recommended to R/O infectious etiology 10/03 cont EEG: Burst suppression, asymmetric, with greater suppression on the right. Semi-periodic epileptiform discharges, left hemisphere, maximal over the mid/posterior temporal region. The findings indicate a severe diffuse encephalopathy with epileptogenic potential noted over the left hemisphere, particularly over the left posterior temporal-occipital region. No ongoing seizure activity was observed that would explain an altered  mental status 10/04 Remained in medically induced coma and on dopamine.  10/04 LP performed by Neuro. CSF: glu 123, protein 30, RBC 3, WBC 8, 52% lymphs 10/05 Unable to tolerated decrease in propofol due to tachypnea. Ketamine initiated. Propofol weaned to off. DA transitioned to NE. Anti-bacterial abx discontinued. Acyclovir continued 10/05 CT head: no acute abn 10/06 Low grade fever. Resp cx and C diff PCR ordered. Ketamine stopped. Midaz gtt @ 10 mg/hr. No overt seizures. Continuous EEG 10/08 No further seizure. Remained encephalopathic. Tachypneic on WUA/SBT. Nearly off vasopressors. Remained severely volume overloaded. Lasix ordered 10/09 Continuous EEG discontinued. Tachypneic and excessive coughing when dexmedetomidine gtt discontinued. Son updated in detail @ bedside. He indicated that he would not want to pursue trach tube and long term care if neuro prognosis for functional recovery is not good 10/09 MRI brain >> negative 10/10 opening eyes to command  INDWELLING DEVICES:: ETT 10/02 >>  R IJ CVL 10/02 >>10/11  MICRO DATA: PCT 10/07: 0.42,  10/08: 0.32.  10/09: 0.37 MRSA PCR 10/02 >> POS CSF 10/04 >> NEG HSV PCR (CSF) 10/04 >> NEG C diff PCR 10/06 >> NEG Resp 10/06 >> NEG Urine 10/07 >> NEG Blood 10/07 >>   ANTIMICROBIALS:  Vanc 10/02 >> 10/05 Ceftriaxone 10/02 >> 10/05 Ampicillin 10/02 >> 10/05 Acyclovir 10/02 >> 10/08 Vancomycin 10/7 >> 10/09 Zosyn 10/7 >>   SUBJECTIVE:  RASS -3. Not F/C. No overt seizures  VITAL SIGNS: Temp:  [98.7 F (37.1 C)-99.3 F (37.4 C)] 99.1 F (37.3 C) (10/12 0751) Pulse Rate:  [65-83] 74 (10/12 0900) Resp:  [14-36] 19 (10/12 0900) BP: (92-152)/(38-82) 123/43 mmHg (10/12 0900) SpO2:  [94 %-100 %] 100 % (10/12 0900) FiO2 (%):  [30 %] 30 % (10/12 0900) Weight:  [95.7 kg (210 lb 15.7 oz)] 95.7 kg (210 lb 15.7  oz) (10/12 0317)  VENTILATOR SETTINGS: Vent Mode:  [-] PRVC FiO2 (%):  [30 %] 30 % Set Rate:  [14 bmp] 14 bmp Vt Set:  [550  mL] 550 mL PEEP:  [5 cmH20] 5 cmH20 Pressure Support:  [10 cmH20] 10 cmH20 Plateau Pressure:  [18 cmH20-21 cmH20] 19 cmH20  INTAKE / OUTPUT:  Intake/Output Summary (Last 24 hours) at 03/28/14 0936 Last data filed at 03/28/14 0900  Gross per 24 hour  Intake   1375 ml  Output   1460 ml  Net    -85 ml   PHYSICAL EXAMINATION: General: Synchronous with the vent, opens eyes to command and moves ext to commands Neuro: RASS -2, MAEs HEENT: NCAT, PERRL Cardiovascular:  Regular, no murmur Lungs:  Scattered rhonchi Abdomen:  Soft, non tender, +BS Ext: Warm, no edema  LABS: I have reviewed all of today's lab results. 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 Hypotension, improving P:  Cards following from distance - call back for further eval if neuro recovery is good D/C pressors  RENAL A:   CKD, Cr appears to be @ baseline Hypokalemia, resolved Hypervolemia P:   Monitor BMET intermittently. Monitor I/Os. Correct electrolytes as indicated. Gentle diureses, lasix 20 mg IV q6 x3 doses. D/C scheduled potassium. D/C free water  GASTROINTESTINAL A:   No issues P:   SUP: enteral PPI Hold TF for potential extubation today, if fails then will restart.  HEMATOLOGIC A:   Mild ICU associated anemia P:  DVT px: SQ heparin Monitor CBC intermittently Transfuse per usual ICU guidelines noting recent NSTEMI  INFECTIOUS A: Fever - trending down Leukocytosis - resolved Suspected LLL PNA P:   Micro and abx as above DC vanc 10/09 Complete 7 days pip-tazoj, stop date in place  ENDOCRINE A:   DM type II, marginal  control  P:   Cont SSI Cont Lantus  NEUROLOGIC A:   Status epilepticus,controlled Hx of Bipolar, essential tremor, anxiety, Migraine, Vertigo. P:   RASS goal: -1 Cont Precedex and PRN fent AED's per neurology Daily WUA  Family updated: No family bedside today  Interdisciplinary Family Meeting v Palliative Care Meeting: N/A  CC time : 35 mins  Alyson ReedyWesam G. Yacoub, M.D. [facility] Pulmonary/Critical Care Medicine. Pager: [phone]. After hours pager: [phone].

## 2014-03-28 NOTE — Progress Notes (Addendum)
Patient Profile: 73 yo female w/ bipolar DO, DM, CKD, HLD, obesity, was admitted 10/02 early am w/ status epilepticus and NSTEMI. Troponin sustained > 20, EF 50-55% with inf hypokinesis on echo.   Subjective: Opens eyes to verbal, moves extremities, ?took a deep breath in response to request. No directed movements of extremities.  Objective: Vital signs in last 24 hours: Temp:  [98.7 F (37.1 C)-99.3 F (37.4 C)] 99 F (37.2 C) (10/12 1132) Pulse Rate:  [65-83] 81 (10/12 1100) Resp:  [14-36] 23 (10/12 1100) BP: (92-159)/(38-103) 159/103 mmHg (10/12 1100) SpO2:  [94 %-100 %] 100 % (10/12 1100) FiO2 (%):  [30 %] 30 % (10/12 1100) Weight:  [210 lb 15.7 oz (95.7 kg)] 210 lb 15.7 oz (95.7 kg) (10/12 0317) Weight change: 7.1 oz (0.2 kg) Last BM Date: 03/28/14 Intake/Output from previous day: 10/11 0701 - 10/12 0700 In: 1450.6 [I.V.:225.6; NG/GT:805; IV Piggyback:420] Out: 1460 [Urine:1310; Stool:150] Intake/Output this shift: Total I/O In: 625 [I.V.:40; NG/GT:150; IV Piggyback:435] Out: 225 [Urine:225]  PE:  General appearance: intubated and opens eyes to verbal  Lungs: few rales bases Heart: regular rate and rhythm  Extremities: no edema  Pulses: 2+ and symmetric  Skin: warm and dry   Lab Results:  Recent Labs  03/27/14 0242 03/28/14 0244  WBC 13.3* 17.8*  HGB 7.8* 9.3*  HCT 24.0* 28.4*  PLT 342 361   BMET  Recent Labs  03/27/14 0242 03/28/14 0244  NA 142 143  K 4.1 4.3  CL 112 112  CO2 17* 18*  GLUCOSE 158* 172*  BUN 26* 26*  CREATININE 0.93 0.89  CALCIUM 8.6 8.5   Lab Results  Component Value Date   TRIG 111 03/23/2014     Lab Results  Component Value Date   TSH 2.090 12/15/2013   Studies/Results: Dg Chest Port 1 View 03/28/2014   CLINICAL DATA:  ET tube evaluation.  Respiratory insufficiency  EXAM: PORTABLE CHEST - 1 VIEW  COMPARISON:  03/27/2014.  FINDINGS: Cardiomediastinal silhouette is increased. There is mild vascular congestion  without areas of focal consolidation or overt failure. No effusion or pneumothorax.  ET tube 4.7 cm above carina.  Central venous catheter proximal SVC.  IMPRESSION: Stable chest.   Electronically Signed   By: Davonna BellingJohn  Curnes M.D.   On: 03/28/2014 07:44   Dg Chest Port 1 View 03/27/2014   CLINICAL DATA:  Hypoxia  EXAM: PORTABLE CHEST - 1 VIEW  COMPARISON:  March 26, 2014  FINDINGS: Endotracheal tube tip is 2.9 cm above the carina. Central catheter tip is in the superior vena cava. Nasogastric tube tip and side port are below the diaphragm. No pneumothorax. There is no edema or consolidation. Heart is upper normal in size with pulmonary vascularity within normal limits. No adenopathy.  IMPRESSION: Tube and catheter positions as described without pneumothorax. No edema or consolidation.   Electronically Signed   By: Bretta BangWilliam  Woodruff M.D.   On: 03/27/2014 08:16   Dg Abd Portable 1v 03/26/2014   CLINICAL DATA:  Orogastric tube placement.  EXAM: PORTABLE ABDOMEN - 1 VIEW  COMPARISON:  None.  FINDINGS: Endotracheal tube noted with its tip 3 cm above the carina. Orogastric tube noted with its tip in the distal stomach. Right IJ line noted with its tip projected over superior vena cava. Cardiomegaly with normal pulmonary vascularity. Mild bilateral patchy pulmonary infiltrates cannot be excluded. No pleural effusion pneumothorax. No acute bony abnormality.  IMPRESSION: 1. Line and tubes in good  anatomic position. 2. Patchy bilateral pulmonary infiltrates cannot be excluded. 3. Cardiomegaly with normal pulmonary vascularity.   Electronically Signed   By: Maisie Fushomas  Register   On: 03/26/2014 23:44   2D Echo: Left ventricle: The cavity size was normal. Wall thickness was normal. Doppler parameters are consistent with abnormal left ventricular relaxation (grade 1 diastolic dysfunction).  LVEF is approximately 50 to 55% with inferior/inferoseptal hypokinesis  Medications: I have reviewed the patient's current  medications. Scheduled Meds: . antiseptic oral rinse  7 mL Mouth Rinse QID  . artificial tears   Both Eyes 3 times per day  . aspirin  325 mg Per Tube Daily  . atorvastatin  80 mg Per Tube q1800  . chlorhexidine  15 mL Mouth Rinse BID  . feeding supplement (PRO-STAT SUGAR FREE 64)  30 mL Per Tube TID  . feeding supplement (VITAL HIGH PROTEIN)  1,000 mL Per Tube Q24H  . furosemide  20 mg Intravenous Q6H  . heparin subcutaneous  5,000 Units Subcutaneous 3 times per day  . insulin aspart  0-15 Units Subcutaneous 6 times per day  . insulin glargine  40 Units Subcutaneous Daily  . lacosamide (VIMPAT) IV  100 mg Intravenous Q12H  . levETIRAcetam  1,500 mg Intravenous Q12H  . multivitamin  5 mL Per Tube Daily  . pantoprazole sodium  40 mg Per Tube QHS  . phenytoin (DILANTIN) IV  100 mg Intravenous 3 times per day  . piperacillin-tazobactam (ZOSYN)  IV  3.375 g Intravenous Q8H  . topiramate  200 mg Per Tube BID   Continuous Infusions: . sodium chloride    . dexmedetomidine Stopped (03/27/14 0805)   PRN Meds:.sodium chloride, acetaminophen (TYLENOL) oral liquid 160 mg/5 mL, fentaNYL, sodium chloride  Assessment/Plan: 73 [facility] and NSTEM[facility]. Intubated for airway protection, + NSTEMI with troponin > 20.00,   1. Status epilepticus -no further seizure activity, improving mental status, trying to wean vent; if unable, plan for trach   2. NSTEMI  - medical rx with ASA, statin. No BB with SBP 90s. BP is improving, may be able to start low-dose BB soon, but right now, needs Lasix more.  3. Shock with continued required for low-dose levophed - Levophed d/c'd 10/10. MAP has been low due to low DBP, but SBP generally > 90.  4. Acute Diastolic heart failure (volume overload) -improved - lasix 20 mg IV q6 hr started 10/12. At one point, I/O positive > 12 Liters, Currently 7 L positive.   5. LLL AS dz, PNA suspected, on antibiotics per CCM  6. Acute  respiratory failure- hope to extubate today  Management as per CCM and neuro teams. Pt is being diuresed. Await neuro recovery. Once she recovers, if she has ischemic symptoms, would pursue ischemic eval. Right now, continue medical therapy. She is on appropriate post-MI Rx with ASA and a statin drug. No beta-blocker/ACE secondary to hypotension.    LOS: 10 days   Time spent with pt. :15 minutes. Theodore DemarkRhonda Barrett, PA-C [phone]  03/28/2014, 11:51 AM   Personally seen and examined. Agree with above. Discussion as above.  Improved mentation. Donato SchultzSKAINS, MARK, MD

## 2014-03-29 ENCOUNTER — Inpatient Hospital Stay (HOSPITAL_COMMUNITY): Payer: Medicare Other

## 2014-03-29 DIAGNOSIS — J9601 Acute respiratory failure with hypoxia: Secondary | ICD-10-CM

## 2014-03-29 LAB — CBC
HCT: 25.4 % — ABNORMAL LOW (ref 36.0–46.0)
Hemoglobin: 8 g/dL — ABNORMAL LOW (ref 12.0–15.0)
MCH: 27.5 pg (ref 26.0–34.0)
MCHC: 31.5 g/dL (ref 30.0–36.0)
MCV: 87.3 fL (ref 78.0–100.0)
Platelets: 496 10*3/uL — ABNORMAL HIGH (ref 150–400)
RBC: 2.91 MIL/uL — ABNORMAL LOW (ref 3.87–5.11)
RDW: 15.8 % — ABNORMAL HIGH (ref 11.5–15.5)
WBC: 10.3 10*3/uL (ref 4.0–10.5)

## 2014-03-29 LAB — BASIC METABOLIC PANEL
Anion gap: 13 (ref 5–15)
BUN: 27 mg/dL — ABNORMAL HIGH (ref 6–23)
CO2: 22 mEq/L (ref 19–32)
Calcium: 8.6 mg/dL (ref 8.4–10.5)
Chloride: 110 mEq/L (ref 96–112)
Creatinine, Ser: 0.91 mg/dL (ref 0.50–1.10)
GFR calc Af Amer: 72 mL/min — ABNORMAL LOW (ref >90)
GFR calc non Af Amer: 62 mL/min — ABNORMAL LOW (ref >90)
Glucose, Bld: 158 mg/dL — ABNORMAL HIGH (ref 70–99)
Potassium: 3.1 mEq/L — ABNORMAL LOW (ref 3.7–5.3)
Sodium: 145 mEq/L (ref 137–147)

## 2014-03-29 LAB — GLUCOSE, CAPILLARY
Glucose-Capillary: 133 mg/dL — ABNORMAL HIGH (ref 70–99)
Glucose-Capillary: 140 mg/dL — ABNORMAL HIGH (ref 70–99)
Glucose-Capillary: 151 mg/dL — ABNORMAL HIGH (ref 70–99)
Glucose-Capillary: 158 mg/dL — ABNORMAL HIGH (ref 70–99)
Glucose-Capillary: 185 mg/dL — ABNORMAL HIGH (ref 70–99)
Glucose-Capillary: 80 mg/dL (ref 70–99)
Glucose-Capillary: 88 mg/dL (ref 70–99)

## 2014-03-29 LAB — CULTURE, BLOOD (ROUTINE X 2)
Culture: NO GROWTH
Culture: NO GROWTH

## 2014-03-29 LAB — BLOOD GAS, ARTERIAL
Acid-base deficit: 2.6 mmol/L — ABNORMAL HIGH (ref 0.0–2.0)
Bicarbonate: 21.3 mEq/L (ref 20.0–24.0)
Drawn by: 36277
FIO2: 30 %
MECHVT: 550 mL
O2 Saturation: 98.9 %
PEEP: 5 cmH2O
Patient temperature: 98.6
RATE: 14 resp/min
TCO2: 22.4 mmol/L (ref 0–100)
pCO2 arterial: 34.9 mmHg — ABNORMAL LOW (ref 35.0–45.0)
pH, Arterial: 7.403 (ref 7.350–7.450)
pO2, Arterial: 124 mmHg — ABNORMAL HIGH (ref 80.0–100.0)

## 2014-03-29 LAB — MAGNESIUM: Magnesium: 1.5 mg/dL (ref 1.5–2.5)

## 2014-03-29 LAB — PHOSPHORUS: Phosphorus: 3.2 mg/dL (ref 2.3–4.6)

## 2014-03-29 MED ORDER — MAGNESIUM SULFATE 40 MG/ML IJ SOLN
2.0000 g | Freq: Once | INTRAMUSCULAR | Status: AC
  Administered 2014-03-29: 2 g via INTRAVENOUS
  Filled 2014-03-29: qty 50

## 2014-03-29 MED ORDER — CLOPIDOGREL BISULFATE 75 MG PO TABS
75.0000 mg | ORAL_TABLET | Freq: Every day | ORAL | Status: DC
  Administered 2014-03-29 – 2014-04-07 (×9): 75 mg via ORAL
  Filled 2014-03-29 (×10): qty 1

## 2014-03-29 MED ORDER — FUROSEMIDE 10 MG/ML IJ SOLN
40.0000 mg | Freq: Four times a day (QID) | INTRAMUSCULAR | Status: AC
  Administered 2014-03-29 – 2014-03-30 (×3): 40 mg via INTRAVENOUS
  Filled 2014-03-29 (×3): qty 4

## 2014-03-29 MED ORDER — POTASSIUM CHLORIDE 10 MEQ/100ML IV SOLN
10.0000 meq | INTRAVENOUS | Status: AC
  Administered 2014-03-29 (×4): 10 meq via INTRAVENOUS
  Filled 2014-03-29 (×2): qty 100

## 2014-03-29 MED ORDER — MAGNESIUM SULFATE 50 % IJ SOLN
6.0000 g | Freq: Once | INTRAMUSCULAR | Status: AC
  Administered 2014-03-29: 6 g via INTRAVENOUS
  Filled 2014-03-29: qty 12

## 2014-03-29 MED ORDER — POTASSIUM CHLORIDE 10 MEQ/50ML IV SOLN: 10.0000 meq | INTRAVENOUS | Status: DC

## 2014-03-29 MED ORDER — ASPIRIN 81 MG PO CHEW
81.0000 mg | CHEWABLE_TABLET | Freq: Every day | ORAL | Status: DC
  Administered 2014-03-31 – 2014-04-07 (×8): 81 mg via ORAL
  Filled 2014-03-29 (×8): qty 1

## 2014-03-29 MED ORDER — POTASSIUM CHLORIDE 20 MEQ/15ML (10%) PO LIQD
30.0000 meq | ORAL | Status: AC
  Administered 2014-03-29: 05:00:00
  Filled 2014-03-29: qty 30

## 2014-03-29 NOTE — Progress Notes (Signed)
UR completed.  Await medical stability to make firm plans for pt's discharge. Pt with [employer] coverage so will need to be at least hospital day 21(day 11), trach day 7 (no trach currently) and 3 documented failed wean attempts to qualify for LTACH.   Carlyle LipaMichelle Bryson, RN BSN MHA CCM Trauma/Neuro ICU Case Manager [phone]

## 2014-03-29 NOTE — Progress Notes (Signed)
[facility] REPLACEMENT [facility]OTOCOL FOR AM LAB REPLACEMENT ONLY  The patient does apply for the [facility] Electrolyte Replacment Protocol based on the criteria listed below:   1. Is GFR >/= 40 ml/min? Yes.    Patient's GFR today is 62 2. Is urine output >/= 0.5 ml/kg/hr for the last 6 hours? Yes.   Patient's UOP is 0.60 ml/kg/hr 3. Is BUN < 60 mg/dL? Yes.    Patient's BUN today is 27 4. Abnormal electrolyte K 3.1, Mg 1.5 5. Ordered repletion with: per protocol 6. If a panic level lab has been reported, has the CCM MD in charge been notified? Yes.  .   Physician:  Gillis Santalva  Suits, Elizabeth McEachran 03/29/2014 4:29 AM

## 2014-03-29 NOTE — Progress Notes (Signed)
Subjective: Continues to improve neurologically  Exam: Filed Vitals:   03/29/14 0700  BP: 145/57  Pulse: 80  Temp:   Resp: 21   Gen: in bed, intubated MS: awake, alert, engages examiner. follwos commands briskly.  CN: Endorses vision in both hemifields,  Motor: moves all extremities to command, ? Left arm weakness, thoguh mild if present.  Sensory: endorses sensation bilaterally.   Impression: 73 yo F who presented with refractory status epilepticus. She had clinical correlate fo right arm twitching with left posterior recurrent runs of sharply contoured activity. The focal nature argues against a generalized metabolic etiology such as EtOH withdrawal or electrolyte abnormalities. She was in partial complex status at initiation of EEG and then was kept in burst suppression for 48 hours prior to resolution of status epilepticus.   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 With NORSE, approximately half remain cryptogenic while a significant percentage of the remaining are either infectious or autoimmune in nature. Her CSF was not supportive of an infectious nature. With her improvement, I would favor further evaluation after extubation.    Recommendations: 1) Continue dilantin, keppra, vimpat, topamax for now.  2) Send autoantibodies(ro, hu, nmda, vgkc, vgcc, ana, crmp5, gad)  Ritta SlotMcNeill Kirkpatrick, MD [facility] [phone]  If 7pm- 7am, please page neurology on call as listed in AMION. 03/29/2014  7:28 AM

## 2014-03-29 NOTE — Progress Notes (Signed)
03/29/2014-Resp care note- Pt suctioned and extubated to 4lpm cannula at 1115 as per MD order.  Pt tolerated extubation well with pt sats 98% on 4lpm cannula.  RR 20 bpm and pt with a good productive cough.  Will wean oxygen as tolerated.  Vent and ambu bag on stand-by at bedside.  RN at bedside for procedure.  s brewer rrt, rcp

## 2014-03-29 NOTE — Progress Notes (Signed)
PULMONARY / CRITICAL CARE MEDICINE   Name: Brooke DrossHarriett Whan MRN: [id number] DOB: [birth year]    ADMISSION DATE:  03/18/2014  [facility]  CHIEF COMPLAINT:  Seizure  INITIAL PRESENTATION:  73 [facility] and NSTEMI.  Intubated for airway protection.  PCCM asked to admit to ICU.  SIGNIFICANT EVENTS/STUDIES:  10/02 Transfer to [facility], intubated shortly after arrival. Dopamine initiated for hypotension. Vanc, acyclovir, ceftriaxone and ampicillin initiated empirically 10/02 CT head [facility] no acute abn 10/02 Neuro consult: Etiology of seizure activity is unclear. Acute ischemic cerebral infarction cannot be ruled out.  Recommendations: Continue Keppra IV at 1000 mg every 12 hours. Continue Vimpat at 100 mg every 12 hours. Propofol IV for sedation as well as as seizure control. EEG to rule out continued subclinical seizure activity. MRI of the brain without contrast to rule out acute cerebral infarction. 10/02 Cards consult: Her biomarkers suggest a fairly large MI and ECG has many high risk features. 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 ASA, statin, heparin initiated. Beta blocker not option due to hypotension.  10/02 TTE:  LVEF is approximately 50 to 55% with inferior/inferoseptal hypokinesis, grade 1 diastolic dysfx 10/03 No WUA. Remained on dopamine 10/03 Persistent seizure-like activity despite mutliple anti-consvulsants. Burst suppression initiated. LP recommended to R/O infectious etiology 10/03 cont EEG: Burst suppression, asymmetric, with greater suppression on the right. Semi-periodic epileptiform discharges, left hemisphere, maximal over the mid/posterior temporal region. The findings indicate a severe diffuse encephalopathy with epileptogenic potential noted over the left hemisphere, particularly over the left posterior temporal-occipital region. No ongoing seizure activity was observed that would explain an altered  mental status 10/04 Remained in medically induced coma and on dopamine.  10/04 LP performed by Neuro. CSF: glu 123, protein 30, RBC 3, WBC 8, 52% lymphs 10/05 Unable to tolerated decrease in propofol due to tachypnea. Ketamine initiated. Propofol weaned to off. DA transitioned to NE. Anti-bacterial abx discontinued. Acyclovir continued 10/05 CT head: no acute abn 10/06 Low grade fever. Resp cx and C diff PCR ordered. Ketamine stopped. Midaz gtt @ 10 mg/hr. No overt seizures. Continuous EEG 10/08 No further seizure. Remained encephalopathic. Tachypneic on WUA/SBT. Nearly off vasopressors. Remained severely volume overloaded. Lasix ordered 10/09 Continuous EEG discontinued. Tachypneic and excessive coughing when dexmedetomidine gtt discontinued. Son updated in detail @ bedside. He indicated that he would not want to pursue trach tube and long term care if neuro prognosis for functional recovery is not good 10/09 MRI brain >> negative 10/10 opening eyes to command  INDWELLING DEVICES:: ETT 10/02 >> 10/13 R IJ CVL 10/02 >>10/11  MICRO DATA: PCT 10/07: 0.42,  10/08: 0.32.  10/09: 0.37 MRSA PCR 10/02 >> POS CSF 10/04 >> NEG HSV PCR (CSF) 10/04 >> NEG C diff PCR 10/06 >> NEG Resp 10/06 >> NEG Urine 10/07 >> NEG Blood 10/07 >> NTD  ANTIMICROBIALS:  Vanc 10/02 >> 10/05 Ceftriaxone 10/02 >> 10/05 Ampicillin 10/02 >> 10/05 Acyclovir 10/02 >> 10/08 Vancomycin 10/7 >> 10/09 Zosyn 10/7 >>   SUBJECTIVE:  RASS -3. Not F/C. No overt seizures  VITAL SIGNS: Temp:  [97.9 F (36.6 C)-99.1 F (37.3 C)] 99 F (37.2 C) (10/13 1147) Pulse Rate:  [61-80] 78 (10/13 1100) Resp:  [14-29] 23 (10/13 1100) BP: (103-145)/(31-96) 121/56 mmHg (10/13 1100) SpO2:  [96 %-100 %] 100 % (10/13 1100) FiO2 (%):  [30 %] 30 % (10/13 1100) Weight:  [94.5 kg (208 lb 5.4 oz)] 94.5 kg (208 lb 5.4  oz) (10/13 0500)  VENTILATOR SETTINGS: Vent Mode:  [-] CPAP;PSV FiO2 (%):  [30 %] 30 % Set Rate:  [14 bmp] 14 bmp Vt  Set:  [550 mL] 550 mL PEEP:  [5 cmH20] 5 cmH20 Pressure Support:  [5 cmH20-8 cmH20] 8 cmH20 Plateau Pressure:  [19 cmH20-21 cmH20] 21 cmH20  INTAKE / OUTPUT:  Intake/Output Summary (Last 24 hours) at 03/29/14 1220 Last data filed at 03/29/14 1111  Gross per 24 hour  Intake   1490 ml  Output   1800 ml  Net   -310 ml   PHYSICAL EXAMINATION: General: Synchronous with the vent, opens eyes to command and moves ext to commands Neuro: RASS 0, MAEs HEENT: NCAT, PERRL Cardiovascular:  Regular, no murmur Lungs:  Scattered rhonchi Abdomen:  Soft, non tender, +BS Ext: Warm, no edema  LABS: I have reviewed all of today's lab results. Relevant abnormalities are discussed in the A/P section  CXR: NSC hazy LLL opacity  ASSESSMENT / PLAN:  PULMONARY A: Acute respiratory failure Pulm edema, improved LLL AS dz - suspect PNA P:   Extubate today, if unable to protect airway then will reintubate and proceed with trach later on this week. Cont vent bundle Diureses as below Titrate O2 for sats. Mobilize.  CARDIOVASCULAR A:  NSTEMI  Hx of HTN, HLD. Hypotension, improving P:  Cards following from distance - will call back once more stable from a neuro and respiratory standpoint D/C pressors  RENAL A:   CKD, Cr appears to be @ baseline Hypokalemia, resolved Hypervolemia P:   Monitor BMET intermittently. Monitor I/Os. Correct electrolytes as indicated. Lasix 40 mg IV q6 x3. Replace K. D/C free water  GASTROINTESTINAL A:   No issues P:   SUP: enteral PPI D/C TF for extubation. Swallow evaluation.  HEMATOLOGIC A:   Mild ICU associated anemia P:  DVT px: SQ heparin Monitor CBC intermittently Transfuse per usual ICU guidelines noting recent NSTEMI  INFECTIOUS A: Fever - trending down Leukocytosis - resolved Suspected LLL PNA P:   Micro and abx as above DC vanc 10/09 Complete 7 days pip-tazoj, stop date in place  ENDOCRINE A:   DM type II, marginal control   P:   Cont SSI Cont Lantus  NEUROLOGIC A:   Status epilepticus,controlled Hx of Bipolar, essential tremor, anxiety, Migraine, Vertigo. P:   D/C sedation for extubation. AED's per neurology Daily WUA  Family updated: No family bedside today  Interdisciplinary Family Meeting v Palliative Care Meeting: N/A  CC time : 35 mins  Alyson ReedyWesam G. Yacoub, M.D. [facility] Pulmonary/Critical Care Medicine. Pager: [phone]. After hours pager: [phone].

## 2014-03-29 NOTE — Progress Notes (Addendum)
Patient Profile: 73 yo female w/ bipolar DO, DM, CKD, HLD, obesity, was admitted 10/02 early am w/ status epilepticus and NSTEMI. Troponin > 20, EF 50-55% with inf hypokinesis on echo.   Subjective: Opens eyes to verbal, moves extremities,  Objective: Vital signs in last 24 hours: Temp:  [97.9 F (36.6 C)-99.1 F (37.3 C)] 98 F (36.7 C) (10/13 0741) Pulse Rate:  [61-81] 64 (10/13 0800) Resp:  [14-29] 14 (10/13 0800) BP: (103-159)/(31-103) 133/96 mmHg (10/13 0800) SpO2:  [96 %-100 %] 99 % (10/13 0800) FiO2 (%):  [30 %] 30 % (10/13 0600) Weight:  [208 lb 5.4 oz (94.5 kg)] 208 lb 5.4 oz (94.5 kg) (10/13 0500) Weight change: -2 lb 10.3 oz (-1.2 kg) Last BM Date: 03/28/14 Intake/Output from previous day: 10/12 0701 - 10/13 0700 In: 1827.5 [I.V.:250; NG/GT:930; IV Piggyback:647.5] Out: 2175 [Urine:1875; Stool:300] Intake/Output this shift:    PE:  General appearance: intubated and opens eyes to verbal  Lungs: few rales bases Heart: regular rate and rhythm  Extremities: no edema  Pulses: 2+ and symmetric  Skin: warm and dry   Lab Results:  Recent Labs  03/28/14 0244 03/29/14 0310  WBC 17.8* 10.3  HGB 9.3* 8.0*  HCT 28.4* 25.4*  PLT 361 496*   BMET  Recent Labs  03/28/14 0244 03/29/14 0310  NA 143 145  K 4.3 3.1*  CL 112 110  CO2 18* 22  GLUCOSE 172* 158*  BUN 26* 27*  CREATININE 0.89 0.91  CALCIUM 8.5 8.6   Lab Results  Component Value Date   TRIG 111 03/23/2014     Lab Results  Component Value Date   TSH 2.090 12/15/2013   Studies/Results: Dg Chest Port 1 View 03/28/2014    IMPRESSION: Stable chest.   Electronically 2D Echo: Left ventricle: The cavity size was normal. Wall thickness was normal. Doppler parameters are consistent with abnormal left ventricular relaxation (grade 1 diastolic dysfunction).  LVEF is approximately 50 to 55% with inferior/inferoseptal hypokinesis  Medications: I have reviewed the patient's current  medications. Scheduled Meds: . antiseptic oral rinse  7 mL Mouth Rinse QID  . artificial tears   Both Eyes 3 times per day  . aspirin  325 mg Per Tube Daily  . atorvastatin  80 mg Per Tube q1800  . chlorhexidine  15 mL Mouth Rinse BID  . feeding supplement (PRO-STAT SUGAR FREE 64)  30 mL Per Tube TID  . feeding supplement (VITAL HIGH PROTEIN)  1,000 mL Per Tube Q24H  . heparin subcutaneous  5,000 Units Subcutaneous 3 times per day  . insulin aspart  0-15 Units Subcutaneous 6 times per day  . insulin glargine  40 Units Subcutaneous Daily  . lacosamide (VIMPAT) IV  100 mg Intravenous Q12H  . levETIRAcetam  1,500 mg Intravenous Q12H  . magnesium sulfate LVP 250-500 ml  6 g Intravenous Once  . multivitamin  5 mL Per Tube Daily  . pantoprazole sodium  40 mg Per Tube QHS  . phenytoin (DILANTIN) IV  100 mg Intravenous 3 times per day  . piperacillin-tazobactam (ZOSYN)  IV  3.375 g Intravenous Q8H  . potassium chloride  30 mEq Per Tube Q4H  . topiramate  200 mg Per Tube BID   Continuous Infusions: . sodium chloride    . dexmedetomidine Stopped (03/27/14 0805)   PRN Meds:.sodium chloride, acetaminophen (TYLENOL) oral liquid 160 mg/5 mL, fentaNYL, sodium chloride  Assessment/Plan: 73 [facility] [place] [facility] [place] [facility]  and NSTEMI. Intubated for airway protection, + NSTEMI with troponin > 20.00   1. Status epilepticus -no further seizure activity, improving mental status, trying to wean vent; if unable, plan for trach . No underlying cause found.   2. NSTEMI  - medical rx with ASA (changed to 81mg ), statin. Will add clopidogrel to optimize medical mgt.  No BB with SBP 90s. BP is improving, may be able to start low-dose BB soon  3. Shock - improved, off Levophed  4. Acute Diastolic heart failure (volume overload) -improved - was given lasix previously.  At one point, I/O positive > 12 Liters, Currently 6.4 L positive. Normal EF. Not opposed to more lasix however BP  has been previously an issue. CCM has been following. Mild edema on CXR.   5. LLL PNA suspected, on antibiotics per CCM  6. Acute respiratory failure-CCM  7. Anemia - Hgb 8 - if <8 consider PRBC.   Management as per CCM and neuro teams. Pt is being diuresed. Await neuro recovery. Once she recovers, if she has ischemic symptoms, would pursue ischemic eval. Right now, continue medical therapy. She is on appropriate post-MI Rx with ASA, clopidogrel and a statin drug. No beta-blocker/ACE secondary to hypotension.    LOS: 11 days  Donato SchultzSKAINS, MARK, MD 03/29/2014, 9:07 AM

## 2014-03-29 NOTE — Progress Notes (Signed)
Spoke with patient's son Brooke Gross (also the DPOA and an attorney), after discussion, decision was made that patient would not want a tracheostomy.  After discussion, will make patient a LCB with no CPR/cardioversion/intubation but continue treatment as able.  Alyson ReedyWesam G. Yacoub, M.D. [facility] Pulmonary/Critical Care Medicine. Pager: [phone]. After hours pager: [phone].

## 2014-03-29 NOTE — Progress Notes (Signed)
NUTRITION FOLLOW-UP  DOCUMENTATION CODES Per approved criteria  -Obesity Unspecified   INTERVENTION:  Vital High Protein @ 35 ml/hr via OG tube.   Increase Prostat: 30 ml Prostat TID.   Continue MVI daily.   Tube feeding regimen provides 1140 kcal (25 kcal/kg IBW), 118 grams of protein, and 702 ml of H2O.   NUTRITION DIAGNOSIS: Inadequate oral intake related to inability to eat as evidenced by NPO status; ongoing.   Goal: Enteral nutrition to provide 60-70% of estimated calorie needs (22-25 kcals/kg ideal body weight) and 100% of estimated protein needs, based on ASPEN guidelines for permissive underfeeding in critically ill obese individuals, met.   Monitor:  Respiratory status, TF tolerance, labs  ASSESSMENT: Pt transferred from [facility] with status epilepticus and NSTEMI. Pt remains intubated.   Patient is currently intubated on ventilator support MV: 9 L/min Temp (24hrs), Avg:98.4 F (36.9 C), Min:97.9 F (36.6 C), Max:99.1 F (37.3 C)  Labs: Blood glucose: 140-185 Potassium low on supplementation, also receiving Magnesium sulfate  Pt discussed during ICU rounds and with RN.  Pt weaning hopeful for extubation, if not will need trach.   Free water d/c'ed  Height: Ht Readings from Last 1 Encounters:  03/18/14 5' 0.63" (1.54 m)    Weight: Wt Readings from Last 1 Encounters:  03/29/14 208 lb 5.4 oz (94.5 kg)  Admission weight: 200 lb (90.8 kg); Admission BMI 38.44  BMI:  Body mass index is 39.85 kg/(m^2).  Estimated Nutritional Needs: Kcal: 1972 Protein: >/= 90 grams Fluid: > 1.5 L/day  Skin: rash, skin tear, fissure, and ecchymosis  Diet Order: NPO   Intake/Output Summary (Last 24 hours) at 03/29/14 1005 Last data filed at 03/29/14 0715  Gross per 24 hour  Intake 1247.5 ml  Output   1950 ml  Net -702.5 ml    Last BM: via rectal tube 300 ml 10/12 150 ml 10/11 3 unmeasured 10/11  Labs:   Recent Labs Lab 03/27/14 0242 03/28/14 0244  03/29/14 0310  NA 142 143 145  K 4.1 4.3 3.1*  CL 112 112 110  CO2 17* 18* 22  BUN 26* 26* 27*  CREATININE 0.93 0.89 0.91  CALCIUM 8.6 8.5 8.6  MG 1.4* 1.5 1.5  PHOS 2.3 3.4 3.2  GLUCOSE 158* 172* 158*    CBG (last 3)   Recent Labs  03/29/14 0050 03/29/14 0411 03/29/14 0802  GLUCAP 185* 151* 140*    Scheduled Meds: . antiseptic oral rinse  7 mL Mouth Rinse QID  . artificial tears   Both Eyes 3 times per day  . aspirin  81 mg Oral Daily  . atorvastatin  80 mg Per Tube q1800  . chlorhexidine  15 mL Mouth Rinse BID  . clopidogrel  75 mg Oral Daily  . feeding supplement (PRO-STAT SUGAR FREE 64)  30 mL Per Tube TID  . feeding supplement (VITAL HIGH PROTEIN)  1,000 mL Per Tube Q24H  . heparin subcutaneous  5,000 Units Subcutaneous 3 times per day  . insulin aspart  0-15 Units Subcutaneous 6 times per day  . insulin glargine  40 Units Subcutaneous Daily  . lacosamide (VIMPAT) IV  100 mg Intravenous Q12H  . levETIRAcetam  1,500 mg Intravenous Q12H  . magnesium sulfate LVP 250-500 ml  6 g Intravenous Once  . multivitamin  5 mL Per Tube Daily  . pantoprazole sodium  40 mg Per Tube QHS  . phenytoin (DILANTIN) IV  100 mg Intravenous 3 times per day  . piperacillin-tazobactam (ZOSYN)  IV  3.375 g Intravenous Q8H  . potassium chloride  30 mEq Per Tube Q4H  . topiramate  200 mg Per Tube BID    Continuous Infusions: . sodium chloride    . dexmedetomidine Stopped (03/27/14 0805)    [place], [place], [place] Pager [phone] After Hours Pager

## 2014-03-30 ENCOUNTER — Inpatient Hospital Stay (HOSPITAL_COMMUNITY): Payer: Medicare Other

## 2014-03-30 LAB — GLUCOSE, CAPILLARY
Glucose-Capillary: 67 mg/dL — ABNORMAL LOW (ref 70–99)
Glucose-Capillary: 115 mg/dL — ABNORMAL HIGH (ref 70–99)
Glucose-Capillary: 83 mg/dL (ref 70–99)
Glucose-Capillary: 94 mg/dL (ref 70–99)
Glucose-Capillary: 105 mg/dL — ABNORMAL HIGH (ref 70–99)
Glucose-Capillary: 143 mg/dL — ABNORMAL HIGH (ref 70–99)

## 2014-03-30 LAB — BASIC METABOLIC PANEL
Anion gap: 16 — ABNORMAL HIGH (ref 5–15)
BUN: 21 mg/dL (ref 6–23)
CO2: 22 mEq/L (ref 19–32)
Calcium: 9 mg/dL (ref 8.4–10.5)
Chloride: 109 mEq/L (ref 96–112)
Creatinine, Ser: 0.91 mg/dL (ref 0.50–1.10)
GFR calc Af Amer: 72 mL/min — ABNORMAL LOW (ref >90)
GFR calc non Af Amer: 62 mL/min — ABNORMAL LOW (ref >90)
Glucose, Bld: 85 mg/dL (ref 70–99)
Potassium: 3.5 mEq/L — ABNORMAL LOW (ref 3.7–5.3)
Sodium: 147 mEq/L (ref 137–147)

## 2014-03-30 LAB — MAGNESIUM: Magnesium: 2.5 mg/dL (ref 1.5–2.5)

## 2014-03-30 LAB — CBC
HCT: 30.4 % — ABNORMAL LOW (ref 36.0–46.0)
Hemoglobin: 9.9 g/dL — ABNORMAL LOW (ref 12.0–15.0)
MCH: 28.8 pg (ref 26.0–34.0)
MCHC: 32.6 g/dL (ref 30.0–36.0)
MCV: 88.4 fL (ref 78.0–100.0)
Platelets: 625 10*3/uL — ABNORMAL HIGH (ref 150–400)
RBC: 3.44 MIL/uL — ABNORMAL LOW (ref 3.87–5.11)
RDW: 15.8 % — ABNORMAL HIGH (ref 11.5–15.5)
WBC: 12.1 10*3/uL — ABNORMAL HIGH (ref 4.0–10.5)

## 2014-03-30 LAB — PHOSPHORUS: Phosphorus: 3.5 mg/dL (ref 2.3–4.6)

## 2014-03-30 LAB — ANA: Anti Nuclear Antibody(ANA): NEGATIVE

## 2014-03-30 MED ORDER — POTASSIUM CHLORIDE 10 MEQ/100ML IV SOLN
10.0000 meq | INTRAVENOUS | Status: AC
  Administered 2014-03-30 (×2): 10 meq via INTRAVENOUS
  Filled 2014-03-30 (×2): qty 100

## 2014-03-30 MED ORDER — SODIUM CHLORIDE 0.9 % IJ SOLN
10.0000 mL | INTRAMUSCULAR | Status: DC | PRN
  Administered 2014-03-31: 10 mL
  Administered 2014-04-03: 40 mL
  Administered 2014-04-04 – 2014-04-07 (×5): 30 mL

## 2014-03-30 MED ORDER — DEXTROSE 50 % IV SOLN
25.0000 mL | Freq: Once | INTRAVENOUS | Status: AC | PRN
  Administered 2014-03-30: 25 mL via INTRAVENOUS

## 2014-03-30 MED ORDER — DEXTROSE 50 % IV SOLN
INTRAVENOUS | Status: AC
  Filled 2014-03-30: qty 50

## 2014-03-30 MED ORDER — TOPIRAMATE 100 MG PO TABS
100.0000 mg | ORAL_TABLET | Freq: Two times a day (BID) | ORAL | Status: DC
Start: 2014-03-30 — End: 2014-04-02
  Administered 2014-03-30 – 2014-04-02 (×6): 100 mg
  Filled 2014-03-30 (×7): qty 1

## 2014-03-30 MED ORDER — METOPROLOL TARTRATE 12.5 MG HALF TABLET
12.5000 mg | ORAL_TABLET | Freq: Two times a day (BID) | ORAL | Status: DC
  Administered 2014-03-31: 12.5 mg via ORAL
  Filled 2014-03-30 (×4): qty 1

## 2014-03-30 MED ORDER — OSMOLITE 1.2 CAL PO LIQD
1000.0000 mL | ORAL | Status: DC
  Administered 2014-03-30 – 2014-04-01 (×3): 1000 mL
  Filled 2014-03-30 (×7): qty 1000

## 2014-03-30 MED ORDER — PRO-STAT SUGAR FREE PO LIQD
30.0000 mL | Freq: Two times a day (BID) | ORAL | Status: DC
  Administered 2014-03-30 – 2014-04-01 (×5): 30 mL
  Administered 2014-04-02: 22:00:00
  Administered 2014-04-02: 30 mL
  Administered 2014-04-03 – 2014-04-04 (×3)
  Administered 2014-04-04 – 2014-04-05 (×2): 30 mL
  Filled 2014-03-30 (×13): qty 30

## 2014-03-30 MED ORDER — SODIUM CHLORIDE 0.9 % IJ SOLN
10.0000 mL | Freq: Two times a day (BID) | INTRAMUSCULAR | Status: DC
  Administered 2014-03-30: 20 mL
  Administered 2014-04-01 – 2014-04-06 (×8): 10 mL
  Administered 2014-04-07: 20 mL

## 2014-03-30 NOTE — Progress Notes (Signed)
Panda tube placed in right nare without difficulty. PT working with patient after placement. Will order KUB to confirm placement when patient done with PT. Continuing to monitor.

## 2014-03-30 NOTE — Progress Notes (Signed)
Patient to transfer to 6N rm 3. Report called to Alferd ApaJoan Kiang, RN. Awaiting KUB for placement verification of panda. Nursing to continue to monitor.

## 2014-03-30 NOTE — Progress Notes (Signed)
Subjective: Continues to improve neurologically  Exam: Filed Vitals:   03/30/14 0700  BP: 143/65  Pulse: 74  Temp:   Resp: 16   Gen: in bed, intubated MS: awake, alert, engages examiner. follwos commands briskly.  CN: Endorses vision in both hemifields,  Motor: moves all extremities to command, ? Left arm weakness, thoguh mild if present.  Sensory: endorses sensation bilaterally.   Autoantibodies pending (ro, hu, ri, yo, nmda, vgkc, vgcc, ana, crmp5, gad)   Impression: 73 yo F who presented with refractory status epilepticus. She had clinical correlate fo right arm twitching with left posterior recurrent runs of sharply contoured activity. The focal nature argues against a generalized metabolic etiology such as EtOH withdrawal or electrolyte abnormalities. She was in partial complex status at initiation of EEG and then was kept in burst suppression for 48 hours prior to resolution of status epilepticus.   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 With NORSE, approximately half remain cryptogenic while a significant percentage of the remaining are either infectious or autoimmune in nature. Her CSF was not supportive of an infectious nature.   She continues to improve. Her cognitive slowing could certainly be a result of topamax, and now that seizures are controlled, I would favor titrating this down.    Recommendations: 1) Continue dilantin, keppra, vimpat 2) decrease topamax to 100mg  BID 3) will continue to follow.   Ritta SlotMcNeill Kirkpatrick, MD [facility] [phone]  If 7pm- 7am, please page neurology on call as listed in AMION. 03/30/2014  9:31 AM

## 2014-03-30 NOTE — Progress Notes (Signed)
Patient Profile: 73 yo female w/ bipolar DO, DM, CKD, HLD, obesity, was admitted 10/02 early am w/ status epilepticus and NSTEMI. Troponin > 20, EF 50-55% with inf hypokinesis on echo.   Subjective: Extubated. Answers some questions.   Objective: Vital signs in last 24 hours: Temp:  [97.9 F (36.6 C)-99.2 F (37.3 C)] 98.9 F (37.2 C) (10/14 0331) Pulse Rate:  [41-81] 74 (10/14 0700) Resp:  [16-26] 16 (10/14 0700) BP: (105-145)/(38-67) 143/65 mmHg (10/14 0700) SpO2:  [73 %-100 %] 99 % (10/14 0700) FiO2 (%):  [30 %] 30 % (10/13 1100) Weight:  [205 lb 0.4 oz (93 kg)] 205 lb 0.4 oz (93 kg) (10/14 0331) Weight change: -3 lb 4.9 oz (-1.5 kg) Last BM Date: 03/29/14 Intake/Output from previous day: 10/13 0701 - 10/14 0700 In: 945 [NG/GT:140; IV Piggyback:805] Out: 4750 [Urine:4650; Stool:100] Intake/Output this shift:    PE:  General appearance:altert but slightly slow to respond  Lungs: few rales bases Heart: regular rate and rhythm  Extremities: no edema  Pulses: 2+ and symmetric  Skin: warm and dry   Lab Results:  Recent Labs  03/29/14 0310 03/30/14 0244  WBC 10.3 12.1*  HGB 8.0* 9.9*  HCT 25.4* 30.4*  PLT 496* 625*   BMET  Recent Labs  03/29/14 0310 03/30/14 0244  NA 145 147  K 3.1* 3.5*  CL 110 109  CO2 22 22  GLUCOSE 158* 85  BUN 27* 21  CREATININE 0.91 0.91  CALCIUM 8.6 9.0   Lab Results  Component Value Date   TRIG 111 03/23/2014     Lab Results  Component Value Date   TSH 2.090 12/15/2013   Studies/Results: Dg Chest Port 1 View 03/28/2014    IMPRESSION: Stable chest.   Electronically 2D Echo: Left ventricle: The cavity size was normal. Wall thickness was normal. Doppler parameters are consistent with abnormal left ventricular relaxation (grade 1 diastolic dysfunction).  LVEF is approximately 50 to 55% with inferior/inferoseptal hypokinesis  Medications: I have reviewed the patient's current medications. Scheduled Meds: .  antiseptic oral rinse  7 mL Mouth Rinse QID  . aspirin  81 mg Oral Daily  . atorvastatin  80 mg Per Tube q1800  . clopidogrel  75 mg Oral Daily  . feeding supplement (PRO-STAT SUGAR FREE 64)  30 mL Per Tube TID  . feeding supplement (VITAL HIGH PROTEIN)  1,000 mL Per Tube Q24H  . heparin subcutaneous  5,000 Units Subcutaneous 3 times per day  . insulin aspart  0-15 Units Subcutaneous 6 times per day  . insulin glargine  40 Units Subcutaneous Daily  . lacosamide (VIMPAT) IV  100 mg Intravenous Q12H  . levETIRAcetam  1,500 mg Intravenous Q12H  . multivitamin  5 mL Per Tube Daily  . pantoprazole sodium  40 mg Per Tube QHS  . phenytoin (DILANTIN) IV  100 mg Intravenous 3 times per day  . potassium chloride  10 mEq Intravenous Q1 Hr x 3  . topiramate  200 mg Per Tube BID   Continuous Infusions: . sodium chloride    . dexmedetomidine Stopped (03/27/14 0805)   PRN Meds:.sodium chloride, acetaminophen (TYLENOL) oral liquid 160 mg/5 mL, fentaNYL, sodium chloride  Assessment/Plan: 73 [facility] and [facility]. Intubated for airway protection, + NSTEMI with troponin > 20.00   1. Status epilepticus -no further seizure activity, improving mental status, trying to wean vent; if unable, plan for trach . No underlying cause found.  2. NSTEMI  - medical rx with ASA (changed to 81mg ), statin. Added clopidogrel to optimize medical mgt.  Will add beta blocker, metoprolol 12.5 BID. BP is improving. Encouraging EF.   3. Shock - improved, off Levophed  4. Acute Diastolic heart failure (volume overload) -improved - was given lasix previously.   5. LLL PNA suspected, on antibiotics per CCM  6. Acute respiratory failure-resolved now extubated.   7. Anemia - Hgb 8 - if <8 consider PRBC.   Management as per CCM soon to be TRH and neuro teams.  Once she recovers, if she has ischemic symptoms, would pursue ischemic eval. Right now, continue medical therapy. She  is on appropriate post-MI Rx with ASA, clopidogrel and a statin drug. Beta-blocker now added    LOS: 12 days  Donato SchultzSKAINS, MARK, MD 03/30/2014, 9:26 AM

## 2014-03-30 NOTE — Progress Notes (Signed)
Panda tube inserted R nare without difficulty. Awaiting placement verification by x-ray and ok from MD. Continuing to monitor.

## 2014-03-30 NOTE — Evaluation (Signed)
Clinical/Bedside Swallow Evaluation Patient Details  Name: Brooke DrossHarriett Lerew MRN: [id number] Date of Birth: 09-26-1940  Today's Date: 03/30/2014 Time: 0810-0830 SLP Time Calculation (min): 20 min  Past Medical History:  Past Medical History  Diagnosis Date  . Bipolar 1 disorder   . Diabetes   . Anxiety   . CKD (chronic kidney disease)   . Hypercholesteremia   . Migraine   . Sciatica   . Vertigo, labyrinthine   . Tremor   . Abnormality of gait 12/15/2013  . Obese   . Hypertension   . NSTEMI, initial episode of care 03/18/2014   Past Surgical History:  Past Surgical History  Procedure Laterality Date  . Neck surgery  1990s  . Colonoscopy  2010  . Cholecsystectomy  1977  . Tonsillectomy    . Cataract extraction Bilateral   . Degenerative disc diease     HPI:  73 [facility] and [facility]STEMI. Intubated for airway protection 10/2 - 10/13. Hx of Bipolar, essential tremor, anxiety, Migraine, Vertigo. MRI negative.    Assessment / Plan / Recommendation Clinical Impression  Pt demonstrates evidence of an acute reversible dysphagia due to prolonged intubation and generalized weakness pt demonstrates immediate evidence of aspiration with thin liquids and puree (cough) and has diffuse oral and probably oropharyngeal residuals with puree particularly due to decreased strength. Recommend pt remain NPO today, SLP will f/u tomorrow though suspect several days may be needed for adequate recovery for safe swallowing. Suggest short term alternate nutrition for administration of medications.     Aspiration Risk  Severe    Diet Recommendation NPO   Medication Administration: Via alternative means    Other  Recommendations Oral Care Recommendations: Oral care Q4 per protocol   Follow Up Recommendations       Frequency and Duration min 3x week  2 weeks   Pertinent Vitals/Pain NA    SLP Swallow Goals     Swallow Study Prior Functional  Status       General HPI: 73 [facility] and [facility]STEMI. Intubated for airway protection 10/2 - 10/13. Hx of Bipolar, essential tremor, anxiety, Migraine, Vertigo. MRI negative.  Type of Study: Bedside swallow evaluation Previous Swallow Assessment: none Diet Prior to this Study: NPO Temperature Spikes Noted: No Respiratory Status: Room air History of Recent Intubation: Yes Length of Intubations (days): 11 days Date extubated: 03/29/14 Behavior/Cognition: Alert;Confused Oral Cavity - Dentition: Adequate natural dentition Self-Feeding Abilities: Needs assist Patient Positioning: Upright in bed Baseline Vocal Quality: Hoarse;Low vocal intensity Volitional Cough: Strong Volitional Swallow: Able to elicit    Oral/Motor/Sensory Function Overall Oral Motor/Sensory Function: Other (comment) (generalized weakness)   Ice Chips Ice chips: Within functional limits   Thin Liquid Thin Liquid: Impaired Presentation: Cup Oral Phase Impairments: Reduced labial seal;Impaired anterior to posterior transit Oral Phase Functional Implications: Right anterior spillage;Left anterior spillage Pharyngeal  Phase Impairments: Suspected delayed Swallow;Decreased hyoid-laryngeal movement;Multiple swallows;Cough - Immediate    Nectar Thick Nectar Thick Liquid: Not tested   Honey Thick Honey Thick Liquid: Not tested   Puree Puree: Impaired Presentation: Spoon Oral Phase Impairments: Impaired anterior to posterior transit;Reduced lingual movement/coordination Oral Phase Functional Implications: Oral residue;Prolonged oral transit Pharyngeal Phase Impairments: Suspected delayed Swallow;Cough - Immediate;Multiple swallows   Solid   GO    Solid: Not tested      Harlon DittyBonnie DeBlois, MA CCC-SLP [phone]  Claudine MoutonDeBlois, Bonnie Caroline 03/30/2014,8:32 AM

## 2014-03-30 NOTE — Progress Notes (Signed)
Rutherford Guysahul Desai, PA-C made aware unable to administer PO meds s/t patient pulled panda out. Aware patient failed swallow evaluation with speech this morning. Orders received to place another panda. Continuing to monitor.

## 2014-03-30 NOTE — Evaluation (Signed)
Physical Therapy Evaluation Patient Details Name: Brooke DrossHarriett Johnsey MRN: [id number] DOB: 02-24-1941 Today's Date: 03/30/2014   History of Present Illness  73 [facility]pticus and NSTEMI. Intubated for airway protection 10/2 - 10/13. Hx of Bipolar, essential tremor, anxiety, Migraine, Vertigo. MRI negative  Clinical Impression  Patient demonstrates deficits in functional mobility as indicated below. Will need continued skilled PT to address deficits and maximize function. Will see as indicated and progress as tolerated. Recommend SNF upon acute discharge.    Follow Up Recommendations SNF;Supervision/Assistance - 24 hour    Equipment Recommendations  Other (comment) (TBD)    Recommendations for Other Services       Precautions / Restrictions Precautions Precautions: Fall Restrictions Weight Bearing Restrictions: No      Mobility  Bed Mobility Overal bed mobility: Needs Assistance Bed Mobility: Supine to Sit;Sit to Supine     Supine to sit: Mod assist;+2 for physical assistance Sit to supine: Mod assist;+2 for physical assistance   General bed mobility comments: increased assist for elevation of trunk to upright and for support of trunk and legs returning to supine. patient slow to initate mavement multi-modal cues   Transfers Overall transfer level: Needs assistance Equipment used:  (+2 wrap around support with gait belt) Transfers: Sit to/from Stand Sit to Stand: +2 physical assistance;Max assist         General transfer comment: difficulty coming to upright despite +2 increased assist. Minimal clearance from bed on first 2 attempts.  ASsist to standing with use of chair back for UE support  Ambulation/Gait                Stairs            Wheelchair Mobility    Modified Rankin (Stroke Patients Only)       Balance Overall balance assessment: Needs assistance Sitting-balance support: Feet  supported Sitting balance-Leahy Scale: Poor   Postural control: Posterior lean Standing balance support: Bilateral upper extremity supported;During functional activity Standing balance-Leahy Scale: Zero (to poor)                               Pertinent Vitals/Pain Pain Assessment: No/denies pain    Home Living Family/patient expects to be discharged to:: Skilled nursing facility Living Arrangements: Alone   Type of Home: House                Prior Function           Comments: unknown, patient poor historian at this time     Hand Dominance        Extremity/Trunk Assessment   Upper Extremity Assessment: Defer to OT evaluation           Lower Extremity Assessment: Generalized weakness;Difficult to assess due to impaired cognition         Communication   Communication: Expressive difficulties  Cognition Arousal/Alertness: Awake/alert Behavior During Therapy: Restless Overall Cognitive Status: No family/caregiver present to determine baseline cognitive functioning Area of Impairment: Orientation;Attention;Following commands;Safety/judgement;Awareness;Problem solving Orientation Level: Disoriented to;Situation Current Attention Level: Focused   Following Commands: Follows one step commands consistently;Follows multi-step commands inconsistently Safety/Judgement: Decreased awareness of safety;Decreased awareness of deficits Awareness: Intellectual Problem Solving: Slow processing;Decreased initiation;Requires verbal cues;Requires tactile cues General Comments: at conclusion of session patient calling for help, could not change TV channels with mitts on    General Comments General comments (skin integrity, edema, etc.): BP elevated 160s  systolic    Exercises        Assessment/Plan    PT Assessment Patient needs continued PT services  PT Diagnosis Difficulty walking;Generalized weakness;Altered mental status   PT Problem List Decreased  strength;Decreased activity tolerance;Decreased balance;Decreased mobility;Decreased coordination;Decreased cognition;Decreased safety awareness;Obesity  PT Treatment Interventions DME instruction;Gait training;Functional mobility training;Therapeutic activities;Therapeutic exercise;Balance training;Patient/family education   PT Goals (Current goals can be found in the Care Plan section) Acute Rehab PT Goals Patient Stated Goal: to see her dog brutus PT Goal Formulation: With patient Time For Goal Achievement: 04/13/14 Potential to Achieve Goals: Fair    Frequency Min 3X/week   Barriers to discharge Decreased caregiver support      Co-evaluation PT/OT/SLP Co-Evaluation/Treatment: Yes Reason for Co-Treatment: Complexity of the patient's impairments (multi-system involvement);Necessary to address cognition/behavior during functional activity;For patient/therapist safety PT goals addressed during session: Mobility/safety with mobility         End of Session Equipment Utilized During Treatment: Gait belt Activity Tolerance: Patient tolerated treatment well;Patient limited by fatigue Patient left: in bed;with call bell/phone within reach (soft mitts applied) Nurse Communication: Mobility status         Time: 1412-1440 PT Time Calculation (min): 28 min   Charges:   PT Evaluation $Initial PT Evaluation Tier I: 1 Procedure PT Treatments $Therapeutic Activity: 8-22 mins   PT G CodesFabio Asa:          Werner, Devon J 03/30/2014, 3:18 PM Charlotte Crumbevon Werner, PT DPT  [phone]

## 2014-03-30 NOTE — Progress Notes (Signed)
OT Cancellation Note  Patient Details Name: Brooke Gross MRN: [id number] DOB: January 10, 1941   Cancelled Treatment:    Reason Eval/Treat Not Completed: Patient at procedure or test/ unavailable (In process of having PICC placed)  [facility] Surgical Suit[facility] LLCWARD,HILLARY [name], OTR/L  [phone] 03/30/2014 03/30/2014, 11:24 AM

## 2014-03-30 NOTE — Progress Notes (Addendum)
Occupational Therapy Evaluation Patient Details Name: Brooke DrossHarriett Leeder MRN: [id number] DOB: 20-Dec-1940 Today's Date: 03/30/2014    History of Present Illness 73 yo [facility] with status epilepticus and NSTEMI. Intubated for airway protection 10/2 - 10/13. Hx of Bipolar, essential tremor, anxiety, Migraine, Vertigo. MRI negative   Clinical Impression   Unsure of PLOF, but pt reports she was independent and lived alone. Pt with significant funcitonal decline due to deficits below and will need rehab @ SNF.  Pt will benefit from skilled OT services to maximize functional level of independence with ADL and  facilitate D/C to SNF due to below deficits.    Follow Up Recommendations  SNF;Supervision/Assistance - 24 hour    Equipment Recommendations  None recommended by OT    Recommendations for Other Services       Precautions / Restrictions Precautions Precautions: Fall Restrictions Weight Bearing Restrictions: No      Mobility Bed Mobility Overal bed mobility: +2 for physical assistance;Needs Assistance Bed Mobility: Supine to Sit;Sit to Supine     Supine to sit: Mod assist;+2 for physical assistance Sit to supine: Mod assist;+2 for physical assistance   General bed mobility comments: Pt able to initiate movement of legs off bed; assist need for elevating trunk off bed  Transfers Overall transfer level: Needs assistance Equipment used: 2 person hand held assist Transfers: Sit to/from Stand Sit to Stand: +2 physical assistance;Max assist         General transfer comment: Pt able to initiate ant weight shift. unable to achieve full upright posture    Balance Overall balance assessment: Needs assistance Sitting-balance support: Feet supported Sitting balance-Leahy Scale: Poor   Postural control: Left lateral lean Standing balance support: Bilateral upper extremity supported;During functional activity Standing balance-Leahy Scale: Zero                               ADL Overall ADL's : Needs assistance/impaired Eating/Feeding: NPO   Grooming: Maximal assistance Grooming Details (indicate cue type and reason): Pt unable to wipe mouth with cloth due to weakness Upper Body Bathing: Maximal assistance   Lower Body Bathing: Total assistance   Upper Body Dressing : Maximal assistance   Lower Body Dressing: Total assistance       Toileting- Clothing Manipulation and Hygiene: Total assistance (foley and rectal tube)       Functional mobility during ADLs: +2 for physical assistance;Maximal assistance;Cueing for safety;Cueing for sequencing       Vision                     Perception     Praxis Praxis Praxis tested?: Deficits Deficits: Initiation;Perseveration    Pertinent Vitals/Pain Pain Assessment: Faces Faces Pain Scale: Hurts little more Pain Location: unable to determine Pain Intervention(s): Limited activity within patient's tolerance;Monitored during session     Hand Dominance     Extremity/Trunk Assessment Upper Extremity Assessment Upper Extremity Assessment: Generalized weakness   Lower Extremity Assessment Lower Extremity Assessment: Defer to PT evaluation (LUE appears weakner than left. Pt overall 2+/5 shoulders; 3/)   Cervical / Trunk Assessment Cervical / Trunk Assessment: Kyphotic   Communication Communication Communication: Expressive difficulties;Other (comment) (slow speech; poor vocal quality)   Cognition Arousal/Alertness: Awake/alert Behavior During Therapy: Restless;Flat affect Overall Cognitive Status: Impaired/Different from baseline (pt lived alone PTA) Area of Impairment: Orientation;Attention;Memory;Following commands;Safety/judgement;Awareness;Problem solving Orientation Level: Disoriented to;Place;Situation ("[facility]") Current Attention Level: Sustained Memory: Decreased short-term memory  Following Commands: Follows one step commands with increased  time Safety/Judgement: Decreased awareness of safety;Decreased awareness of deficits Awareness: Intellectual Problem Solving: Slow processing;Decreased initiation;Difficulty sequencing;Requires verbal cues;Requires tactile cues General Comments: Increased alertness after mobilizing; assisted pt to change channel due to her calling outfor help due to use of mittens; perseverating    General Comments       Exercises       Shoulder Instructions      Home Living Family/patient expects to be discharged to:: Skilled nursing facility Living Arrangements: Alone   Type of Home: House                                  Prior Functioning/Environment Level of Independence: Independent (per pt)        Comments: unknown, patient poor historian at this time    OT Diagnosis: Generalized weakness;Cognitive deficits   OT Problem List: Decreased strength;Decreased range of motion;Decreased activity tolerance;Impaired balance (sitting and/or standing);Decreased coordination;Decreased cognition;Decreased safety awareness;Decreased knowledge of use of DME or AE;Cardiopulmonary status limiting activity;Obesity;Impaired UE functional use   OT Treatment/Interventions: Self-care/ADL training;Therapeutic exercise;DME and/or AE instruction;Therapeutic activities;Cognitive remediation/compensation;Patient/family education;Balance training    OT Goals(Current goals can be found in the care plan section) Acute Rehab OT Goals Patient Stated Goal: to see her dog brutus OT Goal Formulation: With patient Time For Goal Achievement: 04/13/14 Potential to Achieve Goals: Good  OT Frequency: Min 2X/week   Barriers to D/C: Decreased caregiver support          Co-evaluation PT/OT/SLP Co-Evaluation/Treatment: Yes Reason for Co-Treatment: Complexity of the patient's impairments (multi-system involvement);For patient/therapist safety PT goals addressed during session: Mobility/safety with  mobility OT goals addressed during session: ADL's and self-care      End of Session Equipment Utilized During Treatment: Gait belt Nurse Communication: Mobility status  Activity Tolerance: Patient tolerated treatment well Patient left: in bed;with call bell/phone within reach;with bed alarm set   Time: 1412-1440 OT Time Calculation (min): 28 min Charges:  OT General Charges $OT Visit: 1 Procedure OT Evaluation $Initial OT Evaluation Tier I: 1 Procedure OT Treatments $Self Care/Home Management : 8-22 mins G-Codes:    WARD,HILLARY 03/30/2014, 4:28 PM   [facility] Ward, OTR/L  [phone] 03/30/2014

## 2014-03-30 NOTE — Progress Notes (Signed)
Rahul Celine Mansesai, PA-C made aware panda placed per MD order and verified by KUB. PA-C to verify ok to use panda. Continuing to monitor.

## 2014-03-30 NOTE — Progress Notes (Signed)
Peripherally Inserted Central Catheter/Midline Placement  The IV Nurse has discussed with the patient and/or persons authorized to consent for the patient, the purpose of this procedure and the potential benefits and risks involved with this procedure.  The benefits include less needle sticks, lab draws from the catheter and patient may be discharged home with the catheter.  Risks include, but not limited to, infection, bleeding, blood clot (thrombus formation), and puncture of an artery; nerve damage and irregular heat beat.  Alternatives to this procedure were also discussed.  PICC/Midline Placement Documentation     Phone consent from son   Vevelyn PatDuncan, Rhea Jean 03/30/2014, 11:28 AM

## 2014-03-30 NOTE — Progress Notes (Signed)
PULMONARY / CRITICAL CARE MEDICINE   Name: Brooke Gross: [id number] DOB: Aug 14, 1940    ADMISSION DATE:  03/18/2014  REFERRING MD :  Brooke Gross  CHIEF COMPLAINT:  Seizure  INITIAL PRESENTATION:  73 [facility]pticus and NSTEMI.  Intubated for airway protection.  PCCM asked to admit to ICU.  SIGNIFICANT EVENTS/STUDIES:  10/02 [facility] int[facility]ated shortly after arrival. Dopamine initiated for hypotension. Vanc, acyclovir, ceftriaxone and ampicillin initiated empirically 10/02 CT head [facility](Brooke Gross): no acute abn 10/02 Neuro consult: Etiology of seizure activity is unclear. Acute ischemic cerebral infarction cannot be ruled out.  Recommendations: Continue Keppra IV at 1000 mg every 12 hours. Continue Vimpat at 100 mg every 12 hours. Propofol IV for sedation as well as as seizure control. EEG to rule out continued subclinical seizure activity. MRI of the brain without contrast to rule out acute cerebral infarction. 10/02 Cards consult: Her biomarkers suggest a fairly large MI and ECG has many high risk features. 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 ASA, statin, heparin initiated. Beta blocker not option due to hypotension.  10/02 TTE:  LVEF is approximately 50 to 55% with inferior/inferoseptal hypokinesis, grade 1 diastolic dysfx 10/03 No WUA. Remained on dopamine 10/03 Persistent seizure-like activity despite mutliple anti-consvulsants. Burst suppression initiated. LP recommended to R/O infectious etiology 10/03 cont EEG: Burst suppression, asymmetric, with greater suppression on the right. Semi-periodic epileptiform discharges, left hemisphere, maximal over the mid/posterior temporal region. The findings indicate a severe diffuse encephalopathy with epileptogenic potential noted over the left hemisphere, particularly over the left posterior temporal-occipital region. No ongoing seizure activity was observed that would explain an altered  mental status 10/04 Remained in medically induced coma and on dopamine.  10/04 LP performed by Neuro. CSF: glu 123, protein 30, RBC 3, WBC 8, 52% lymphs 10/05 Unable to tolerated decrease in propofol due to tachypnea. Ketamine initiated. Propofol weaned to off. DA transitioned to NE. Anti-bacterial abx discontinued. Acyclovir continued 10/05 CT head: no acute abn 10/06 Low grade fever. Resp cx and C diff PCR ordered. Ketamine stopped. Midaz gtt @ 10 mg/hr. No overt seizures. Continuous EEG 10/08 No further seizure. Remained encephalopathic. Tachypneic on WUA/SBT. Nearly off vasopressors. Remained severely volume overloaded. Lasix ordered 10/09 Continuous EEG discontinued. Tachypneic and excessive coughing when dexmedetomidine gtt discontinued. Son updated in detail @ bedside. He indicated that he would not want to pursue trach tube and long term care if neuro prognosis for functional recovery is not good 10/09 MRI brain >> negative 10/10 opening eyes to command 10/12 extubated 10/13 made LCB with no CPR/cardioversion/intubation, refused tracheostomy.  INDWELLING DEVICES:: ETT 10/02 >> 10/13 R IJ CVL 10/02 >>10/11  MICRO DATA: PCT 10/07: 0.42,  10/08: 0.32.  10/09: 0.37 MRSA PCR 10/02 >> POS CSF 10/04 >> NEG HSV PCR (CSF) 10/04 >> NEG C diff PCR 10/06 >> NEG Resp 10/06 >> NEG Urine 10/07 >> NEG Blood 10/07 >> NTD  ANTIMICROBIALS:  Vanc 10/02 >> 10/05 Ceftriaxone 10/02 >> 10/05 Ampicillin 10/02 >> 10/05 Acyclovir 10/02 >> 10/08 Vancomycin 10/7 >> 10/09 Zosyn 10/7 >> 10/14  SUBJECTIVE:  Alert and interactive, following commands but was unable to swallow overnight per nursing assessment  VITAL SIGNS: Temp:  [97.9 F (36.6 C)-99.2 F (37.3 C)] 98.9 F (37.2 C) (10/14 0331) Pulse Rate:  [41-81] 74 (10/14 0700) Resp:  [14-26] 16 (10/14 0700) BP: (105-145)/(38-96) 143/65 mmHg (10/14 0700) SpO2:  [73 %-100 %] 99 % (10/14 0700) FiO2 (%):  [30 %]  30 % (10/13 1100) Weight:  [93 kg  (205 lb 0.4 oz)] 93 kg (205 lb 0.4 oz) (10/14 0331)  VENTILATOR SETTINGS: Vent Mode:  [-] CPAP;PSV FiO2 (%):  [30 %] 30 % PEEP:  [5 cmH20] 5 cmH20 Pressure Support:  [8 cmH20] 8 cmH20  INTAKE / OUTPUT:  Intake/Output Summary (Last 24 hours) at 03/30/14 0727 Last data filed at 03/30/14 0600  Gross per 24 hour  Intake    945 ml  Output   4750 ml  Net  -3805 ml   PHYSICAL EXAMINATION: General: Awake and interactive Neuro: moving all ext to command HEENT: NCAT, PERRL Cardiovascular:  Regular, no murmur Lungs: Clear interiorly  Abdomen:  Soft, non tender, +BS Ext: Warm, no edema  LABS: I have reviewed all of today's lab results. Relevant abnormalities are discussed in the A/P section  CXR: NSC hazy LLL opacity  ASSESSMENT / PLAN:  PULMONARY A: Acute respiratory failure Pulm edema, improved LLL AS dz - suspect PNA P:   Extubated and made LCB with no CPR/cardioversion/ETT, family refused trach/peg. Cont vent bundle. Hold further diuresis for today Titrate O2 for sats. Mobilize. PT  CARDIOVASCULAR A:  NSTEMI  Hx of HTN, HLD. Hypotension, improving P:  Cards following from distance - will call back once more stable from a neuro and respiratory standpoint D/Ced pressors  RENAL A:   CKD, Cr appears to be @ baseline Hypokalemia, resolved Hypervolemia P:   Monitor BMET intermittently. Monitor I/Os. Correct electrolytes as indicated. Hold further lasix for now Replace K. D/C free water  GASTROINTESTINAL A:   No issues P:   SUP: enteral PPI. D/C TF for extubation. Swallow evaluation.  HEMATOLOGIC A:   Mild ICU associated anemia P:  DVT px: SQ heparin. Monitor CBC intermittently. Transfuse per usual ICU guidelines noting recent NSTEMI.  INFECTIOUS A: Fever - trending down Leukocytosis - resolved Suspected LLL PNA P:   Micro and abx as above. DC vanc 10/09. Complete 7 days pip-tazo, now off.  ENDOCRINE A:   DM type II, marginal control   P:   Cont SSI Cont Lantus  NEUROLOGIC A:   Status epilepticus,controlled Hx of Bipolar, essential tremor, anxiety, Migraine, Vertigo. P:   D/C all sedation. AED's per neurology. Daily WUA.  [facility]M will sign off, please call back if needed.  Family updated: No family bedside today  Interdisciplinary Family Meeting v Palliative Care Meeting: N/A  Alyson ReedyWesam G. Yacoub, M.D. [facility] Pulmonary/Critical Care Medicine. Pager: [phone]. After hours pager: [phone].

## 2014-03-30 NOTE — Progress Notes (Signed)
Dr. Molli KnockYacoub made aware patient failed bedside swallow evaluation by ST this morning. Patient NPO. Will place panda for meds and tube feedings. Continuing to monitor.

## 2014-03-30 NOTE — Progress Notes (Signed)
NUTRITION FOLLOW-UP  DOCUMENTATION CODES Per approved criteria  -Obesity Unspecified   INTERVENTION:  Initiate Osmolite 1.2 @ 50 ml/hr via NG tube.   30 ml Prostat BID.    Tube feeding regimen provides 1640 kcal, 96 grams of protein, and 984 ml of H2O.    NUTRITION DIAGNOSIS: Inadequate oral intake related to inability to eat as evidenced by NPO status; ongoing.   Goal: Enteral nutrition to provide 60-70% of estimated calorie needs (22-25 kcals/kg ideal body weight) and 100% of estimated protein needs, based on ASPEN guidelines for permissive underfeeding in critically ill obese individuals, NA  New goal: Pt to meet >/= 90% of their estimated nutrition needs   Monitor:  TF tolerance, labs  ASSESSMENT: [facility] and NSTEMI. Pt intubated and extubated 10/13.  Pt failed swallow evaluation, NG tube placed.  Family has refused trach and PEG.   Labs: Blood glucose: 67-115 Potassium low   Pt discussed during ICU rounds and with RN.  Free water d/c'ed  Height: Ht Readings from Last 1 Encounters:  03/18/14 5' 0.63" (1.54 m)    Weight: Wt Readings from Last 1 Encounters:  03/30/14 205 lb 0.4 oz (93 kg)  Admission weight: 200 lb (90.8 kg); Admission BMI 38.44  BMI:  Body mass index is 39.21 kg/(m^2).  Estimated Nutritional Needs: Kcal: 1500-1700 Protein: 80-100 grams Fluid: > 1.5 L/day  Skin: rash, skin tear, fissure, and ecchymosis  Diet Order: NPO   Intake/Output Summary (Last 24 hours) at 03/30/14 1249 Last data filed at 03/30/14 1000  Gross per 24 hour  Intake    785 ml  Output   4890 ml  Net  -4105 ml    Last BM: via rectal tube 100 ml 10/13 300 ml 10/12 150 ml 10/11 3 unmeasured 10/11  Labs:   Recent Labs Lab 03/28/14 0244 03/29/14 0310 03/30/14 0244  NA 143 145 147  K 4.3 3.1* 3.5*  CL 112 110 109  CO2 18* 22 22  BUN 26* 27* 21  CREATININE 0.89 0.91 0.91  CALCIUM 8.5 8.6 9.0  MG 1.5 1.5  2.5  PHOS 3.4 3.2 3.5  GLUCOSE 172* 158* 85    CBG (last 3)   Recent Labs  03/30/14 0329 03/30/14 0403 03/30/14 0840  GLUCAP 67* 115* 83    Scheduled Meds: . antiseptic oral rinse  7 mL Mouth Rinse QID  . aspirin  81 mg Oral Daily  . atorvastatin  80 mg Per Tube q1800  . clopidogrel  75 mg Oral Daily  . feeding supplement (PRO-STAT SUGAR FREE 64)  30 mL Per Tube TID  . feeding supplement (VITAL HIGH PROTEIN)  1,000 mL Per Tube Q24H  . heparin subcutaneous  5,000 Units Subcutaneous 3 times per day  . insulin aspart  0-15 Units Subcutaneous 6 times per day  . insulin glargine  40 Units Subcutaneous Daily  . lacosamide (VIMPAT) IV  100 mg Intravenous Q12H  . levETIRAcetam  1,500 mg Intravenous Q12H  . metoprolol tartrate  12.5 mg Oral BID  . multivitamin  5 mL Per Tube Daily  . pantoprazole sodium  40 mg Per Tube QHS  . phenytoin (DILANTIN) IV  100 mg Intravenous 3 times per day  . sodium chloride  10-40 mL Intracatheter Q12H  . topiramate  200 mg Per Tube BID    Continuous Infusions: . sodium chloride    . dexmedetomidine Stopped (03/27/14 0805)    Kendell BaneHeather Pitts RD, LDN, CNSC [phone] Pager  [phone] After Hours Pager

## 2014-03-31 DIAGNOSIS — I214 Non-ST elevation (NSTEMI) myocardial infarction: Secondary | ICD-10-CM

## 2014-03-31 DIAGNOSIS — E119 Type 2 diabetes mellitus without complications: Secondary | ICD-10-CM

## 2014-03-31 DIAGNOSIS — I5031 Acute diastolic (congestive) heart failure: Secondary | ICD-10-CM

## 2014-03-31 DIAGNOSIS — D649 Anemia, unspecified: Secondary | ICD-10-CM

## 2014-03-31 LAB — GLUCOSE, CAPILLARY
Glucose-Capillary: 169 mg/dL — ABNORMAL HIGH (ref 70–99)
Glucose-Capillary: 181 mg/dL — ABNORMAL HIGH (ref 70–99)
Glucose-Capillary: 200 mg/dL — ABNORMAL HIGH (ref 70–99)
Glucose-Capillary: 203 mg/dL — ABNORMAL HIGH (ref 70–99)
Glucose-Capillary: 213 mg/dL — ABNORMAL HIGH (ref 70–99)
Glucose-Capillary: 223 mg/dL — ABNORMAL HIGH (ref 70–99)

## 2014-03-31 LAB — BASIC METABOLIC PANEL
Anion gap: 14 (ref 5–15)
BUN: 25 mg/dL — ABNORMAL HIGH (ref 6–23)
CO2: 22 mEq/L (ref 19–32)
Calcium: 9.2 mg/dL (ref 8.4–10.5)
Chloride: 111 mEq/L (ref 96–112)
Creatinine, Ser: 0.79 mg/dL (ref 0.50–1.10)
GFR calc Af Amer: >90 mL/min (ref >90)
GFR calc non Af Amer: 82 mL/min — ABNORMAL LOW (ref >90)
Glucose, Bld: 222 mg/dL — ABNORMAL HIGH (ref 70–99)
Potassium: 3.5 mEq/L — ABNORMAL LOW (ref 3.7–5.3)
Sodium: 147 mEq/L (ref 137–147)

## 2014-03-31 LAB — PHOSPHORUS: Phosphorus: 1.8 mg/dL — ABNORMAL LOW (ref 2.3–4.6)

## 2014-03-31 LAB — CBC
HCT: 29.1 % — ABNORMAL LOW (ref 36.0–46.0)
Hemoglobin: 9.1 g/dL — ABNORMAL LOW (ref 12.0–15.0)
MCH: 27.7 pg (ref 26.0–34.0)
MCHC: 31.3 g/dL (ref 30.0–36.0)
MCV: 88.4 fL (ref 78.0–100.0)
Platelets: 639 10*3/uL — ABNORMAL HIGH (ref 150–400)
RBC: 3.29 MIL/uL — ABNORMAL LOW (ref 3.87–5.11)
RDW: 16 % — ABNORMAL HIGH (ref 11.5–15.5)
WBC: 9.5 10*3/uL (ref 4.0–10.5)

## 2014-03-31 LAB — GLUTAMIC ACID DECARBOXYLASE AUTO ABS: Glutamic Acid Decarb Ab: <1 U/mL (ref <=1.0)

## 2014-03-31 LAB — SJOGRENS SYNDROME-A EXTRACTABLE NUCLEAR ANTIBODY: SSA (Ro) (ENA) Antibody, IgG: <1

## 2014-03-31 LAB — SJOGRENS SYNDROME-B EXTRACTABLE NUCLEAR ANTIBODY: SSB (La) (ENA) Antibody, IgG: <1

## 2014-03-31 LAB — PHENYTOIN LEVEL, TOTAL: Phenytoin Lvl: 6.1 ug/mL — ABNORMAL LOW (ref 10.0–20.0)

## 2014-03-31 LAB — MAGNESIUM: Magnesium: 1.9 mg/dL (ref 1.5–2.5)

## 2014-03-31 MED ORDER — METOPROLOL TARTRATE 25 MG PO TABS
25.0000 mg | ORAL_TABLET | Freq: Two times a day (BID) | ORAL | Status: DC
  Administered 2014-03-31: 25 mg via ORAL
  Filled 2014-03-31 (×3): qty 1

## 2014-03-31 NOTE — Clinical Social Work Note (Signed)
.  Patient transferred to unit  6N. I have reported to CSW covering who will proceed with d/c plans for possible SNF at d/c.  Reece LevyJanet Caldwell, MSW, Theresia MajorsLCSWA [phone]

## 2014-03-31 NOTE — Progress Notes (Signed)
Subjective: Continues to be confused. States that she is hearing her mother.   Exam: Filed Vitals:   03/31/14 0602  BP: 152/72  Pulse: 77  Temp: 98.3 F (36.8 C)  Resp: 18   Gen: in bed, intubated MS: awake, alert, engages examiner. follows commands briskly. She has a lower latency of response today, but is not oriented(gives [facility] as hospital), 1986 CN: Endorses vision in both hemifields, PERRL, EOMI Motor: moves all extremities well Sensory: endorses symmetric sensation bilaterally.   Autoantibodies pending (ro, hu, ri, yo, nmda, vgkc, vgcc, crmp5, gad) ANS - negative  Impression: 73 yo F who presented with refractory status epilepticus. She had clinical correlate fo right arm twitching with left posterior recurrent runs of sharply contoured activity. The focal nature argues against a generalized metabolic etiology such as EtOH withdrawal or electrolyte abnormalities. She was in partial complex status at initiation of EEG and then was kept in burst suppression for 48 hours prior to resolution of status epilepticus.   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 With NORSE, approximately half remain cryptogenic while a significant percentage of the remaining are either infectious or autoimmune in nature. Her CSF was not supportive of an infectious nature.   She continues to improve, but has some signs of delirium.   Recommendations: 1) Continue dilantin, keppra, vimpat 2) continue topamax 100mg  BID 3) check dilantin level.  4) will continue to follow.   Brooke SlotMcNeill Kirkpatrick, MD [facility] [phone]  If 7pm- 7am, please page neurology on call as listed in AMION. 03/31/2014  8:48 AM

## 2014-03-31 NOTE — Clinical Social Work Placement (Addendum)
Clinical Social Work Department CLINICAL SOCIAL WORK PLACEMENT NOTE 03/31/2014  Patient:  Brooke Gross,Brooke Gross  Account Number:  [id number] Admit date:  03/18/2014  Clinical Social Worker:  Merlyn LotJENNA HOLOMAN, CLINICAL SOCIAL WORKER  Date/time:  03/31/2014 02:30 PM  Clinical Social Work is seeking post-discharge placement for this patient at the following level of care:   SKILLED NURSING   (*CSW will update this form in Epic as items are completed)   03/31/2014  Patient/family provided with [facility] Department of Clinical Social Work's list of facilities offering this level of care within the geographic area requested by the patient (or if unable, by the patient's family).  03/31/2014  Patient/family informed of their freedom to choose among providers that offer the needed level of care, that participate in Medicare, Medicaid or managed care program needed by the patient, have an available bed and are willing to accept the patient.  03/31/2014  Patient/family informed of [facility]' ownership interest in [facility], as well as of the fact that they are under no obligation to receive care at this facility.  PASARR submitted to EDS on 03/31/2014 PASARR number received on 03/31/2014  FL2 transmitted to all facilities in geographic area requested by pt/family on  03/31/2014 FL2 transmitted to all facilities within larger geographic area on   Patient informed that his/her managed care company has contracts with or will negotiate with  certain facilities, including the following:     Patient/family informed of bed offers received:  04/01/14 [facility] Physician recommends and patient chooses bed at    [facility] on  04/07/14 Patient to be transferred to facility by [facility] Patient and family notified of transfer on 04/07/14 Name of family member notified:  Lynford HumphreyAllan Robertson  The following physician request were entered in  Epic:   Additional Comments:    Merlyn LotJenna Holoman, [facility] Clinical Social Worker [phone]

## 2014-03-31 NOTE — Clinical Social Work Placement (Signed)
Clinical Social Work Department BRIEF PSYCHOSOCIAL ASSESSMENT 03/31/2014  Patient:  Brooke Gross,Brooke Gross     Account Number:  [id number]     Admit date:  03/18/2014  Clinical Social Worker:  Merlyn LotHOLOMAN,JENNA, CLINICAL SOCIAL WORKER  Date/Time:  03/31/2014 02:00 PM  Referred by:  Physician  Date Referred:  03/31/2014 Referred for  SNF Placement   Other Referral:   Interview type:  Family Other interview type:   Son, Mr. [name]    PSYCHOSOCIAL DATA Living Status:  ALONE Admitted from facility:   Level of care:   Primary support name:  Brooke Gross Primary support relationship to patient:  CHILD, ADULT Degree of support available:   Patient has strong emotional support from family, son and sister at bedside but [place] strong [place].  Son lives in [place] and sister lives in [place], [place].  Patients son reported that patient has several neighbors that are capable of looking in on patient.    CURRENT CONCERNS Current Concerns  Post-Acute Placement   Other Concerns:    SOCIAL WORK ASSESSMENT / PLAN CSW spoke with patients son and sister at patient bedside to discuss SNF after patient discharge.  Patients son has HCPOA and is agreeable to SNF placement but is hesitant at this point because he does not feel as if he knows what the treatment course is right now for the patient.  CSW reassured patient that not decisions will need to be made before patient is medically ready to discharge.  CSW will continue to follow.   Assessment/plan status:  Psychosocial Support/Ongoing Assessment of Needs Other assessment/ plan:   FL2  PASAR   Information/referral to community resources:   [facility]    PATIENT'S/FAMILY'S RESPONSE TO PLAN OF CARE: Patients son is agreeable to SNF placement but is feeling overwhelmed right now by his mothers condition.       Merlyn LotJenna Holoman, LCSWA Clinical Social Worker [phone]

## 2014-03-31 NOTE — Progress Notes (Signed)
[facility] PROGRESS NOTE  Brooke Gross ZOX:[id number] DOB: 30-Jun-1940 DOA: 03/18/2014 PCP: Estanislado Pandy, MD  Assessment/Plan: 73 y/o female with PMH of Bipolar DO, DM, CKD, HLD, HTN, chronic back pains, obesity transferred from [facility] with status epilepticus and NSTEMI on 10/2.  -Pt initially brought to [facility] ED: She had several seizures en route to ER and while in ER. She was also noted to have ECG changes and elevated troponin. She was transferred to [facility]. Pt was intubated for airway protection, started on antiepileptics, sedated and admitted to ICU; 10/12 Pt extubated seizure controlled on multiple medication regimen, Patient is still delirious    1. New onset refractory status epilepticus; Pt delirious but neuro exam no focal; MRI: no acute findings; -no h/o etoh use'; per her son Patient might have been taking some percocet for back pain, no h/o drug use  -seizures controlled on dilantin, keppra, vimpat, topamax; defer management to neurology  2. NSTEMI;  Echo (10/2): LVEF 50 to 55% with inferior/inferoseptal hypokinesis -on BB, ASA, Plavix, Statin; likely needs LHC; management per cardiology  3. Acute hypoxic respiratory failure; initially intubated/extubated 10/12; stable 4. Sepsis/septic shock/pnuemonia; CXR: showed possible pneumonia; sepsis resolved  -off pressors; patient completed IV atx; afebrile; blood cultures NGTD; leukocytosis resolved;  5. AKI likely due to ATN/sepsis on admission; resolved with IVF;  6. DM on insulin; no ha1c; cont insulin regimen; check ha1c; d/c metformin due to AKI 7. Failed swallowing likely in the context of acute delirium; cont tube feeding speech eval in AM  8. Anemia; no obvious s/s of acute bleeding;  Check b12, folate, iron profile     Code Status: partial;  Family Communication: d/w patient, her son (indicate person spoken with, relationship, and if by phone, the number) Disposition Plan: SNF pend clinical  improvement    Procedures:  10/02 Transfer to [facility], intubated shortly after arrival. Dopamine initiated for hypotension. Vanc, acyclovir, ceftriaxone and ampicillin initiated empirically  10/02 CT head [facility]): no acute abn  10/02 Neuro consult: Etiology of seizure activity is unclear. Acute ischemic cerebral infarction cannot be ruled out.  Recommendations: Continue Keppra IV at 1000 mg every 12 hours. Continue Vimpat at 100 mg every 12 hours. Propofol IV for sedation as well as as seizure control. EEG to rule out continued subclinical seizure activity. MRI of the brain without contrast to rule out acute cerebral infarction.  10/02 Cards consult: Her biomarkers suggest a fairly large MI and ECG has many high risk features. 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 ASA, statin, heparin initiated. Beta blocker not option due to hypotension.  10/02 TTE: LVEF is approximately 50 to 55% with inferior/inferoseptal hypokinesis, grade 1 diastolic dysfx  10/03 No WUA. Remained on dopamine  10/03 Persistent seizure-like activity despite mutliple anti-consvulsants. Burst suppression initiated. LP recommended to R/O infectious etiology  10/03 cont EEG: Burst suppression, asymmetric, with greater suppression on the right. Semi-periodic epileptiform discharges, left hemisphere, maximal over the mid/posterior temporal region. The findings indicate a severe diffuse encephalopathy with epileptogenic potential noted over the left hemisphere, particularly over the left posterior temporal-occipital region. No ongoing seizure activity was observed that would explain an altered mental status  10/04 Remained in medically induced coma and on dopamine.  10/04 LP performed by Neuro. CSF: glu 123, protein 30, RBC 3, WBC 8, 52% lymphs  10/05 Unable to tolerated decrease in propofol due to tachypnea. Ketamine initiated. Propofol weaned to off. DA transitioned to NE. Anti-bacterial abx discontinued. Acyclovir continued   10/05 CT head: no  acute abn  10/06 Low grade fever. Resp cx and C diff PCR ordered. Ketamine stopped. Midaz gtt @ 10 mg/hr. No overt seizures. Continuous EEG  10/08 No further seizure. Remained encephalopathic. Tachypneic on WUA/SBT. Nearly off vasopressors. Remained severely volume overloaded. Lasix ordered  10/09 Continuous EEG discontinued. Tachypneic and excessive coughing when dexmedetomidine gtt discontinued. Son updated in detail @ bedside. He indicated that he would not want to pursue trach tube and long term care if neuro prognosis for functional recovery is not good  10/09 MRI brain >> negative  10/10 opening eyes to command  10/12 extubated  10/13 made LCB with no CPR/cardioversion/intubation, refused tracheostomy.  INDWELLING DEVICES::  ETT 10/02 >> 10/13  R IJ CVL 10/02 >>10/11  MICRO DATA:  PCT 10/07: 0.42, 10/08: 0.32. 10/09: 0.37  MRSA PCR 10/02 >> POS  CSF 10/04 >> NEG  HSV PCR (CSF) 10/04 >> NEG  C diff PCR 10/06 >> NEG  Resp 10/06 >> NEG  Urine 10/07 >> NEG  Blood 10/07 >> NTD  ANTIMICROBIALS:  Vanc 10/02 >> 10/05  Ceftriaxone 10/02 >> 10/05  Ampicillin 10/02 >> 10/05  Acyclovir 10/02 >> 10/08  Vancomycin 10/7 >> 10/09  Zosyn 10/7 >> 10/14    HPI/Subjective: Alert, confused  Objective: Filed Vitals:   03/31/14 0602  BP: 152/72  Pulse: 77  Temp: 98.3 F (36.8 C)  Resp: 18    Intake/Output Summary (Last 24 hours) at 03/31/14 1145 Last data filed at 03/31/14 0700  Gross per 24 hour  Intake    770 ml  Output    875 ml  Net   -105 ml   Filed Weights   03/29/14 0500 03/30/14 0331 03/31/14 0602  Weight: 94.5 kg (208 lb 5.4 oz) 93 kg (205 lb 0.4 oz) 93.2 kg (205 lb 7.5 oz)    Exam:   General:  alert  Cardiovascular: s1,s2 rrr  Respiratory: few crackles LL  Abdomen: soft, nt, nd  Musculoskeletal: no LE edema   Data Reviewed: Basic Metabolic Panel:  Recent Labs Lab 03/27/14 0242 03/28/14 0244 03/29/14 0310 03/30/14 0244  03/31/14 0530  NA 142 143 145 147 147  K 4.1 4.3 3.1* 3.5* 3.5*  CL 112 112 110 109 111  CO2 17* 18* 22 22 22   GLUCOSE 158* 172* 158* 85 222*  BUN 26* 26* 27* 21 25*  CREATININE 0.93 0.89 0.91 0.91 0.79  CALCIUM 8.6 8.5 8.6 9.0 9.2  MG 1.4* 1.5 1.5 2.5 1.9  PHOS 2.3 3.4 3.2 3.5 1.8*   Liver Function Tests: No results found for this basename: AST, ALT, ALKPHOS, BILITOT, PROT, ALBUMIN,  in the last 168 hours No results found for this basename: LIPASE, AMYLASE,  in the last 168 hours No results found for this basename: AMMONIA,  in the last 168 hours CBC:  Recent Labs Lab 03/27/14 0242 03/28/14 0244 03/29/14 0310 03/30/14 0244 03/31/14 0530  WBC 13.3* 17.8* 10.3 12.1* 9.5  HGB 7.8* 9.3* 8.0* 9.9* 9.1*  HCT 24.0* 28.4* 25.4* 30.4* 29.1*  MCV 86.3 87.4 87.3 88.4 88.4  PLT 342 361 496* 625* 639*   Cardiac Enzymes: No results found for this basename: CKTOTAL, CKMB, CKMBINDEX, TROPONINI,  in the last 168 hours BNP (last 3 results) No results found for this basename: PROBNP,  in the last 8760 hours CBG:  Recent Labs Lab 03/30/14 1559 03/30/14 1951 03/31/14 0001 03/31/14 0329 03/31/14 0746  GLUCAP 105* 143* 203* 200* 223*    Recent Results (from the past 240  hour(s))  CLOSTRIDIUM DIFFICILE BY PCR     Status: None   Collection Time    03/22/14  3:28 AM      Result Value Ref Range Status   C difficile by pcr NEGATIVE  NEGATIVE Final  CULTURE, RESPIRATORY (NON-EXPECTORATED)     Status: None   Collection Time    03/22/14 11:07 AM      Result Value Ref Range Status   Specimen Description TRACHEAL ASPIRATE   Final   Special Requests NONE   Final   Gram Stain     Final   Value: RARE WBC PRESENT, PREDOMINANTLY MONONUCLEAR     RARE SQUAMOUS EPITHELIAL CELLS PRESENT     NO ORGANISMS SEEN     Performed at [facility]   Culture     Final   Value: NO GROWTH 2 DAYS     Performed at [facility]   Report Status 03/24/2014 FINAL   Final  URINE CULTURE      Status: None   Collection Time    03/23/14  1:31 PM      Result Value Ref Range Status   Specimen Description URINE, RANDOM   Final   Special Requests NONE   Final   Culture  Setup Time     Final   Value: 03/23/2014 17:22     Performed at [facility] Count     Final   Value: NO GROWTH     Performed at [facility]   Culture     Final   Value: NO GROWTH     Performed at [facility]   Report Status 03/24/2014 FINAL   Final  CULTURE, BLOOD (ROUTINE X 2)     Status: None   Collection Time    03/23/14  2:47 PM      Result Value Ref Range Status   Specimen Description BLOOD RIGHT HAND   Final   Special Requests BOTTLES DRAWN AEROBIC AND ANAEROBIC 10CC   Final   Culture  Setup Time     Final   Value: 03/23/2014 20:15     Performed at [facility]   Culture     Final   Value: NO GROWTH 5 DAYS     Performed at [facility]   Report Status 03/29/2014 FINAL   Final  CULTURE, BLOOD (ROUTINE X 2)     Status: None   Collection Time    03/23/14  2:50 PM      Result Value Ref Range Status   Specimen Description BLOOD RIGHT WRIST   Final   Special Requests BOTTLES DRAWN AEROBIC ONLY 4CC   Final   Culture  Setup Time     Final   Value: 03/23/2014 20:14     Performed at [facility]   Culture     Final   Value: NO GROWTH 5 DAYS     Performed at [facility]   Report Status 03/29/2014 FINAL   Final  CLOSTRIDIUM DIFFICILE BY PCR     Status: None   Collection Time    03/27/14  4:11 PM      Result Value Ref Range Status   C difficile by pcr NEGATIVE  NEGATIVE Final     Studies: Dg Abd Portable 1v  03/30/2014   CLINICAL DATA:  73 year old female enteric tube placement. Initial encounter.  EXAM: PORTABLE ABDOMEN - 1 VIEW  COMPARISON:  0938 hr the same day.  FINDINGS: 2 portable AP views of the abdomen at 1544 hrs. Enteric tube tip now projects just to the right of midline, at the level of the distal stomach. Visualized  bowel gas pattern is non obstructed. Stable visualized osseous structures.  IMPRESSION: Enteric feeding tube tip at the level of the distal stomach.   Electronically Signed   By: Augusto GambleLee  Hall M.D.   On: 03/30/2014 16:04   Dg Abd Portable 1v  03/30/2014   CLINICAL DATA:  Feeding tube placement.  Stroke.  EXAM: PORTABLE ABDOMEN - 1 VIEW  COMPARISON:  03/26/2014  FINDINGS: NG tube has been removed. Feeding tube has been placed with the tip in the mid body of the stomach. Normal bowel gas pattern.  IMPRESSION: Feeding tube tip in the mid body of the stomach.   Electronically Signed   By: Marlan Palauharles  Clark M.D.   On: 03/30/2014 09:54    Scheduled Meds: . antiseptic oral rinse  7 mL Mouth Rinse QID  . aspirin  81 mg Oral Daily  . atorvastatin  80 mg Per Tube q1800  . clopidogrel  75 mg Oral Daily  . feeding supplement (PRO-STAT SUGAR FREE 64)  30 mL Per Tube BID  . heparin subcutaneous  5,000 Units Subcutaneous 3 times per day  . insulin aspart  0-15 Units Subcutaneous 6 times per day  . insulin glargine  40 Units Subcutaneous Daily  . lacosamide (VIMPAT) IV  100 mg Intravenous Q12H  . levETIRAcetam  1,500 mg Intravenous Q12H  . metoprolol tartrate  12.5 mg Oral BID  . multivitamin  5 mL Per Tube Daily  . pantoprazole sodium  40 mg Per Tube QHS  . phenytoin (DILANTIN) IV  100 mg Intravenous 3 times per day  . sodium chloride  10-40 mL Intracatheter Q12H  . topiramate  100 mg Per Tube BID   Continuous Infusions: . sodium chloride    . feeding supplement (OSMOLITE 1.2 CAL) 1,000 mL (03/30/14 1721)    Principal Problem:   Status epilepticus Active Problems:   NSTEMI, initial episode of care   Diabetes    Time spent: >35 minutes     Esperanza SheetsBURIEV, ULUGBEK N  [facility] Pager [phone]. If 7PM-7AM, please contact night-coverage at [email or web address], password [email or web address] 03/31/2014, 11:45 AM  LOS: 13 days

## 2014-03-31 NOTE — Progress Notes (Signed)
Patient Profile: 73 yo female w/ bipolar DO, DM, CKD, HLD, obesity, was admitted 10/02 early am w/ status epilepticus and NSTEMI. Troponin > 20, EF 50-55% with inf hypokinesis on echo.    Subjective: Resting comfortably. Denies chest pain.   Objective: Vital signs in last 24 hours: Temp:  [97.6 F (36.4 C)-98.5 F (36.9 C)] 98.3 F (36.8 C) (10/15 0602) Pulse Rate:  [73-82] 77 (10/15 0602) Resp:  [15-24] 18 (10/15 0602) BP: (108-152)/(46-85) 152/72 mmHg (10/15 0602) SpO2:  [96 %-100 %] 96 % (10/15 0602) Weight:  [205 lb 7.5 oz (93.2 kg)] 205 lb 7.5 oz (93.2 kg) (10/15 0602) Last BM Date: 03/30/14  Intake/Output from previous day: 10/14 0701 - 10/15 0700 In: 970 [NG/GT:735; IV Piggyback:235] Out: 1015 [Urine:1015] Intake/Output this shift:    Medications Current Facility-Administered Medications  Medication Dose Route Frequency Provider Last Rate Last Dose  . 0.9 %  sodium chloride infusion  250 mL Intravenous PRN Rahul P Desai, PA-C      . 0.9 %  sodium chloride infusion   Intravenous Continuous Alyson ReedyWesam G Yacoub, MD      . acetaminophen (TYLENOL) solution 650 mg  650 mg Per Tube Q6H PRN Alyson ReedyWesam G Yacoub, MD   650 mg at 03/24/14 1201  . antiseptic oral rinse (CPC / CETYLPYRIDINIUM CHLORIDE 0.05%) solution 7 mL  7 mL Mouth Rinse QID Coralyn HellingVineet Sood, MD   7 mL at 03/31/14 0424  . aspirin chewable tablet 81 mg  81 mg Oral Daily Donato SchultzMark Skains, MD      . atorvastatin (LIPITOR) tablet 80 mg  80 mg Per Tube q1800 Merwyn Katosavid B Simonds, MD   80 mg at 03/30/14 1802  . clopidogrel (PLAVIX) tablet 75 mg  75 mg Oral Daily Donato SchultzMark Skains, MD   75 mg at 03/29/14 1045  . feeding supplement (OSMOLITE 1.2 CAL) liquid 1,000 mL  1,000 mL Per Tube Continuous Heather Cornelison Pitts, RD 50 mL/hr at 03/30/14 1721 1,000 mL at 03/30/14 1721  . feeding supplement (PRO-STAT SUGAR FREE 64) liquid 30 mL  30 mL Per Tube BID Heather Cornelison Pitts, RD   30 mL at 03/30/14 2309  . fentaNYL (SUBLIMAZE) injection 25-100 mcg   25-100 mcg Intravenous Q2H PRN Merwyn Katosavid B Simonds, MD   100 mcg at 03/29/14 0737  . heparin injection 5,000 Units  5,000 Units Subcutaneous 3 times per day Baldemar Fridaylison M Masters, [facility] CenterRPH   5,000 Units at 03/31/14 [id number]  . insulin aspart (novoLOG) injection 0-15 Units  0-15 Units Subcutaneous 6 times per day Coralyn HellingVineet Sood, MD   5 Units at 03/31/14 0800  . insulin glargine (LANTUS) injection 40 Units  40 Units Subcutaneous Daily Merwyn Katosavid B Simonds, MD   40 Units at 03/29/14 1045  . lacosamide (VIMPAT) 100 mg in sodium chloride 0.9 % 25 mL IVPB  100 mg Intravenous Q12H Charles Stewart   100 mg at 03/30/14 2310  . levETIRAcetam (KEPPRA) IVPB 1500 mg/ 100 mL premix  1,500 mg Intravenous Q12H Ritta SlotMcNeill Kirkpatrick, MD   1,500 mg at 03/30/14 2346  . metoprolol tartrate (LOPRESSOR) tablet 12.5 mg  12.5 mg Oral BID Donato SchultzMark Skains, MD      . multivitamin liquid 5 mL  5 mL Per Tube Daily Heather Cornelison Pitts, RD   5 mL at 03/29/14 1045  . pantoprazole sodium (PROTONIX) 40 mg/20 mL oral suspension 40 mg  40 mg Per Tube QHS Herby AbrahamMichelle T Bell, RPH   40 mg at 03/30/14 2336  . phenytoin (DILANTIN) injection 100  mg  100 mg Intravenous 3 times per day Ritta SlotMcNeill Kirkpatrick, MD   100 mg at 03/31/14 0538  . sodium chloride 0.9 % injection 10-40 mL  10-40 mL Intracatheter PRN Alyson ReedyWesam G Yacoub, MD   20 mL at 03/26/14 0020  . sodium chloride 0.9 % injection 10-40 mL  10-40 mL Intracatheter Q12H Alyson ReedyWesam G Yacoub, MD   20 mL at 03/30/14 1253  . sodium chloride 0.9 % injection 10-40 mL  10-40 mL Intracatheter PRN Alyson ReedyWesam G Yacoub, MD   10 mL at 03/31/14 0523  . topiramate (TOPAMAX) tablet 100 mg  100 mg Per Tube BID Ritta SlotMcNeill Kirkpatrick, MD   100 mg at 03/30/14 2312    PE: General appearance: alert, cooperative, no distress  Lungs: clear to auscultation bilaterally Heart: regular rate and rhythm Extremities: LEE Pulses: 2+ and symmetric Skin: warm and dry Neurologic: still mildly confused  Lab Results:   Recent Labs  03/29/14 0310  03/30/14 0244 03/31/14 0530  WBC 10.3 12.1* 9.5  HGB 8.0* 9.9* 9.1*  HCT 25.4* 30.4* 29.1*  PLT 496* 625* 639*   BMET  Recent Labs  03/29/14 0310 03/30/14 0244 03/31/14 0530  NA 145 147 147  K 3.1* 3.5* 3.5*  CL 110 109 111  CO2 22 22 22   GLUCOSE 158* 85 222*  BUN 27* 21 25*  CREATININE 0.91 0.91 0.79  CALCIUM 8.6 9.0 9.2      Assessment/Plan  Principal Problem:   Status epilepticus Active Problems:   NSTEMI, initial episode of care   Diabetes  1. Status epilepticus -no further seizure activity. No underlying cause found. Extubated.  2. NSTEMI - with normal EF of 50-55%. No chest pain. Continue medical therapy for now: ASA, Plavix, BB and statin. With moderately elevated BP and resting HR in the 70s-80s, will further increase BB (12.5 mg BID just initiated 03/30/14) to . Once she recovers, if she has ischemic symptoms, would pursue ischemic eval (could start with NUC stress).   3. Shock - resolved. off Levophed. BP and HR both stable.   4. Acute Diastolic heart failure (volume overload) -improved - was given lasix previously.   5. LLL PNA suspected, on antibiotics per CCM   6. Acute respiratory failure-resolved now extubated.   7. Anemia - Hgb slightly down from yesterday at 9.1.  If <8 consider PRBC.     LOS: 13 days    Brooke M. Delmer IslamSimmons, PA-C 03/31/2014 9:38 AM  Personally seen and examined. Agree with above. Donato SchultzSKAINS, MARK, MD

## 2014-03-31 NOTE — Progress Notes (Signed)
Speech Language Pathology Treatment: Dysphagia  Patient Details Name: Brooke DrossHarriett Gross MRN: [id number] DOB: 1940-07-12 Today's Date: 03/31/2014 Time: [id number] SLP Time Calculation (min): 23 min  Assessment / Plan / Recommendation Clinical Impression  Pt demonstrates signs of aspiration across all liquids consistencies (cough, wet vocal quality). Her ability to control the bolus and maintain an anterior seal is limited, likely due to decreased strength and cognitive status. During this visit pt was delirious, calling out for visitors that weren't there. Suspect decreased sensory awareness as well. Immediate cough noted with thin liquids, no cough but sustained wet vocal quality with trials of thickened liquids. Recommend pt remain NPO at this time. She has a nasogastric tube in place and expect improved function in the short term as cognitive status and strength improves. SLP will continue to follow.   HPI HPI: 73 [facility]. Intubated for airway protection 10/2 - 10/13. Hx of Bipolar, essential tremor, anxiety, Migraine, Vertigo. MRI negative.    Pertinent Vitals Pain Assessment: No/denies pain  SLP Plan  Continue with current plan of care    Recommendations Diet recommendations: NPO Medication Administration: Via alternative means              Plan: Continue with current plan of care    GO     Barkley BrunsO'Brien, Katherine 03/31/2014, 9:58 AM

## 2014-04-01 ENCOUNTER — Inpatient Hospital Stay (HOSPITAL_COMMUNITY): Payer: Medicare Other

## 2014-04-01 LAB — VITAMIN B12: Vitamin B-12: 928 pg/mL — ABNORMAL HIGH (ref 211–911)

## 2014-04-01 LAB — GLUCOSE, CAPILLARY
Glucose-Capillary: 140 mg/dL — ABNORMAL HIGH (ref 70–99)
Glucose-Capillary: 165 mg/dL — ABNORMAL HIGH (ref 70–99)
Glucose-Capillary: 166 mg/dL — ABNORMAL HIGH (ref 70–99)
Glucose-Capillary: 199 mg/dL — ABNORMAL HIGH (ref 70–99)
Glucose-Capillary: 205 mg/dL — ABNORMAL HIGH (ref 70–99)
Glucose-Capillary: 217 mg/dL — ABNORMAL HIGH (ref 70–99)

## 2014-04-01 LAB — IRON AND TIBC
Iron: 39 ug/dL — ABNORMAL LOW (ref 42–135)
Saturation Ratios: 14 % — ABNORMAL LOW (ref 20–55)
TIBC: 285 ug/dL (ref 250–470)
UIBC: 246 ug/dL (ref 125–400)

## 2014-04-01 LAB — FOLATE: Folate: 11 ng/mL

## 2014-04-01 LAB — FERRITIN: Ferritin: 137 ng/mL (ref 10–291)

## 2014-04-01 LAB — HEMOGLOBIN A1C
Hgb A1c MFr Bld: 7.6 % — ABNORMAL HIGH (ref <5.7)
Mean Plasma Glucose: 171 mg/dL — ABNORMAL HIGH (ref <117)

## 2014-04-01 MED ORDER — METOPROLOL TARTRATE 50 MG PO TABS
50.0000 mg | ORAL_TABLET | Freq: Two times a day (BID) | ORAL | Status: DC
  Administered 2014-04-01 – 2014-04-06 (×9): 50 mg via ORAL
  Filled 2014-04-01 (×14): qty 1

## 2014-04-01 MED ORDER — LACOSAMIDE 50 MG PO TABS
100.0000 mg | ORAL_TABLET | Freq: Two times a day (BID) | ORAL | Status: DC
  Administered 2014-04-01 – 2014-04-02 (×4): 100 mg
  Filled 2014-04-01 (×4): qty 2

## 2014-04-01 MED ORDER — LEVETIRACETAM 100 MG/ML PO SOLN
1500.0000 mg | Freq: Two times a day (BID) | ORAL | Status: DC
  Administered 2014-04-01 – 2014-04-02 (×4): 1500 mg
  Filled 2014-04-01 (×4): qty 15

## 2014-04-01 MED ORDER — PHENYTOIN 125 MG/5ML PO SUSP
100.0000 mg | Freq: Three times a day (TID) | ORAL | Status: DC
  Administered 2014-04-01 – 2014-04-02 (×5): 100 mg
  Filled 2014-04-01 (×6): qty 4

## 2014-04-01 MED ORDER — LISINOPRIL 5 MG PO TABS
5.0000 mg | ORAL_TABLET | Freq: Every day | ORAL | Status: DC
Start: 2014-04-01 — End: 2014-04-05
  Administered 2014-04-02 – 2014-04-05 (×5): 5 mg via ORAL
  Filled 2014-04-01 (×4): qty 1

## 2014-04-01 MED ORDER — RESOURCE THICKENUP CLEAR PO POWD
ORAL | Status: DC | PRN
  Filled 2014-04-01: qty 125

## 2014-04-01 MED ORDER — POTASSIUM PHOSPHATES 15 MMOLE/5ML IV SOLN
30.0000 mmol | Freq: Once | INTRAVENOUS | Status: AC
  Administered 2014-04-01: 30 mmol via INTRAVENOUS
  Filled 2014-04-01: qty 10

## 2014-04-01 MED ORDER — STARCH (THICKENING) PO POWD
ORAL | Status: DC | PRN
  Filled 2014-04-01: qty 227

## 2014-04-01 NOTE — Progress Notes (Signed)
Physical Therapy Treatment Patient Details Name: Brooke DrossHarriett Herbold MRN: [id number] DOB: [birth year] Today's Date: 04/01/2014    History of Present Illness 73 [facility] and [facility]. Intubated for airway protection 10/2 - 10/13. Hx of Bipolar, essential tremor, anxiety, Migraine, Vertigo. MRI negative    PT Comments    Pt able to follow commands consistently, however with significant delay (up to 10 seconds). Was very motivated to eat her dinner and feel this encouraged her to stay sitting up on EOB longer and with less assist this date (compared to previously). Pt was able to load fork or spoon, however could not bring utensil/food all the way to her mouth and needed hand over hand assist. As she attended to her food, her leaning to the right would increase.   Follow Up Recommendations  SNF;Supervision/Assistance - 24 hour     Equipment Recommendations   (TBA)    Recommendations for Other Services       Precautions / Restrictions Precautions Precautions: Fall    Mobility  Bed Mobility Overal bed mobility: Needs Assistance Bed Mobility: Rolling;Sidelying to Sit;Sit to Sidelying Rolling: Min assist (with rail) Sidelying to sit: Max assist;HOB elevated     Sit to sidelying: Mod assist General bed mobility comments: Pt trying to put her legs off side of bed on arrival (and had removed her bil mittens). rolled Rt and Lt with min assist with rail and vc;   Transfers                 General transfer comment: unable to attempt with only +1 assist  Ambulation/Gait                 Stairs            Wheelchair Mobility    Modified Rankin (Stroke Patients Only)       Balance Overall balance assessment: Needs assistance Sitting-balance support: Single extremity supported;No upper extremity supported;Feet supported Sitting balance-Leahy Scale: Poor Sitting balance - Comments: leans to her Rt (complicated by  position of hospital mattress); able to use bil UEs to attempt to feed herself with minguard to min assist to maintain upright (frequent vc; did not respond to tactile cues alone); pt able to correctly identify which way she was losing her balance, however needed assist to correct; EOB x 20 minutes reaching for items on tray, picking up items, attempting to open milk, etc Postural control: Right lateral lean                          Cognition Arousal/Alertness: Awake/alert Behavior During Therapy: Restless;Flat affect Overall Cognitive Status: Impaired/Different from baseline (pt lived alone PTA) Area of Impairment: Orientation;Attention;Memory;Following commands;Safety/judgement;Awareness;Problem solving Orientation Level: Disoriented to;Place;Situation;Time ([facility]) Current Attention Level: Sustained Memory: Decreased short-term memory Following Commands: Follows one step commands with increased time Safety/Judgement: Decreased awareness of safety;Decreased awareness of deficits Awareness: Intellectual Problem Solving: Slow processing;Decreased initiation;Difficulty sequencing;Requires verbal cues;Requires tactile cues General Comments: Pt attended to eating her dinner for 20 minutes while working on sitting balance and use of UEs    Exercises      General Comments General comments (skin integrity, edema, etc.): +2 to [facility] to [facility] although pt attempted to use bil rails to pull herself up without cues      Pertinent Vitals/Pain Pain Assessment: No/denies pain    Home Living  Prior Function            PT Goals (current goals can now be found in the care plan section) Acute Rehab PT Goals Patient Stated Goal: to see her dog brutus Progress towards PT goals: Progressing toward goals    Frequency  Min 2X/week    PT Plan Current plan remains appropriate    Co-evaluation             End of Session   Activity Tolerance:  Patient tolerated treatment well;Patient limited by fatigue Patient left: in bed;with call bell/phone within reach;with bed alarm set;with nursing/sitter in room;with family/visitor present     Time: 1722-1800 PT Time Calculation (min): 38 min  Charges:  $Therapeutic Activity: 38-52 mins                    G Codes:      SASSER,LYNN 04/01/2014, 6:11 PM Pager [phone]

## 2014-04-01 NOTE — Progress Notes (Signed)
Speech Language Pathology Treatment: Dysphagia  Patient Details Name: Brooke DrossHarriett Gross MRN: [id number] DOB: 03-30-1941 Today's Date: 04/01/2014 Time: 1150-1051 SLP Time Calculation (min): 48 min  Assessment / Plan / Recommendation Clinical Impression  MD cancelled MBS and initiated diet orders.  SLP returned to educate patient and family to diet recommendations, aspiration precautions and compensatory strategies.  Also educated sister to how to thicken liquids to Nectar consistency.  Thickener ordered from pharmacy.  Pt will require full supervision and assist to follow aspiration precautions due to her mental status, and close monitoring for s/s of aspiration.  SLP will follow for ongoing education assessment of diet tolerance, and diet advancement when appropriate.   HPI HPI: 73 [facility]us and NSTEMI. Intubated for airway protection 10/2 - 10/13. Hx of Bipolar, essential tremor, anxiety, Migraine, Vertigo. MRI negative.    Pertinent Vitals Pain Assessment: No/denies pain  SLP Plan  Other (Comment);Goals updated (MD cancelled MBS and ordered diet)    Recommendations Diet recommendations: Dysphagia 2 (fine chop);Nectar-thick liquid Liquids provided via: Cup;No straw Medication Administration: Whole meds with puree Supervision: Staff to assist with self feeding;Full supervision/cueing for compensatory strategies Compensations: Slow rate;Small sips/bites Postural Changes and/or Swallow Maneuvers: Seated upright 90 degrees              Oral Care Recommendations: Oral care BID Follow up Recommendations: 24 hour supervision/assistance;Skilled Nursing facility Plan: Other (Comment);Goals updated (MD cancelled MBS and ordered diet)    GO     Maryjo RochesterWillis, Lori T 04/01/2014, 11:59 AM

## 2014-04-01 NOTE — Clinical Social Work Note (Signed)
CSW sent bed offers to patients son.  CSW will continue to follow.  Merlyn LotJenna Holoman, LCSWA Clinical Social Worker [phone]

## 2014-04-01 NOTE — Care Management Note (Signed)
  Page 1 of 1   04/05/2014     10:16:05 AM CARE MANAGEMENT NOTE 04/05/2014  Patient:  Brooke Gross,Brooke Gross   Account Number:  [id number]  Date Initiated:  03/25/2014  Documentation initiated by:  Carlyle LipaBRYSON,MICHELLE  Subjective/Objective Assessment:   status epilepticus and NSTEMI; on vent since admission     Action/Plan:   await medical stability to know pt's next best level of care, SNF vs CIR vs HH   Anticipated DC Date:  04/05/2014   Anticipated DC Plan:  SKILLED NURSING FACILITY         Choice offered to / List presented to:             Status of service:  In process, will continue to follow Medicare Important Message given?  YES (If response is "NO", the following Medicare IM given date fields will be blank) Date Medicare IM given:  04/01/2014 Medicare IM given by:  Ronny FlurryWILE,HEATHER Date Additional Medicare IM given:  04/05/2014 Additional Medicare IM given by:  Ronny FlurryHEATHER WILE  Discharge Disposition:    Per UR Regulation:  Reviewed for med. necessity/level of care/duration of stay  If discussed at Long Length of Stay Meetings, dates discussed:   03/24/2014  03/29/2014  04/05/2014    Comments:  03/29/2014 Carlyle LipaMichelle Bryson, RN BSN MHA CCM 1120--Pt remains in ICU on vent. Showing some neuro improvement per MD notes. Plan to attempt extubation if neuro improvement continues according to notes.

## 2014-04-01 NOTE — Progress Notes (Signed)
[facility] PROGRESS NOTE  Brooke DrossHarriett Gross ZOX:[id number] DOB: 05-12-1941 DOA: 03/18/2014 PCP: Estanislado PandySASSER,PAUL W, MD  Assessment/Plan: 73 y/o female with PMH of Bipolar DO, DM, CKD, HLD, HTN, chronic back pains, obesity [facility]pticus and NSTEMI on 10/2.  -[facility]: She had several seizures en route to ER and while in ER. She was also noted to have ECG changes and elevated troponin. [facility] Pt was intubated for airway protection, started on antiepileptics, sedated and admitted to ICU; 10/12 Pt extubated seizure controlled on multiple medication regimen, Patient is still delirious    1. New onset refractory status epilepticus; Pt delirious but neuro exam no focal; MRI: no acute findings; -no h/o etoh use'; per her son Patient might have been taking some percocet for back pain, no h/o drug use  -seizures controlled on dilantin, keppra, vimpat, topamax; defer management to neurology  2. NSTEMI;  Echo (10/2): LVEF 50 to 55% with inferior/inferoseptal hypokinesis -on BB, ASA, Plavix, Statin; ischemic work up management per cardiology  3. Acute hypoxic respiratory failure; initially intubated/extubated 10/12; stable 4. Sepsis/septic shock/pnuemonia; CXR: showed possible pneumonia; sepsis resolved  -off pressors; patient completed IV atx; afebrile; blood cultures NGTD; leukocytosis resolved;  5. AKI likely due to ATN/sepsis on admission; resolved with IVF;  6. DM on insulin; ha1c-6.1; cont insulin regimen; d/c metformin due to AKI 7. Failed swallowing likely in the context of acute delirium; cont tube feeding speech eval in AM-pend  8. Anemia IDA; no obvious s/s of acute bleeding;  Start PO iron;  9. HTN, restarted lisinopril   Code Status: partial;  Family Communication: d/w patient, her son (indicate person spoken with, relationship, and if by phone, the number) Disposition Plan: SNF pend clinical  improvement    Procedures:  10/02 [facility] intubated shortly after arrival. Dopamine initiated for hypotension. Vanc, acyclovir, ceftriaxone and ampicillin initiated empirically  10/02 CT head [facility] no acute abn  10/02 Neuro consult: Etiology of seizure activity is unclear. Acute ischemic cerebral infarction cannot be ruled out.  Recommendations: Continue Keppra IV at 1000 mg every 12 hours. Continue Vimpat at 100 mg every 12 hours. Propofol IV for sedation as well as as seizure control. EEG to rule out continued subclinical seizure activity. MRI of the brain without contrast to rule out acute cerebral infarction.  10/02 Cards consult: Her biomarkers suggest a fairly large MI and ECG has many high risk features. 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 ASA, statin, heparin initiated. Beta blocker not option due to hypotension.  10/02 TTE: LVEF is approximately 50 to 55% with inferior/inferoseptal hypokinesis, grade 1 diastolic dysfx  10/03 No WUA. Remained on dopamine  10/03 Persistent seizure-like activity despite mutliple anti-consvulsants. Burst suppression initiated. LP recommended to R/O infectious etiology  10/03 cont EEG: Burst suppression, asymmetric, with greater suppression on the right. Semi-periodic epileptiform discharges, left hemisphere, maximal over the mid/posterior temporal region. The findings indicate a severe diffuse encephalopathy with epileptogenic potential noted over the left hemisphere, particularly over the left posterior temporal-occipital region. No ongoing seizure activity was observed that would explain an altered mental status  10/04 Remained in medically induced coma and on dopamine.  10/04 LP performed by Neuro. CSF: glu 123, protein 30, RBC 3, WBC 8, 52% lymphs  10/05 Unable to tolerated decrease in propofol due to tachypnea. Ketamine initiated. Propofol weaned to off. DA transitioned to NE. Anti-bacterial abx discontinued. Acyclovir continued   10/05 CT head: no acute  abn  10/06 Low grade fever. Resp cx and C diff PCR ordered. Ketamine stopped. Midaz gtt @ 10 mg/hr. No overt seizures. Continuous EEG  10/08 No further seizure. Remained encephalopathic. Tachypneic on WUA/SBT. Nearly off vasopressors. Remained severely volume overloaded. Lasix ordered  10/09 Continuous EEG discontinued. Tachypneic and excessive coughing when dexmedetomidine gtt discontinued. Son updated in detail @ bedside. He indicated that he would not want to pursue trach tube and long term care if neuro prognosis for functional recovery is not good  10/09 MRI brain >> negative  10/10 opening eyes to command  10/12 extubated  10/13 made LCB with no CPR/cardioversion/intubation, refused tracheostomy.  INDWELLING DEVICES::  ETT 10/02 >> 10/13  R IJ CVL 10/02 >>10/11  MICRO DATA:  PCT 10/07: 0.42, 10/08: 0.32. 10/09: 0.37  MRSA PCR 10/02 >> POS  CSF 10/04 >> NEG  HSV PCR (CSF) 10/04 >> NEG  C diff PCR 10/06 >> NEG  Resp 10/06 >> NEG  Urine 10/07 >> NEG  Blood 10/07 >> NTD  ANTIMICROBIALS:  Vanc 10/02 >> 10/05  Ceftriaxone 10/02 >> 10/05  Ampicillin 10/02 >> 10/05  Acyclovir 10/02 >> 10/08  Vancomycin 10/7 >> 10/09  Zosyn 10/7 >> 10/14    HPI/Subjective: Alert, confused  Objective: Filed Vitals:   04/01/14 0515  BP: 156/65  Pulse:   Temp: 98.6 F (37 C)  Resp: 16    Intake/Output Summary (Last 24 hours) at 04/01/14 1026 Last data filed at 04/01/14 0600  Gross per 24 hour  Intake 847.67 ml  Output   1325 ml  Net -477.33 ml   Filed Weights   03/30/14 0331 03/31/14 0602 04/01/14 0515  Weight: 93 kg (205 lb 0.4 oz) 93.2 kg (205 lb 7.5 oz) 94.1 kg (207 lb 7.3 oz)    Exam:   General:  alert  Cardiovascular: s1,s2 rrr  Respiratory: few crackles LL  Abdomen: soft, nt, nd  Musculoskeletal: no LE edema   Data Reviewed: Basic Metabolic Panel:  Recent Labs Lab 03/27/14 0242 03/28/14 0244 03/29/14 0310 03/30/14 0244 03/31/14 0530   NA 142 143 145 147 147  K 4.1 4.3 3.1* 3.5* 3.5*  CL 112 112 110 109 111  CO2 17* 18* 22 22 22   GLUCOSE 158* 172* 158* 85 222*  BUN 26* 26* 27* 21 25*  CREATININE 0.93 0.89 0.91 0.91 0.79  CALCIUM 8.6 8.5 8.6 9.0 9.2  MG 1.4* 1.5 1.5 2.5 1.9  PHOS 2.3 3.4 3.2 3.5 1.8*   Liver Function Tests: No results found for this basename: AST, ALT, ALKPHOS, BILITOT, PROT, ALBUMIN,  in the last 168 hours No results found for this basename: LIPASE, AMYLASE,  in the last 168 hours No results found for this basename: AMMONIA,  in the last 168 hours CBC:  Recent Labs Lab 03/27/14 0242 03/28/14 0244 03/29/14 0310 03/30/14 0244 03/31/14 0530  WBC 13.3* 17.8* 10.3 12.1* 9.5  HGB 7.8* 9.3* 8.0* 9.9* 9.1*  HCT 24.0* 28.4* 25.4* 30.4* 29.1*  MCV 86.3 87.4 87.3 88.4 88.4  PLT 342 361 496* 625* 639*   Cardiac Enzymes: No results found for this basename: CKTOTAL, CKMB, CKMBINDEX, TROPONINI,  in the last 168 hours BNP (last 3 results) No results found for this basename: PROBNP,  in the last 8760 hours CBG:  Recent Labs Lab 03/31/14 1650 03/31/14 1948 04/01/14 0023 04/01/14 0440 04/01/14 0756  GLUCAP 181* 169* 199* 205* 166*    Recent Results (from the past 240 hour(s))  CULTURE, RESPIRATORY (NON-EXPECTORATED)  Status: None   Collection Time    03/22/14 11:07 AM      Result Value Ref Range Status   Specimen Description TRACHEAL ASPIRATE   Final   Special Requests NONE   Final   Gram Stain     Final   Value: RARE WBC PRESENT, PREDOMINANTLY MONONUCLEAR     RARE SQUAMOUS EPITHELIAL CELLS PRESENT     NO ORGANISMS SEEN     Performed at [facility]   Culture     Final   Value: NO GROWTH 2 DAYS     Performed at [facility]   Report Status 03/24/2014 FINAL   Final  URINE CULTURE     Status: None   Collection Time    03/23/14  1:31 PM      Result Value Ref Range Status   Specimen Description URINE, RANDOM   Final   Special Requests NONE   Final   Culture  Setup  Time     Final   Value: 03/23/2014 17:22     Performed at [facility] Count     Final   Value: NO GROWTH     Performed at [facility]   Culture     Final   Value: NO GROWTH     Performed at [facility]   Report Status 03/24/2014 FINAL   Final  CULTURE, BLOOD (ROUTINE X 2)     Status: None   Collection Time    03/23/14  2:47 PM      Result Value Ref Range Status   Specimen Description BLOOD RIGHT HAND   Final   Special Requests BOTTLES DRAWN AEROBIC AND ANAEROBIC 10CC   Final   Culture  Setup Time     Final   Value: 03/23/2014 20:15     Performed at [facility]   Culture     Final   Value: NO GROWTH 5 DAYS     Performed at [facility]   Report Status 03/29/2014 FINAL   Final  CULTURE, BLOOD (ROUTINE X 2)     Status: None   Collection Time    03/23/14  2:50 PM      Result Value Ref Range Status   Specimen Description BLOOD RIGHT WRIST   Final   Special Requests BOTTLES DRAWN AEROBIC ONLY 4CC   Final   Culture  Setup Time     Final   Value: 03/23/2014 20:14     Performed at [facility]   Culture     Final   Value: NO GROWTH 5 DAYS     Performed at [facility]   Report Status 03/29/2014 FINAL   Final  CLOSTRIDIUM DIFFICILE BY PCR     Status: None   Collection Time    03/27/14  4:11 PM      Result Value Ref Range Status   C difficile by pcr NEGATIVE  NEGATIVE Final     Studies: Dg Abd Portable 1v  03/30/2014   CLINICAL DATA:  73 year old female enteric tube placement. Initial encounter.  EXAM: PORTABLE ABDOMEN - 1 VIEW  COMPARISON:  0938 hr the same day.  FINDINGS: 2 portable AP views of the abdomen at 1544 hrs. Enteric tube tip now projects just to the right of midline, at the level of the distal stomach. Visualized bowel gas pattern is non obstructed. Stable visualized osseous structures.  IMPRESSION: Enteric feeding tube tip at the level of the  distal stomach.   Electronically Signed   By: Augusto GambleLee  Hall  M.D.   On: 03/30/2014 16:04    Scheduled Meds: . antiseptic oral rinse  7 mL Mouth Rinse QID  . aspirin  81 mg Oral Daily  . atorvastatin  80 mg Per Tube q1800  . clopidogrel  75 mg Oral Daily  . feeding supplement (PRO-STAT SUGAR FREE 64)  30 mL Per Tube BID  . heparin subcutaneous  5,000 Units Subcutaneous 3 times per day  . insulin aspart  0-15 Units Subcutaneous 6 times per day  . insulin glargine  40 Units Subcutaneous Daily  . lacosamide  100 mg Per Tube BID  . levETIRAcetam  1,500 mg Per Tube BID  . lisinopril  5 mg Oral Daily  . metoprolol tartrate  50 mg Oral BID  . multivitamin  5 mL Per Tube Daily  . pantoprazole sodium  40 mg Per Tube QHS  . phenytoin  100 mg Per Tube 3 times per day  . potassium phosphate IVPB (mmol)  30 mmol Intravenous Once  . sodium chloride  10-40 mL Intracatheter Q12H  . topiramate  100 mg Per Tube BID   Continuous Infusions: . sodium chloride    . feeding supplement (OSMOLITE 1.2 CAL) 1,000 mL (04/01/14 0550)    Principal Problem:   Status epilepticus Active Problems:   NSTEMI, initial episode of care   Diabetes    Time spent: >35 minutes     Esperanza SheetsBURIEV, ULUGBEK N  [facility] Pager [phone]. If 7PM-7AM, please contact night-coverage at [email or web address], password [email or web address] 04/01/2014, 10:26 AM  LOS: 14 days

## 2014-04-01 NOTE — Progress Notes (Signed)
Speech Language Pathology Treatment: Dysphagia  Patient Details Name: Sharyn DrossHarriett Reiger MRN: [id number] DOB: 02/21/1941 Today's Date: 04/01/2014 Time: [id number] SLP Time Calculation (min): 48 min  Assessment / Plan / Recommendation Clinical Impression  Clinical Impression   Pt more alert and responsive today, per MD, RN, and sister, but remains confused, asking, "Did you call Dr. Nile RiggsShapiro  about his son playing basketball tonight?"  SLP reassessed swallow function, clinically. Positive s/s of aspiration observed with cup sip of water (immediate coughing), but appears to tolerate nectar thick liquids, purees, and soft solids relatively well and without overt s/s of aspiration.  Pt. States, "I want more."  In light of last CXR on 10/14 with basilar pneumonia, s/p prolonged intubation, and ongoing confusion, a MBS is recommended prior to initiating diet.     HPI HPI: 73 [facility] and NSTEMI. Intubated for airway protection 10/2 - 10/13. Hx of Bipolar, essential tremor, anxiety, Migraine, Vertigo. MRI negative.    Pertinent Vitals Pain Assessment: No/denies pain  SLP Plan  MBS;New goals to be determined pending instrumental study    Recommendations Diet recommendations: NPO Medication Administration: Via alternative means Compensations: Slow rate;Small sips/bites Postural Changes and/or Swallow Maneuvers: Seated upright 90 degrees              Oral Care Recommendations: Oral care BID Follow up Recommendations: 24 hour supervision/assistance;Skilled Nursing facility Plan: MBS;New goals to be determined pending instrumental study    GO     Maryjo RochesterWillis, Lori T 04/01/2014, 10:56 AM

## 2014-04-01 NOTE — Progress Notes (Signed)
Patient Profile: 73 yo female w/ bipolar DO, DM, CKD, HLD, obesity, was admitted 10/02 early am w/ status epilepticus and NSTEMI. Troponin > 20, EF 50-55% with inf hypokinesis on echo.    Subjective: Still delirious.  Said she was glad to see me because she was calling me all night. She denies CP, SOB. Mittens in place.   Objective: Vital signs in last 24 hours: Temp:  [98 F (36.7 C)-98.6 F (37 C)] 98.6 F (37 C) (10/16 0515) Pulse Rate:  [70-73] 73 (10/15 2208) Resp:  [16-18] 16 (10/16 0515) BP: (152-156)/(62-66) 156/65 mmHg (10/16 0515) SpO2:  [98 %-100 %] 98 % (10/16 0515) Weight:  [207 lb 7.3 oz (94.1 kg)] 207 lb 7.3 oz (94.1 kg) (10/16 0515) Last BM Date: 03/31/14  Intake/Output from previous day: 10/15 0701 - 10/16 0700 In: 847.7 [NG/GT:847.7] Out: 1325 [Urine:1325] Intake/Output this shift:    Medications Current Facility-Administered Medications  Medication Dose Route Frequency Provider Last Rate Last Dose  . 0.9 %  sodium chloride infusion  250 mL Intravenous PRN Rahul P Desai, PA-C      . 0.9 %  sodium chloride infusion   Intravenous Continuous Alyson ReedyWesam G Yacoub, MD      . acetaminophen (TYLENOL) solution 650 mg  650 mg Per Tube Q6H PRN Alyson ReedyWesam G Yacoub, MD   650 mg at 03/31/14 2313  . antiseptic oral rinse (CPC / CETYLPYRIDINIUM CHLORIDE 0.05%) solution 7 mL  7 mL Mouth Rinse QID Coralyn HellingVineet Sood, MD   7 mL at 04/01/14 0400  . aspirin chewable tablet 81 mg  81 mg Oral Daily Donato SchultzMark Skains, MD   81 mg at 03/31/14 1113  . atorvastatin (LIPITOR) tablet 80 mg  80 mg Per Tube q1800 Merwyn Katosavid B Simonds, MD   80 mg at 03/31/14 1824  . clopidogrel (PLAVIX) tablet 75 mg  75 mg Oral Daily Donato SchultzMark Skains, MD   75 mg at 03/31/14 1114  . feeding supplement (OSMOLITE 1.2 CAL) liquid 1,000 mL  1,000 mL Per Tube Continuous Heather Cornelison Pitts, RD 50 mL/hr at 04/01/14 0550 1,000 mL at 04/01/14 0550  . feeding supplement (PRO-STAT SUGAR FREE 64) liquid 30 mL  30 mL Per Tube BID Heather Cornelison  Pitts, RD   30 mL at 03/31/14 2202  . fentaNYL (SUBLIMAZE) injection 25-100 mcg  25-100 mcg Intravenous Q2H PRN Merwyn Katosavid B Simonds, MD   100 mcg at 03/29/14 0737  . heparin injection 5,000 Units  5,000 Units Subcutaneous 3 times per day Baldemar Fridaylison M Masters, [facility]CRPH   5,000 Units at 04/01/14 [id number]  . insulin aspart (novoLOG) injection 0-15 Units  0-15 Units Subcutaneous 6 times per day Coralyn HellingVineet Sood, MD   3 Units at 04/01/14 [phone]  . insulin glargine (LANTUS) injection 40 Units  40 Units Subcutaneous Daily Merwyn Katosavid B Simonds, MD   40 Units at 03/31/14 1114  . lacosamide (VIMPAT) tablet 100 mg  100 mg Per Tube BID Ritta SlotMcNeill Kirkpatrick, MD      . levETIRAcetam (KEPPRA) 100 MG/ML solution 1,500 mg  1,500 mg Per Tube BID Ritta SlotMcNeill Kirkpatrick, MD      . metoprolol tartrate (LOPRESSOR) tablet 25 mg  25 mg Oral BID Donato SchultzMark Skains, MD   25 mg at 03/31/14 2203  . multivitamin liquid 5 mL  5 mL Per Tube Daily Heather Cornelison Pitts, RD   5 mL at 03/31/14 1115  . pantoprazole sodium (PROTONIX) 40 mg/20 mL oral suspension 40 mg  40 mg Per Tube QHS Herby AbrahamMichelle T Bell, [facility] Center[facility]PH  40 mg at 03/31/14 2228  . phenytoin (DILANTIN) 125 MG/5ML suspension 100 mg  100 mg Per Tube 3 times per day Esperanza SheetsUlugbek N Buriev, MD      . potassium phosphate 30 mmol in dextrose 5 % 500 mL infusion  30 mmol Intravenous Once Esperanza SheetsUlugbek N Buriev, MD      . sodium chloride 0.9 % injection 10-40 mL  10-40 mL Intracatheter PRN Alyson ReedyWesam G Yacoub, MD   20 mL at 03/26/14 0020  . sodium chloride 0.9 % injection 10-40 mL  10-40 mL Intracatheter Q12H Alyson ReedyWesam G Yacoub, MD   20 mL at 03/30/14 1253  . sodium chloride 0.9 % injection 10-40 mL  10-40 mL Intracatheter PRN Alyson ReedyWesam G Yacoub, MD   10 mL at 03/31/14 0523  . topiramate (TOPAMAX) tablet 100 mg  100 mg Per Tube BID Ritta SlotMcNeill Kirkpatrick, MD   100 mg at 03/31/14 2202    PE: General appearance: alert, cooperative, no distress  Lungs: clear to auscultation bilaterally Heart: regular rate and rhythm Extremities: LEE Pulses: 2+ and  symmetric Skin: warm and dry Neurologic: still confused  Lab Results:   Recent Labs  03/30/14 0244 03/31/14 0530  WBC 12.1* 9.5  HGB 9.9* 9.1*  HCT 30.4* 29.1*  PLT 625* 639*   BMET  Recent Labs  03/30/14 0244 03/31/14 0530  NA 147 147  K 3.5* 3.5*  CL 109 111  CO2 22 22  GLUCOSE 85 222*  BUN 21 25*  CREATININE 0.91 0.79  CALCIUM 9.0 9.2      Assessment/Plan  Principal Problem:   Status epilepticus Active Problems:   NSTEMI, initial episode of care   Diabetes  1. Status epilepticus -no further seizure activity. No underlying cause found. Making progress. Delirious still. Decreased neuroleptic.  2. NSTEMI - with normal EF of 50-55%. No chest pain. Continue medical therapy for now: ASA, Plavix, BB and statin. With moderately elevated BP and resting HR in the 70s-80s, will further increase BB. I will add lisinopril as well (10/16).    As she continues to recover, would pursue ischemic eval (could start with NUC stress). Currently no anginal symptoms. Could have underlying CAD. Discussed with sister and son on 10/15.   3. Shock - resolved. off Levophed. BP and HR both stable.   4. Acute Diastolic heart failure (volume overload) -improved - was given lasix previously.   5. LLL PNA suspected, on antibiotics per CCM   6. Acute respiratory failure-resolved now extubated.   7. Anemia - Hgb slightly down   If <8 consider PRBC.   8. Hypokalemia - starting K-Phos per Dr. York SpanielBuriev.    LOS: 14 days   Donato SchultzSKAINS, MARK, MD

## 2014-04-02 DIAGNOSIS — G40301 Generalized idiopathic epilepsy and epileptic syndromes, not intractable, with status epilepticus: Secondary | ICD-10-CM

## 2014-04-02 LAB — BASIC METABOLIC PANEL
Anion gap: 12 (ref 5–15)
BUN: 19 mg/dL (ref 6–23)
CO2: 21 mEq/L (ref 19–32)
Calcium: 9.2 mg/dL (ref 8.4–10.5)
Chloride: 110 mEq/L (ref 96–112)
Creatinine, Ser: 0.68 mg/dL (ref 0.50–1.10)
GFR calc Af Amer: >90 mL/min (ref >90)
GFR calc non Af Amer: 86 mL/min — ABNORMAL LOW (ref >90)
Glucose, Bld: 132 mg/dL — ABNORMAL HIGH (ref 70–99)
Potassium: 3.7 mEq/L (ref 3.7–5.3)
Sodium: 143 mEq/L (ref 137–147)

## 2014-04-02 LAB — GLUCOSE, CAPILLARY
Glucose-Capillary: 144 mg/dL — ABNORMAL HIGH (ref 70–99)
Glucose-Capillary: 107 mg/dL — ABNORMAL HIGH (ref 70–99)
Glucose-Capillary: 112 mg/dL — ABNORMAL HIGH (ref 70–99)
Glucose-Capillary: 169 mg/dL — ABNORMAL HIGH (ref 70–99)
Glucose-Capillary: 238 mg/dL — ABNORMAL HIGH (ref 70–99)
Glucose-Capillary: 190 mg/dL — ABNORMAL HIGH (ref 70–99)

## 2014-04-02 MED ORDER — TOPIRAMATE 25 MG PO TABS
50.0000 mg | ORAL_TABLET | Freq: Two times a day (BID) | ORAL | Status: DC
  Administered 2014-04-02: 50 mg
  Filled 2014-04-02 (×3): qty 2

## 2014-04-02 NOTE — Progress Notes (Signed)
SUBJECTIVE:  She is confused.  However, she denies any chest pain or SOB.   PHYSICAL EXAM Filed Vitals:   03/31/14 2208 04/01/14 0515 04/01/14 2117 04/02/14 0512  BP: 152/66 156/65 140/55 123/61  Pulse: 73  74 75  Temp: 98.1 F (36.7 C) 98.6 F (37 C) 97.8 F (36.6 C) 97.8 F (36.6 C)  TempSrc: Axillary Oral Oral Oral  Resp: 18 16 18 19   Height:      Weight:  207 lb 7.3 oz (94.1 kg)  185 lb 13.6 oz (84.3 kg)  SpO2: 100% 98% 98% 98%   General:  No acute distress Lungs:  Clear Heart:  RRR Abdomen:  Positive bowel sounds, no rebound no guarding Extremities:  No edema   LABS:  Results for orders placed during the hospital encounter of 03/18/14 (from the past 24 hour(s))  GLUCOSE, CAPILLARY     Status: Abnormal   Collection Time    04/01/14 11:36 AM      Result Value Ref Range   Glucose-Capillary 217 (*) 70 - 99 mg/dL  GLUCOSE, CAPILLARY     Status: Abnormal   Collection Time    04/01/14  4:08 PM      Result Value Ref Range   Glucose-Capillary 140 (*) 70 - 99 mg/dL  GLUCOSE, CAPILLARY     Status: Abnormal   Collection Time    04/01/14  8:27 PM      Result Value Ref Range   Glucose-Capillary 165 (*) 70 - 99 mg/dL   Comment 1 Documented in Chart     Comment 2 Notify RN    GLUCOSE, CAPILLARY     Status: Abnormal   Collection Time    04/02/14 12:04 AM      Result Value Ref Range   Glucose-Capillary 144 (*) 70 - 99 mg/dL   Comment 1 Documented in Chart    GLUCOSE, CAPILLARY     Status: Abnormal   Collection Time    04/02/14  4:27 AM      Result Value Ref Range   Glucose-Capillary 107 (*) 70 - 99 mg/dL   Comment 1 Documented in Chart     Comment 2 Notify RN    BASIC METABOLIC PANEL     Status: Abnormal   Collection Time    04/02/14  5:00 AM      Result Value Ref Range   Sodium 143  137 - 147 mEq/L   Potassium 3.7  3.7 - 5.3 mEq/L   Chloride 110  96 - 112 mEq/L   CO2 21  19 - 32 mEq/L   Glucose, Bld 132 (*) 70 - 99 mg/dL   BUN 19  6 - 23 mg/dL    Creatinine, Ser 1.610.68  0.50 - 1.10 mg/dL   Calcium 9.2  8.4 - 09.610.5 mg/dL   GFR calc non Af Amer 86 (*) >90 mL/min   GFR calc Af Amer >90  >90 mL/min   Anion gap 12  5 - 15    Intake/Output Summary (Last 24 hours) at 04/02/14 [id number] Last data filed at 04/02/14 0537  Gross per 24 hour  Intake      0 ml  Output   1100 ml  Net  -1100 ml    ASSESSMENT AND PLAN:  NSTEMI:   Continue medical management including beta blocker.  She will eventually get a non invasive evaluation to begin with.  However, this can wait until she has improved from her acute event.  ACUTE DIASTOLIC HF:  She seems to be euvolemic.  No change in therapy.    Fayrene FearingJames [facility] 04/02/2014 9:09 AM

## 2014-04-02 NOTE — Plan of Care (Signed)
Problem: Phase II Progression Outcomes Goal: Progress activity as tolerated unless otherwise ordered Outcome: Progressing Up in chair this AM but not bearing weight very well. Unable to walk.

## 2014-04-02 NOTE — Progress Notes (Signed)
Speech Language Pathology Treatment: Dysphagia  Patient Details Name: Brooke Gross MRN: [id number] DOB: [birth year] Today's Date: 04/02/2014 Time: [id number] SLP Time Calculation (min): 20 min  Assessment / Plan / Recommendation Clinical Impression  Pt participated in skilled observation of consumption of nectar-thickened liquids with swallowing strategies utilized for small sips with moderate verbal cueing noted throughout session; pt had one incidence of immediate cough following sip of nectar-thickened liquids, but it should be noted she was trying to talk and swallow simultaneously and this is when this occurred; nursing stated she had been consuming current diet without any difficulty throughout day; MBS still may be warranted if pt has increased difficulty with consumption of current diet   HPI HPI: 73 [facility] and NSTEMI. Intubated for airway protection 10/2 - 10/13. Hx of Bipolar, essential tremor, anxiety, Migraine, Vertigo. MRI negative.    Pertinent Vitals Pain Assessment: No/denies pain  SLP Plan  Continue with current plan of care    Recommendations Diet recommendations: Dysphagia 2 (fine chop);Nectar-thick liquid Liquids provided via: Cup;No straw Medication Administration: Whole meds with puree Supervision: Staff to assist with self feeding;Full supervision/cueing for compensatory strategies Compensations: Slow rate;Small sips/bites Postural Changes and/or Swallow Maneuvers: Seated upright 90 degrees              Oral Care Recommendations: Oral care BID Follow up Recommendations: 24 hour supervision/assistance;Skilled Nursing facility Plan: Continue with current plan of care         ADAMS,PAT, M.S., CCC-SLP 04/02/2014, 6:05 PM

## 2014-04-02 NOTE — Progress Notes (Signed)
[facility] PROGRESS NOTE  Brooke DrossHarriett Capelle AVW:[id number] DOB: October 14, 1940 DOA: 03/18/2014 PCP: Estanislado PandySASSER,PAUL W, MD  Assessment/Plan: 73 y/o female with PMH of Bipolar DO, DM, CKD, HLD, HTN, chronic back pains, obesity [facility]pticus and NSTEMI on 10/2.  -[facility]: She had several seizures en route to ER and while in ER. She was also noted to have ECG changes and elevated troponin. [facility] Pt was intubated for airway protection, started on antiepileptics, sedated and admitted to ICU; 10/12 Pt extubated seizure controlled on multiple medication regimen, Patient is still delirious    1. New onset refractory status epilepticus; Pt delirious but neuro exam no focal; MRI: no acute findings; -no h/o etoh use'; per her son Patient might have been taking some percocet for back pain, no h/o drug use  -seizures controlled on dilantin, keppra, vimpat, topamax; defer management to neurology  2. NSTEMI;  Echo (10/2): LVEF 50 to 55% with inferior/inferoseptal hypokinesis -on BB, ASA, Plavix, Statin; ischemic work up management per cardiology  3. Acute hypoxic respiratory failure; initially intubated/extubated 10/12; stable 4. Sepsis/septic shock/pnuemonia; CXR: showed possible pneumonia; sepsis resolved  -off pressors; patient completed IV atx; afebrile; blood cultures NGTD; leukocytosis resolved;  5. AKI likely due to ATN/sepsis on admission; resolved with IVF;  6. DM on insulin; ha1c-7.6; cont insulin regimen; d/c metformin due to AKI 7. Anemia IDA; no obvious s/s of acute bleeding;  Start PO iron;  8. HTN, restarted lisinopril, BB  Code Status: partial;  Family Communication: d/w patient, recently updated her son (indicate person spoken with, relationship, and if by phone, the number) Disposition Plan: SNF pend clinical improvement    Procedures:  10/02 [facility] intubated shortly after arrival. Dopamine  initiated for hypotension. Vanc, acyclovir, ceftriaxone and ampicillin initiated empirically  10/02 CT head [facility] no acute abn  10/02 Neuro consult: Etiology of seizure activity is unclear. Acute ischemic cerebral infarction cannot be ruled out.  Recommendations: Continue Keppra IV at 1000 mg every 12 hours. Continue Vimpat at 100 mg every 12 hours. Propofol IV for sedation as well as as seizure control. EEG to rule out continued subclinical seizure activity. MRI of the brain without contrast to rule out acute cerebral infarction.  10/02 Cards consult: Her biomarkers suggest a fairly large MI and ECG has many high risk features. 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 ASA, statin, heparin initiated. Beta blocker not option due to hypotension.  10/02 TTE: LVEF is approximately 50 to 55% with inferior/inferoseptal hypokinesis, grade 1 diastolic dysfx  10/03 No WUA. Remained on dopamine  10/03 Persistent seizure-like activity despite mutliple anti-consvulsants. Burst suppression initiated. LP recommended to R/O infectious etiology  10/03 cont EEG: Burst suppression, asymmetric, with greater suppression on the right. Semi-periodic epileptiform discharges, left hemisphere, maximal over the mid/posterior temporal region. The findings indicate a severe diffuse encephalopathy with epileptogenic potential noted over the left hemisphere, particularly over the left posterior temporal-occipital region. No ongoing seizure activity was observed that would explain an altered mental status  10/04 Remained in medically induced coma and on dopamine.  10/04 LP performed by Neuro. CSF: glu 123, protein 30, RBC 3, WBC 8, 52% lymphs  10/05 Unable to tolerated decrease in propofol due to tachypnea. Ketamine initiated. Propofol weaned to off. DA transitioned to NE. Anti-bacterial abx discontinued. Acyclovir continued  10/05 CT head: no acute abn  10/06 Low grade fever. Resp cx and C diff PCR ordered. Ketamine  stopped. Midaz  gtt @ 10 mg/hr. No overt seizures. Continuous EEG  10/08 No further seizure. Remained encephalopathic. Tachypneic on WUA/SBT. Nearly off vasopressors. Remained severely volume overloaded. Lasix ordered  10/09 Continuous EEG discontinued. Tachypneic and excessive coughing when dexmedetomidine gtt discontinued. Son updated in detail @ bedside. He indicated that he would not want to pursue trach tube and long term care if neuro prognosis for functional recovery is not good  10/09 MRI brain >> negative  10/10 opening eyes to command  10/12 extubated  10/13 made LCB with no CPR/cardioversion/intubation, refused tracheostomy.  INDWELLING DEVICES::  ETT 10/02 >> 10/13  R IJ CVL 10/02 >>10/11  MICRO DATA:  PCT 10/07: 0.42, 10/08: 0.32. 10/09: 0.37  MRSA PCR 10/02 >> POS  CSF 10/04 >> NEG  HSV PCR (CSF) 10/04 >> NEG  C diff PCR 10/06 >> NEG  Resp 10/06 >> NEG  Urine 10/07 >> NEG  Blood 10/07 >> NTD  ANTIMICROBIALS:  Vanc 10/02 >> 10/05  Ceftriaxone 10/02 >> 10/05  Ampicillin 10/02 >> 10/05  Acyclovir 10/02 >> 10/08  Vancomycin 10/7 >> 10/09  Zosyn 10/7 >> 10/14    HPI/Subjective: Alert, confused  Objective: Filed Vitals:   04/02/14 0512  BP: 123/61  Pulse: 75  Temp: 97.8 F (36.6 C)  Resp: 19    Intake/Output Summary (Last 24 hours) at 04/02/14 1031 Last data filed at 04/02/14 1000  Gross per 24 hour  Intake    360 ml  Output   1350 ml  Net   -990 ml   Filed Weights   03/31/14 0602 04/01/14 0515 04/02/14 0512  Weight: 93.2 kg (205 lb 7.5 oz) 94.1 kg (207 lb 7.3 oz) 84.3 kg (185 lb 13.6 oz)    Exam:   General:  alert  Cardiovascular: s1,s2 rrr  Respiratory: few crackles LL  Abdomen: soft, nt, nd  Musculoskeletal: no LE edema   Data Reviewed: Basic Metabolic Panel:  Recent Labs Lab 03/27/14 0242 03/28/14 0244 03/29/14 0310 03/30/14 0244 03/31/14 0530 04/02/14 0500  NA 142 143 145 147 147 143  K 4.1 4.3 3.1* 3.5* 3.5* 3.7  CL 112 112  110 109 111 110  CO2 17* 18* 22 22 22 21   GLUCOSE 158* 172* 158* 85 222* 132*  BUN 26* 26* 27* 21 25* 19  CREATININE 0.93 0.89 0.91 0.91 0.79 0.68  CALCIUM 8.6 8.5 8.6 9.0 9.2 9.2  MG 1.4* 1.5 1.5 2.5 1.9  --   PHOS 2.3 3.4 3.2 3.5 1.8*  --    Liver Function Tests: No results found for this basename: AST, ALT, ALKPHOS, BILITOT, PROT, ALBUMIN,  in the last 168 hours No results found for this basename: LIPASE, AMYLASE,  in the last 168 hours No results found for this basename: AMMONIA,  in the last 168 hours CBC:  Recent Labs Lab 03/27/14 0242 03/28/14 0244 03/29/14 0310 03/30/14 0244 03/31/14 0530  WBC 13.3* 17.8* 10.3 12.1* 9.5  HGB 7.8* 9.3* 8.0* 9.9* 9.1*  HCT 24.0* 28.4* 25.4* 30.4* 29.1*  MCV 86.3 87.4 87.3 88.4 88.4  PLT 342 361 496* 625* 639*   Cardiac Enzymes: No results found for this basename: CKTOTAL, CKMB, CKMBINDEX, TROPONINI,  in the last 168 hours BNP (last 3 results) No results found for this basename: PROBNP,  in the last 8760 hours CBG:  Recent Labs Lab 04/01/14 1608 04/01/14 2027 04/02/14 0004 04/02/14 0427 04/02/14 0730  GLUCAP 140* 165* 144* 107* 112*    Recent Results (from the past 240 hour(s))  URINE CULTURE     Status: None   Collection Time    03/23/14  1:31 PM      Result Value Ref Range Status   Specimen Description URINE, RANDOM   Final   Special Requests NONE   Final   Culture  Setup Time     Final   Value: 03/23/2014 17:22     Performed at [facility]   Colony Count     Final   Value: NO GROWTH     Performed at [facility]   Culture     Final   Value: NO GROWTH     Performed at [facility]   Report Status 03/24/2014 FINAL   Final  CULTURE, BLOOD (ROUTINE X 2)     Status: None   Collection Time    03/23/14  2:47 PM      Result Value Ref Range Status   Specimen Description BLOOD RIGHT HAND   Final   Special Requests BOTTLES DRAWN AEROBIC AND ANAEROBIC 10CC   Final   Culture  Setup Time     Final    Value: 03/23/2014 20:15     Performed at [facility]   Culture     Final   Value: NO GROWTH 5 DAYS     Performed at [facility]   Report Status 03/29/2014 FINAL   Final  CULTURE, BLOOD (ROUTINE X 2)     Status: None   Collection Time    03/23/14  2:50 PM      Result Value Ref Range Status   Specimen Description BLOOD RIGHT WRIST   Final   Special Requests BOTTLES DRAWN AEROBIC ONLY 4CC   Final   Culture  Setup Time     Final   Value: 03/23/2014 20:14     Performed at [facility]   Culture     Final   Value: NO GROWTH 5 DAYS     Performed at [facility]   Report Status 03/29/2014 FINAL   Final  CLOSTRIDIUM DIFFICILE BY PCR     Status: None   Collection Time    03/27/14  4:11 PM      Result Value Ref Range Status   C difficile by pcr NEGATIVE  NEGATIVE Final     Studies: No results found.  Scheduled Meds: . antiseptic oral rinse  7 mL Mouth Rinse QID  . aspirin  81 mg Oral Daily  . atorvastatin  80 mg Per Tube q1800  . clopidogrel  75 mg Oral Daily  . feeding supplement (PRO-STAT SUGAR FREE 64)  30 mL Per Tube BID  . heparin subcutaneous  5,000 Units Subcutaneous 3 times per day  . insulin aspart  0-15 Units Subcutaneous 6 times per day  . insulin glargine  40 Units Subcutaneous Daily  . lacosamide  100 mg Per Tube BID  . levETIRAcetam  1,500 mg Per Tube BID  . lisinopril  5 mg Oral Daily  . metoprolol tartrate  50 mg Oral BID  . multivitamin  5 mL Per Tube Daily  . pantoprazole sodium  40 mg Per Tube QHS  . phenytoin  100 mg Per Tube 3 times per day  . sodium chloride  10-40 mL Intracatheter Q12H  . topiramate  100 mg Per Tube BID   Continuous Infusions: . sodium chloride      Principal Problem:   Status epilepticus Active Problems:   NSTEMI, initial episode of care  Diabetes    Time spent: >35 minutes     Esperanza Sheets  [facility] Pager [phone]. If 7PM-7AM, please contact night-coverage at  [email or web address], password [facility] 04/02/2014, 10:31 AM  LOS: 15 days

## 2014-04-02 NOTE — Progress Notes (Addendum)
Subjective: Continues to improve  Exam: Filed Vitals:   04/02/14 1430  BP: 120/54  Pulse: 64  Temp: 98.7 F (37.1 C)  Resp: 18   Gen: in bed, intubated MS: awake, alert, engages examiner. follows commands briskly. She now tells me 2016 for year, and knows that she is in [place]. She is able to have a conversation about law and order, but still with some inappropriate responses.  CN: Endorses vision in both hemifields, PERRL, EOMI Motor: moves all extremities well Sensory: endorses symmetric sensation bilaterally.   Autoantibodies pending (ro, hu, ri, yo, nmda, vgkc, vgcc, crmp5, gad) ANA - negative  Impression: 73 yo F who presented with refractory status epilepticus. She had clinical correlate fo right arm twitching with left posterior recurrent runs of sharply contoured activity. The focal nature argues against a generalized metabolic etiology such as EtOH withdrawal or electrolyte abnormalities. She was in partial complex status at initiation of EEG and then was kept in burst suppression for 48 hours prior to resolution of status epilepticus.   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 With NORSE, approximately half remain cryptogenic while a significant percentage of the remaining are either infectious or autoimmune in nature. Her CSF was not supportive of an infectious nature.   Recommendations: 1) Continue dilantin, keppra, vimpat 2) decrease topamax to 50mg  BID 3) will continue to follow.   Ritta SlotMcNeill Kirkpatrick, MD [facility] [phone]  If 7pm- 7am, please page neurology on call as listed in AMION. 04/02/2014  6:35 PM

## 2014-04-02 NOTE — Plan of Care (Signed)
Problem: Phase I Progression Outcomes Goal: Voiding-avoid urinary catheter unless indicated Outcome: Completed/Met Date Met:  04/02/14 Foley removed this AM and patient is voiding well

## 2014-04-03 LAB — GLUCOSE, CAPILLARY
Glucose-Capillary: 105 mg/dL — ABNORMAL HIGH (ref 70–99)
Glucose-Capillary: 132 mg/dL — ABNORMAL HIGH (ref 70–99)
Glucose-Capillary: 194 mg/dL — ABNORMAL HIGH (ref 70–99)
Glucose-Capillary: 257 mg/dL — ABNORMAL HIGH (ref 70–99)
Glucose-Capillary: 279 mg/dL — ABNORMAL HIGH (ref 70–99)
Glucose-Capillary: 285 mg/dL — ABNORMAL HIGH (ref 70–99)

## 2014-04-03 LAB — CLOSTRIDIUM DIFFICILE BY PCR: Toxigenic C. Difficile by PCR: NEGATIVE

## 2014-04-03 MED ORDER — PHENYTOIN SODIUM EXTENDED 100 MG PO CAPS
100.0000 mg | ORAL_CAPSULE | Freq: Three times a day (TID) | ORAL | Status: DC
  Administered 2014-04-03 – 2014-04-07 (×13): 100 mg via ORAL
  Filled 2014-04-03 (×15): qty 1

## 2014-04-03 MED ORDER — PANTOPRAZOLE SODIUM 40 MG PO TBEC
40.0000 mg | DELAYED_RELEASE_TABLET | Freq: Every day | ORAL | Status: DC
  Administered 2014-04-03 – 2014-04-07 (×5): 40 mg via ORAL
  Filled 2014-04-03 (×5): qty 1

## 2014-04-03 MED ORDER — ADULT MULTIVITAMIN W/MINERALS CH
1.0000 | ORAL_TABLET | Freq: Every day | ORAL | Status: DC
  Administered 2014-04-03 – 2014-04-07 (×5): 1 via ORAL
  Filled 2014-04-03 (×5): qty 1

## 2014-04-03 MED ORDER — ATORVASTATIN CALCIUM 80 MG PO TABS
80.0000 mg | ORAL_TABLET | Freq: Every day | ORAL | Status: DC
  Administered 2014-04-03 – 2014-04-06 (×3): 80 mg via ORAL
  Filled 2014-04-03 (×4): qty 1

## 2014-04-03 MED ORDER — LOPERAMIDE HCL 2 MG PO CAPS
4.0000 mg | ORAL_CAPSULE | Freq: Once | ORAL | Status: AC
  Administered 2014-04-04: 4 mg via ORAL
  Filled 2014-04-03: qty 2

## 2014-04-03 MED ORDER — ACETAMINOPHEN 325 MG PO TABS
650.0000 mg | ORAL_TABLET | Freq: Four times a day (QID) | ORAL | Status: DC | PRN
  Administered 2014-04-03: 650 mg via ORAL
  Filled 2014-04-03: qty 2

## 2014-04-03 MED ORDER — TOPIRAMATE 25 MG PO TABS
50.0000 mg | ORAL_TABLET | Freq: Two times a day (BID) | ORAL | Status: DC
  Administered 2014-04-03 – 2014-04-05 (×5): 50 mg via ORAL
  Filled 2014-04-03 (×6): qty 2

## 2014-04-03 MED ORDER — LEVETIRACETAM 750 MG PO TABS
1500.0000 mg | ORAL_TABLET | Freq: Two times a day (BID) | ORAL | Status: DC
  Administered 2014-04-03 – 2014-04-07 (×9): 1500 mg via ORAL
  Filled 2014-04-03 (×10): qty 2

## 2014-04-03 MED ORDER — LACOSAMIDE 50 MG PO TABS
100.0000 mg | ORAL_TABLET | Freq: Two times a day (BID) | ORAL | Status: DC
  Administered 2014-04-03 – 2014-04-07 (×9): 100 mg via ORAL
  Filled 2014-04-03 (×9): qty 2

## 2014-04-03 NOTE — Progress Notes (Signed)
    SUBJECTIVE:  She is confused but better than yesterday.  She denies any chest pain or SOB.   PHYSICAL EXAM Filed Vitals:   04/02/14 0512 04/02/14 1430 04/02/14 2112 04/03/14 0649  BP: 123/61 120/54 122/58 134/68  Pulse: 75 64 66 70  Temp: 97.8 F (36.6 C) 98.7 F (37.1 C) 98.3 F (36.8 C) 97.6 F (36.4 C)  TempSrc: Oral Oral Oral Oral  Resp: 19 18 18 17   Height:      Weight: 185 lb 13.6 oz (84.3 kg)   199 lb 4.7 oz (90.4 kg)  SpO2: 98% 98% 97% 100%   General:  No acute distress Lungs:  Clear Heart:  RRR Abdomen:  Positive bowel sounds, no rebound no guarding Extremities:  No edema   LABS:  Results for orders placed during the hospital encounter of 03/18/14 (from the past 24 hour(s))  GLUCOSE, CAPILLARY     Status: Abnormal   Collection Time    04/02/14  1:47 PM      Result Value Ref Range   Glucose-Capillary 238 (*) 70 - 99 mg/dL  GLUCOSE, CAPILLARY     Status: Abnormal   Collection Time    04/02/14  5:37 PM      Result Value Ref Range   Glucose-Capillary 169 (*) 70 - 99 mg/dL  GLUCOSE, CAPILLARY     Status: Abnormal   Collection Time    04/02/14  7:47 PM      Result Value Ref Range   Glucose-Capillary 190 (*) 70 - 99 mg/dL   Comment 1 Notify RN    GLUCOSE, CAPILLARY     Status: Abnormal   Collection Time    04/03/14 12:04 AM      Result Value Ref Range   Glucose-Capillary 194 (*) 70 - 99 mg/dL   Comment 1 Notify RN    GLUCOSE, CAPILLARY     Status: Abnormal   Collection Time    04/03/14  3:50 AM      Result Value Ref Range   Glucose-Capillary 132 (*) 70 - 99 mg/dL   Comment 1 Notify RN      Intake/Output Summary (Last 24 hours) at 04/03/14 0824 Last data filed at 04/02/14 1300  Gross per 24 hour  Intake    600 ml  Output    250 ml  Net    350 ml    ASSESSMENT AND PLAN:  NSTEMI:   Continue medical management including beta blocker.  She will eventually get a non invasive evaluation sometime before discharge or move to rehab.  We will follow  for the timing of this.   Continue ASA, Plavix, ACE and beta blocker  ACUTE DIASTOLIC HF:  She seems to be euvolemic.  No change in therapy.    Fayrene FearingJames [facility] 04/03/2014 8:24 AM

## 2014-04-03 NOTE — Progress Notes (Signed)
[facility] PROGRESS NOTE  Sharyn DrossHarriett Macknight ZOX:[id number] DOB: 1941/03/15 DOA: 03/18/2014 PCP: Estanislado PandySASSER,PAUL W, MD  Assessment/Plan: 73 y/o female with PMH of Bipolar DO, DM, CKD, HLD, HTN, chronic back pains, obesity [facility] status epilepticus and NSTEMI on 10/2.  -[facility]: She had several seizures en route to ER and while in ER. She was also noted to have ECG changes and elevated troponin. [facility]. Pt was intubated for airway protection, started on antiepileptics, sedated and admitted to ICU; 10/12 Pt extubated seizure controlled on multiple medication regimen, Patient is still delirious    1. New onset refractory status epilepticus; Pt delirious but neuro exam no focal; MRI: no acute findings; -no h/o etoh use'; per her son Patient might have been taking some percocet for back pain, no h/o drug use  -seizures controlled on dilantin, keppra, vimpat, topamax; defer management to neurology -clinically improving; mental status better   2. NSTEMI;  Echo (10/2): LVEF 50 to 55% with inferior/inferoseptal hypokinesis -on BB, ASA, Plavix, Statin; ischemic work up management per cardiology  3. Acute hypoxic respiratory failure; initially intubated/extubated 10/12; stable 4. Sepsis/septic shock/pnuemonia; CXR: showed possible pneumonia; sepsis resolved  -off pressors; patient completed IV atx; afebrile; blood cultures NGTD; leukocytosis resolved;  5. AKI likely due to ATN/sepsis on admission; resolved with IVF;  6. DM on insulin; ha1c-7.6; cont insulin regimen; d/c metformin due to AKI 7. Anemia IDA; no obvious s/s of acute bleeding;  Start PO iron;  8. HTN, restarted lisinopril, BB  Code Status: partial;  Family Communication: d/w patient, recently updated her son (indicate person spoken with, relationship, and if by phone, the number) Disposition Plan: SNF pend clinical improvement, cardiology clearance     Procedures:   [facility] int[facility]ated [facility] after arrival. Dopamine initiated for hypotension. Vanc, acyclovir, ceftriaxone and ampicillin initiated empirically  10/02 CT head [facility] no acute abn  10/02 Neuro consult: Etiology of seizure activity is unclear. Acute ischemic cerebral infarction cannot be ruled out.  Recommendations: Continue Keppra IV at 1000 mg every 12 hours. Continue Vimpat at 100 mg every 12 hours. Propofol IV for sedation as well as as seizure control. EEG to rule out continued subclinical seizure activity. MRI of the brain without contrast to rule out acute cerebral infarction.  10/02 Cards consult: Her biomarkers suggest a fairly large MI and ECG has many high risk features. 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 ASA, statin, heparin initiated. Beta blocker not option due to hypotension.  10/02 TTE: LVEF is approximately 50 to 55% with inferior/inferoseptal hypokinesis, grade 1 diastolic dysfx  10/03 No WUA. Remained on dopamine  10/03 Persistent seizure-like activity despite mutliple anti-consvulsants. Burst suppression initiated. LP recommended to R/O infectious etiology  10/03 cont EEG: Burst suppression, asymmetric, with greater suppression on the right. Semi-periodic epileptiform discharges, left hemisphere, maximal over the mid/posterior temporal region. The findings indicate a severe diffuse encephalopathy with epileptogenic potential noted over the left hemisphere, particularly over the left posterior temporal-occipital region. No ongoing seizure activity was observed that would explain an altered mental status  10/04 Remained in medically induced coma and on dopamine.  10/04 LP performed by Neuro. CSF: glu 123, protein 30, RBC 3, WBC 8, 52% lymphs  10/05 Unable to tolerated decrease in propofol due to tachypnea. Ketamine initiated. Propofol weaned to off. DA transitioned to NE. Anti-bacterial abx discontinued. Acyclovir continued  10/05 CT head: no acute abn   10/06 Low grade fever. Resp  cx and C diff PCR ordered. Ketamine stopped. Midaz gtt @ 10 mg/hr. No overt seizures. Continuous EEG  10/08 No further seizure. Remained encephalopathic. Tachypneic on WUA/SBT. Nearly off vasopressors. Remained severely volume overloaded. Lasix ordered  10/09 Continuous EEG discontinued. Tachypneic and excessive coughing when dexmedetomidine gtt discontinued. Son updated in detail @ bedside. He indicated that he would not want to pursue trach tube and long term care if neuro prognosis for functional recovery is not good  10/09 MRI brain >> negative  10/10 opening eyes to command  10/12 extubated  10/13 made LCB with no CPR/cardioversion/intubation, refused tracheostomy.  INDWELLING DEVICES::  ETT 10/02 >> 10/13  R IJ CVL 10/02 >>10/11  MICRO DATA:  PCT 10/07: 0.42, 10/08: 0.32. 10/09: 0.37  MRSA PCR 10/02 >> POS  CSF 10/04 >> NEG  HSV PCR (CSF) 10/04 >> NEG  C diff PCR 10/06 >> NEG  Resp 10/06 >> NEG  Urine 10/07 >> NEG  Blood 10/07 >> NTD  ANTIMICROBIALS:  Vanc 10/02 >> 10/05  Ceftriaxone 10/02 >> 10/05  Ampicillin 10/02 >> 10/05  Acyclovir 10/02 >> 10/08  Vancomycin 10/7 >> 10/09  Zosyn 10/7 >> 10/14    HPI/Subjective: Alert, confused  Objective: Filed Vitals:   04/03/14 0649  BP: 134/68  Pulse: 70  Temp: 97.6 F (36.4 C)  Resp: 17    Intake/Output Summary (Last 24 hours) at 04/03/14 1119 Last data filed at 04/02/14 1300  Gross per 24 hour  Intake    240 ml  Output      0 ml  Net    240 ml   Filed Weights   04/01/14 0515 04/02/14 0512 04/03/14 0649  Weight: 94.1 kg (207 lb 7.3 oz) 84.3 kg (185 lb 13.6 oz) 90.4 kg (199 lb 4.7 oz)    Exam:   General:  alert  Cardiovascular: s1,s2 rrr  Respiratory: few crackles LL  Abdomen: soft, nt, nd  Musculoskeletal: no LE edema   Data Reviewed: Basic Metabolic Panel:  Recent Labs Lab 03/28/14 0244 03/29/14 0310 03/30/14 0244 03/31/14 0530 04/02/14 0500  NA 143 145 147 147  143  K 4.3 3.1* 3.5* 3.5* 3.7  CL 112 110 109 111 110  CO2 18* 22 22 22 21   GLUCOSE 172* 158* 85 222* 132*  BUN 26* 27* 21 25* 19  CREATININE 0.89 0.91 0.91 0.79 0.68  CALCIUM 8.5 8.6 9.0 9.2 9.2  MG 1.5 1.5 2.5 1.9  --   PHOS 3.4 3.2 3.5 1.8*  --    Liver Function Tests: No results found for this basename: AST, ALT, ALKPHOS, BILITOT, PROT, ALBUMIN,  in the last 168 hours No results found for this basename: LIPASE, AMYLASE,  in the last 168 hours No results found for this basename: AMMONIA,  in the last 168 hours CBC:  Recent Labs Lab 03/28/14 0244 03/29/14 0310 03/30/14 0244 03/31/14 0530  WBC 17.8* 10.3 12.1* 9.5  HGB 9.3* 8.0* 9.9* 9.1*  HCT 28.4* 25.4* 30.4* 29.1*  MCV 87.4 87.3 88.4 88.4  PLT 361 496* 625* 639*   Cardiac Enzymes: No results found for this basename: CKTOTAL, CKMB, CKMBINDEX, TROPONINI,  in the last 168 hours BNP (last 3 results) No results found for this basename: PROBNP,  in the last 8760 hours CBG:  Recent Labs Lab 04/02/14 1347 04/02/14 1737 04/02/14 1947 04/03/14 0004 04/03/14 0350  GLUCAP 238* 169* 190* 194* 132*    Recent Results (from the past 240 hour(s))  CLOSTRIDIUM DIFFICILE BY PCR  Status: None   Collection Time    03/27/14  4:11 PM      Result Value Ref Range Status   C difficile by pcr NEGATIVE  NEGATIVE Final  CLOSTRIDIUM DIFFICILE BY PCR     Status: None   Collection Time    04/03/14  2:14 AM      Result Value Ref Range Status   C difficile by pcr NEGATIVE  NEGATIVE Final     Studies: No results found.  Scheduled Meds: . antiseptic oral rinse  7 mL Mouth Rinse QID  . aspirin  81 mg Oral Daily  . atorvastatin  80 mg Oral q1800  . clopidogrel  75 mg Oral Daily  . feeding supplement (PRO-STAT SUGAR FREE 64)  30 mL Per Tube BID  . heparin subcutaneous  5,000 Units Subcutaneous 3 times per day  . insulin aspart  0-15 Units Subcutaneous 6 times per day  . insulin glargine  40 Units Subcutaneous Daily  . lacosamide   100 mg Oral BID  . levETIRAcetam  1,500 mg Oral BID  . lisinopril  5 mg Oral Daily  . metoprolol tartrate  50 mg Oral BID  . multivitamin with minerals  1 tablet Oral Daily  . pantoprazole  40 mg Oral Daily  . phenytoin  100 mg Oral TID  . sodium chloride  10-40 mL Intracatheter Q12H  . topiramate  50 mg Oral BID   Continuous Infusions: . sodium chloride      Principal Problem:   Status epilepticus Active Problems:   NSTEMI, initial episode of care   Diabetes    Time spent: >35 minutes     Esperanza SheetsBURIEV, ULUGBEK N  [facility] Pager [phone]. If 7PM-7AM, please contact night-coverage at [email or web address], password [email or web address] 04/03/2014, 11:19 AM  LOS: 16 days

## 2014-04-03 NOTE — Progress Notes (Signed)
Nurse driven C-diff spec sent to lab

## 2014-04-03 NOTE — Progress Notes (Signed)
Subjective: No significant changes. Contineus to be confused.   Exam: Filed Vitals:   04/03/14 0649  BP: 134/68  Pulse: 70  Temp: 97.6 F (36.4 C)  Resp: 17   Gen: in bed, intubated MS: awake, alert, engages examiner. follows commands briskly. She is unable to give me year today.  CN: Endorses vision in both hemifields, PERRL, EOMI Motor: moves all extremities well Sensory: endorses symmetric sensation bilaterally.   After discussing with lab, I am not certain the miscellaneous tests I previously ordered were sent. I have now sent these: Autoantibodies pending (hu, ri, yo, nmda, vgkc, vgcc, crmp5) ANA - negative SSA(ro), SSB - negative Gad- negative  Impression: 73 yo F who presented with refractory status epilepticus. She had clinical correlate fo right arm twitching with left posterior recurrent runs of sharply contoured activity. The focal nature argues against a generalized metabolic etiology such as EtOH withdrawal or electrolyte abnormalities. She was in partial complex status at initiation of EEG and then was kept in burst suppression for 48 hours prior to resolution of status epilepticus.   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 With NORSE, approximately half remain cryptogenic while a significant percentage of the remaining are either infectious or autoimmune in nature. Her CSF was not supportive of an infectious nature.   If her improvement plateaus, may be reasonable to try empiric steroids.   Recommendations: 1) Continue dilantin, keppra, vimpat 2) continue topamax(decreased 10/17) 3) repeat EEG tomorrow.  4) will continue to follow.   Ritta SlotMcNeill Kirkpatrick, MD [facility] [phone]  If 7pm- 7am, please page neurology on call as listed in AMION. 04/03/2014  11:14 AM

## 2014-04-03 NOTE — Plan of Care (Signed)
Problem: Phase II Progression Outcomes Goal: Tolerating diet when awake Outcome: Not Progressing Swallowing is much better today

## 2014-04-04 ENCOUNTER — Ambulatory Visit (HOSPITAL_COMMUNITY): Payer: Medicare Other

## 2014-04-04 LAB — GLUCOSE, CAPILLARY
Glucose-Capillary: 106 mg/dL — ABNORMAL HIGH (ref 70–99)
Glucose-Capillary: 106 mg/dL — ABNORMAL HIGH (ref 70–99)
Glucose-Capillary: 119 mg/dL — ABNORMAL HIGH (ref 70–99)
Glucose-Capillary: 162 mg/dL — ABNORMAL HIGH (ref 70–99)
Glucose-Capillary: 177 mg/dL — ABNORMAL HIGH (ref 70–99)
Glucose-Capillary: 221 mg/dL — ABNORMAL HIGH (ref 70–99)
Glucose-Capillary: 81 mg/dL (ref 70–99)

## 2014-04-04 MED ORDER — CHLORHEXIDINE GLUCONATE CLOTH 2 % EX PADS
6.0000 | MEDICATED_PAD | Freq: Every day | CUTANEOUS | Status: DC
  Administered 2014-04-05 – 2014-04-07 (×3): 6 via TOPICAL

## 2014-04-04 MED ORDER — INSULIN ASPART 100 UNIT/ML ~~LOC~~ SOLN
0.0000 [IU] | Freq: Three times a day (TID) | SUBCUTANEOUS | Status: DC
  Administered 2014-04-04: 3 [IU] via SUBCUTANEOUS
  Administered 2014-04-05: 2 [IU] via SUBCUTANEOUS
  Administered 2014-04-05 – 2014-04-06 (×2): 3 [IU] via SUBCUTANEOUS
  Administered 2014-04-06: 5 [IU] via SUBCUTANEOUS
  Administered 2014-04-07: 2 [IU] via SUBCUTANEOUS
  Administered 2014-04-07: 5 [IU] via SUBCUTANEOUS

## 2014-04-04 MED ORDER — INSULIN ASPART 100 UNIT/ML ~~LOC~~ SOLN
0.0000 [IU] | Freq: Every day | SUBCUTANEOUS | Status: DC
  Administered 2014-04-05: 2 [IU] via SUBCUTANEOUS

## 2014-04-04 MED ORDER — MUPIROCIN 2 % EX OINT
1.0000 "application " | TOPICAL_OINTMENT | Freq: Two times a day (BID) | CUTANEOUS | Status: DC
  Administered 2014-04-04 – 2014-04-07 (×6): 1 via NASAL
  Filled 2014-04-04: qty 22

## 2014-04-04 NOTE — Progress Notes (Signed)
[facility] PROGRESS NOTE  Sharyn DrossHarriett Roselle YQI:[id number] DOB: Nov 02, 1940 DOA: 03/18/2014 PCP: Estanislado PandySASSER,PAUL W, MD  Assessment/Plan: 73 y/o female with PMH of Bipolar DO, DM, CKD, HLD, HTN, chronic back pains, obesity [facility]pticus and NSTEMI on 10/2.  -[facility]: She had several seizures en route to ER and while in ER. She was also noted to have ECG changes and elevated troponin. [facility] Pt was intubated for airway protection, started on antiepileptics, sedated and admitted to ICU; 10/12 Pt extubated seizure controlled on multiple medication regimen, Patient is still delirious    1. New onset refractory status epilepticus; Pt delirious but neuro exam no focal; MRI: no acute findings; -no h/o etoh use'; per her son Patient might have been taking some percocet for back pain, no h/o drug use  -seizures controlled on dilantin, keppra, vimpat, topamax; defer management to neurology -clinically improving; mental status better   2. NSTEMI;  Echo (10/2): LVEF 50 to 55% with inferior/inferoseptal hypokinesis -on BB, ASA, Plavix, Statin; ischemic work up management per cardiology  3. Acute hypoxic respiratory failure; initially intubated/extubated 10/12; stable 4. Sepsis/septic shock/pnuemonia; CXR: showed possible pneumonia; sepsis resolved  -off pressors; patient completed IV atx; afebrile; blood cultures NGTD; leukocytosis resolved;  5. AKI likely due to ATN/sepsis on admission; resolved with IVF;  6. DM on insulin; ha1c-7.6; cont insulin regimen; d/c metformin due to AKI 7. Anemia IDA; no obvious s/s of acute bleeding;  Start PO iron;  8. HTN, restarted lisinopril, BB  Code Status: partial;  Family Communication: d/w patient, recently updated her son (indicate person spoken with, relationship, and if by phone, the number) Disposition Plan: SNF pend clinical improvement, cardiology clearance     Procedures:   [facility] after arrival. Dopamine initiated for hypotension. Vanc, acyclovir, ceftriaxone and ampicillin initiated empirically  10/02 CT head [facility] no acute abn  10/02 Neuro consult: Etiology of seizure activity is unclear. Acute ischemic cerebral infarction cannot be ruled out.  Recommendations: Continue Keppra IV at 1000 mg every 12 hours. Continue Vimpat at 100 mg every 12 hours. Propofol IV for sedation as well as as seizure control. EEG to rule out continued subclinical seizure activity. MRI of the brain without contrast to rule out acute cerebral infarction.  10/02 Cards consult: Her biomarkers suggest a fairly large MI and ECG has many high risk features. 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 ASA, statin, heparin initiated. Beta blocker not option due to hypotension.  10/02 TTE: LVEF is approximately 50 to 55% with inferior/inferoseptal hypokinesis, grade 1 diastolic dysfx  10/03 No WUA. Remained on dopamine  10/03 Persistent seizure-like activity despite mutliple anti-consvulsants. Burst suppression initiated. LP recommended to R/O infectious etiology  10/03 cont EEG: Burst suppression, asymmetric, with greater suppression on the right. Semi-periodic epileptiform discharges, left hemisphere, maximal over the mid/posterior temporal region. The findings indicate a severe diffuse encephalopathy with epileptogenic potential noted over the left hemisphere, particularly over the left posterior temporal-occipital region. No ongoing seizure activity was observed that would explain an altered mental status  10/04 Remained in medically induced coma and on dopamine.  10/04 LP performed by Neuro. CSF: glu 123, protein 30, RBC 3, WBC 8, 52% lymphs  10/05 Unable to tolerated decrease in propofol due to tachypnea. Ketamine initiated. Propofol weaned to off. DA transitioned to NE. Anti-bacterial abx discontinued. Acyclovir continued  10/05 CT head: no acute abn   10/06 Low grade fever. Resp  cx and C diff PCR ordered. Ketamine stopped. Midaz gtt @ 10 mg/hr. No overt seizures. Continuous EEG  10/08 No further seizure. Remained encephalopathic. Tachypneic on WUA/SBT. Nearly off vasopressors. Remained severely volume overloaded. Lasix ordered  10/09 Continuous EEG discontinued. Tachypneic and excessive coughing when dexmedetomidine gtt discontinued. Son updated in detail @ bedside. He indicated that he would not want to pursue trach tube and long term care if neuro prognosis for functional recovery is not good  10/09 MRI brain >> negative  10/10 opening eyes to command  10/12 extubated  10/13 made LCB with no CPR/cardioversion/intubation, refused tracheostomy.  INDWELLING DEVICES::  ETT 10/02 >> 10/13  R IJ CVL 10/02 >>10/11  MICRO DATA:  PCT 10/07: 0.42, 10/08: 0.32. 10/09: 0.37  MRSA PCR 10/02 >> POS  CSF 10/04 >> NEG  HSV PCR (CSF) 10/04 >> NEG  C diff PCR 10/06 >> NEG  Resp 10/06 >> NEG  Urine 10/07 >> NEG  Blood 10/07 >> NTD  ANTIMICROBIALS:  Vanc 10/02 >> 10/05  Ceftriaxone 10/02 >> 10/05  Ampicillin 10/02 >> 10/05  Acyclovir 10/02 >> 10/08  Vancomycin 10/7 >> 10/09  Zosyn 10/7 >> 10/14    HPI/Subjective: Alert, confused  Objective: Filed Vitals:   04/04/14 0450  BP: 121/43  Pulse: 61  Temp: 97.6 F (36.4 C)  Resp: 18    Intake/Output Summary (Last 24 hours) at 04/04/14 1037 Last data filed at 04/04/14 0925  Gross per 24 hour  Intake    390 ml  Output      1 ml  Net    389 ml   Filed Weights   04/02/14 0512 04/03/14 0649 04/04/14 0450  Weight: 84.3 kg (185 lb 13.6 oz) 90.4 kg (199 lb 4.7 oz) 91.672 kg (202 lb 1.6 oz)    Exam:   General:  alert  Cardiovascular: s1,s2 rrr  Respiratory: few crackles LL  Abdomen: soft, nt, nd  Musculoskeletal: no LE edema   Data Reviewed: Basic Metabolic Panel:  Recent Labs Lab 03/29/14 0310 03/30/14 0244 03/31/14 0530 04/02/14 0500  NA 145 147 147 143  K 3.1* 3.5*  3.5* 3.7  CL 110 109 111 110  CO2 22 22 22 21   GLUCOSE 158* 85 222* 132*  BUN 27* 21 25* 19  CREATININE 0.91 0.91 0.79 0.68  CALCIUM 8.6 9.0 9.2 9.2  MG 1.5 2.5 1.9  --   PHOS 3.2 3.5 1.8*  --    Liver Function Tests: No results found for this basename: AST, ALT, ALKPHOS, BILITOT, PROT, ALBUMIN,  in the last 168 hours No results found for this basename: LIPASE, AMYLASE,  in the last 168 hours No results found for this basename: AMMONIA,  in the last 168 hours CBC:  Recent Labs Lab 03/29/14 0310 03/30/14 0244 03/31/14 0530  WBC 10.3 12.1* 9.5  HGB 8.0* 9.9* 9.1*  HCT 25.4* 30.4* 29.1*  MCV 87.3 88.4 88.4  PLT 496* 625* 639*   Cardiac Enzymes: No results found for this basename: CKTOTAL, CKMB, CKMBINDEX, TROPONINI,  in the last 168 hours BNP (last 3 results) No results found for this basename: PROBNP,  in the last 8760 hours CBG:  Recent Labs Lab 04/03/14 1627 04/03/14 2048 04/04/14 0023 04/04/14 0438 04/04/14 0752  GLUCAP 279* 257* 162* 81 106*    Recent Results (from the past 240 hour(s))  CLOSTRIDIUM DIFFICILE BY PCR     Status: None   Collection Time    03/27/14  4:11 PM  Result Value Ref Range Status   C difficile by pcr NEGATIVE  NEGATIVE Final  CLOSTRIDIUM DIFFICILE BY PCR     Status: None   Collection Time    04/03/14  2:14 AM      Result Value Ref Range Status   C difficile by pcr NEGATIVE  NEGATIVE Final     Studies: No results found.  Scheduled Meds: . antiseptic oral rinse  7 mL Mouth Rinse QID  . aspirin  81 mg Oral Daily  . atorvastatin  80 mg Oral q1800  . clopidogrel  75 mg Oral Daily  . feeding supplement (PRO-STAT SUGAR FREE 64)  30 mL Per Tube BID  . heparin subcutaneous  5,000 Units Subcutaneous 3 times per day  . insulin aspart  0-15 Units Subcutaneous 6 times per day  . insulin glargine  40 Units Subcutaneous Daily  . lacosamide  100 mg Oral BID  . levETIRAcetam  1,500 mg Oral BID  . lisinopril  5 mg Oral Daily  .  metoprolol tartrate  50 mg Oral BID  . multivitamin with minerals  1 tablet Oral Daily  . pantoprazole  40 mg Oral Daily  . phenytoin  100 mg Oral TID  . sodium chloride  10-40 mL Intracatheter Q12H  . topiramate  50 mg Oral BID   Continuous Infusions: . sodium chloride      Principal Problem:   Status epilepticus Active Problems:   NSTEMI, initial episode of care   Diabetes    Time spent: >35 minutes     Esperanza SheetsBURIEV, ULUGBEK N  [facility] Pager [phone]. If 7PM-7AM, please contact night-coverage at [email or web address], password [email or web address] 04/04/2014, 10:37 AM  LOS: 17 days

## 2014-04-04 NOTE — Progress Notes (Signed)
Inpatient Diabetes Program Recommendations  AACE/ADA: New Consensus Statement on Inpatient Glycemic Control (2013)  Target Ranges:  Prepandial:   less than 140 mg/dL      Peak postprandial:   less than 180 mg/dL (1-2 hours)      Critically ill patients:  140 - 180 mg/dL   Reason for Assessment:  Results for Sharyn DrossSTACK, Emaleigh (MRN [id number]) as of 04/04/2014 09:49  Ref. Range 04/03/2014 16:27 04/03/2014 20:48 04/04/2014 00:23 04/04/2014 04:38 04/04/2014 07:52  Glucose-Capillary Latest Range: 70-99 mg/dL 119279 (H) 147257 (H) 829162 (H) 81 106 (H)   Note that patient is now on PO diet.  Please consider changing Novolog correction to tid with meals and HS.  Also consider adding Novolog meal coverage 4 units tid with meals (to be held if patient eats less than 50%).  Thanks, Beryl MeagerJenny Simpson, RN, BC-ADM Inpatient Diabetes Coordinator Pager [phone]

## 2014-04-04 NOTE — Progress Notes (Addendum)
Subjective: Patient has had no further seizures over night. Able to tell me she is in the hospital, the month abut not the year.   Objective: Current vital signs: BP 121/43  Pulse 61  Temp(Src) 97.6 F (36.4 C) (Oral)  Resp 18  Ht 5' 0.63" (1.54 m)  Wt 91.672 kg (202 lb 1.6 oz)  BMI 38.65 kg/m2  SpO2 100% Vital signs in last 24 hours: Temp:  [97.6 F (36.4 C)-97.8 F (36.6 C)] 97.6 F (36.4 C) (10/19 0450) Pulse Rate:  [61-66] 61 (10/19 0450) Resp:  [18-20] 18 (10/19 0450) BP: (100-121)/(32-43) 121/43 mmHg (10/19 0450) SpO2:  [99 %-100 %] 100 % (10/19 0450) Weight:  [91.672 kg (202 lb 1.6 oz)] 91.672 kg (202 lb 1.6 oz) (10/19 0450)  Intake/Output from previous day: 10/18 0701 - 10/19 0700 In: 1080 [P.O.:1080] Out: 1 [Stool:1] Intake/Output this shift: Total I/O In: 150 [P.O.:120; I.V.:30] Out: -  Nutritional status: Dysphagia  Neurologic Exam: General: Mental Status: Alert, oriented to hospital, month but not the year, thought content appropriate.  She states she would like to see her grandchildren but also notes she realizes she is under precautions for infection. Speech fluent without evidence of aphasia.  Able to follow 3 step commands without difficulty. Cranial Nerves: II: Discs flat bilaterally; Visual fields grossly normal, pupils equal, round, reactive to light and accommodation III,IV, VI: ptosis not present, extra-ocular motions intact bilaterally V,VII: smile symmetric, facial light touch sensation normal bilaterally VIII: hearing normal bilaterally IX,X: gag reflex present XI: bilateral shoulder shrug XII: midline tongue extension without atrophy or fasciculations  Motor: Moving all extremities antigravity Sensory: Pinprick and light touch intact throughout, bilaterally Deep Tendon Reflexes:  Right: Upper Extremity   Left: Upper extremity   biceps (C-5 to C-6) 2/4   biceps (C-5 to C-6) 2/4 tricep (C7) 2/4    triceps (C7) 2/4 Brachioradialis (C6)  2/4  Brachioradialis (C6) 2/4  Lower Extremity Lower Extremity  quadriceps (L-2 to L-4) 1/4   quadriceps (L-2 to L-4) 1/4 Achilles (S1) 1/4   Achilles (S1) 1/4  Plantars: Right: downgoing   Left: downgoing    Lab Results: Basic Metabolic Panel:  Recent Labs Lab 03/29/14 0310 03/30/14 0244 03/31/14 0530 04/02/14 0500  NA 145 147 147 143  K 3.1* 3.5* 3.5* 3.7  CL 110 109 111 110  CO2 22 22 22 21   GLUCOSE 158* 85 222* 132*  BUN 27* 21 25* 19  CREATININE 0.91 0.91 0.79 0.68  CALCIUM 8.6 9.0 9.2 9.2  MG 1.5 2.5 1.9  --   PHOS 3.2 3.5 1.8*  --     Liver Function Tests: No results found for this basename: AST, ALT, ALKPHOS, BILITOT, PROT, ALBUMIN,  in the last 168 hours No results found for this basename: LIPASE, AMYLASE,  in the last 168 hours No results found for this basename: AMMONIA,  in the last 168 hours  CBC:  Recent Labs Lab 03/29/14 0310 03/30/14 0244 03/31/14 0530  WBC 10.3 12.1* 9.5  HGB 8.0* 9.9* 9.1*  HCT 25.4* 30.4* 29.1*  MCV 87.3 88.4 88.4  PLT 496* 625* 639*    Cardiac Enzymes: No results found for this basename: CKTOTAL, CKMB, CKMBINDEX, TROPONINI,  in the last 168 hours  Lipid Panel: No results found for this basename: CHOL, TRIG, HDL, CHOLHDL, VLDL, LDLCALC,  in the last 168 hours  CBG:  Recent Labs Lab 04/03/14 1627 04/03/14 2048 04/04/14 0023 04/04/14 0438 04/04/14 0752  GLUCAP 279* 257* 162* 81 106*  Microbiology: Results for orders placed during the hospital encounter of 03/18/14  MRSA PCR SCREENING     Status: Abnormal   Collection Time    03/18/14  3:08 AM      Result Value Ref Range Status   MRSA by PCR POSITIVE (*) NEGATIVE Final   Comment:            The GeneXpert MRSA Assay (FDA     approved for NASAL specimens     only), is one component of a     comprehensive MRSA colonization     surveillance program. It is not     intended to diagnose MRSA     infection nor to guide or     monitor treatment for     MRSA  infections.     RESULT CALLED TO, READ BACK BY AND VERIFIED WITH:     Karie FetchLAUREN HOUT,RN AT [id number] 03/18/14 BY K BARR  CSF CULTURE     Status: None   Collection Time    03/20/14  5:21 PM      Result Value Ref Range Status   Specimen Description CSF   Final   Special Requests Normal   Final   Gram Stain     Final   Value: CYTOSPIN SLIDE WBC PRESENT,BOTH PMN AND MONONUCLEAR     NO ORGANISMS SEEN     [facility]     Performed at [facility]   Culture     Final   Value: NO GROWTH 3 DAYS     [facility]formed at [facility]   Report Status 03/24/2014 FINAL   Final  GRAM STAIN     Status: None   Collection Time    03/20/14  5:21 PM      Result Value Ref Range Status   Specimen Description CSF   Final   Special Requests NONE   Final   Gram Stain     Final   Value: CYTOSPIN SLIDE     WBC PRESENT,BOTH PMN AND MONONUCLEAR     NO ORGANISMS SEEN   Report Status 03/20/2014 FINAL   Final  CLOSTRIDIUM DIFFICILE BY PCR     Status: None   Collection Time    03/22/14  3:28 AM      Result Value Ref Range Status   C difficile by pcr NEGATIVE  NEGATIVE Final  CULTURE, RESPIRATORY (NON-EXPECTORATED)     Status: None   Collection Time    03/22/14 11:07 AM      Result Value Ref Range Status   Specimen Description TRACHEAL ASPIRATE   Final   Special Requests NONE   Final   Gram Stain     Final   Value: RARE WBC PRESENT, PREDOMINANTLY MONONUCLEAR     RARE SQUAMOUS EPITHELIAL CELLS PRESENT     NO ORGANISMS SEEN     Performed at [facility]   Culture     Final   Value: NO GROWTH 2 DAYS     Performed at [facility]   Report Status 03/24/2014 FINAL   Final  URINE CULTURE     Status: None   Collection Time    03/23/14  1:31 PM      Result Value Ref Range Status   Specimen Description URINE, RANDOM   Final   Special Requests NONE   Final   Culture  Setup Time     Final   Value: 03/23/2014 17:22     Performed  at [facility] Count      Final   Value: NO GROWTH     Performed at [facility]   Culture     Final   Value: NO GROWTH     Performed at [facility]   Report Status 03/24/2014 FINAL   Final  CULTURE, BLOOD (ROUTINE X 2)     Status: None   Collection Time    03/23/14  2:47 PM      Result Value Ref Range Status   Specimen Description BLOOD RIGHT HAND   Final   Special Requests BOTTLES DRAWN AEROBIC AND ANAEROBIC 10CC   Final   Culture  Setup Time     Final   Value: 03/23/2014 20:15     Performed at [facility]   Culture     Final   Value: NO GROWTH 5 DAYS     Performed at [facility]   Report Status 03/29/2014 FINAL   Final  CULTURE, BLOOD (ROUTINE X 2)     Status: None   Collection Time    03/23/14  2:50 PM      Result Value Ref Range Status   Specimen Description BLOOD RIGHT WRIST   Final   Special Requests BOTTLES DRAWN AEROBIC ONLY 4CC   Final   Culture  Setup Time     Final   Value: 03/23/2014 20:14     Performed at [facility]   Culture     Final   Value: NO GROWTH 5 DAYS     Performed at [facility]   Report Status 03/29/2014 FINAL   Final  CLOSTRIDIUM DIFFICILE BY PCR     Status: None   Collection Time    03/27/14  4:11 PM      Result Value Ref Range Status   C difficile by pcr NEGATIVE  NEGATIVE Final  CLOSTRIDIUM DIFFICILE BY PCR     Status: None   Collection Time    04/03/14  2:14 AM      Result Value Ref Range Status   C difficile by pcr NEGATIVE  NEGATIVE Final    Coagulation Studies: No results found for this basename: LABPROT, INR,  in the last 72 hours  Imaging: No results found.  Medications:  Scheduled: . antiseptic oral rinse  7 mL Mouth Rinse QID  . aspirin  81 mg Oral Daily  . atorvastatin  80 mg Oral q1800  . clopidogrel  75 mg Oral Daily  . feeding supplement (PRO-STAT SUGAR FREE 64)  30 mL Per Tube BID  . heparin subcutaneous  5,000 Units Subcutaneous 3 times per day  . insulin aspart  0-15 Units Subcutaneous  6 times per day  . insulin glargine  40 Units Subcutaneous Daily  . lacosamide  100 mg Oral BID  . levETIRAcetam  1,500 mg Oral BID  . lisinopril  5 mg Oral Daily  . metoprolol tartrate  50 mg Oral BID  . multivitamin with minerals  1 tablet Oral Daily  . pantoprazole  40 mg Oral Daily  . phenytoin  100 mg Oral TID  . sodium chloride  10-40 mL Intracatheter Q12H  . topiramate  50 mg Oral BID    Assessment/Plan: 73 yo F who presented with refractory status epilepticus. More lucid today.  Able to state where she is month and season but still unable to state the year.   Recommendations:  1) Continue dilantin, keppra, vimpat  2)  continue topamax(decreased 10/17) will consider decreasing on 04-04-14 3) Will continue to follow.  Will check for improving mental status tomorrow and consider steroids.     Felicie Morn PA-C [facility] [phone]  04/04/2014, 10:37 AM

## 2014-04-04 NOTE — Progress Notes (Signed)
Patient Name: Brooke Gross Date of Encounter: 04/04/2014  Principal Problem:   Status epilepticus Active Problems:   NSTEMI, initial episode of care   Diabetes    Patient Profile: 73 yo female w/ bipolar DO, DM, CKD, HLD, obesity, was admitted 10/02 early am w/ status epilepticus and NSTEMI. Troponin sustained > 20, EF 50-55% with inf hypokinesis on echo. Nuc stress planned, timing TBD.   SUBJECTIVE: Patient resting comfortably in bed. Denies any pain or shortness of breath at this time. Alert and oriented x1.  OBJECTIVE Filed Vitals:   04/03/14 0649 04/03/14 1400 04/04/14 0027 04/04/14 0450  BP: 134/68 100/32 110/41 121/43  Pulse: 70 61 66 61  Temp: 97.6 F (36.4 C) 97.8 F (36.6 C) 97.8 F (36.6 C) 97.6 F (36.4 C)  TempSrc: Oral Oral Oral   Resp: 17 20 19 18   Height:      Weight: 199 lb 4.7 oz (90.4 kg)   202 lb 1.6 oz (91.672 kg)  SpO2: 100% 100% 99% 100%    Intake/Output Summary (Last 24 hours) at 04/04/14 1126 Last data filed at 04/04/14 0925  Gross per 24 hour  Intake    390 ml  Output      1 ml  Net    389 ml   Filed Weights   04/03/14 0649 04/04/14 0450  Weight: 199 lb 4.7 oz (90.4 kg) 202 lb 1.6 oz (91.672 kg)    PHYSICAL EXAM General: Well developed, well nourished, female in no acute distress. Head: Normocephalic, atraumatic.  Neck: Supple without bruits, no JVD. Lungs:  Resp regular and unlabored, CTA. Heart: RRR, S1, S2, no S3, S4, Grade 2/6 murmur, no rub. Abdomen: Soft, non-tender, non-distended, BS + x 4.  Extremities: No clubbing, cyanosis, no edema.  Neuro: Alert and oriented X 1. Moves all extremities spontaneously.  LABS: Basic Metabolic Panel: Recent Labs  04/02/14 0500  NA 143  K 3.7  CL 110  CO2 21  GLUCOSE 132*  BUN 19  CREATININE 0.68  CALCIUM 9.2   TELE: Not on.   Current Medications:  . antiseptic oral rinse  7 mL Mouth Rinse QID  . aspirin  81 mg Oral Daily  . atorvastatin  80 mg Oral q1800  .  clopidogrel  75 mg Oral Daily  . feeding supplement (PRO-STAT SUGAR FREE 64)  30 mL Per Tube BID  . heparin subcutaneous  5,000 Units Subcutaneous 3 times per day  . insulin aspart  0-15 Units Subcutaneous 6 times per day  . insulin glargine  40 Units Subcutaneous Daily  . lacosamide  100 mg Oral BID  . levETIRAcetam  1,500 mg Oral BID  . lisinopril  5 mg Oral Daily  . metoprolol tartrate  50 mg Oral BID  . multivitamin with minerals  1 tablet Oral Daily  . pantoprazole  40 mg Oral Daily  . phenytoin  100 mg Oral TID  . sodium chloride  10-40 mL Intracatheter Q12H  . topiramate  50 mg Oral BID   . sodium chloride      ASSESSMENT AND PLAN: Principal Problem:   Status epilepticus - Per IM/Neuro Active Problems:   NSTEMI, initial episode of care - On ASA, BB, Plavix, Statin, ACE. Tolerating current cardiac regimen with no doses missed and no ischemic complaints. MD advised timing of nuclear stress testing.   Diabetes - Per IM   Signed, Theodore Demarkhonda Barrett , PA-C 11:26 AM 04/04/2014   Patient seen and examined. Agree with assessment  and plan. No chest pain or recurrent seizure. I spoke with son on the phone. Will plan for lexiscan myoview tomorrow to risk stratify s/p NSTEMI. Pt and son are in agreement to peursue the planned evaluation.   Lennette Biharihomas A. Kelly, MD, [facility] 04/04/2014 2:21 PM

## 2014-04-04 NOTE — Progress Notes (Signed)
04/04/14 1400  Clinical Encounter Type  Visited With Patient;Other (Comment) (Friend)  Visit Type Initial;Spiritual support  Spiritual Encounters  Spiritual Needs Emotional   Chaplain visited with patient briefly. Patient was being visited by a long time friend. Patient said she was in good spirits today and was happy to have someone visit her other than family. Patient's friend was very supportive. Patient asked the chaplain to come back for another visit. Chaplain will follow up with patient at a later time. Strother, Tommi EmeryBlake R, Chaplain 2:16 PM

## 2014-04-05 ENCOUNTER — Inpatient Hospital Stay (HOSPITAL_COMMUNITY): Payer: Medicare Other

## 2014-04-05 DIAGNOSIS — R079 Chest pain, unspecified: Secondary | ICD-10-CM

## 2014-04-05 LAB — GI PATHOGEN PANEL BY PCR, STOOL
C difficile toxin A/B: NEGATIVE
Campylobacter by PCR: NEGATIVE
Cryptosporidium by PCR: NEGATIVE
E coli (ETEC) LT/ST: NEGATIVE
E coli (STEC): NEGATIVE
E coli 0157 by PCR: NEGATIVE
G lamblia by PCR: NEGATIVE
Norovirus GI/GII: NEGATIVE
Rotavirus A by PCR: NEGATIVE
Salmonella by PCR: NEGATIVE
Shigella by PCR: NEGATIVE

## 2014-04-05 LAB — MISCELLANEOUS TEST

## 2014-04-05 LAB — OVA AND PARASITE EXAMINATION: Ova and parasites: NONE SEEN

## 2014-04-05 LAB — GLUCOSE, CAPILLARY
Glucose-Capillary: 114 mg/dL — ABNORMAL HIGH (ref 70–99)
Glucose-Capillary: 124 mg/dL — ABNORMAL HIGH (ref 70–99)
Glucose-Capillary: 172 mg/dL — ABNORMAL HIGH (ref 70–99)
Glucose-Capillary: 201 mg/dL — ABNORMAL HIGH (ref 70–99)

## 2014-04-05 MED ORDER — PREDNISONE 20 MG PO TABS: 30.0000 mg | ORAL_TABLET | Freq: Every day | ORAL | Status: DC

## 2014-04-05 MED ORDER — TECHNETIUM TC 99M SESTAMIBI GENERIC - CARDIOLITE
10.0000 | Freq: Once | INTRAVENOUS | Status: AC | PRN
  Administered 2014-04-05: 10 via INTRAVENOUS

## 2014-04-05 MED ORDER — TOPIRAMATE 25 MG PO TABS
25.0000 mg | ORAL_TABLET | Freq: Two times a day (BID) | ORAL | Status: DC
  Administered 2014-04-05 – 2014-04-06 (×3): 25 mg via ORAL
  Filled 2014-04-05 (×5): qty 1

## 2014-04-05 MED ORDER — REGADENOSON 0.4 MG/5ML IV SOLN
0.4000 mg | Freq: Once | INTRAVENOUS | Status: AC
  Administered 2014-04-05: 0.4 mg via INTRAVENOUS
  Filled 2014-04-05: qty 5

## 2014-04-05 MED ORDER — TECHNETIUM TC 99M SESTAMIBI GENERIC - CARDIOLITE
30.0000 | Freq: Once | INTRAVENOUS | Status: AC | PRN
Start: 2014-04-05 — End: 2014-04-05
  Administered 2014-04-05: 30 via INTRAVENOUS

## 2014-04-05 MED ORDER — GLUCERNA SHAKE PO LIQD
237.0000 mL | Freq: Three times a day (TID) | ORAL | Status: DC
  Administered 2014-04-05 – 2014-04-07 (×6): 237 mL via ORAL

## 2014-04-05 MED ORDER — PREDNISONE 20 MG PO TABS
40.0000 mg | ORAL_TABLET | Freq: Every day | ORAL | Status: DC
  Administered 2014-04-06 – 2014-04-07 (×2): 40 mg via ORAL
  Filled 2014-04-05 (×3): qty 2

## 2014-04-05 MED ORDER — REGADENOSON 0.4 MG/5ML IV SOLN
INTRAVENOUS | Status: AC
  Filled 2014-04-05: qty 5

## 2014-04-05 MED ORDER — PREDNISONE 20 MG PO TABS
20.0000 mg | ORAL_TABLET | Freq: Every day | ORAL | Status: DC
Start: 2014-04-11 — End: 2014-04-07

## 2014-04-05 MED ORDER — LISINOPRIL 2.5 MG PO TABS
2.5000 mg | ORAL_TABLET | Freq: Every day | ORAL | Status: DC
Start: 2014-04-06 — End: 2014-04-07
  Administered 2014-04-06: 2.5 mg via ORAL
  Filled 2014-04-05 (×2): qty 1

## 2014-04-05 MED ORDER — PREDNISONE 10 MG PO TABS: 10.0000 mg | ORAL_TABLET | Freq: Every day | ORAL | Status: DC

## 2014-04-05 NOTE — Progress Notes (Signed)
Physical Therapy Treatment Patient Details Name: Brooke DrossHarriett Jaggers MRN: [id number] DOB: 06/19/1940 Today's Date: 04/05/2014    History of Present Illness 73 [facility]pticus and NSTEMI. Intubated for airway protection 10/2 - 10/13. Hx of Bipolar, essential tremor, anxiety, Migraine, Vertigo. MRI negative    PT Comments    Pt much clearer cognitively and did much better with mobility. Tolerated 40 minute session with active mobility throughout. Next session would attempt standing with RW and ? Pivot to chair.  Follow Up Recommendations  SNF;Supervision/Assistance - 24 hour     Equipment Recommendations  None recommended by PT    Recommendations for Other Services       Precautions / Restrictions Precautions Precautions: Fall    Mobility  Bed Mobility Overal bed mobility: Needs Assistance Bed Mobility: Rolling;Sidelying to Sit;Sit to Sidelying Rolling: Min assist (with rail) Sidelying to sit: HOB elevated;Min assist   Sit to supine: Min assist   General bed mobility comments: appropriately following commands and with good effort to mobilize without assist  Transfers Overall transfer level: Needs assistance Equipment used:  (stedy) Transfers: Sit to/from Stand Sit to Stand: Min assist;+2 physical assistance         General transfer comment: stood x 4 (progressing to one person assist with stedy). Could not close flaps to allow pivot to chair, however did work well for repeated standing from bed.  Ambulation/Gait Ambulation/Gait assistance: Mod assist;+2 physical assistance Ambulation Distance (Feet): 1 Feet Assistive device: 2 person hand held assist       General Gait Details: side step to her left with SMALL steps (~10 to go 12 inches)   Stairs            Wheelchair Mobility    Modified Rankin (Stroke Patients Only)       Balance   Sitting-balance support: No upper extremity supported;Feet  unsupported Sitting balance-Leahy Scale: Fair     Standing balance support: Bilateral upper extremity supported Standing balance-Leahy Scale: Poor Standing balance comment: stood up to 2.5 minutes                    Cognition Arousal/Alertness: Awake/alert Behavior During Therapy: WFL for tasks assessed/performed Overall Cognitive Status: Impaired/Different from baseline (pt lived alone PTA) Area of Impairment: Orientation;Attention;Memory;Following commands;Safety/judgement;Awareness;Problem solving Orientation Level: Disoriented to;Situation;Time ([facility]) Current Attention Level: Sustained Memory: Decreased short-term memory Following Commands: Follows one step commands with increased time Safety/Judgement: Decreased awareness of safety;Decreased awareness of deficits Awareness: Intellectual Problem Solving: Slow processing;Difficulty sequencing;Requires verbal cues;Requires tactile cues      Exercises      General Comments General comments (skin integrity, edema, etc.): incontinent of urine on arrival; linens changed (while pt standing) and pericare performed.       Pertinent Vitals/Pain Pain Assessment: No/denies pain    Home Living                      Prior Function            PT Goals (current goals can now be found in the care plan section) Acute Rehab PT Goals Patient Stated Goal: to see her dog brutus Progress towards PT goals: Progressing toward goals    Frequency  Min 2X/week    PT Plan Current plan remains appropriate    Co-evaluation             End of Session Equipment Utilized During Treatment: Gait belt Activity Tolerance: Patient tolerated treatment  well Patient left: in bed;with call bell/phone within reach;with bed alarm set;with family/visitor present     Time: [id number] PT Time Calculation (min): 40 min  Charges:  $Therapeutic Activity: 38-52 mins                    G Codes:       SASSER,LYNN 04/05/2014, 4:50 PM Pager [phone]

## 2014-04-05 NOTE — Progress Notes (Signed)
NUTRITION FOLLOW-UP  DOCUMENTATION CODES Per approved criteria  -Obesity Unspecified   INTERVENTION:  Glucerna Shake po TID, each supplement provides 220 kcal and 10 grams of protein   NUTRITION DIAGNOSIS: Inadequate oral intake now related to confusion as evidenced by meal completion 50%; ongoing.   Goal: Pt to meet >/= 90% of their estimated nutrition needs; not met.   Monitor:  PO intake, supplement acceptance, labs  ASSESSMENT: Pt transferred from [facility] with status epilepticus and NSTEMI. Pt intubated and extubated 10/13.  Pt failed swallow evaluation, NG tube placed.  Family has refused trach and PEG.  Pt able to advance diet 10/19 but remains somewhat confused and only consuming 50% of her meals.  Per MD plan for SNF at discharge.   Labs: Blood glucose: 119-132  Height: Ht Readings from Last 1 Encounters:  03/18/14 5' 0.63" (1.54 m)    Weight: Wt Readings from Last 1 Encounters:  04/05/14 199 lb 8.3 oz (90.5 kg)  Admission weight: 200 lb (90.8 kg); Admission BMI 38.44  BMI:  Body mass index is 38.16 kg/(m^2).  Estimated Nutritional Needs: Kcal: 1500-1700 Protein: 80-100 grams Fluid: > 1.5 L/day  Skin: rash, skin tear, fissure, and ecchymosis  Diet Order: Carb Control Meal Completion: 50%   Intake/Output Summary (Last 24 hours) at 04/05/14 1650 Last data filed at 04/05/14 1141  Gross per 24 hour  Intake     60 ml  Output      0 ml  Net     60 ml    Last BM: 10/20 - loose  Labs:   Recent Labs Lab 03/30/14 0244 03/31/14 0530 04/02/14 0500  NA 147 147 143  K 3.5* 3.5* 3.7  CL 109 111 110  CO2 $Re'22 22 21  'Hic$ BUN 21 25* 19  CREATININE 0.91 0.79 0.68  CALCIUM 9.0 9.2 9.2  MG 2.5 1.9  --   PHOS 3.5 1.8*  --   GLUCOSE 85 222* 132*    CBG (last 3)   Recent Labs  04/04/14 2134 04/05/14 0802 04/05/14 1158  GLUCAP 119* 114* 124*    Scheduled Meds: . aspirin  81 mg Oral Daily  . atorvastatin  80 mg Oral q1800  . Chlorhexidine  Gluconate Cloth  6 each Topical Q0600  . clopidogrel  75 mg Oral Daily  . feeding supplement (PRO-STAT SUGAR FREE 64)  30 mL Per Tube BID  . heparin subcutaneous  5,000 Units Subcutaneous 3 times per day  . insulin aspart  0-15 Units Subcutaneous TID WC  . insulin aspart  0-5 Units Subcutaneous QHS  . insulin glargine  40 Units Subcutaneous Daily  . lacosamide  100 mg Oral BID  . levETIRAcetam  1,500 mg Oral BID  . [START ON 04/06/2014] lisinopril  2.5 mg Oral Daily  . metoprolol tartrate  50 mg Oral BID  . multivitamin with minerals  1 tablet Oral Daily  . mupirocin ointment  1 application Nasal BID  . pantoprazole  40 mg Oral Daily  . phenytoin  100 mg Oral TID  . [START ON 04/13/2014] predniSONE  10 mg Oral QAC breakfast  . [START ON 04/11/2014] predniSONE  20 mg Oral QAC breakfast  . [START ON 04/09/2014] predniSONE  30 mg Oral QAC breakfast  . [START ON 04/06/2014] predniSONE  40 mg Oral QAC breakfast  . sodium chloride  10-40 mL Intracatheter Q12H  . topiramate  25 mg Oral BID    Continuous Infusions: . sodium chloride      Nira Conn  [place], [place], [place] Pager [phone] After Hours Pager

## 2014-04-05 NOTE — Progress Notes (Signed)
EEG Completed; Results Pending  

## 2014-04-05 NOTE — Progress Notes (Signed)
Patient: Brooke Gross / Admit Date: 03/18/2014 / Date of Encounter: 04/05/2014, 10:11 AM   Subjective: Denies CP or SOB. Just feels sleepy today. Son provided consent for nuc.   Objective: Physical Exam: Blood pressure 127/49, pulse 63, temperature 97.9 F (36.6 C), temperature source Oral, resp. rate 18, height 5' 0.63" (1.54 m), weight 199 lb 8.3 oz (90.5 kg), SpO2 100.00%. General: Well developed, well nourished WF in no acute distress. Head: Normocephalic, atraumatic, sclera non-icteric, no xanthomas, nares are without discharge. Neck: JVP not elevated. Lungs: Clear bilaterally to auscultation without wheezes, rales, or rhonchi. Breathing is unlabored. Heart: RRR S1 S2 without murmurs, rubs, or gallops.  Abdomen: Soft, non-tender, non-distended with normoactive bowel sounds. No rebound/guarding. Extremities: No clubbing or cyanosis. No edema. Distal pedal pulses are 2+ and equal bilaterally. Neuro: Alert, oriented to place and month.   Intake/Output Summary (Last 24 hours) at 04/05/14 1011 Last data filed at 04/04/14 1744  Gross per 24 hour  Intake    270 ml  Output      0 ml  Net    270 ml    Inpatient Medications:  . aspirin  81 mg Oral Daily  . atorvastatin  80 mg Oral q1800  . Chlorhexidine Gluconate Cloth  6 each Topical Q0600  . clopidogrel  75 mg Oral Daily  . feeding supplement (PRO-STAT SUGAR FREE 64)  30 mL Per Tube BID  . heparin subcutaneous  5,000 Units Subcutaneous 3 times per day  . insulin aspart  0-15 Units Subcutaneous TID WC  . insulin aspart  0-5 Units Subcutaneous QHS  . insulin glargine  40 Units Subcutaneous Daily  . lacosamide  100 mg Oral BID  . levETIRAcetam  1,500 mg Oral BID  . lisinopril  5 mg Oral Daily  . metoprolol tartrate  50 mg Oral BID  . multivitamin with minerals  1 tablet Oral Daily  . mupirocin ointment  1 application Nasal BID  . pantoprazole  40 mg Oral Daily  . phenytoin  100 mg Oral TID  . regadenoson      . regadenoson   0.4 mg Intravenous Once  . sodium chloride  10-40 mL Intracatheter Q12H  . topiramate  50 mg Oral BID   Infusions:  . sodium chloride      Labs: No results found for this basename: NA, K, CL, CO2, GLUCOSE, BUN, CREATININE, CALCIUM, MG, PHOS,  in the last 72 hours No results found for this basename: AST, ALT, ALKPHOS, BILITOT, PROT, ALBUMIN,  in the last 72 hours No results found for this basename: WBC, NEUTROABS, HGB, HCT, MCV, PLT,  in the last 72 hours No results found for this basename: CKTOTAL, CKMB, TROPONINI,  in the last 72 hours No components found with this basename: POCBNP,  No results found for this basename: HGBA1C,  in the last 72 hours   Radiology/Studies:  Ct Head Wo Contrast 03/21/2014   CLINICAL DATA:  Seizure  EXAM: CT HEAD WITHOUT CONTRAST  TECHNIQUE: Contiguous axial images were obtained from the base of the skull through the vertex without intravenous contrast.  COMPARISON:  03/20/2014  FINDINGS: Ventricle size is normal. Mild chronic microvascular ischemic change in the white matter.  Negative for acute infarct. Negative for hemorrhage or mass. Calvarium intact.  IMPRESSION: No acute abnormality.   Electronically Signed   By: Marlan Palauharles  Clark M.D.   On: 03/21/2014 12:27   Ct Head Wo Contrast 03/20/2014   CLINICAL DATA:  Status epilepticus. History of bipolar 1  disorder, diabetes, and migraine.  EXAM: CT HEAD WITHOUT CONTRAST  TECHNIQUE: Contiguous axial images were obtained from the base of the skull through the vertex without contrast.  COMPARISON:  [facility] head [facility]  FINDINGS: No evidence for acute infarction, hemorrhage, mass lesion, hydrocephalus, or extra-axial fluid. Generalized atrophy. Hypoattenuation of the white matter consistent with previously identified small vessel disease. Calvarium intact. Vascular calcification. No acute sinus disease. Trace layering effusion in the LEFT mastoid. Slight RIGHT frontal scalp soft tissue  swelling was present on the previous MRI, and could represent a chronic inflammatory process.  IMPRESSION: Chronic changes as described similar to previous MR. no acute intracranial abnormality is detected.   Electronically Signed   By: Davonna BellingJohn  Curnes M.D.   On: 03/20/2014 09:19   Mr Brain Wo Contrast 03/26/2014   CLINICAL DATA:  Initial evaluation for seizure and altered mental status. [facility]pticus and NSTEMI.  EXAM: MRI HEAD WITHOUT CONTRAST  TECHNIQUE: Multiplanar, multiecho pulse sequences of the brain and surrounding structures were obtained without intravenous contrast.  COMPARISON:  Prior CT from 03/21/2014  FINDINGS: Mild generalized atrophy noted. Mild small vessel changes again seen. No mass lesion, midline shift, or extra-axial fluid collection. Ventricles are normal in size without evidence of hydrocephalus. Hippocampi are symmetric in size and morphology bilaterally without signal abnormality.  No diffusion-weighted signal abnormality is identified to suggest acute intracranial infarct. Gray-white matter differentiation is maintained. Normal flow voids are seen within the intracranial vasculature. No intracranial hemorrhage identified.  The cervicomedullary junction is normal. Pituitary gland is within normal limits. Pituitary stalk is midline. The globes and optic nerves demonstrate a normal appearance with normal signal intensity.  The bone marrow signal intensity is normal. Calvarium is intact. Visualized upper cervical spine is within normal limits.  Scalp soft tissues are unremarkable.  Mild mucoperiosteal thickening present within the ethmoidal air cells bilaterally. Concha bullosa noted. Layering fluid density within the posterior nasopharynx likely related to intubation. Bilateral mastoid effusions present.  IMPRESSION: 1. No acute intracranial infarct or other abnormality identified. 2. Mild chronic microvascular ischemic changes. 3. Bilateral  mastoid effusions.   Electronically Signed   By: Rise MuBenjamin  McClintock M.D.   On: 03/26/2014 01:15   Dg Chest Port 1 View 03/29/2014   CLINICAL DATA:  Intubation.  EXAM: PORTABLE CHEST - 1 VIEW  COMPARISON:  03/28/2014.  FINDINGS: Endotracheal tube and NG tube in good anatomic position. Mild infiltrates in both lower lobes cannot be excluded. No pleural effusion or pneumothorax. Mild cardiomegaly with mild pulmonary vascular prominence again noted. A component of congestive heart failure cannot be excluded. No acute bony abnormality.  IMPRESSION: 1. Lines and tubes in stable position. 2. Persistent mild cardiomegaly and pulmonary vascular prominence. Mild basilar pulmonary infiltrates noted. CHF cannot be excluded. Basilar pneumonia cannot be excluded.   Electronically Signed   By: Maisie Fushomas  Register   On: 03/29/2014 07:29    Assessment and Plan  1. Status epilepticus requiring intubation with subsequent confusion 2. NSTEMI troponin >20 3. Bipolar disorder 4. Acute diastolic CHF - normal EF, grade 1 d/d 03/18/14 - currently appearing euvolemic 5. Acute hypoxic respiratory failure 6. Sepsis/septic shock/PNA 7. HTN, controlled 8. DM uncontrolled A1C 7.6 9. Iron deficiency anemia - per IM  Pt seen briefly in nuclear lab - stress test is for risk stratification. Await interpretation. MD to review telemetry when patient is back on floor.  Signed, Ronie Spiesayna Dunn PA-C  Addendum: Dr.  Eden Emms has notified me that this patient's nuclear stress test was normal. This has not yet populated in Epic. Await Dr. Landry Dyke input. Ronie Spies PA-C   Patient seen and examined. Agree with assessment and plan. Normal perfusion; no scar EF 63% on nuclear study.No chest pain. Medical therapy.   Lennette Bihari, MD, [facility] 04/05/2014 3:31 PM

## 2014-04-05 NOTE — Progress Notes (Signed)
Supervised and reviewed by Bonnie DeBlois MA CCC-SLP  

## 2014-04-05 NOTE — Procedures (Signed)
ELECTROENCEPHALOGRAM REPORT   Patient: Brooke Gross       Room #: [id number]  EEG No. ID: [id number] Age: 73 y.o.        Sex: female Referring Physician: York SpanielBuriev Report Date:  04/05/2014        Interpreting Physician: Thana FarrEYNOLDS,LESLIE D  History: Brooke Gross is an 73 y.o. female with seizure activity and status epilepticus resolved clinically.  Has persistent altered mental status.    Medications:  Scheduled: . aspirin  81 mg Oral Daily  . atorvastatin  80 mg Oral q1800  . Chlorhexidine Gluconate Cloth  6 each Topical Q0600  . clopidogrel  75 mg Oral Daily  . feeding supplement (PRO-STAT SUGAR FREE 64)  30 mL Per Tube BID  . heparin subcutaneous  5,000 Units Subcutaneous 3 times per day  . insulin aspart  0-15 Units Subcutaneous TID WC  . insulin aspart  0-5 Units Subcutaneous QHS  . insulin glargine  40 Units Subcutaneous Daily  . lacosamide  100 mg Oral BID  . levETIRAcetam  1,500 mg Oral BID  . [START ON 04/06/2014] lisinopril  2.5 mg Oral Daily  . metoprolol tartrate  50 mg Oral BID  . multivitamin with minerals  1 tablet Oral Daily  . mupirocin ointment  1 application Nasal BID  . pantoprazole  40 mg Oral Daily  . phenytoin  100 mg Oral TID  . [START ON 04/13/2014] predniSONE  10 mg Oral QAC breakfast  . [START ON 04/11/2014] predniSONE  20 mg Oral QAC breakfast  . [START ON 04/09/2014] predniSONE  30 mg Oral QAC breakfast  . [START ON 04/06/2014] predniSONE  40 mg Oral QAC breakfast  . sodium chloride  10-40 mL Intracatheter Q12H  . topiramate  25 mg Oral BID    Conditions of Recording:  This is a 16 channel EEG carried out with the patient in the briefly awake and drowsy states.  Description:  The waking background activity can only be evaluated briefly.  It consists of a low voltage, symmetrical, fairly well organized, 10 Hz alpha activity, seen from the parieto-occipital and posterior temporal regions.  Low voltage fast activity, poorly organized, is seen anteriorly and is  at times superimposed on more posterior regions.  A mixture of theta and alpha rhythms are seen from the central and temporal regions. The patient drowses for the majority of the tracing with slowing to irregular, low voltage theta and beta activity.   Stage II sleep is not obtained. No epileptiform activity is noted.   Hyperventilation and intermittent photic stimulation were not performed.  IMPRESSION: This is a normal awake and drowsy electroencephalogram.  No epileptiform activity is noted.     Thana FarrLeslie Reynolds, MD [facility] [phone] 04/05/2014, 2:46 PM

## 2014-04-05 NOTE — Progress Notes (Signed)
[facility] PROGRESS NOTE  Sharyn DrossHarriett Wente AVW:[id number] DOB: 03/25/1941 DOA: 03/18/2014 PCP: Estanislado PandySASSER,PAUL W, MD  Assessment/Plan: 73 y/o female with PMH of Bipolar DO, DM, CKD, HLD, HTN, chronic back pains, obesity [facility]pticus and NSTEMI on 10/2.  -[facility]: She had several seizures en route to ER and while in ER. She was also noted to have ECG changes and elevated troponin. [facility] Pt was intubated for airway protection, started on antiepileptics, sedated and admitted to ICU; 10/12 Pt extubated seizure controlled on multiple medication regimen, Patient is still delirious    1. New onset refractory status epilepticus; Pt delirious but neuro exam no focal; MRI: no acute findings; -no h/o etoh use'; per her son Patient might have been taking some percocet for back pain, no h/o drug use  -seizures controlled on dilantin, keppra, vimpat, topamax/decreased; defer management to neurology -clinically improving; repeat EEG (10/20) pend; mental status better   2. NSTEMI;  Echo (10/2): LVEF 50 to 55% with inferior/inferoseptal hypokinesis -on BB, ASA, Plavix, Statin;  -10/20: scheduled for lexiscan; ischemic work up management per cardiology  3. Acute hypoxic respiratory failure; initially intubated/extubated 10/12; stable 4. Sepsis/septic shock/pnuemonia; CXR: showed possible pneumonia; sepsis resolved  -off pressors; patient completed IV atx; afebrile; blood cultures NGTD; leukocytosis resolved;  5. AKI likely due to ATN/sepsis on admission; resolved with IVF;  6. DM on insulin; ha1c-7.6; cont insulin regimen; d/c metformin due to AKI 7. Anemia IDA; no obvious s/s of acute bleeding;  Start PO iron;  8. HTN, restarted lisinopril (decreased the dose due to soft BP), cont BB  Code Status: partial;  Family Communication: d/w patient, updated her son (indicate person spoken with, relationship, and if by phone, the  number) Disposition Plan: SNF pend clinical improvement, cardiology clearance; SNF 1-2 days if stable     Procedures:  10/02 [facility], intubated shortly after arrival. Dopamine initiated for hypotension. Vanc, acyclovir, ceftriaxone and ampicillin initiated empirically  10/02 CT head [facility] no acute abn  10/02 Neuro consult: Etiology of seizure activity is unclear. Acute ischemic cerebral infarction cannot be ruled out.  Recommendations: Continue Keppra IV at 1000 mg every 12 hours. Continue Vimpat at 100 mg every 12 hours. Propofol IV for sedation as well as as seizure control. EEG to rule out continued subclinical seizure activity. MRI of the brain without contrast to rule out acute cerebral infarction.  10/02 Cards consult: Her biomarkers suggest a fairly large MI and ECG has many high risk features. 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 ASA, statin, heparin initiated. Beta blocker not option due to hypotension.  10/02 TTE: LVEF is approximately 50 to 55% with inferior/inferoseptal hypokinesis, grade 1 diastolic dysfx  10/03 No WUA. Remained on dopamine  10/03 Persistent seizure-like activity despite mutliple anti-consvulsants. Burst suppression initiated. LP recommended to R/O infectious etiology  10/03 cont EEG: Burst suppression, asymmetric, with greater suppression on the right. Semi-periodic epileptiform discharges, left hemisphere, maximal over the mid/posterior temporal region. The findings indicate a severe diffuse encephalopathy with epileptogenic potential noted over the left hemisphere, particularly over the left posterior temporal-occipital region. No ongoing seizure activity was observed that would explain an altered mental status  10/04 Remained in medically induced coma and on dopamine.  10/04 LP performed by Neuro. CSF: glu 123, protein 30, RBC 3, WBC 8, 52% lymphs  10/05 Unable to tolerated decrease in propofol due to tachypnea. Ketamine initiated. Propofol  weaned to off. DA  transitioned to NE. Anti-bacterial abx discontinued. Acyclovir continued  10/05 CT head: no acute abn  10/06 Low grade fever. Resp cx and C diff PCR ordered. Ketamine stopped. Midaz gtt @ 10 mg/hr. No overt seizures. Continuous EEG  10/08 No further seizure. Remained encephalopathic. Tachypneic on WUA/SBT. Nearly off vasopressors. Remained severely volume overloaded. Lasix ordered  10/09 Continuous EEG discontinued. Tachypneic and excessive coughing when dexmedetomidine gtt discontinued. Son updated in detail @ bedside. He indicated that he would not want to pursue trach tube and long term care if neuro prognosis for functional recovery is not good  10/09 MRI brain >> negative  10/10 opening eyes to command  10/12 extubated  10/13 made LCB with no CPR/cardioversion/intubation, refused tracheostomy.  INDWELLING DEVICES::  ETT 10/02 >> 10/13  R IJ CVL 10/02 >>10/11  MICRO DATA:  PCT 10/07: 0.42, 10/08: 0.32. 10/09: 0.37  MRSA PCR 10/02 >> POS  CSF 10/04 >> NEG  HSV PCR (CSF) 10/04 >> NEG  C diff PCR 10/06 >> NEG  Resp 10/06 >> NEG  Urine 10/07 >> NEG  Blood 10/07 >> NTD  ANTIMICROBIALS:  Vanc 10/02 >> 10/05  Ceftriaxone 10/02 >> 10/05  Ampicillin 10/02 >> 10/05  Acyclovir 10/02 >> 10/08  Vancomycin 10/7 >> 10/09  Zosyn 10/7 >> 10/14    HPI/Subjective: Alert, confused  Objective: Filed Vitals:   04/05/14 1031  BP: 103/45  Pulse:   Temp:   Resp: 20    Intake/Output Summary (Last 24 hours) at 04/05/14 1327 Last data filed at 04/05/14 1141  Gross per 24 hour  Intake    300 ml  Output      0 ml  Net    300 ml   Filed Weights   04/03/14 0649 04/04/14 0450 04/05/14 0700  Weight: 90.4 kg (199 lb 4.7 oz) 91.672 kg (202 lb 1.6 oz) 90.5 kg (199 lb 8.3 oz)    Exam:   General:  alert  Cardiovascular: s1,s2 rrr  Respiratory: few crackles LL  Abdomen: soft, nt, nd  Musculoskeletal: no LE edema   Data Reviewed: Basic Metabolic Panel:  Recent  Labs Lab 03/30/14 0244 03/31/14 0530 04/02/14 0500  NA 147 147 143  K 3.5* 3.5* 3.7  CL 109 111 110  CO2 22 22 21   GLUCOSE 85 222* 132*  BUN 21 25* 19  CREATININE 0.91 0.79 0.68  CALCIUM 9.0 9.2 9.2  MG 2.5 1.9  --   PHOS 3.5 1.8*  --    Liver Function Tests: No results found for this basename: AST, ALT, ALKPHOS, BILITOT, PROT, ALBUMIN,  in the last 168 hours No results found for this basename: LIPASE, AMYLASE,  in the last 168 hours No results found for this basename: AMMONIA,  in the last 168 hours CBC:  Recent Labs Lab 03/30/14 0244 03/31/14 0530  WBC 12.1* 9.5  HGB 9.9* 9.1*  HCT 30.4* 29.1*  MCV 88.4 88.4  PLT 625* 639*   Cardiac Enzymes: No results found for this basename: CKTOTAL, CKMB, CKMBINDEX, TROPONINI,  in the last 168 hours BNP (last 3 results) No results found for this basename: PROBNP,  in the last 8760 hours CBG:  Recent Labs Lab 04/04/14 1712 04/04/14 1942 04/04/14 2134 04/05/14 0802 04/05/14 1158  GLUCAP 177* 106* 119* 114* 124*    Recent Results (from the past 240 hour(s))  CLOSTRIDIUM DIFFICILE BY PCR     Status: None   Collection Time    03/27/14  4:11 PM  Result Value Ref Range Status   C difficile by pcr NEGATIVE  NEGATIVE Final  CLOSTRIDIUM DIFFICILE BY PCR     Status: None   Collection Time    04/03/14  2:14 AM      Result Value Ref Range Status   C difficile by pcr NEGATIVE  NEGATIVE Final     Studies: No results found.  Scheduled Meds: . aspirin  81 mg Oral Daily  . atorvastatin  80 mg Oral q1800  . Chlorhexidine Gluconate Cloth  6 each Topical Q0600  . clopidogrel  75 mg Oral Daily  . feeding supplement (PRO-STAT SUGAR FREE 64)  30 mL Per Tube BID  . heparin subcutaneous  5,000 Units Subcutaneous 3 times per day  . insulin aspart  0-15 Units Subcutaneous TID WC  . insulin aspart  0-5 Units Subcutaneous QHS  . insulin glargine  40 Units Subcutaneous Daily  . lacosamide  100 mg Oral BID  . levETIRAcetam  1,500  mg Oral BID  . lisinopril  5 mg Oral Daily  . metoprolol tartrate  50 mg Oral BID  . multivitamin with minerals  1 tablet Oral Daily  . mupirocin ointment  1 application Nasal BID  . pantoprazole  40 mg Oral Daily  . phenytoin  100 mg Oral TID  . sodium chloride  10-40 mL Intracatheter Q12H  . topiramate  50 mg Oral BID   Continuous Infusions: . sodium chloride      Principal Problem:   Status epilepticus Active Problems:   NSTEMI, initial episode of care   Diabetes    Time spent: >35 minutes     Esperanza Sheets  [facility] Pager [phone]. If 7PM-7AM, please contact night-coverage at [email or web address], password [facility] 04/05/2014, 1:27 PM  LOS: 18 days

## 2014-04-05 NOTE — Progress Notes (Signed)
Subjective: Patient has no complaints.  Remains slightly confused.  Believes [place] is in [place] [facility], [place], year 2013, but did get month of October correct.  Friend at bedside feels she is still confused but improving on a daily basis.   Objective: Current vital signs: BP 103/45  Pulse 63  Temp(Src) 97.9 F (36.6 C) (Oral)  Resp 20  Ht 5' 0.63" (1.54 m)  Wt 90.5 kg (199 lb 8.3 oz)  BMI 38.16 kg/m2  SpO2 100% Vital signs in last 24 hours: Temp:  [97.6 F (36.4 C)-97.9 F (36.6 C)] 97.9 F (36.6 C) (10/20 82950607) Pulse Rate:  [61-67] 63 (10/20 0607) Resp:  [17-24] 20 (10/20 1031) BP: (99-137)/(37-62) 103/45 mmHg (10/20 1031) SpO2:  [96 %-100 %] 100 % (10/20 0607) Weight:  [90.5 kg (199 lb 8.3 oz)] 90.5 kg (199 lb 8.3 oz) (10/20 0700)  Intake/Output from previous day: 10/19 0701 - 10/20 0700 In: 420 [P.O.:360; I.V.:60] Out: -  Intake/Output this shift: Total I/O In: 30 [I.V.:30] Out: -  Nutritional status: Carb Control  Neurologic Exam:  Mental Status: Alert, oriented to month only.  Believes she is in [place] even after told she is in [place], believes it is 2013 but able to get her age and month correct.  Speech dysarthric without evidence of aphasia.  Able to follow 3 step commands without difficulty. Cranial Nerves: II: Discs flat bilaterally; Visual fields grossly normal, pupils equal, round, reactive to light and accommodation III,IV, VI: ptosis not present, extra-ocular motions intact bilaterally V,VII: smile symmetric, facial light touch sensation normal bilaterally VIII: hearing normal bilaterally IX,X: gag reflex present XI: bilateral shoulder shrug XII: midline tongue extension without atrophy or fasciculations  Motor: Moving all extremities antigravity equally throughout Sensory: Pinprick and light touch intact throughout, bilaterally Deep Tendon Reflexes:  2+ bilateral UE and 1+ bilateral LE  Plantars: Right: downgoing   Left:  downgoing    Lab Results: Basic Metabolic Panel:  Recent Labs Lab 03/30/14 0244 03/31/14 0530 04/02/14 0500  NA 147 147 143  K 3.5* 3.5* 3.7  CL 109 111 110  CO2 22 22 21   GLUCOSE 85 222* 132*  BUN 21 25* 19  CREATININE 0.91 0.79 0.68  CALCIUM 9.0 9.2 9.2  MG 2.5 1.9  --   PHOS 3.5 1.8*  --     Liver Function Tests: No results found for this basename: AST, ALT, ALKPHOS, BILITOT, PROT, ALBUMIN,  in the last 168 hours No results found for this basename: LIPASE, AMYLASE,  in the last 168 hours No results found for this basename: AMMONIA,  in the last 168 hours  CBC:  Recent Labs Lab 03/30/14 0244 03/31/14 0530  WBC 12.1* 9.5  HGB 9.9* 9.1*  HCT 30.4* 29.1*  MCV 88.4 88.4  PLT 625* 639*    Cardiac Enzymes: No results found for this basename: CKTOTAL, CKMB, CKMBINDEX, TROPONINI,  in the last 168 hours  Lipid Panel: No results found for this basename: CHOL, TRIG, HDL, CHOLHDL, VLDL, LDLCALC,  in the last 168 hours  CBG:  Recent Labs Lab 04/04/14 1712 04/04/14 1942 04/04/14 2134 04/05/14 0802 04/05/14 1158  GLUCAP 177* 106* 119* 114* 124*    Microbiology: Results for orders placed during the hospital encounter of 03/18/14  MRSA PCR SCREENING     Status: Abnormal   Collection Time    03/18/14  3:08 AM      Result Value Ref Range Status   MRSA by PCR POSITIVE (*) NEGATIVE Final  Comment:            The GeneXpert MRSA Assay (FDA     approved for NASAL specimens     only), is one component of a     comprehensive MRSA colonization     surveillance program. It is not     intended to diagnose MRSA     infection nor to guide or     monitor treatment for     MRSA infections.     RESULT CALLED TO, READ BACK BY AND VERIFIED WITH:     Karie FetchLAUREN HOUT,RN AT [id number] 03/18/14 BY K BARR  CSF CULTURE     Status: None   Collection Time    03/20/14  5:21 PM      Result Value Ref Range Status   Specimen Description CSF   Final   Special Requests Normal   Final    Gram Stain     Final   Value: CYTOSPIN SLIDE WBC PRESENT,BOTH PMN AND MONONUCLEAR     NO ORGANISMS SEEN     [facility]     Performed at [facility]   Culture     Final   Value: NO GROWTH 3 DAYS     Performed at [facility]   Report Status 03/24/2014 FINAL   Final  GRAM STAIN     Status: None   Collection Time    03/20/14  5:21 PM      Result Value Ref Range Status   Specimen Description CSF   Final   Special Requests NONE   Final   Gram Stain     Final   Value: CYTOSPIN SLIDE     WBC PRESENT,BOTH PMN AND MONONUCLEAR     NO ORGANISMS SEEN   Report Status 03/20/2014 FINAL   Final  CLOSTRIDIUM DIFFICILE BY PCR     Status: None   Collection Time    03/22/14  3:28 AM      Result Value Ref Range Status   C difficile by pcr NEGATIVE  NEGATIVE Final  CULTURE, RESPIRATORY (NON-EXPECTORATED)     Status: None   Collection Time    03/22/14 11:07 AM      Result Value Ref Range Status   Specimen Description TRACHEAL ASPIRATE   Final   Special Requests NONE   Final   Gram Stain     Final   Value: RARE WBC PRESENT, PREDOMINANTLY MONONUCLEAR     RARE SQUAMOUS EPITHELIAL CELLS PRESENT     NO ORGANISMS SEEN     Performed at [facility]   Culture     Final   Value: NO GROWTH 2 DAYS     Performed at [facility]   Report Status 03/24/2014 FINAL   Final  URINE CULTURE     Status: None   Collection Time    03/23/14  1:31 PM      Result Value Ref Range Status   Specimen Description URINE, RANDOM   Final   Special Requests NONE   Final   Culture  Setup Time     Final   Value: 03/23/2014 17:22     Performed at [facility]   Colony Count     Final   Value: NO GROWTH     Performed at [facility]   Culture     Final   Value: NO GROWTH     Performed at [facility]   Report Status  03/24/2014 FINAL   Final  CULTURE, BLOOD (ROUTINE X 2)     Status: None   Collection Time    03/23/14  2:47 PM      Result  Value Ref Range Status   Specimen Description BLOOD RIGHT HAND   Final   Special Requests BOTTLES DRAWN AEROBIC AND ANAEROBIC 10CC   Final   Culture  Setup Time     Final   Value: 03/23/2014 20:15     Performed at [facility]   Culture     Final   Value: NO GROWTH 5 DAYS     Performed at [facility]   Report Status 03/29/2014 FINAL   Final  CULTURE, BLOOD (ROUTINE X 2)     Status: None   Collection Time    03/23/14  2:50 PM      Result Value Ref Range Status   Specimen Description BLOOD RIGHT WRIST   Final   Special Requests BOTTLES DRAWN AEROBIC ONLY 4CC   Final   Culture  Setup Time     Final   Value: 03/23/2014 20:14     Performed at [facility]   Culture     Final   Value: NO GROWTH 5 DAYS     Performed at [facility]   Report Status 03/29/2014 FINAL   Final  CLOSTRIDIUM DIFFICILE BY PCR     Status: None   Collection Time    03/27/14  4:11 PM      Result Value Ref Range Status   C difficile by pcr NEGATIVE  NEGATIVE Final  CLOSTRIDIUM DIFFICILE BY PCR     Status: None   Collection Time    04/03/14  2:14 AM      Result Value Ref Range Status   C difficile by pcr NEGATIVE  NEGATIVE Final    Coagulation Studies: No results found for this basename: LABPROT, INR,  in the last 72 hours  Imaging: No results found.  Medications:  Scheduled: . aspirin  81 mg Oral Daily  . atorvastatin  80 mg Oral q1800  . Chlorhexidine Gluconate Cloth  6 each Topical Q0600  . clopidogrel  75 mg Oral Daily  . feeding supplement (PRO-STAT SUGAR FREE 64)  30 mL Per Tube BID  . heparin subcutaneous  5,000 Units Subcutaneous 3 times per day  . insulin aspart  0-15 Units Subcutaneous TID WC  . insulin aspart  0-5 Units Subcutaneous QHS  . insulin glargine  40 Units Subcutaneous Daily  . lacosamide  100 mg Oral BID  . levETIRAcetam  1,500 mg Oral BID  . [START ON 04/06/2014] lisinopril  2.5 mg Oral Daily  . metoprolol tartrate  50 mg Oral BID  .  multivitamin with minerals  1 tablet Oral Daily  . mupirocin ointment  1 application Nasal BID  . pantoprazole  40 mg Oral Daily  . phenytoin  100 mg Oral TID  . [START ON 04/13/2014] predniSONE  10 mg Oral QAC breakfast  . [START ON 04/11/2014] predniSONE  20 mg Oral QAC breakfast  . [START ON 04/09/2014] predniSONE  30 mg Oral QAC breakfast  . [START ON 04/06/2014] predniSONE  40 mg Oral QAC breakfast  . sodium chloride  10-40 mL Intracatheter Q12H  . topiramate  25 mg Oral BID    Assessment/Plan: 73 yo F who presented with refractory status epilepticus. No recurrent seizures.  Patient remains confused.  Per friend at bedside she is not her baseline but  seems to be improving daily. Repeat EEG obtained today--results pending. With NORSE, approximately half remain cryptogenic while a significant percentage of the remaining are either infectious or autoimmune in nature. Her CSF was not supportive of an infectious nature. Due to plateau of mental status will add a steroid taper.  Recommend: 1) 40 mg Prednisone taper (ordered) 2) EEG--pending results 3) Decrease Topamax to 25 mg BID (done) 4) Will continue to follow.    Felicie Morn PA-C [facility] [phone]  04/05/2014, 1:48 PM

## 2014-04-05 NOTE — Clinical Social Work Note (Signed)
CSW spoke with patient son who stated [facility]- [facility] who stated they anticipate they can take the patient tomorrow.  CSW will continue to follow.  Merlyn LotJenna Holoman, LCSWA Clinical Social Worker [phone]

## 2014-04-06 LAB — GLUCOSE, CAPILLARY
Glucose-Capillary: 109 mg/dL — ABNORMAL HIGH (ref 70–99)
Glucose-Capillary: 240 mg/dL — ABNORMAL HIGH (ref 70–99)
Glucose-Capillary: 188 mg/dL — ABNORMAL HIGH (ref 70–99)
Glucose-Capillary: 189 mg/dL — ABNORMAL HIGH (ref 70–99)

## 2014-04-06 NOTE — Progress Notes (Signed)
[facility] PROGRESS NOTE  Brooke Gross ZOX:[id number] DOB: [birth year] DOA: 03/18/2014 PCP: Estanislado Pandy, MD  Assessment/Plan:  73 y/o female with PMH of Bipolar DO, DM, CKD, HLD, HTN, chronic back pains, obesity transferred from [facility] with status epilepticus and NSTEMI on 10/2.  -Pt initially brought to [facility] ED: She had several seizures en route to ER and while in ER. She was also noted to have ECG changes and elevated troponin. She was transferred to [facility]. Pt was intubated for airway protection, started on antiepileptics, sedated and admitted to ICU; 10/12 Pt extubated seizure controlled on multiple medication regimen, Patient is still delirious     1. New onset refractory status epilepticus; mentation gradually improving. CT and MRI brain unremarkable. LP unremarkable. Neurology following. Last EEG stable. Seizures controlled on dilantin, keppra, vimpat, topamax/decreased; seems to have improved on prednisone taper which will be continued, discussed with neurology. Taper off Topamax next 2 days. If seizure-free discharge to SNF tomorrow.   2. NSTEMI;  Echo (10/2): LVEF 50 to 55% with inferior/inferoseptal hypokinesis -on BB, ASA, Plavix, Statin; Lexi scan shows no reversible ischemia. Continue medical treatment per cardiology.   3. Acute hypoxic respiratory failure due to aspiration pneumonia; initially intubated/extubated 10/12; stable   4. Sepsis/septic shock/pnuemonia; CXR: showed possible pneumonia; sepsis resolved  -off pressors; patient completed IV atx; afebrile; blood cultures NGTD; leukocytosis resolved;    5. AKI likely due to ATN/sepsis on admission; resolved with IVF;    6. DM on insulin; ha1c-7.6; cont insulin regimen; d/c metformin due to AKI  CBG (last 3)   Recent Labs  04/05/14 1658 04/05/14 2155 04/06/14 0729  GLUCAP 172* 201* 109*     7. Anemia IDA; no obvious s/s of acute bleeding;  Start PO iron;    8. HTN, restarted lisinopril  (decreased the dose due to soft BP), cont BB     Code Status: partial;  Family Communication: d/w patient, updated her son (indicate person spoken with, relationship, and if by phone, the number) Disposition Plan: SNF pend clinical improvement, cardiology clearance; SNF 1-2 days if stable     Procedures:  10/02 Transfer to [facility], intubated shortly after arrival. Dopamine initiated for hypotension. Vanc, acyclovir, ceftriaxone and ampicillin initiated empirically  10/02 CT head [facility]): no acute abn  10/02 Neuro consult: Etiology of seizure activity is unclear. Acute ischemic cerebral infarction cannot be ruled out.  Recommendations: Continue Keppra IV at 1000 mg every 12 hours. Continue Vimpat at 100 mg every 12 hours. Propofol IV for sedation as well as as seizure control. EEG to rule out continued subclinical seizure activity. MRI of the brain without contrast to rule out acute cerebral infarction.  10/2 echogram with EF of 50% but diffuse wall motion abnormality. 10/02 Cards consult: Her biomarkers suggest a fairly large MI and ECG has many high risk features. 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 ASA, statin, heparin initiated. Beta blocker not option due to hypotension.  10/02 TTE: LVEF is approximately 50 to 55% with inferior/inferoseptal hypokinesis, grade 1 diastolic dysfx  10/03 No WUA. Remained on dopamine  10/03 Persistent seizure-like activity despite mutliple anti-consvulsants. Burst suppression initiated. LP recommended to R/O infectious etiology  10/03 cont EEG: Burst suppression, asymmetric, with greater suppression on the right. Semi-periodic epileptiform discharges, left hemisphere, maximal over the mid/posterior temporal region. The findings indicate a severe diffuse encephalopathy with epileptogenic potential noted over the left hemisphere, particularly over the left posterior temporal-occipital region. No ongoing seizure activity was observed that would explain an  altered mental status  10/04 Remained in medically induced coma and on dopamine.  10/04 LP performed by Neuro. CSF: glu 123, protein 30, RBC 3, WBC 8, 52% lymphs  10/05 Unable to tolerated decrease in propofol due to tachypnea. Ketamine initiated. Propofol weaned to off. DA transitioned to NE. Anti-bacterial abx discontinued. Acyclovir continued  10/05 CT head: no acute abn  10/06 Low grade fever. Resp cx and C diff PCR ordered. Ketamine stopped. Midaz gtt @ 10 mg/hr. No overt seizures. Continuous EEG  10/08 No further seizure. Remained encephalopathic. Tachypneic on WUA/SBT. Nearly off vasopressors. Remained severely volume overloaded. Lasix ordered  10/09 Continuous EEG discontinued. Tachypneic and excessive coughing when dexmedetomidine gtt discontinued. Son updated in detail @ bedside. He indicated that he would not want to pursue trach tube and long term care if neuro prognosis for functional recovery is not good  10/09 MRI brain >> negative  10/10 opening eyes to command  10/12 extubated  10/13 made LCB with no CPR/cardioversion/intubation, refused tracheostomy.  10/20. EEG unremarkable. 10/21. Lexi scan showing no reversible ischemia.   INDWELLING DEVICES::  ETT 10/02 >> 10/13  R IJ CVL 10/02 >>10/11  Right arm PICC line   MICRO DATA:  PCT 10/07: 0.42, 10/08: 0.32. 10/09: 0.37  MRSA PCR 10/02 >> POS  CSF 10/04 >> NEG  HSV PCR (CSF) 10/04 >> NEG  C diff PCR 10/06 >> NEG  Resp 10/06 >> NEG  Urine 10/07 >> NEG  Blood 10/07 >> NTD    ANTIMICROBIALS:  Vanc 10/02 >> 10/05  Ceftriaxone 10/02 >> 10/05  Ampicillin 10/02 >> 10/05  Acyclovir 10/02 >> 10/08  Vancomycin 10/7 >> 10/09  Zosyn 10/7 >> 10/14    Consults  PC CM, cardiology, neurology    HPI/Subjective:  Awake minimal to no confusion, no headache, no chest-abd pain, no shortness of breath, no focal weakness. She is oriented x2 today.   Objective: Filed Vitals:   04/06/14 0502  BP: 122/60  Pulse: 80   Temp: 98 F (36.7 C)  Resp: 18    Intake/Output Summary (Last 24 hours) at 04/06/14 1009 Last data filed at 04/05/14 1842  Gross per 24 hour  Intake     60 ml  Output      0 ml  Net     60 ml   Filed Weights   04/03/14 0649 04/04/14 0450 04/05/14 0700  Weight: 90.4 kg (199 lb 4.7 oz) 91.672 kg (202 lb 1.6 oz) 90.5 kg (199 lb 8.3 oz)    Exam:   General:  Awake and minimally confused, follows basic commands, thinks she is in the hospital and [place].  Cardiovascular: s1,s2 rrr  Respiratory: few crackles LL  Abdomen: soft, nt, nd  Musculoskeletal: no LE edema   Data Reviewed: Basic Metabolic Panel:  Recent Labs Lab 03/31/14 0530 04/02/14 0500  NA 147 143  K 3.5* 3.7  CL 111 110  CO2 22 21  GLUCOSE 222* 132*  BUN 25* 19  CREATININE 0.79 0.68  CALCIUM 9.2 9.2  MG 1.9  --   PHOS 1.8*  --    Liver Function Tests: No results found for this basename: AST, ALT, ALKPHOS, BILITOT, PROT, ALBUMIN,  in the last 168 hours No results found for this basename: LIPASE, AMYLASE,  in the last 168 hours No results found for this basename: AMMONIA,  in the last 168 hours CBC:  Recent Labs Lab 03/31/14 0530  WBC 9.5  HGB 9.1*  HCT 29.1*  MCV  88.4  PLT 639*   Cardiac Enzymes: No results found for this basename: CKTOTAL, CKMB, CKMBINDEX, TROPONINI,  in the last 168 hours BNP (last 3 results) No results found for this basename: PROBNP,  in the last 8760 hours CBG:  Recent Labs Lab 04/05/14 0802 04/05/14 1158 04/05/14 1658 04/05/14 2155 04/06/14 0729  GLUCAP 114* 124* 172* 201* 109*    Recent Results (from the past 240 hour(s))  CLOSTRIDIUM DIFFICILE BY PCR     Status: None   Collection Time    03/27/14  4:11 PM      Result Value Ref Range Status   C difficile by pcr NEGATIVE  NEGATIVE Final  CLOSTRIDIUM DIFFICILE BY PCR     Status: None   Collection Time    04/03/14  2:14 AM      Result Value Ref Range Status   C difficile by pcr NEGATIVE  NEGATIVE  Final  OVA AND PARASITE EXAMINATION     Status: None   Collection Time    04/04/14 12:59 AM      Result Value Ref Range Status   Specimen Description STOOL   Final   Special Requests NONE   Final   Ova and parasites     Final   Value: NO OVA OR PARASITES SEEN     Performed at [facility]   Report Status 04/05/2014 FINAL   Final     Studies: Nm Myocar Multi W/spect W/wall Motion / Ef  04/05/2014   CLINICAL DATA:  Chest pain  EXAM: Lexiscan Myovue  TECHNIQUE: The patient received IV Lexiscan .4mg  over 15 seconds. 33.0 mCi of Technetium 45m Sestamibi injected at 30 seconds. Quantitative SPECT images were obtained in the vertical, horizontal and short axis planes after a 45 minute delay. Rest images were obtained with similar planes and delay using 10.2 mCi of Technetium 3m Sestamibi.  FINDINGS: ECG:  NSR normal ECG no changes with infusion  Symptoms:  Sleepy  RAW Data: Normal  QPS:  SDS nonspecific at anterior base  Quantitative Gated SPECT EF: 63%  Perfusion Images: Normal  IMPRESSION: Normal lexiscan myovue with no evidence of ischemia or infarct EF 63%  Charlton Haws   Electronically Signed   By: Charlton Haws M.D.   On: 04/05/2014 13:48    Scheduled Meds: . aspirin  81 mg Oral Daily  . atorvastatin  80 mg Oral q1800  . Chlorhexidine Gluconate Cloth  6 each Topical Q0600  . clopidogrel  75 mg Oral Daily  . feeding supplement (GLUCERNA SHAKE)  237 mL Oral TID BM  . heparin subcutaneous  5,000 Units Subcutaneous 3 times per day  . insulin aspart  0-15 Units Subcutaneous TID WC  . insulin aspart  0-5 Units Subcutaneous QHS  . insulin glargine  40 Units Subcutaneous Daily  . lacosamide  100 mg Oral BID  . levETIRAcetam  1,500 mg Oral BID  . lisinopril  2.5 mg Oral Daily  . metoprolol tartrate  50 mg Oral BID  . multivitamin with minerals  1 tablet Oral Daily  . mupirocin ointment  1 application Nasal BID  . pantoprazole  40 mg Oral Daily  . phenytoin  100 mg Oral TID  .  [START ON 04/13/2014] predniSONE  10 mg Oral QAC breakfast  . [START ON 04/11/2014] predniSONE  20 mg Oral QAC breakfast  . [START ON 04/09/2014] predniSONE  30 mg Oral QAC breakfast  . predniSONE  40 mg Oral QAC breakfast  . sodium chloride  10-40 mL Intracatheter Q12H  . topiramate  25 mg Oral BID   Continuous Infusions: . sodium chloride      Principal Problem:   Status epilepticus Active Problems:   NSTEMI, initial episode of care   Diabetes    Time spent: >35 minutes     Upmc PresbyterianINGH,PRASHANT K  [facility] Pager [phone]. If 7PM-7AM, please contact night-coverage at [email or web address], password [email or web address] 04/06/2014, 10:09 AM  LOS: 19 days

## 2014-04-06 NOTE — Clinical Social Work Note (Signed)
Patient is able to DC tomorrow- [facility].  CSW will continue to follow.  Merlyn LotJenna Holoman, LCSWA Clinical Social Worker [phone]

## 2014-04-06 NOTE — Progress Notes (Signed)
Subjective: Patient feels "depressed today". No other complaints.   Objective: Current vital signs: BP 122/60  Pulse 80  Temp(Src) 98 F (36.7 C) (Oral)  Resp 18  Ht 5' 0.63" (1.54 m)  Wt 90.5 kg (199 lb 8.3 oz)  BMI 38.16 kg/m2  SpO2 97% Vital signs in last 24 hours: Temp:  [97.9 F (36.6 C)-98 F (36.7 C)] 98 F (36.7 C) (10/21 0502) Pulse Rate:  [59-84] 80 (10/21 0502) Resp:  [18-24] 18 (10/21 0502) BP: (99-137)/(41-77) 122/60 mmHg (10/21 0502) SpO2:  [97 %-99 %] 97 % (10/21 0502)  Intake/Output from previous day: 10/20 0701 - 10/21 0700 In: 60 [I.V.:60] Out: -  Intake/Output this shift:   Nutritional status: Carb Control  Neurologic Exam: Mental Status:  Alert, oriented to month only. Again, believes she is in [place] even after told she is in [place], believes it is 2013 but able to get her age and month correct. Speech dysarthric without evidence of aphasia. Able to follow 3 step commands without difficulty.  Cranial Nerves:  II: Visual fields grossly normal, pupils equal, round, reactive to light and accommodation  III,IV, VI: ptosis not present, extra-ocular motions intact bilaterally  V,VII: smile symmetric, facial light touch sensation normal bilaterally  VIII: hearing normal bilaterally  IX,X: gag reflex present  XI: bilateral shoulder shrug  XII: midline tongue extension without atrophy or fasciculations  Motor:  Moving all extremities antigravity equally throughout  Sensory: Pinprick and light touch intact throughout, bilaterally  Deep Tendon Reflexes:  2+ bilateral UE and 1+ bilateral LE  Plantars:  Right: downgoing Left: downgoing      Lab Results: Basic Metabolic Panel:  Recent Labs Lab 03/31/14 0530 04/02/14 0500  NA 147 143  K 3.5* 3.7  CL 111 110  CO2 22 21  GLUCOSE 222* 132*  BUN 25* 19  CREATININE 0.79 0.68  CALCIUM 9.2 9.2  MG 1.9  --   PHOS 1.8*  --     Liver Function Tests: No results found for this basename: AST,  ALT, ALKPHOS, BILITOT, PROT, ALBUMIN,  in the last 168 hours No results found for this basename: LIPASE, AMYLASE,  in the last 168 hours No results found for this basename: AMMONIA,  in the last 168 hours  CBC:  Recent Labs Lab 03/31/14 0530  WBC 9.5  HGB 9.1*  HCT 29.1*  MCV 88.4  PLT 639*    Cardiac Enzymes: No results found for this basename: CKTOTAL, CKMB, CKMBINDEX, TROPONINI,  in the last 168 hours  Lipid Panel: No results found for this basename: CHOL, TRIG, HDL, CHOLHDL, VLDL, LDLCALC,  in the last 168 hours  CBG:  Recent Labs Lab 04/05/14 0802 04/05/14 1158 04/05/14 1658 04/05/14 2155 04/06/14 0729  GLUCAP 114* 124* 172* 201* 109*    Microbiology: Results for orders placed during the hospital encounter of 03/18/14  MRSA PCR SCREENING     Status: Abnormal   Collection Time    03/18/14  3:08 AM      Result Value Ref Range Status   MRSA by PCR POSITIVE (*) NEGATIVE Final   Comment:            The GeneXpert MRSA Assay (FDA     approved for NASAL specimens     only), is one component of a     comprehensive MRSA colonization     surveillance program. It is not     intended to diagnose MRSA     infection nor to guide or  monitor treatment for     MRSA infections.     RESULT CALLED TO, READ BACK BY AND VERIFIED WITH:     Karie Fetch AT 4098 03/18/14 BY K BARR  CSF CULTURE     Status: None   Collection Time    03/20/14  5:21 PM      Result Value Ref Range Status   Specimen Description CSF   Final   Special Requests Normal   Final   Gram Stain     Final   Value: CYTOSPIN SLIDE WBC PRESENT,BOTH PMN AND MONONUCLEAR     NO ORGANISMS SEEN     Performed at [facility]     Performed at [facility]   Culture     Final   Value: NO GROWTH 3 DAYS     Performed at [facility]   Report Status 03/24/2014 FINAL   Final  GRAM STAIN     Status: None   Collection Time    03/20/14  5:21 PM      Result Value Ref Range Status    Specimen Description CSF   Final   Special Requests NONE   Final   Gram Stain     Final   Value: CYTOSPIN SLIDE     WBC PRESENT,BOTH PMN AND MONONUCLEAR     NO ORGANISMS SEEN   Report Status 03/20/2014 FINAL   Final  CLOSTRIDIUM DIFFICILE BY PCR     Status: None   Collection Time    03/22/14  3:28 AM      Result Value Ref Range Status   C difficile by pcr NEGATIVE  NEGATIVE Final  CULTURE, RESPIRATORY (NON-EXPECTORATED)     Status: None   Collection Time    03/22/14 11:07 AM      Result Value Ref Range Status   Specimen Description TRACHEAL ASPIRATE   Final   Special Requests NONE   Final   Gram Stain     Final   Value: RARE WBC PRESENT, PREDOMINANTLY MONONUCLEAR     RARE SQUAMOUS EPITHELIAL CELLS PRESENT     NO ORGANISMS SEEN     Performed at [facility]   Culture     Final   Value: NO GROWTH 2 DAYS     Performed at [facility]   Report Status 03/24/2014 FINAL   Final  URINE CULTURE     Status: None   Collection Time    03/23/14  1:31 PM      Result Value Ref Range Status   Specimen Description URINE, RANDOM   Final   Special Requests NONE   Final   Culture  Setup Time     Final   Value: 03/23/2014 17:22     Performed at [facility] Count     Final   Value: NO GROWTH     Performed at [facility]   Culture     Final   Value: NO GROWTH     Performed at [facility]   Report Status 03/24/2014 FINAL   Final  CULTURE, BLOOD (ROUTINE X 2)     Status: None   Collection Time    03/23/14  2:47 PM      Result Value Ref Range Status   Specimen Description BLOOD RIGHT HAND   Final   Special Requests BOTTLES DRAWN AEROBIC AND ANAEROBIC 10CC   Final   Culture  Setup Time     Final  Value: 03/23/2014 20:15     [facility]   Culture     Final   Value: NO GROWTH 5 DAYS     [facility]   Report Status 03/29/2014 FINAL   Final  CULTURE, BLOOD (ROUTINE X 2)     Status: None    Collection Time    03/23/14  2:50 PM      Result Value Ref Range Status   Specimen Description BLOOD RIGHT WRIST   Final   Special Requests BOTTLES DRAWN AEROBIC ONLY 4CC   Final   Culture  Setup Time     Final   Value: 03/23/2014 20:14     [facility]   Culture     Final   Value: NO GROWTH 5 DAYS     [facility]   Report Status 03/29/2014 FINAL   Final  CLOSTRIDIUM DIFFICILE BY PCR     Status: None   Collection Time    03/27/14  4:11 PM      Result Value Ref Range Status   C difficile by pcr NEGATIVE  NEGATIVE Final  CLOSTRIDIUM DIFFICILE BY PCR     Status: None   Collection Time    04/03/14  2:14 AM      Result Value Ref Range Status   C difficile by pcr NEGATIVE  NEGATIVE Final  OVA AND PARASITE EXAMINATION     Status: None   Collection Time    04/04/14 12:59 AM      Result Value Ref Range Status   Specimen Description STOOL   Final   Special Requests NONE   Final   Ova and parasites     Final   Value: NO OVA OR PARASITES SEEN     [facility]   Report Status 04/05/2014 FINAL   Final    Coagulation Studies: No results found for this basename: LABPROT, INR,  in the last 72 hours  Imaging: Nm Myocar Multi W/spect W/wall Motion / Ef  04/05/2014   CLINICAL DATA:  Chest pain  EXAM: Lexiscan Myovue  TECHNIQUE: The patient received IV Lexiscan .4mg  over 15 seconds. 33.0 mCi of Technetium 5643m Sestamibi injected at 30 seconds. Quantitative SPECT images were obtained in the vertical, horizontal and short axis planes after a 45 minute delay. Rest images were obtained with similar planes and delay using 10.2 mCi of Technetium 6243m Sestamibi.  FINDINGS: ECG:  NSR normal ECG no changes with infusion  Symptoms:  Sleepy  RAW Data: Normal  QPS:  SDS nonspecific at anterior base  Quantitative Gated SPECT EF: 63%  Perfusion Images: Normal  IMPRESSION: Normal lexiscan myovue with no evidence of ischemia or infarct EF 63%  Charlton HawsPeter  Nishan   Electronically Signed   By: Charlton HawsPeter  Nishan M.D.   On: 04/05/2014 13:48    Medications:  Scheduled: . aspirin  81 mg Oral Daily  . atorvastatin  80 mg Oral q1800  . Chlorhexidine Gluconate Cloth  6 each Topical Q0600  . clopidogrel  75 mg Oral Daily  . feeding supplement (GLUCERNA SHAKE)  237 mL Oral TID BM  . heparin subcutaneous  5,000 Units Subcutaneous 3 times per day  . insulin aspart  0-15 Units Subcutaneous TID WC  . insulin aspart  0-5 Units Subcutaneous QHS  . insulin glargine  40 Units Subcutaneous Daily  . lacosamide  100 mg Oral BID  . levETIRAcetam  1,500 mg Oral  BID  . lisinopril  2.5 mg Oral Daily  . metoprolol tartrate  50 mg Oral BID  . multivitamin with minerals  1 tablet Oral Daily  . mupirocin ointment  1 application Nasal BID  . pantoprazole  40 mg Oral Daily  . phenytoin  100 mg Oral TID  . [START ON 04/13/2014] predniSONE  10 mg Oral QAC breakfast  . [START ON 04/11/2014] predniSONE  20 mg Oral QAC breakfast  . [START ON 04/09/2014] predniSONE  30 mg Oral QAC breakfast  . predniSONE  40 mg Oral QAC breakfast  . sodium chloride  10-40 mL Intracatheter Q12H  . topiramate  25 mg Oral BID   EEG: This is a normal awake and drowsy electroencephalogram. No epileptiform activity is noted.   Assessment/Plan:  73 yo F who presented with refractory status epilepticus. No recurrent seizures. Patient remains confused. Per friend at bedside she is not her baseline but seems to be improving daily. Repeat EEG shows no seizure activity. No further seizures after tapering her Topamax. No significant changes in exam.    Recommend:  1)  Prednisone taper   3) Decrease Topamax to 25 mg BID --if no seizures over night will decrease to 25 mg daily tomorrow 4) Will continue to follow.       Felicie MornDavid Smith PA-C [facility] [phone]  04/06/2014, 9:59 AM

## 2014-04-06 NOTE — Progress Notes (Signed)
Speech Language Pathology Treatment: Dysphagia  Patient Details Name: Brooke Gross MRN: [id number] DOB: [birth year] Today's Date: 04/06/2014 Time: [id number] SLP Time Calculation (min): 14 min  Assessment / Plan / Recommendation Clinical Impression  Upon chart review, note that pt's diet has been advanced from Dys 2/nectar thick liquids to regular textures and thin liquids. Per NT, she has still been thickening liquids. SLP provided trials of current diet textures with prolonged mastication with regular solids and consistent, immediate coughing with thin liquids despite Mod cues from SLP for small sips and a slow rate of intake. Pt without coughing during trials of nectar thick liquids. Recommend to adjust diet to Dys 3 textures and nectar thick liquids. SLP to continue to follow.   HPI HPI: 73 [facility] and NSTEMI. Intubated for airway protection 10/2 - 10/13. Hx of Bipolar, essential tremor, anxiety, Migraine, Vertigo. MRI negative.    Pertinent Vitals Pain Assessment: No/denies pain  SLP Plan  Continue with current plan of care    Recommendations Diet recommendations: Dysphagia 3 (mechanical soft);Nectar-thick liquid Liquids provided via: Cup;No straw Medication Administration: Whole meds with puree Supervision: Full supervision/cueing for compensatory strategies;Patient able to self feed Compensations: Slow rate;Small sips/bites Postural Changes and/or Swallow Maneuvers: Seated upright 90 degrees              Oral Care Recommendations: Oral care BID Follow up Recommendations: 24 hour supervision/assistance;Skilled Nursing facility Plan: Continue with current plan of care    GO      Brooke Gross, M.A. CCC-SLP [phone]  Brooke Gross, Brooke Gross 04/06/2014, 3:19 PM

## 2014-04-06 NOTE — Progress Notes (Signed)
Patient Profile: 73 yo female w/ bipolar DO, DM, CKD, HLD, obesity, was admitted 10/02 early am w/ status epilepticus and NSTEMI. Troponin sustained > 20, EF 50-55% with inf hypokinesis on echo. NST: Normal perfusion; no scar EF 63% on nuclear study.   Subjective: No complaints.   Objective: Vital signs in last 24 hours: Temp:  [97.9 F (36.6 C)-98 F (36.7 C)] 98 F (36.7 C) (10/21 0502) Pulse Rate:  [59-84] 80 (10/21 0502) Resp:  [18-20] 18 (10/21 0502) BP: (120-132)/(41-77) 122/60 mmHg (10/21 0502) SpO2:  [97 %-99 %] 97 % (10/21 0502) Last BM Date: 04/05/14  Intake/Output from previous day: 10/20 0701 - 10/21 0700 In: 60 [I.V.:60] Out: -  Intake/Output this shift: Total I/O In: 360 [P.O.:360] Out: -   Medications Current Facility-Administered Medications  Medication Dose Route Frequency Provider Last Rate Last Dose  . 0.9 %  sodium chloride infusion  250 mL Intravenous PRN Rahul P Desai, PA-C      . 0.9 %  sodium chloride infusion   Intravenous Continuous Alyson ReedyWesam G Yacoub, MD      . acetaminophen (TYLENOL) tablet 650 mg  650 mg Oral Q6H PRN Esperanza SheetsUlugbek N Buriev, MD   650 mg at 04/03/14 2152  . aspirin chewable tablet 81 mg  81 mg Oral Daily Donato SchultzMark Skains, MD   81 mg at 04/06/14 1010  . atorvastatin (LIPITOR) tablet 80 mg  80 mg Oral q1800 Esperanza SheetsUlugbek N Buriev, MD   80 mg at 04/05/14 1735  . Chlorhexidine Gluconate Cloth 2 % PADS 6 each  6 each Topical Q0600 Esperanza SheetsUlugbek N Buriev, MD   6 each at 04/06/14 0534  . clopidogrel (PLAVIX) tablet 75 mg  75 mg Oral Daily Donato SchultzMark Skains, MD   75 mg at 04/06/14 1010  . feeding supplement (GLUCERNA SHAKE) (GLUCERNA SHAKE) liquid 237 mL  237 mL Oral TID BM Heather Cornelison Pitts, RD   237 mL at 04/06/14 1012  . fentaNYL (SUBLIMAZE) injection 25-100 mcg  25-100 mcg Intravenous Q2H PRN Merwyn Katosavid B Simonds, MD   100 mcg at 03/29/14 0737  . food thickener (THICK IT) powder   Oral PRN Esperanza SheetsUlugbek N Buriev, MD      . heparin injection 5,000 Units  5,000 Units  Subcutaneous 3 times per day Baldemar Fridaylison M Masters, [facility]   5,000 Units at 04/06/14 0534  . insulin aspart (novoLOG) injection 0-15 Units  0-15 Units Subcutaneous TID WC Esperanza SheetsUlugbek N Buriev, MD   3 Units at 04/05/14 1735  . insulin aspart (novoLOG) injection 0-5 Units  0-5 Units Subcutaneous QHS Esperanza SheetsUlugbek N Buriev, MD   2 Units at 04/05/14 2319  . insulin glargine (LANTUS) injection 40 Units  40 Units Subcutaneous Daily Merwyn Katosavid B Simonds, MD   40 Units at 04/06/14 1011  . lacosamide (VIMPAT) tablet 100 mg  100 mg Oral BID Esperanza SheetsUlugbek N Buriev, MD   100 mg at 04/06/14 1010  . levETIRAcetam (KEPPRA) tablet 1,500 mg  1,500 mg Oral BID Esperanza SheetsUlugbek N Buriev, MD   1,500 mg at 04/06/14 1010  . lisinopril (PRINIVIL,ZESTRIL) tablet 2.5 mg  2.5 mg Oral Daily Esperanza SheetsUlugbek N Buriev, MD   2.5 mg at 04/06/14 1011  . metoprolol (LOPRESSOR) tablet 50 mg  50 mg Oral BID Donato SchultzMark Skains, MD   50 mg at 04/06/14 1010  . multivitamin with minerals tablet 1 tablet  1 tablet Oral Daily Esperanza SheetsUlugbek N Buriev, MD   1 tablet at 04/06/14 1010  . mupirocin ointment (BACTROBAN) 2 % 1 application  1  application Nasal BID Esperanza SheetsUlugbek N Buriev, MD   1 application at 04/06/14 1011  . pantoprazole (PROTONIX) EC tablet 40 mg  40 mg Oral Daily Esperanza SheetsUlugbek N Buriev, MD   40 mg at 04/06/14 1011  . phenytoin (DILANTIN) ER capsule 100 mg  100 mg Oral TID Esperanza SheetsUlugbek N Buriev, MD   100 mg at 04/06/14 1010  . [START ON 04/13/2014] predniSONE (DELTASONE) tablet 10 mg  10 mg Oral QAC breakfast Ulice Dashavid R Smith, PA-C      . Melene Muller[START ON 04/11/2014] predniSONE (DELTASONE) tablet 20 mg  20 mg Oral QAC breakfast Ulice Dashavid R Smith, PA-C      . Melene Muller[START ON 04/09/2014] predniSONE (DELTASONE) tablet 30 mg  30 mg Oral QAC breakfast Ulice Dashavid R Smith, PA-C      . predniSONE (DELTASONE) tablet 40 mg  40 mg Oral QAC breakfast Ulice Dashavid R Smith, PA-C   40 mg at 04/06/14 0756  . RESOURCE THICKENUP CLEAR   Oral PRN Esperanza SheetsUlugbek N Buriev, MD      . sodium chloride 0.9 % injection 10-40 mL  10-40 mL Intracatheter PRN Alyson ReedyWesam G  Yacoub, MD   20 mL at 03/26/14 0020  . sodium chloride 0.9 % injection 10-40 mL  10-40 mL Intracatheter Q12H Alyson ReedyWesam G Yacoub, MD   10 mL at 04/05/14 2320  . sodium chloride 0.9 % injection 10-40 mL  10-40 mL Intracatheter PRN Alyson ReedyWesam G Yacoub, MD   30 mL at 04/05/14 1842  . topiramate (TOPAMAX) tablet 25 mg  25 mg Oral BID Ulice Dashavid R Smith, PA-C   25 mg at 04/06/14 1011    PE: General appearance: alert, cooperative, no distress and moderately obese Neck: no carotid bruit and no JVD Lungs: clear to auscultation bilaterally Heart: regular rate and rhythm Extremities: no LEE Pulses: 2+ and symmetric Skin: warm and dry Neurologic: Grossly normal   Studies/Results:  NST 04/05/14 FINDINGS:  ECG: NSR normal ECG no changes with infusion  Symptoms: Sleepy  RAW Data: Normal  QPS: SDS nonspecific at anterior base  Quantitative Gated SPECT EF: 63%  Perfusion Images: Normal  IMPRESSION:  Normal lexiscan myovue with no evidence of ischemia or infarct EF  63%   Assessment/Plan  Principal Problem:   Status epilepticus Active Problems:   NSTEMI, initial episode of care   Diabetes  Nuclear stress test 04/05/14 demonstrated normal perfusion; no scar. EF 63% on nuclear study. She denies CP and is on appropriate medical therapy: ASA, BB, ACE and statin. HR and BP both well controlled. No further cardiac w/u indicated. Will likely sign off today, but will defer to MD.     LOS: 19 days    Brittainy M. Sharol HarnessSimmons, PA-C 04/06/2014 1:01 PM   Agree with assessment and plan. Continue medical therapy as above. Will sign off; call if additional cardiac issues arise.   Lennette Biharihomas A. Kelly, MD, [facility] HospitalFACC 04/06/2014 2:07 PM

## 2014-04-06 NOTE — Progress Notes (Signed)
Occupational Therapy Treatment Patient Details Name: Sharyn DrossHarriett Wixom MRN: [id number] DOB: 1941/03/26 Today's Date: 04/06/2014    History of present illness 73 [facility]ptic[facility] and NSTEMI. Intubated for airway protection 10/2 - 10/13. Hx of Bipolar, essential tremor, anxiety, Migraine, Vertigo. MRI negative   OT comments  Pt progressing. Continue to recommend SNF for rehab.  Follow Up Recommendations  SNF;Supervision/Assistance - 24 hour    Equipment Recommendations  None recommended by OT    Recommendations for Other Services      Precautions / Restrictions Precautions Precautions: Fall Restrictions Weight Bearing Restrictions: No       Mobility Bed Mobility Overal bed mobility: Needs Assistance Bed Mobility: Sit to Supine       Sit to supine: Min assist   General bed mobility comments: assistance with LE. Assistance to scoot to HOB-cues for technique.  Transfers Overall transfer level: Needs assistance Equipment used: 2 person hand held assist Transfers: Sit to/from [employer] CorporationStand;Stand Pivot Transfers Sit to Stand: Min assist Stand pivot transfers: Mod assist       General transfer comment: Performed stand pivot a few times. Cues for technique.    Balance                                   ADL Overall ADL's : Needs assistance/impaired     Grooming: Wash/dry face;Brushing hair;Oral care;Set up;Supervision/safety;Sitting   Upper Body Bathing: Set up;Supervision/ safety;Sitting   Lower Body Bathing: Minimal assistance (assistance with buttocks area)   Upper Body Dressing : Minimal assistance;Sitting   Lower Body Dressing: Maximal assistance;Sitting/lateral leans (socks)   Toilet Transfer: Moderate assistance;Stand-pivot;BSC           Functional mobility during ADLs: Moderate assistance (stand pivot) General ADL Comments: Pt performed ADLs in chair and EOB. Performed stand pivot transfer a few times.  Educated on deep breathing technique.  Pt with no physical assist needed for balance sitting EOB.      Vision                     Perception     Praxis      Cognition   Behavior During Therapy: [facility] HealthcareWFL for tasks assessed/performed Overall Cognitive Status: Impaired/Different from baseline Area of Impairment: Orientation;Following commands;Attention;Problem solving;Awareness;Memory Orientation Level: Disoriented to;Place;Situation Current Attention Level: Sustained Memory: Decreased short-term memory  Following Commands: Follows one step commands inconsistently   Awareness: Intellectual   General Comments: cues to stay on task. Pt initially able to state place and then could not remember later.     Extremity/Trunk Assessment               Exercises Other Exercises Other Exercises: Performed AROM shoulder flexion with both arms and elbow flexion with both arms (approximately 20-25 reps each).  Other Exercises: ankle pumps   Shoulder Instructions       General Comments      Pertinent Vitals/ Pain       Pain Assessment: No/denies pain  Home Living                                          Prior Functioning/Environment              Frequency Min 2X/week     Progress Toward Goals  OT Goals(current  goals can now be found in the care plan section)  Progress towards OT goals: Progressing toward goals-goals updated  Acute Rehab OT Goals Patient Stated Goal: not stated OT Goal Formulation: With patient Time For Goal Achievement: 04/13/14 Potential to Achieve Goals: Good ADL Goals Pt Will Perform Grooming: with set-up;sitting Pt Will Perform Upper Body Bathing: with set-up;sitting Pt Will Perform Lower Body Dressing: with min assist;sit to/from stand Pt Will Transfer to Toilet: with min guard assist;bedside commode Additional ADL Goal #1: demonstrate emergent awareness during ADL task with min vc Additional ADL Goal #2: Pt will be  independent with HEP for bilateral UE's to increase strength.  Plan Discharge plan remains appropriate    Co-evaluation                 End of Session Equipment Utilized During Treatment: Gait belt   Activity Tolerance Patient tolerated treatment well   Patient Left in bed;with call bell/phone within reach   Nurse Communication          Time: 1430-1500 OT Time Calculation (min): 30 min  Charges: OT General Charges $OT Visit: 1 Procedure OT Treatments $Self Care/Home Management : 8-22 mins $Therapeutic Activity: 8-22 mins  Earlie RavelingStraub, Lindsey L OTR/L [phone] 04/06/2014, 3:56 PM

## 2014-04-07 ENCOUNTER — Non-Acute Institutional Stay (SKILLED_NURSING_FACILITY): Payer: Medicare Other | Admitting: Internal Medicine

## 2014-04-07 DIAGNOSIS — G40301 Generalized idiopathic epilepsy and epileptic syndromes, not intractable, with status epilepticus: Secondary | ICD-10-CM

## 2014-04-07 DIAGNOSIS — G40901 Epilepsy, unspecified, not intractable, with status epilepticus: Secondary | ICD-10-CM

## 2014-04-07 DIAGNOSIS — D509 Iron deficiency anemia, unspecified: Secondary | ICD-10-CM

## 2014-04-07 DIAGNOSIS — R6521 Severe sepsis with septic shock: Secondary | ICD-10-CM

## 2014-04-07 DIAGNOSIS — I214 Non-ST elevation (NSTEMI) myocardial infarction: Secondary | ICD-10-CM

## 2014-04-07 DIAGNOSIS — A419 Sepsis, unspecified organism: Secondary | ICD-10-CM

## 2014-04-07 DIAGNOSIS — I10 Essential (primary) hypertension: Secondary | ICD-10-CM

## 2014-04-07 DIAGNOSIS — E119 Type 2 diabetes mellitus without complications: Secondary | ICD-10-CM

## 2014-04-07 DIAGNOSIS — J9601 Acute respiratory failure with hypoxia: Secondary | ICD-10-CM

## 2014-04-07 LAB — GLUCOSE, CAPILLARY
Glucose-Capillary: 133 mg/dL — ABNORMAL HIGH (ref 70–99)
Glucose-Capillary: 210 mg/dL — ABNORMAL HIGH (ref 70–99)

## 2014-04-07 MED ORDER — ASPIRIN 81 MG PO CHEW: 81.0000 mg | CHEWABLE_TABLET | Freq: Every day | ORAL | Status: DC

## 2014-04-07 MED ORDER — FERROUS SULFATE 325 (65 FE) MG PO TABS: 325.0000 mg | ORAL_TABLET | Freq: Three times a day (TID) | ORAL | Status: DC

## 2014-04-07 MED ORDER — INSULIN GLARGINE 100 UNIT/ML ~~LOC~~ SOLN: 40.0000 [IU] | Freq: Every day | SUBCUTANEOUS | Status: DC

## 2014-04-07 MED ORDER — INSULIN ASPART 100 UNIT/ML ~~LOC~~ SOLN: SUBCUTANEOUS | Status: DC

## 2014-04-07 MED ORDER — TOPIRAMATE 25 MG PO TABS: 25.0000 mg | ORAL_TABLET | Freq: Every day | ORAL | Status: DC

## 2014-04-07 MED ORDER — LEVETIRACETAM 750 MG PO TABS: 1500.0000 mg | ORAL_TABLET | Freq: Two times a day (BID) | ORAL | Status: DC

## 2014-04-07 MED ORDER — CLOPIDOGREL BISULFATE 75 MG PO TABS: 75.0000 mg | ORAL_TABLET | Freq: Every day | ORAL | Status: DC

## 2014-04-07 MED ORDER — METOPROLOL TARTRATE 50 MG PO TABS: 50.0000 mg | ORAL_TABLET | Freq: Two times a day (BID) | ORAL | Status: DC

## 2014-04-07 MED ORDER — TOPIRAMATE 25 MG PO TABS
25.0000 mg | ORAL_TABLET | Freq: Every day | ORAL | Status: DC
  Administered 2014-04-07: 25 mg via ORAL
  Filled 2014-04-07: qty 1

## 2014-04-07 MED ORDER — METHYLPREDNISOLONE 4 MG PO KIT: PACK | ORAL | Status: DC

## 2014-04-07 MED ORDER — LACOSAMIDE 100 MG PO TABS: 100.0000 mg | ORAL_TABLET | Freq: Two times a day (BID) | ORAL | Status: DC

## 2014-04-07 MED ORDER — ATORVASTATIN CALCIUM 80 MG PO TABS: 80.0000 mg | ORAL_TABLET | Freq: Every day | ORAL | Status: DC

## 2014-04-07 MED ORDER — STARCH (THICKENING) PO POWD: ORAL | Status: DC

## 2014-04-07 MED ORDER — PHENYTOIN SODIUM EXTENDED 100 MG PO CAPS: 100.0000 mg | ORAL_CAPSULE | Freq: Three times a day (TID) | ORAL | Status: DC

## 2014-04-07 MED ORDER — LISINOPRIL 2.5 MG PO TABS: 2.5000 mg | ORAL_TABLET | Freq: Every day | ORAL | Status: DC

## 2014-04-07 NOTE — Progress Notes (Signed)
[facility] called to inform of DNR form to be shredded when pt reaches facility.  RN expresses understanding.   Brooke Gross, [name]

## 2014-04-07 NOTE — Progress Notes (Signed)
MRN: [id number] Name: Brooke Gross  Sex: female Age: 73 y.o. DOB: 05-May-1941  PSC #: [facility] Facility/Room: 120 Level Of Care: SNF Provider: Merrilee Seashore D Emergency Contacts: Extended Emergency Contact Information Primary Emergency Contact: Raoul Pitch [place] Home Phone: [phone] Relation: Sister Secondary Emergency Contact: Doran Heater [place] Mobile Phone: [phone] Relation: Son    Allergies: Review of patient's allergies indicates no known allergies.  Chief Complaint  Patient presents with  . New Admit To SNF    HPI: Patient is 73 y.o. female who was hospitalized for status epilepticus of worked up but unknown origin and NSTEMI who is admitted to SNF for OT/PT.  Past Medical History  Diagnosis Date  . Bipolar 1 disorder   . Diabetes   . Anxiety   . CKD (chronic kidney disease)   . Hypercholesteremia   . Migraine   . Sciatica   . Vertigo, labyrinthine   . Tremor   . Abnormality of gait 12/15/2013  . Obese   . NSTEMI, initial episode of care 03/18/2014  . Hypertension     Past Surgical History  Procedure Laterality Date  . Neck surgery  1990s  . Colonoscopy  2010  . Cholecsystectomy  1977  . Tonsillectomy    . Cataract extraction Bilateral   . Degenerative disc diease        Medication List       This list is accurate as of: 04/07/14 11:59 PM.  Always use your most recent med list.               aspirin 81 MG chewable tablet  Chew 1 tablet (81 mg total) by mouth daily.     atorvastatin 80 MG tablet  Commonly known as:  LIPITOR  Take 1 tablet (80 mg total) by mouth daily at 6 PM.     clopidogrel 75 MG tablet  Commonly known as:  PLAVIX  Take 1 tablet (75 mg total) by mouth daily.     ferrous sulfate 325 (65 FE) MG tablet  Take 1 tablet (325 mg total) by mouth 3 (three) times daily with meals.     food thickener Powd  Commonly known as:  THICK IT  To thicken all liquids      insulin aspart 100 UNIT/ML injection  Commonly known as:  novoLOG  - Before each meal 3 times a day, 140-199 - 2 units, 200-250 - 4 units, 251-299 - 6 units,  300-349 - 8 units,  350 or above 10 units.  - Insulin PEN if approved, provide syringes and needles if needed.     insulin glargine 100 UNIT/ML injection  Commonly known as:  LANTUS  Inject 0.4 mLs (40 Units total) into the skin daily.     Lacosamide 100 MG Tabs  Take 1 tablet (100 mg total) by mouth 2 (two) times daily.     levETIRAcetam 750 MG tablet  Commonly known as:  KEPPRA  Take 2 tablets (1,500 mg total) by mouth 2 (two) times daily.     lisinopril 2.5 MG tablet  Commonly known as:  PRINIVIL,ZESTRIL  Take 1 tablet (2.5 mg total) by mouth daily.     methylPREDNISolone 4 MG tablet  Commonly known as:  MEDROL DOSEPAK  follow package directions     metoprolol 50 MG tablet  Commonly known as:  LOPRESSOR  Take 1 tablet (50 mg total) by mouth 2 (two) times daily.     phenytoin 100 MG ER capsule  Commonly known as:  DILANTIN  Take 1 capsule (100 mg total) by mouth 3 (three) times daily.     topiramate 25 MG tablet  Commonly known as:  TOPAMAX  Take 1 tablet (25 mg total) by mouth daily. 1 more day and stop     venlafaxine XR 75 MG 24 hr capsule  Commonly known as:  EFFEXOR-XR  Take 75 mg by mouth daily with breakfast.        No orders of the defined types were placed in this encounter.     There is no immunization history on file for this patient.  History  Substance Use Topics  . Smoking status: Former Smoker -- 0.50 packs/day for 15 years    Types: Cigarettes  . Smokeless tobacco: Never Used     Comment: quit 1977  . Alcohol Use: Yes     Comment: occasional    Family history is noncontributory    Review of Systems  DATA OBTAINED: from patient GENERAL:  no fevers, fatigue, appetite changes SKIN: No itching, rash or wounds EYES: No eye pain, redness, discharge EARS: No earache, tinnitus,  change in hearing NOSE: No congestion, drainage or bleeding  MOUTH/THROAT: No mouth or tooth pain, No sore throat RESPIRATORY: No cough, wheezing, SOB CARDIAC: No chest pain, palpitations, lower extremity edema  GI: No abdominal pain, No N/V/D or constipation, No heartburn or reflux  GU: No dysuria, frequency or urgency, or incontinence  MUSCULOSKELETAL: No unrelieved bone/joint pain NEUROLOGIC: No headache, dizziness or focal weakness PSYCHIATRIC: No overt anxiety or sadness, No behavior issue.   Filed Vitals:   04/07/14 2215  BP: 123/59  Pulse: 75  Temp: 97.6 F (36.4 C)  Resp: 18    Physical Exam  GENERAL APPEARANCE: Alert, conversant,  No acute distress.  SKIN: No diaphoresis rash HEAD: Normocephalic, atraumatic  EYES: Conjunctiva/lids clear. Pupils round, reactive. EOMs intact.  EARS: External exam WNL, canals clear. Hearing grossly normal.  NOSE: No deformity or discharge.  MOUTH/THROAT: Lips w/o lesions  RESPIRATORY: Breathing is even, unlabored. Lung sounds are clear   CARDIOVASCULAR: Heart RRR no murmurs, rubs or gallops. No peripheral edema.   GASTROINTESTINAL: Abdomen is soft, non-tender, not distended w/ normal bowel sounds. GENITOURINARY: Bladder non tender, not distended  MUSCULOSKELETAL: No abnormal joints or musculature NEUROLOGIC:  Cranial nerves 2-12 grossly intact. Moves all extremities  PSYCHIATRIC: slt odd affect, no behavioral issues  Patient Active Problem List   Diagnosis Date Noted  . Acute respiratory failure with hypoxia 04/13/2014  . Severe sepsis with septic shock 04/13/2014  . Anemia, iron deficiency 04/13/2014  . Essential hypertension   . Status epilepticus 03/18/2014  . Non-ST elevation myocardial infarction (NSTEMI), subsequent episode of care 03/18/2014  . Diabetes mellitus type 2, controlled, without complications   . Abnormality of gait 12/15/2013    CBC    Component Value Date/Time   WBC 9.5 03/31/2014 0530   RBC 3.29*  03/31/2014 0530   HGB 9.1* 03/31/2014 0530   HCT 29.1* 03/31/2014 0530   PLT 639* 03/31/2014 0530   MCV 88.4 03/31/2014 0530   LYMPHSABS 1.7 03/23/2014 1200   MONOABS 1.3* 03/23/2014 1200   EOSABS 0.3 03/23/2014 1200   BASOSABS 0.0 03/23/2014 1200    CMP     Component Value Date/Time   NA 143 04/02/2014 0500   K 3.7 04/02/2014 0500   CL 110 04/02/2014 0500   CO2 21 04/02/2014 0500   GLUCOSE 132* 04/02/2014 0500   BUN 19 04/02/2014 0500  CREATININE 0.68 04/02/2014 0500   CALCIUM 9.2 04/02/2014 0500   PROT 5.7* 03/21/2014 0313   ALBUMIN 2.1* 03/21/2014 0313   AST 48* 03/21/2014 0313   ALT 58* 03/21/2014 0313   ALKPHOS 61 03/21/2014 0313   BILITOT <0.2* 03/21/2014 0313   GFRNONAA 86* 04/02/2014 0500   GFRAA >90 04/02/2014 0500    Assessment and Plan  Status epilepticus CT and MRI brain unremarkable. LP unremarkable. Neurology following. Last EEG stable. Seizures controlled on dilantin, keppra, vimpat, topamax/decreased; seems to have improved on prednisone taper which will be continued, discussed with neurology. Taper off Topamax with last dose tomorrow. Discussed with neurology. Outpatient neurology followup in one week   Non-ST elevation myocardial infarction (NSTEMI), subsequent episode of care LVEF 50 to 55% with inferior/inferoseptal hypokinesis -she is symptom free and stable on BB, ASA, Plavix, Statin; Lexi scan shows no reversible ischemia. Continue medical treatment per cardiology. We'll follow with cardiology in one to 2 weeks   Acute respiratory failure with hypoxia due to aspiration pneumonia; initially intubated/extubated 10/12; stable not requiring oxygen   Severe sepsis with septic shock CXR: showed possible pneumonia; sepsis resolved -off pressors; patient completed IV atx; afebrile; blood cultures NGTD; leukocytosis resolved;    Diabetes mellitus type 2, controlled, without complications [id number]; cont Lantus and sliding-scale, monitor CBGs q. a.c. at bedtime  adjust insulin as needed   Anemia, iron deficiency no obvious s/s of acute bleeding; Start PO iron; outpatient GI followup for iron deficiency anemia as age appropriate   Essential hypertension Cont lopressor, lisinovil    Margit HanksALEXANDER, ANNE D, MD

## 2014-04-07 NOTE — Discharge Instructions (Signed)
Follow with Primary MD Estanislado PandySASSER,PAUL W, MD in 7 days   Get CBC, CMP, 2 view Chest X ray checked  by Primary MD next visit.    Activity: As tolerated with Full fall precautions use walker/cane & assistance as needed  Do not drive, operating heavy machinery, perform activities at heights, swimming or participation in water activities or provide baby sitting services until you have seen by Primary MD or a Neurologist and advised to do so again.  Disposition SNF   Diet: Dysphagia 3 Nectar thick liwuids with feeding assistance and aspiration precautions as needed.  For Heart failure patients - Check your Weight same time everyday, if you gain over 2 pounds, or you develop in leg swelling, experience more shortness of breath or chest pain, call your Primary MD immediately. Follow Cardiac Low Salt Diet and 1.8 lit/day fluid restriction.   On your next visit with your primary care physician please Get Medicines reviewed and adjusted.   Please request your Prim.MD to go over all Hospital Tests and Procedure/Radiological results at the follow up, please get all Hospital records sent to your Prim MD by signing hospital release before you go home.   If you experience worsening of your admission symptoms, develop shortness of breath, life threatening emergency, suicidal or homicidal thoughts you must seek medical attention immediately by calling 911 or calling your MD immediately  if symptoms less severe.  You Must read complete instructions/literature along with all the possible adverse reactions/side effects for all the Medicines you take and that have been prescribed to you. Take any new Medicines after you have completely understood and accpet all the possible adverse reactions/side effects.     Do not drive when taking Pain medications.    Do not take more than prescribed Pain, Sleep and Anxiety Medications  Special Instructions: If you have smoked or chewed Tobacco  in the last 2 yrs please  stop smoking, stop any regular Alcohol  and or any Recreational drug use.  Wear Seat belts while driving.   Please note  You were cared for by a hospitalist during your hospital stay. If you have any questions about your discharge medications or the care you received while you were in the hospital after you are discharged, you can call the unit and asked to speak with the hospitalist on call if the hospitalist that took care of you is not available. Once you are discharged, your primary care physician will handle any further medical issues. Please note that NO REFILLS for any discharge medications will be authorized once you are discharged, as it is imperative that you return to your primary care physician (or establish a relationship with a primary care physician if you do not have one) for your aftercare needs so that they can reassess your need for medications and monitor your lab values.

## 2014-04-07 NOTE — Progress Notes (Signed)
Inpatient Diabetes Program Recommendations  AACE/ADA: New Consensus Statement on Inpatient Glycemic Control (2013)  Target Ranges:  Prepandial:   less than 140 mg/dL      Peak postprandial:   less than 180 mg/dL (1-2 hours)      Critically ill patients:  140 - 180 mg/dL   Results for Brooke Gross, Brooke Gross (MRN [id number]) as of 04/07/2014 09:09  Ref. Range 04/06/2014 07:29 04/06/2014 12:19 04/06/2014 17:04 04/06/2014 21:24 04/07/2014 07:36  Glucose-Capillary Latest Range: 70-99 mg/dL 562109 (H) 130240 (H) 865188 (H) 189 (H) 133 (H)   Diabetes history: DM2 Outpatient Diabetes medications: Metformin 1000 mg BID Current orders for Inpatient glycemic control: Lantus 40 units daily, Novolog 0-15 units TID with meals, Novolog 0-5 units HS   Inpatient Diabetes Program Recommendations Insulin - Meal Coverage: While ordered steroids, please consider ordering Novolog meal coverage 3 units TID with meals (to be held if patient eats less than 50%).  Thanks, Orlando PennerMarie Byrd, RN, MSN, CCRN Diabetes Coordinator Inpatient Diabetes Program [phone] (Team Pager) [phone] ([facility] office) [phone] [facility] office)

## 2014-04-07 NOTE — Discharge Summary (Signed)
Brooke Gross, is a 73 y.o. female  DOB 1941/03/30  MRN [id number].  Admission date:  03/18/2014  Admitting Physician  Nelda Bucksaniel J Feinstein, MD  Discharge Date:  04/07/2014   Primary MD  Estanislado PandySASSER,PAUL W, MD  Recommendations for primary care physician for things to follow:   Monitor CBGs closely, check CBC BMP in 3-4 days, must follow with recommended neurology and cardiology group within 1 to 2 weeks.   Admission Diagnosis  SEIZURE ACTIVITY AND CARDIAC SXS, ELEV TROPONIN, ST DEPRESSION   Discharge Diagnosis  SEIZURE ACTIVITY AND CARDIAC SXS, ELEV TROPONIN, ST DEPRESSION     Principal Problem:   Status epilepticus Active Problems:   NSTEMI, initial episode of care   Diabetes      Past Medical History  Diagnosis Date  . Bipolar 1 disorder   . Diabetes   . Anxiety   . CKD (chronic kidney disease)   . Hypercholesteremia   . Migraine   . Sciatica   . Vertigo, labyrinthine   . Tremor   . Abnormality of gait 12/15/2013  . Obese   . Hypertension   . NSTEMI, initial episode of care 03/18/2014    Past Surgical History  Procedure Laterality Date  . Neck surgery  1990s  . Colonoscopy  2010  . Cholecsystectomy  1977  . Tonsillectomy    . Cataract extraction Bilateral   . Degenerative disc diease         History of present illness and  Hospital Course:     Kindly see H&P for history of present illness and admission details, please review complete Labs, Consult reports and Test reports for all details in brief  HPI  73 y/o female with PMH of Bipolar DO, DM, CKD, HLD, HTN, chronic back pains, obesity [facility] with status epilepticus and NSTEMI on 10/2. Pt initially [facility]: She had several seizures en route to ER and while in ER. She was also noted to have ECG changes and elevated troponin.  [facility]. Pt was intubated for airway protection, started on antiepileptics, sedated and admitted to ICU; 10/12 Pt extubated seizure controlled on multiple medication regimen, Patient is still delirious      Hospital Course     1. New onset refractory status epilepticus; mentation gradually improving. CT and MRI brain unremarkable. LP unremarkable. Neurology following. Last EEG stable. Seizures controlled on dilantin, keppra, vimpat, topamax/decreased; seems to have improved on prednisone taper which will be continued, discussed with neurology. Taper off Topamax with last dose tomorrow. Discussed with neurology. Outpatient neurology followup in one week.   2. NSTEMI; Echo (10/2): LVEF 50 to 55% with inferior/inferoseptal hypokinesis -she is symptom free and stable on BB, ASA, Plavix, Statin; Lexi scan shows no reversible ischemia. Continue medical treatment per cardiology. We'll follow with cardiology in one to 2 weeks.   3. Acute hypoxic respiratory failure due to aspiration pneumonia; initially intubated/extubated 10/12; stable not requiring oxygen.   4. Sepsis/septic shock/pnuemonia; CXR: showed possible pneumonia; sepsis resolved -  off pressors; patient completed IV atx; afebrile; blood cultures NGTD; leukocytosis resolved;    5. AKI likely due to ATN/sepsis on admission; resolved with IVF;    6. DM on insulin; ha1c-7.6; cont Lantus and sliding-scale, monitor CBGs q. a.c. at bedtime adjust insulin as needed.   7. Anemia IDA; no obvious s/s of acute bleeding; Start PO iron; outpatient GI followup for iron deficiency anemia as age appropriate.   8. HTN able on present regimen.    Discharge Condition: stable   Follow UP  Follow-up Information   Follow up with Estanislado Pandy, MD. Schedule an appointment as soon as possible for a visit in 1 week.   Specialty:  Family Medicine   Contact information:   [address] [place] [address] [phone]       Follow  up with [facility]. Schedule an appointment as soon as possible for a visit in 1 week.   Contact information:   [address] [place] [address] [phone]      Follow up with Lennette Bihari, MD. (The office will call.)    Specialty:  Cardiology   Contact information:   [address] [place] [address] [phone]         Discharge Instructions  and  Discharge Medications          Discharge Instructions   Discharge instructions    Complete by:  As directed   Follow with Primary MD Estanislado Pandy, MD in 7 days   Get CBC, CMP, 2 view Chest X ray checked  by Primary MD next visit.    Activity: As tolerated with Full fall precautions use walker/cane & assistance as needed  Do not drive, operating heavy machinery, perform activities at heights, swimming or participation in water activities or provide baby sitting services until you have seen by Primary MD or a Neurologist and advised to do so again.  Disposition SNF   Diet: Dysphagia 3 Nectar thick liwuids with feeding assistance and aspiration precautions as needed.  For Heart failure patients - Check your Weight same time everyday, if you gain over 2 pounds, or you develop in leg swelling, experience more shortness of breath or chest pain, call your Primary MD immediately. Follow Cardiac Low Salt Diet and 1.8 lit/day fluid restriction.   On your next visit with your primary care physician please Get Medicines reviewed and adjusted.   Please request your Prim.MD to go over all Hospital Tests and Procedure/Radiological results at the follow up, please get all Hospital records sent to your Prim MD by signing hospital release before you go home.   If you experience worsening of your admission symptoms, develop shortness of breath, life threatening emergency, suicidal or homicidal thoughts you must seek medical attention immediately by calling 911 or calling your MD  immediately  if symptoms less severe.  You Must read complete instructions/literature along with all the possible adverse reactions/side effects for all the Medicines you take and that have been prescribed to you. Take any new Medicines after you have completely understood and accpet all the possible adverse reactions/side effects.     Do not drive when taking Pain medications.    Do not take more than prescribed Pain, Sleep and Anxiety Medications  Special Instructions: If you have smoked or chewed Tobacco  in the last 2 yrs please stop smoking, stop any regular Alcohol  and or any Recreational drug use.  Wear Seat belts while driving.  Please note  You were cared for by a hospitalist during your hospital stay. If you have any questions about your discharge medications or the care you received while you were in the hospital after you are discharged, you can call the unit and asked to speak with the hospitalist on call if the hospitalist that took care of you is not available. Once you are discharged, your primary care physician will handle any further medical issues. Please note that NO REFILLS for any discharge medications will be authorized once you are discharged, as it is imperative that you return to your primary care physician (or establish a relationship with a primary care physician if you do not have one) for your aftercare needs so that they can reassess your need for medications and monitor your lab values.     Increase activity slowly    Complete by:  As directed             Medication List    STOP taking these medications       cyclobenzaprine 10 MG tablet  Commonly known as:  FLEXERIL     metFORMIN 1000 MG tablet  Commonly known as:  GLUCOPHAGE     pravastatin 20 MG tablet  Commonly known as:  PRAVACHOL      TAKE these medications       aspirin 81 MG chewable tablet  Chew 1 tablet (81 mg total) by mouth daily.     atorvastatin 80 MG tablet  Commonly known  as:  LIPITOR  Take 1 tablet (80 mg total) by mouth daily at 6 PM.     clopidogrel 75 MG tablet  Commonly known as:  PLAVIX  Take 1 tablet (75 mg total) by mouth daily.     ferrous sulfate 325 (65 FE) MG tablet  Take 1 tablet (325 mg total) by mouth 3 (three) times daily with meals.     food thickener Powd  Commonly known as:  THICK IT  To thicken all liquids     insulin aspart 100 UNIT/ML injection  Commonly known as:  novoLOG  - Before each meal 3 times a day, 140-199 - 2 units, 200-250 - 4 units, 251-299 - 6 units,  300-349 - 8 units,  350 or above 10 units.  - Insulin PEN if approved, provide syringes and needles if needed.     insulin glargine 100 UNIT/ML injection  Commonly known as:  LANTUS  Inject 0.4 mLs (40 Units total) into the skin daily.     Lacosamide 100 MG Tabs  Take 1 tablet (100 mg total) by mouth 2 (two) times daily.     levETIRAcetam 750 MG tablet  Commonly known as:  KEPPRA  Take 2 tablets (1,500 mg total) by mouth 2 (two) times daily.     lisinopril 2.5 MG tablet  Commonly known as:  PRINIVIL,ZESTRIL  Take 1 tablet (2.5 mg total) by mouth daily.     methylPREDNISolone 4 MG tablet  Commonly known as:  MEDROL DOSEPAK  follow package directions     metoprolol 50 MG tablet  Commonly known as:  LOPRESSOR  Take 1 tablet (50 mg total) by mouth 2 (two) times daily.     phenytoin 100 MG ER capsule  Commonly known as:  DILANTIN  Take 1 capsule (100 mg total) by mouth 3 (three) times daily.     topiramate 25 MG tablet  Commonly known as:  TOPAMAX  Take 1 tablet (25 mg total) by mouth daily. 1  more day and stop     venlafaxine XR 75 MG 24 hr capsule  Commonly known as:  EFFEXOR-XR  Take 75 mg by mouth daily with breakfast.          Diet and Activity recommendation: See Discharge Instructions above   Consults obtained - Cards, Neuro, PCCM   Major procedures and Radiology Reports - PLEASE review detailed and final reports for all details, in  brief -   EEG x 2 , last EEG stable   Ct Head Wo Contrast  03/21/2014   CLINICAL DATA:  Seizure  EXAM: CT HEAD WITHOUT CONTRAST  TECHNIQUE: Contiguous axial images were obtained from the base of the skull through the vertex without intravenous contrast.  COMPARISON:  03/20/2014  FINDINGS: Ventricle size is normal. Mild chronic microvascular ischemic change in the white matter.  Negative for acute infarct. Negative for hemorrhage or mass. Calvarium intact.  IMPRESSION: No acute abnormality.   Electronically Signed   By: Marlan Palau M.D.   On: 03/21/2014 12:27   Ct Head Wo Contrast  03/20/2014   CLINICAL DATA:  Status epilepticus. History of bipolar 1 disorder, diabetes, and migraine.  EXAM: CT HEAD WITHOUT CONTRAST  TECHNIQUE: Contiguous axial images were obtained from the base of the skull through the vertex without contrast.  COMPARISON:  MRI of the head performed 12/22/2013 at [facility].  FINDINGS: No evidence for acute infarction, hemorrhage, mass lesion, hydrocephalus, or extra-axial fluid. Generalized atrophy. Hypoattenuation of the white matter consistent with previously identified small vessel disease. Calvarium intact. Vascular calcification. No acute sinus disease. Trace layering effusion in the LEFT mastoid. Slight RIGHT frontal scalp soft tissue swelling was present on the previous MRI, and could represent a chronic inflammatory process.  IMPRESSION: Chronic changes as described similar to previous MR. no acute intracranial abnormality is detected.   Electronically Signed   By: Davonna Belling M.D.   On: 03/20/2014 09:19   Mr Brain Wo Contrast  03/26/2014   CLINICAL DATA:  Initial evaluation for seizure and altered mental status. [facility] with status epilepticus and NSTEMI.  EXAM: MRI HEAD WITHOUT CONTRAST  TECHNIQUE: Multiplanar, multiecho pulse sequences of the brain and surrounding structures were obtained without intravenous contrast.  COMPARISON:  Prior  CT from 03/21/2014  FINDINGS: Mild generalized atrophy noted. Mild small vessel changes again seen. No mass lesion, midline shift, or extra-axial fluid collection. Ventricles are normal in size without evidence of hydrocephalus. Hippocampi are symmetric in size and morphology bilaterally without signal abnormality.  No diffusion-weighted signal abnormality is identified to suggest acute intracranial infarct. Gray-white matter differentiation is maintained. Normal flow voids are seen within the intracranial vasculature. No intracranial hemorrhage identified.  The cervicomedullary junction is normal. Pituitary gland is within normal limits. Pituitary stalk is midline. The globes and optic nerves demonstrate a normal appearance with normal signal intensity.  The bone marrow signal intensity is normal. Calvarium is intact. Visualized upper cervical spine is within normal limits.  Scalp soft tissues are unremarkable.  Mild mucoperiosteal thickening present within the ethmoidal air cells bilaterally. Concha bullosa noted. Layering fluid density within the posterior nasopharynx likely related to intubation. Bilateral mastoid effusions present.  IMPRESSION: 1. No acute intracranial infarct or other abnormality identified. 2. Mild chronic microvascular ischemic changes. 3. Bilateral mastoid effusions.   Electronically Signed   By: Rise Mu M.D.   On: 03/26/2014 01:15   Nm Myocar Multi W/spect W/wall Motion / Ef  04/05/2014   CLINICAL DATA:  Chest pain  EXAM: Lexiscan Myovue  TECHNIQUE: The patient received IV Lexiscan .4mg  over 15 seconds. 33.0 mCi of Technetium 37m Sestamibi injected at 30 seconds. Quantitative SPECT images were obtained in the vertical, horizontal and short axis planes after a 45 minute delay. Rest images were obtained with similar planes and delay using 10.2 mCi of Technetium 72m Sestamibi.  FINDINGS: ECG:  NSR normal ECG no changes with infusion  Symptoms:  Sleepy  RAW Data: Normal  QPS:   SDS nonspecific at anterior base  Quantitative Gated SPECT EF: 63%  Perfusion Images: Normal  IMPRESSION: Normal lexiscan myovue with no evidence of ischemia or infarct EF 63%  Charlton Haws   Electronically Signed   By: Charlton Haws M.D.   On: 04/05/2014 13:48   Dg Chest Port 1 View  03/29/2014   CLINICAL DATA:  Intubation.  EXAM: PORTABLE CHEST - 1 VIEW  COMPARISON:  03/28/2014.  FINDINGS: Endotracheal tube and NG tube in good anatomic position. Mild infiltrates in both lower lobes cannot be excluded. No pleural effusion or pneumothorax. Mild cardiomegaly with mild pulmonary vascular prominence again noted. A component of congestive heart failure cannot be excluded. No acute bony abnormality.  IMPRESSION: 1. Lines and tubes in stable position. 2. Persistent mild cardiomegaly and pulmonary vascular prominence. Mild basilar pulmonary infiltrates noted. CHF cannot be excluded. Basilar pneumonia cannot be excluded.   Electronically Signed   By: Maisie Fus  Register   On: 03/29/2014 07:29       Dg Abd Portable 1v  03/30/2014   CLINICAL DATA:  Feeding tube placement.  Stroke.  EXAM: PORTABLE ABDOMEN - 1 VIEW  COMPARISON:  03/26/2014  FINDINGS: NG tube has been removed. Feeding tube has been placed with the tip in the mid body of the stomach. Normal bowel gas pattern.  IMPRESSION: Feeding tube tip in the mid body of the stomach.   Electronically Signed   By: Marlan Palau M.D.   On: 03/30/2014 09:54     Procedures:  10/02 Transfer to [facility], intubated shortly after arrival. Dopamine initiated for hypotension. Vanc, acyclovir, ceftriaxone and ampicillin initiated empirically  10/02 CT head [facility]): no acute abn  10/02 Neuro consult: Etiology of seizure activity is unclear. Acute ischemic cerebral infarction cannot be ruled out.  Recommendations: Continue Keppra IV at 1000 mg every 12 hours. Continue Vimpat at 100 mg every 12 hours. Propofol IV for sedation as well as as seizure control. EEG to rule out  continued subclinical seizure activity. MRI of the brain without contrast to rule out acute cerebral infarction.  10/2 echogram with EF of 50% but diffuse wall motion abnormality.  10/02 Cards consult: Her biomarkers suggest a fairly large MI and ECG has many high risk features. 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 ASA, statin, heparin initiated. Beta blocker not option due to hypotension.  10/02 TTE: LVEF is approximately 50 to 55% with inferior/inferoseptal hypokinesis, grade 1 diastolic dysfx  10/03 No WUA. Remained on dopamine  10/03 Persistent seizure-like activity despite mutliple anti-consvulsants. Burst suppression initiated. LP recommended to R/O infectious etiology  10/03 cont EEG: Burst suppression, asymmetric, with greater suppression on the right. Semi-periodic epileptiform discharges, left hemisphere, maximal over the mid/posterior temporal region. The findings indicate a severe diffuse encephalopathy with epileptogenic potential noted over the left hemisphere, particularly over the left posterior temporal-occipital region. No ongoing seizure activity was observed that would explain an altered mental status  10/04 Remained in medically induced coma and on dopamine.  10/04 LP performed by  Neuro. CSF: glu 123, protein 30, RBC 3, WBC 8, 52% lymphs  10/05 Unable to tolerated decrease in propofol due to tachypnea. Ketamine initiated. Propofol weaned to off. DA transitioned to NE. Anti-bacterial abx discontinued. Acyclovir continued  10/05 CT head: no acute abn  10/06 Low grade fever. Resp cx and C diff PCR ordered. Ketamine stopped. Midaz gtt @ 10 mg/hr. No overt seizures. Continuous EEG  10/08 No further seizure. Remained encephalopathic. Tachypneic on WUA/SBT. Nearly off vasopressors. Remained severely volume overloaded. Lasix ordered  10/09 Continuous EEG discontinued. Tachypneic and excessive coughing when dexmedetomidine gtt discontinued. Son updated in detail @ bedside. He  indicated that he would not want to pursue trach tube and long term care if neuro prognosis for functional recovery is not good  10/09 MRI brain >> negative  10/10 opening eyes to command  10/12 extubated  10/13 made LCB with no CPR/cardioversion/intubation, refused tracheostomy.  10/20. EEG unremarkable.  10/21. Lexi scan showing no reversible ischemia.     Micro Results      Recent Results (from the past 240 hour(s))  CLOSTRIDIUM DIFFICILE BY PCR     Status: None   Collection Time    04/03/14  2:14 AM      Result Value Ref Range Status   C difficile by pcr NEGATIVE  NEGATIVE Final  OVA AND PARASITE EXAMINATION     Status: None   Collection Time    04/04/14 12:59 AM      Result Value Ref Range Status   Specimen Description STOOL   Final   Special Requests NONE   Final   Ova and parasites     Final   Value: NO OVA OR PARASITES SEEN     Performed at [facility]   Report Status 04/05/2014 FINAL   Final       Today   Subjective:   Brooke Gross today has no headache,no chest abdominal pain,no new weakness tingling or numbness, feels much better.  Objective:   Blood pressure 121/45, pulse 70, temperature 98.1 F (36.7 C), temperature source Oral, resp. rate 18, height 5' 0.63" (1.54 m), weight 90.9 kg (200 lb 6.4 oz), SpO2 100.00%.   Intake/Output Summary (Last 24 hours) at 04/07/14 1046 Last data filed at 04/07/14 0920  Gross per 24 hour  Intake    390 ml  Output      0 ml  Net    390 ml    Exam Awake Alert, Oriented x 3, No new F.N deficits, Normal affect Yaak.AT,PERRAL Supple Neck,No JVD, No cervical lymphadenopathy appriciated.  Symmetrical Chest wall movement, Good air movement bilaterally, CTAB RRR,No Gallops,Rubs or new Murmurs, No Parasternal Heave +ve B.Sounds, Abd Soft, Non tender, No organomegaly appriciated, No rebound -guarding or rigidity. No Cyanosis, Clubbing or edema, No new Rash or bruise  Data Review   CBC w Diff: Lab Results   Component Value Date   WBC 9.5 03/31/2014   HGB 9.1* 03/31/2014   HCT 29.1* 03/31/2014   PLT 639* 03/31/2014   LYMPHOPCT 12 03/23/2014   MONOPCT 9 03/23/2014   EOSPCT 2 03/23/2014   BASOPCT 0 03/23/2014    CMP: Lab Results  Component Value Date   NA 143 04/02/2014   K 3.7 04/02/2014   CL 110 04/02/2014   CO2 21 04/02/2014   BUN 19 04/02/2014   CREATININE 0.68 04/02/2014   PROT 5.7* 03/21/2014   ALBUMIN 2.1* 03/21/2014   BILITOT <0.2* 03/21/2014   ALKPHOS 61 03/21/2014  AST 48* 03/21/2014   ALT 58* 03/21/2014  .   Total Time in preparing paper work, data evaluation and todays exam - 35 minutes  Leroy Sea M.D on 04/07/2014 at 10:46 AM  [facility] Office  [phone]

## 2014-04-07 NOTE — Progress Notes (Signed)
PICC D/C per MD order, 36cm intact, vaseline pressure dsg applied, pressure x5 min, no bleeding noted. Ronette DeterJessica Poff, RN

## 2014-04-07 NOTE — Progress Notes (Signed)
Report called to Seward MethVondra, Charity fundraiserN at [facility].  All questions answered and instructed to call back if further questions arise. Vanice Sarahhompson, [name]

## 2014-04-07 NOTE — Clinical Social Work Note (Signed)
[facility] Anticipated discharge date: 04/07/14 Family notified: Lynford Humphreyllan Robertson, son [facility] at 1:45pm  CSW spoke with son concerning patients partial code for the [facility] transport- if [facility] does not accept partial code paperwork patients son would prefer DNR for the ambulance ride only.   CSW signing off.  Merlyn LotJenna Holoman, LCSWA Clinical Social Worker [phone]

## 2014-04-08 LAB — MISCELLANEOUS TEST

## 2014-04-11 LAB — MISCELLANEOUS TEST

## 2014-04-12 LAB — MISCELLANEOUS TEST

## 2014-04-13 ENCOUNTER — Encounter: Payer: Self-pay | Admitting: Internal Medicine

## 2014-04-13 DIAGNOSIS — A419 Sepsis, unspecified organism: Secondary | ICD-10-CM | POA: Insufficient documentation

## 2014-04-13 DIAGNOSIS — I10 Essential (primary) hypertension: Secondary | ICD-10-CM | POA: Insufficient documentation

## 2014-04-13 DIAGNOSIS — J9601 Acute respiratory failure with hypoxia: Secondary | ICD-10-CM | POA: Insufficient documentation

## 2014-04-13 DIAGNOSIS — D509 Iron deficiency anemia, unspecified: Secondary | ICD-10-CM | POA: Insufficient documentation

## 2014-04-13 DIAGNOSIS — R6521 Severe sepsis with septic shock: Secondary | ICD-10-CM

## 2014-04-13 LAB — MISCELLANEOUS TEST

## 2014-04-13 NOTE — Assessment & Plan Note (Signed)
ha1c-7.6; cont Lantus and sliding-scale, monitor CBGs q. a.c. at bedtime adjust insulin as needed

## 2014-04-13 NOTE — Assessment & Plan Note (Signed)
no obvious s/s of acute bleeding; Start PO iron; outpatient GI followup for iron deficiency anemia as age appropriate

## 2014-04-13 NOTE — Assessment & Plan Note (Signed)
Cont lopressor, lisinovil

## 2014-04-13 NOTE — Assessment & Plan Note (Signed)
LVEF 50 to 55% with inferior/inferoseptal hypokinesis -she is symptom free and stable on BB, ASA, Plavix, Statin; Lexi scan shows no reversible ischemia. Continue medical treatment per cardiology. We'll follow with cardiology in one to 2 weeks

## 2014-04-13 NOTE — Assessment & Plan Note (Signed)
CXR: showed possible pneumonia; sepsis resolved -off pressors; patient completed IV atx; afebrile; blood cultures NGTD; leukocytosis resolved;

## 2014-04-13 NOTE — Assessment & Plan Note (Signed)
CT and MRI brain unremarkable. LP unremarkable. Neurology following. Last EEG stable. Seizures controlled on dilantin, keppra, vimpat, topamax/decreased; seems to have improved on prednisone taper which will be continued, discussed with neurology. Taper off Topamax with last dose tomorrow. Discussed with neurology. Outpatient neurology followup in one week

## 2014-04-13 NOTE — Assessment & Plan Note (Signed)
due to aspiration pneumonia; initially intubated/extubated 10/12; stable not requiring oxygen

## 2014-04-18 DIAGNOSIS — G40301 Generalized idiopathic epilepsy and epileptic syndromes, not intractable, with status epilepticus: Secondary | ICD-10-CM

## 2014-04-18 LAB — FUNGUS CULTURE W SMEAR
Fungal Smear: NONE SEEN
Special Requests: NORMAL

## 2014-04-19 ENCOUNTER — Telehealth: Payer: Self-pay | Admitting: Neurology

## 2014-04-19 ENCOUNTER — Ambulatory Visit: Payer: Medicare Other | Admitting: Cardiovascular Disease

## 2014-04-19 ENCOUNTER — Ambulatory Visit: Payer: Medicare Other | Admitting: Neurology

## 2014-04-19 NOTE — Telephone Encounter (Signed)
This patient did not show for a revisit appointment today. 

## 2014-04-20 ENCOUNTER — Encounter: Payer: Self-pay | Admitting: *Deleted

## 2014-04-22 ENCOUNTER — Encounter (HOSPITAL_COMMUNITY): Payer: Self-pay | Admitting: Emergency Medicine

## 2014-04-22 ENCOUNTER — Emergency Department (HOSPITAL_COMMUNITY): Payer: Medicare Other

## 2014-04-22 ENCOUNTER — Emergency Department (HOSPITAL_COMMUNITY)
Admission: EM | Admit: 2014-04-22 | Discharge: 2014-04-22 | Disposition: A | Discharge status: Home / Self Care | Payer: Medicare Other | Attending: Emergency Medicine | Admitting: Emergency Medicine

## 2014-04-22 DIAGNOSIS — Z79899 Other long term (current) drug therapy: Secondary | ICD-10-CM | POA: Insufficient documentation

## 2014-04-22 DIAGNOSIS — G43909 Migraine, unspecified, not intractable, without status migrainosus: Secondary | ICD-10-CM | POA: Insufficient documentation

## 2014-04-22 DIAGNOSIS — F419 Anxiety disorder, unspecified: Secondary | ICD-10-CM | POA: Diagnosis not present

## 2014-04-22 DIAGNOSIS — R110 Nausea: Secondary | ICD-10-CM | POA: Diagnosis not present

## 2014-04-22 DIAGNOSIS — F32A Depression, unspecified: Secondary | ICD-10-CM | POA: Diagnosis present

## 2014-04-22 DIAGNOSIS — I252 Old myocardial infarction: Secondary | ICD-10-CM | POA: Diagnosis not present

## 2014-04-22 DIAGNOSIS — R7889 Finding of other specified substances, not normally found in blood: Secondary | ICD-10-CM

## 2014-04-22 DIAGNOSIS — Z87891 Personal history of nicotine dependence: Secondary | ICD-10-CM | POA: Diagnosis not present

## 2014-04-22 DIAGNOSIS — G40909 Epilepsy, unspecified, not intractable, without status epilepticus: Secondary | ICD-10-CM

## 2014-04-22 DIAGNOSIS — M5430 Sciatica, unspecified side: Secondary | ICD-10-CM | POA: Diagnosis not present

## 2014-04-22 DIAGNOSIS — Z7902 Long term (current) use of antithrombotics/antiplatelets: Secondary | ICD-10-CM | POA: Diagnosis not present

## 2014-04-22 DIAGNOSIS — R791 Abnormal coagulation profile: Secondary | ICD-10-CM | POA: Insufficient documentation

## 2014-04-22 DIAGNOSIS — E119 Type 2 diabetes mellitus without complications: Secondary | ICD-10-CM | POA: Diagnosis not present

## 2014-04-22 DIAGNOSIS — N189 Chronic kidney disease, unspecified: Secondary | ICD-10-CM | POA: Diagnosis not present

## 2014-04-22 DIAGNOSIS — Z7982 Long term (current) use of aspirin: Secondary | ICD-10-CM | POA: Diagnosis not present

## 2014-04-22 DIAGNOSIS — E780 Pure hypercholesterolemia: Secondary | ICD-10-CM | POA: Diagnosis not present

## 2014-04-22 DIAGNOSIS — R569 Unspecified convulsions: Secondary | ICD-10-CM

## 2014-04-22 DIAGNOSIS — F418 Other specified anxiety disorders: Secondary | ICD-10-CM

## 2014-04-22 DIAGNOSIS — E669 Obesity, unspecified: Secondary | ICD-10-CM | POA: Insufficient documentation

## 2014-04-22 DIAGNOSIS — I129 Hypertensive chronic kidney disease with stage 1 through stage 4 chronic kidney disease, or unspecified chronic kidney disease: Secondary | ICD-10-CM | POA: Insufficient documentation

## 2014-04-22 DIAGNOSIS — Z794 Long term (current) use of insulin: Secondary | ICD-10-CM | POA: Insufficient documentation

## 2014-04-22 DIAGNOSIS — F329 Major depressive disorder, single episode, unspecified: Secondary | ICD-10-CM | POA: Diagnosis present

## 2014-04-22 LAB — CBC WITH DIFFERENTIAL/PLATELET
Basophils Absolute: 0 10*3/uL (ref 0.0–0.1)
Basophils Relative: 0 % (ref 0–1)
Eosinophils Absolute: 0.1 10*3/uL (ref 0.0–0.7)
Eosinophils Relative: 2 % (ref 0–5)
HCT: 32.7 % — ABNORMAL LOW (ref 36.0–46.0)
Hemoglobin: 10.4 g/dL — ABNORMAL LOW (ref 12.0–15.0)
Lymphocytes Relative: 23 % (ref 12–46)
Lymphs Abs: 1.6 10*3/uL (ref 0.7–4.0)
MCH: 29.6 pg (ref 26.0–34.0)
MCHC: 31.8 g/dL (ref 30.0–36.0)
MCV: 93.2 fL (ref 78.0–100.0)
Monocytes Absolute: 0.5 10*3/uL (ref 0.1–1.0)
Monocytes Relative: 8 % (ref 3–12)
Neutro Abs: 4.6 10*3/uL (ref 1.7–7.7)
Neutrophils Relative %: 67 % (ref 43–77)
Platelets: 222 10*3/uL (ref 150–400)
RBC: 3.51 MIL/uL — ABNORMAL LOW (ref 3.87–5.11)
RDW: 16.3 % — ABNORMAL HIGH (ref 11.5–15.5)
WBC: 6.8 10*3/uL (ref 4.0–10.5)

## 2014-04-22 LAB — I-STAT CG4 LACTIC ACID, ED: Lactic Acid, Venous: 0.96 mmol/L (ref 0.5–2.2)

## 2014-04-22 LAB — URINALYSIS, ROUTINE W REFLEX MICROSCOPIC
Bilirubin Urine: NEGATIVE
Glucose, UA: NEGATIVE mg/dL
Hgb urine dipstick: NEGATIVE
Ketones, ur: 15 mg/dL — AB
Leukocytes, UA: NEGATIVE
Nitrite: NEGATIVE
Protein, ur: NEGATIVE mg/dL
Specific Gravity, Urine: 1.021 (ref 1.005–1.030)
Urobilinogen, UA: 0.2 mg/dL (ref 0.0–1.0)
pH: 6 (ref 5.0–8.0)

## 2014-04-22 LAB — COMPREHENSIVE METABOLIC PANEL
ALT: 14 U/L (ref 0–35)
AST: 24 U/L (ref 0–37)
Albumin: 3.1 g/dL — ABNORMAL LOW (ref 3.5–5.2)
Alkaline Phosphatase: 120 U/L — ABNORMAL HIGH (ref 39–117)
Anion gap: 14 (ref 5–15)
BUN: 8 mg/dL (ref 6–23)
CO2: 24 mEq/L (ref 19–32)
Calcium: 9.2 mg/dL (ref 8.4–10.5)
Chloride: 103 mEq/L (ref 96–112)
Creatinine, Ser: 0.82 mg/dL (ref 0.50–1.10)
GFR calc Af Amer: 81 mL/min — ABNORMAL LOW (ref >90)
GFR calc non Af Amer: 70 mL/min — ABNORMAL LOW (ref >90)
Glucose, Bld: 178 mg/dL — ABNORMAL HIGH (ref 70–99)
Potassium: 4.5 mEq/L (ref 3.7–5.3)
Sodium: 141 mEq/L (ref 137–147)
Total Bilirubin: 0.2 mg/dL — ABNORMAL LOW (ref 0.3–1.2)
Total Protein: 6.7 g/dL (ref 6.0–8.3)

## 2014-04-22 LAB — PHENYTOIN LEVEL, TOTAL: Phenytoin Lvl: 5.1 ug/mL — ABNORMAL LOW (ref 10.0–20.0)

## 2014-04-22 LAB — CBG MONITORING, ED: Glucose-Capillary: 163 mg/dL — ABNORMAL HIGH (ref 70–99)

## 2014-04-22 LAB — I-STAT TROPONIN, ED: Troponin i, poc: 0.03 ng/mL (ref 0.00–0.08)

## 2014-04-22 MED ORDER — PHENYTOIN 50 MG PO CHEW
50.0000 mg | CHEWABLE_TABLET | Freq: Every day | ORAL | Status: DC
Start: 2014-04-22 — End: 2017-09-18

## 2014-04-22 MED ORDER — PHENYTOIN 50 MG PO CHEW: 50.0000 mg | CHEWABLE_TABLET | Freq: Every day | ORAL | Status: DC

## 2014-04-22 NOTE — Consult Note (Signed)
Reason for Consult: recurrent "spells"  HPI:                                                                                                                                          Brooke Gross is an 73 y.o. female history of diabetes mellitus, bipolar affective disorder, cholesterolemia, hypertension, and recent admission for refractory seizures and acute myocardial infarction. Patient was discharged on Dilantin and Vimpat. Corrected Dilantin level today was low at 7.1. She is presenting now with recurrent spells of feeling that she is having difficulty breathing in addition to shaking of extremities. She remains alert and is aware of what is going on around her as well as able to interact with others. He reportedly has had about 10 spells of this sort with new onset earlier today.  Several spells occurred since she arrived in the emergency room. Patient indicated that she has been upset and had a verbal altercation with the lady with whom she lives prior to onset of her presenting spells.  Past Medical History  Diagnosis Date  . Bipolar 1 disorder   . Diabetes   . Anxiety   . CKD (chronic kidney disease)   . Hypercholesteremia   . Migraine   . Sciatica   . Vertigo, labyrinthine   . Tremor   . Abnormality of gait 12/15/2013  . Obese   . NSTEMI, initial episode of care 03/18/2014  . Hypertension     Past Surgical History  Procedure Laterality Date  . Neck surgery  1990s  . Colonoscopy  2010  . Cholecsystectomy  1977  . Tonsillectomy    . Cataract extraction Bilateral   . Degenerative disc diease      Family History  Problem Relation Age of Onset  . Dementia Mother   . Congestive Heart Failure Mother   . Heart attack Father   . Dementia Father     Social History:  reports that she has quit smoking. Her smoking use included Cigarettes. She has a 7.5 pack-year smoking history. She has never used smokeless tobacco. She reports that she drinks alcohol. She reports that she does  not use illicit drugs.  No Known Allergies  MEDICATIONS:                                                                                                                     I have reviewed the  patient's current medications.   ROS:                                                                                                                                       History obtained from the patient  General ROS: negative for - chills, fatigue, fever, night sweats, weight gain or weight loss Psychological ROS: negative for - as noted in history of present illness Ophthalmic ROS: negative for - blurry vision, double vision, eye pain or loss of vision ENT ROS: negative for - epistaxis, nasal discharge, oral lesions, sore throat, tinnitus or vertigo Allergy and Immunology ROS: negative for - hives or itchy/watery eyes Hematological and Lymphatic ROS: negative for - bleeding problems, bruising or swollen lymph nodes Endocrine ROS: negative for - galactorrhea, hair pattern changes, polydipsia/polyuria or temperature intolerance Respiratory ROS: negative for - cough, hemoptysis, shortness of breath or wheezing Cardiovascular ROS: negative for - chest pain, dyspnea on exertion, edema or irregular heartbeat Gastrointestinal ROS: negative for - abdominal pain, diarrhea, hematemesis, nausea/vomiting or stool incontinence Genito-Urinary ROS: negative for - dysuria, hematuria, incontinence or urinary frequency/urgency Musculoskeletal ROS: negative for - joint swelling or muscular weakness Neurological ROS: as noted in HPI Dermatological ROS: negative for rash and skin lesion changes   Blood pressure 150/54, pulse 73, temperature 98.8 F (37.1 C), temperature source Rectal, resp. rate 17, SpO2 97 %.   Neurologic Examination:                                                                                                      Mental Status: Alert, oriented, thought content appropriate.  Speech fluent  without evidence of aphasia. Able to follow commands without difficulty.   Patient had one of her characteristic spells during my evaluation which consisted of her appearing anxious with an increase in breathing right and diffuse tremulousness. She remained well-oriented and conversant during the spell. She denied chest pain. Spell resolved after about 1 minute.  Cranial Nerves: II-Visual fields were normal. III/IV/VI-Pupils were equal and reacted. Extraocular movements were full and conjugate.    V/VII-no facial numbness and no facial weakness. VIII-normal. X-normal speech and symmetrical palatal movement. XII-midline tongue extension Motor: 5/5 bilaterally with normal tone and bulk Sensory: Normal throughout. Deep Tendon Reflexes: 1+ and symmetric. Plantars: Flexor bilaterally Cerebellar: Normal finger-to-nose testing.   No results found for: CHOL  Results for orders placed or performed during the hospital encounter of 04/22/14 (from the past 48 hour(s))  CBG monitoring, ED  Status: Abnormal   Collection Time: 04/22/14  6:41 AM  Result Value Ref Range   Glucose-Capillary 163 (H) 70 - 99 mg/dL  CBC WITH DIFFERENTIAL     Status: Abnormal   Collection Time: 04/22/14  7:00 AM  Result Value Ref Range   WBC 6.8 4.0 - 10.5 K/uL   RBC 3.51 (L) 3.87 - 5.11 MIL/uL   Hemoglobin 10.4 (L) 12.0 - 15.0 g/dL   HCT 32.7 (L) 36.0 - 46.0 %   MCV 93.2 78.0 - 100.0 fL   MCH 29.6 26.0 - 34.0 pg   MCHC 31.8 30.0 - 36.0 g/dL   RDW 16.3 (H) 11.5 - 15.5 %   Platelets 222 150 - 400 K/uL   Neutrophils Relative % 67 43 - 77 %   Neutro Abs 4.6 1.7 - 7.7 K/uL   Lymphocytes Relative 23 12 - 46 %   Lymphs Abs 1.6 0.7 - 4.0 K/uL   Monocytes Relative 8 3 - 12 %   Monocytes Absolute 0.5 0.1 - 1.0 K/uL   Eosinophils Relative 2 0 - 5 %   Eosinophils Absolute 0.1 0.0 - 0.7 K/uL   Basophils Relative 0 0 - 1 %   Basophils Absolute 0.0 0.0 - 0.1 K/uL  Comprehensive metabolic panel     Status: Abnormal    Collection Time: 04/22/14  7:00 AM  Result Value Ref Range   Sodium 141 137 - 147 mEq/L   Potassium 4.5 3.7 - 5.3 mEq/L   Chloride 103 96 - 112 mEq/L   CO2 24 19 - 32 mEq/L   Glucose, Bld 178 (H) 70 - 99 mg/dL   BUN 8 6 - 23 mg/dL   Creatinine, Ser 0.82 0.50 - 1.10 mg/dL   Calcium 9.2 8.4 - 10.5 mg/dL   Total Protein 6.7 6.0 - 8.3 g/dL   Albumin 3.1 (L) 3.5 - 5.2 g/dL   AST 24 0 - 37 U/L   ALT 14 0 - 35 U/L   Alkaline Phosphatase 120 (H) 39 - 117 U/L   Total Bilirubin 0.2 (L) 0.3 - 1.2 mg/dL   GFR calc non Af Amer 70 (L) >90 mL/min   GFR calc Af Amer 81 (L) >90 mL/min    Comment: (NOTE) The eGFR has been calculated using the CKD EPI equation. This calculation has not been validated in all clinical situations. eGFR's persistently <90 mL/min signify possible Chronic Kidney Disease.    Anion gap 14 5 - 15  Phenytoin level, total     Status: Abnormal   Collection Time: 04/22/14  7:00 AM  Result Value Ref Range   Phenytoin Lvl 5.1 (L) 10.0 - 20.0 ug/mL  I-Stat CG4 Lactic Acid, ED     Status: None   Collection Time: 04/22/14  7:56 AM  Result Value Ref Range   Lactic Acid, Venous 0.96 0.5 - 2.2 mmol/L  Urinalysis, Routine w reflex microscopic     Status: Abnormal   Collection Time: 04/22/14  8:01 AM  Result Value Ref Range   Color, Urine YELLOW YELLOW   APPearance CLEAR CLEAR   Specific Gravity, Urine 1.021 1.005 - 1.030   pH 6.0 5.0 - 8.0   Glucose, UA NEGATIVE NEGATIVE mg/dL   Hgb urine dipstick NEGATIVE NEGATIVE   Bilirubin Urine NEGATIVE NEGATIVE   Ketones, ur 15 (A) NEGATIVE mg/dL   Protein, ur NEGATIVE NEGATIVE mg/dL   Urobilinogen, UA 0.2 0.0 - 1.0 mg/dL   Nitrite NEGATIVE NEGATIVE   Leukocytes, UA NEGATIVE NEGATIVE  Comment: MICROSCOPIC NOT DONE ON URINES WITH NEGATIVE PROTEIN, BLOOD, LEUKOCYTES, NITRITE, OR GLUCOSE <1000 mg/dL.    No results found.   Assessment/Plan: 73 year old lady with multiple medical problems as well as new onset seizure disorder with  intractable seizures initially, presenting with recurrent spells of different characteristic from previously experienced seizures. Presenting spells today are not consistent with epileptic seizures. Significant psychophysiologic factors will likely contributing to her presenting symptomatology.  Recommendations: 1. No further neurological intervention is indicated acutely. 2. Recommend obtaining a troponin level, given her history recent MI and complaint of intermittent difficulty with breathing with the current presentation. 3. Increase daily Dilantin dose to 350 mg. She may take the entire dose nightly at bedtime rather than spreaded out during the day. She will need a prescription for 50 mg tablets. 4. Make a follow-up appointment with Dr. Jannifer Franklin at [facility] in the next 2-4 weeks.  C.R. Nicole Kindred, MD [facility] [phone]  04/22/2014, 9:19 AM

## 2014-04-22 NOTE — ED Notes (Signed)
Patient transported to X-ray 

## 2014-04-22 NOTE — ED Notes (Signed)
Neurology at bedside.

## 2014-04-22 NOTE — ED Notes (Signed)
Per EMS, patient is from [facility] and 45 minutes ago approached staff and stated she was having seizures. Staff denies any seizure activity, EMS denied any seizure activity. Patient has moments where she says she is seizing, but answers all questions and when asked during episode. [facility] staff states she has a hx of bipolar and has had a "mood shift" in the last few days. Baseline neuro status is a/o x4 and follows commands.

## 2014-04-22 NOTE — Discharge Instructions (Signed)
Call your neurologist for further evaluation of your symptoms and repeat Dilantin level. Call for a follow up appointment with a Family or Primary Care Provider.  Return if Symptoms worsen.   Take medication as prescribed.  You can take all of your Dilantin at night, Dilantin 350 mg before bed.

## 2014-04-22 NOTE — ED Notes (Signed)
Provided pt with Malaw[place]urkey sandwich and milk

## 2014-04-22 NOTE — ED Provider Notes (Signed)
CSN: [id number]     Arrival date & time 04/22/14  7829 History   First MD Initiated Contact with Patient 04/22/14 [id number]     Chief Complaint  Patient presents with  . Seizures     (Consider location/radiation/quality/duration/timing/severity/associated sxs/prior Treatment) HPI Comments: The patient is a 73 year old female, current resident at [facility], with a past medical history of CKD, diabetes, bipolar, seizure disorder presenting to the emergency room and chief complaint of multiple seizures since yesterday. Patient describes seizure activity, is generalized shaking, reports she is alert throughout the whole episode. She reports lasting 30 seconds to 1 minute, 10 total episodes. Patient denies focal neuro deficits, recent falls, chest pain, shortness of breath, cough, fever, chills, abdominal pain, vomiting, diarrhea. She reports currently is wheelchair bound due to lower extremity weakness.    Patient is a 74 y.o. female presenting with seizures. The history is provided by the patient. No language interpreter was used.  Seizures   Past Medical History  Diagnosis Date  . Bipolar 1 disorder   . Diabetes   . Anxiety   . CKD (chronic kidney disease)   . Hypercholesteremia   . Migraine   . Sciatica   . Vertigo, labyrinthine   . Tremor   . Abnormality of gait 12/15/2013  . Obese   . NSTEMI, initial episode of care 03/18/2014  . Hypertension    Past Surgical History  Procedure Laterality Date  . Neck surgery  1990s  . Colonoscopy  2010  . Cholecsystectomy  1977  . Tonsillectomy    . Cataract extraction Bilateral   . Degenerative disc diease     Family History  Problem Relation Age of Onset  . Dementia Mother   . Congestive Heart Failure Mother   . Heart attack Father   . Dementia Father    History  Substance Use Topics  . Smoking status: Former Smoker -- 0.50 packs/day for 15 years    Types: Cigarettes  . Smokeless tobacco: Never Used     Comment: quit 1977  . Alcohol  Use: Yes     Comment: occasional   OB History    No data available     Review of Systems  Constitutional: Negative for fever and chills.  Eyes: Negative for visual disturbance.  Respiratory: Negative for cough and shortness of breath.   Cardiovascular: Negative for chest pain.  Gastrointestinal: Positive for nausea. Negative for vomiting and diarrhea.  Neurological: Positive for seizures. Negative for syncope.      Allergies  Review of patient's allergies indicates no known allergies.  Home Medications   Prior to Admission medications   Medication Sig Start Date End Date Taking? Authorizing Provider  aspirin 81 MG chewable tablet Chew 1 tablet (81 mg total) by mouth daily. 04/07/14  Yes Leroy Sea, MD  atorvastatin (LIPITOR) 80 MG tablet Take 1 tablet (80 mg total) by mouth daily at 6 PM. 04/07/14  Yes Leroy Sea, MD  bisacodyl (DULCOLAX) 10 MG suppository Place 10 mg rectally as needed for moderate constipation.   Yes Historical Provider, MD  clopidogrel (PLAVIX) 75 MG tablet Take 1 tablet (75 mg total) by mouth daily. 04/07/14  Yes Leroy Sea, MD  ferrous sulfate 325 (65 FE) MG tablet Take 1 tablet (325 mg total) by mouth 3 (three) times daily with meals. 04/07/14  Yes Leroy Sea, MD  insulin aspart (NOVOLOG) 100 UNIT/ML injection Before each meal 3 times a day, 140-199 - 2 units, 200-250 - 4 units,  251-299 - 6 units,  300-349 - 8 units,  350 or above 10 units. Insulin PEN if approved, provide syringes and needles if needed. 04/07/14  Yes Leroy SeaPrashant K Singh, MD  insulin glargine (LANTUS) 100 UNIT/ML injection Inject 0.4 mLs (40 Units total) into the skin daily. 04/07/14  Yes Leroy SeaPrashant K Singh, MD  lacosamide 100 MG TABS Take 1 tablet (100 mg total) by mouth 2 (two) times daily. 04/07/14  Yes Leroy SeaPrashant K Singh, MD  levETIRAcetam (KEPPRA) 750 MG tablet Take 2 tablets (1,500 mg total) by mouth 2 (two) times daily. 04/07/14  Yes Leroy SeaPrashant K Singh, MD  lisinopril  (PRINIVIL,ZESTRIL) 2.5 MG tablet Take 1 tablet (2.5 mg total) by mouth daily. 04/07/14  Yes Leroy SeaPrashant K Singh, MD  metoprolol (LOPRESSOR) 50 MG tablet Take 1 tablet (50 mg total) by mouth 2 (two) times daily. 04/07/14  Yes Leroy SeaPrashant K Singh, MD  phenytoin (DILANTIN) 100 MG ER capsule Take 1 capsule (100 mg total) by mouth 3 (three) times daily. 04/07/14  Yes Leroy SeaPrashant K Singh, MD  sodium phosphate (FLEET) enema Place 1 enema rectally as needed (for constipation). follow package directions   Yes Historical Provider, MD  venlafaxine XR (EFFEXOR-XR) 75 MG 24 hr capsule Take 75 mg by mouth daily with breakfast.   Yes Historical Provider, MD  food thickener (THICK IT) POWD To thicken all liquids 04/07/14   Leroy SeaPrashant K Singh, MD  methylPREDNISolone (MEDROL DOSEPAK) 4 MG tablet follow package directions 04/07/14   Leroy SeaPrashant K Singh, MD  topiramate (TOPAMAX) 25 MG tablet Take 1 tablet (25 mg total) by mouth daily. 1 more day and stop 04/07/14   Leroy SeaPrashant K Singh, MD   BP 140/86 mmHg  Pulse 70  Temp(Src) 98.2 F (36.8 C) (Oral)  Resp 16  SpO2 96% Physical Exam  Constitutional: She is oriented to person, place, and time. She appears well-developed and well-nourished. No distress.  HENT:  Head: Normocephalic and atraumatic.  Eyes: EOM are normal. Pupils are equal, round, and reactive to light.  Neck: Normal range of motion. Neck supple.  Cardiovascular: Normal rate and regular rhythm.   Murmur heard. Pulmonary/Chest: Effort normal and breath sounds normal. No respiratory distress. She has no wheezes. She has no rales.  Abdominal: Soft. There is no tenderness. There is no rebound and no guarding.  Neurological: She is alert and oriented to person, place, and time. No sensory deficit. GCS eye subscore is 4. GCS verbal subscore is 5. GCS motor subscore is 6.  Patient had 2 episodes of "sezizures" during encounter. With both episodes the patient was alert, able to speak clearly, follow commands, move all 4  extremities, stop shaking to squeeze hands.  Speech is clear and goal oriented, follows commands II-Visual fields were normal.   III/IV/VI-Pupils were equal and reacted. Extraocular movements were full and conjugate.  V/VII-no facial droop.   VIII-normal Motor: Strength 5/5 to upper and lower extremities bilaterally. Moves all 4 extremities equally. Sensory: normal sensation to upper and lower extremities.  Cerebellar: Normal finger to nose bilaterally  Skin: Skin is warm and dry.  Psychiatric: She has a normal mood and affect. Her behavior is normal. Thought content normal.  Nursing note and vitals reviewed.   ED Course  Procedures (including critical care time) Labs Review Labs Reviewed  CBC WITH DIFFERENTIAL - Abnormal; Notable for the following:    RBC 3.51 (*)    Hemoglobin 10.4 (*)    HCT 32.7 (*)    RDW 16.3 (*)  All other components within normal limits  COMPREHENSIVE METABOLIC PANEL - Abnormal; Notable for the following:    Glucose, Bld 178 (*)    Albumin 3.1 (*)    Alkaline Phosphatase 120 (*)    Total Bilirubin 0.2 (*)    GFR calc non Af Amer 70 (*)    GFR calc Af Amer 81 (*)    All other components within normal limits  URINALYSIS, ROUTINE W REFLEX MICROSCOPIC - Abnormal; Notable for the following:    Ketones, ur 15 (*)    All other components within normal limits  PHENYTOIN LEVEL, TOTAL - Abnormal; Notable for the following:    Phenytoin Lvl 5.1 (*)    All other components within normal limits  CBG MONITORING, ED - Abnormal; Notable for the following:    Glucose-Capillary 163 (*)    All other components within normal limits  I-STAT CG4 LACTIC ACID, ED  Rosezena SensorI-STAT TROPOININ, ED    Imaging Review Dg Chest 2 View  04/22/2014   CLINICAL DATA:  Recent seizure activity  EXAM: CHEST  2 VIEW  COMPARISON:  03/29/2014  FINDINGS: Cardiac shadow is mildly enlarged. The lungs are well aerated bilaterally. No focal infiltrate or sizable effusion is seen. No acute bony  abnormality is noted.  IMPRESSION: No acute abnormality seen.   Electronically Signed   By: Alcide CleverMark  Lukens M.D.   On: 04/22/2014 10:17     EKG Interpretation   Date/Time:  Friday April 22 2014 09:52:17 EST Ventricular Rate:  72 PR Interval:  127 QRS Duration: 90 QT Interval:  415 QTC Calculation: 454 R Axis:   67 Text Interpretation:  Sinus rhythm Abnormal R-wave progression, early  transition Confirmed by ZAVITZ  MD, JOSHUA ([id number]) on 04/22/2014 11:09:58 AM      MDM   Final diagnoses:  History of Seizure disorder  Seizure-like activity  Subtherapeutic serum dilantin level   Patient presents with generalized shaking, patient is alert and follows commands through these episodes. Patient does have history of seizure disorder. No focal neurologic deficits on exam. Discussed with Dr. Jodi MourningZavitz also evaluated the patient during this encounter. Discussed patient history, condition with Dr. Roseanne RenoStewart, agrees to evaluate patient in the emergency department. Dr. Roseanne RenoStewart evaluated the patient during the encounter, reported had similar shaking episode during his encounter. He agrees not a seizure. Patient's Dilantin level is low, advises 50 mg per day breathing about total of Dilantin 350 daily. Follow-up with established neurologist outpatient.  Mellody DrownLauren Parker, PA-C 04/22/14 1536  Enid SkeensJoshua M Zavitz, MD 04/22/14 [phone]

## 2014-04-22 NOTE — ED Notes (Signed)
Pt left with [facility]. All belongings sent with [facility]. Pt is alert and oriented.

## 2014-04-29 ENCOUNTER — Encounter: Payer: Self-pay | Admitting: Neurology

## 2014-04-29 ENCOUNTER — Ambulatory Visit (INDEPENDENT_AMBULATORY_CARE_PROVIDER_SITE_OTHER): Payer: Medicare Other | Admitting: Neurology

## 2014-04-29 VITALS — BP 124/61 | HR 51 | Ht 60.0 in | Wt 197.2 lb

## 2014-04-29 DIAGNOSIS — R269 Unspecified abnormalities of gait and mobility: Secondary | ICD-10-CM

## 2014-04-29 DIAGNOSIS — G40909 Epilepsy, unspecified, not intractable, without status epilepticus: Secondary | ICD-10-CM

## 2014-04-29 NOTE — Patient Instructions (Signed)
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
## 2014-05-04 ENCOUNTER — Encounter: Payer: Self-pay | Admitting: Neurology

## 2014-05-10 ENCOUNTER — Encounter: Payer: Self-pay | Admitting: Neurology

## 2014-05-16 ENCOUNTER — Non-Acute Institutional Stay (SKILLED_NURSING_FACILITY): Payer: Medicare Other | Admitting: Internal Medicine

## 2014-05-16 ENCOUNTER — Encounter: Payer: Self-pay | Admitting: Internal Medicine

## 2014-05-16 DIAGNOSIS — G40909 Epilepsy, unspecified, not intractable, without status epilepticus: Secondary | ICD-10-CM

## 2014-05-16 DIAGNOSIS — A419 Sepsis, unspecified organism: Secondary | ICD-10-CM

## 2014-05-16 DIAGNOSIS — R6521 Severe sepsis with septic shock: Secondary | ICD-10-CM

## 2014-05-16 DIAGNOSIS — E119 Type 2 diabetes mellitus without complications: Secondary | ICD-10-CM

## 2014-05-16 DIAGNOSIS — F32A Depression, unspecified: Secondary | ICD-10-CM

## 2014-05-16 DIAGNOSIS — I10 Essential (primary) hypertension: Secondary | ICD-10-CM

## 2014-05-16 DIAGNOSIS — F419 Anxiety disorder, unspecified: Secondary | ICD-10-CM

## 2014-05-16 DIAGNOSIS — F418 Other specified anxiety disorders: Secondary | ICD-10-CM

## 2014-05-16 DIAGNOSIS — I222 Subsequent non-ST elevation (NSTEMI) myocardial infarction: Secondary | ICD-10-CM

## 2014-05-16 DIAGNOSIS — D509 Iron deficiency anemia, unspecified: Secondary | ICD-10-CM

## 2014-05-16 DIAGNOSIS — I214 Non-ST elevation (NSTEMI) myocardial infarction: Secondary | ICD-10-CM

## 2014-05-16 DIAGNOSIS — F329 Major depressive disorder, single episode, unspecified: Secondary | ICD-10-CM

## 2014-05-16 NOTE — Progress Notes (Signed)
MRN: [id number] Name: Brooke Gross  Sex: female Age: 73 y.o. DOB: [birth year]  [facility] Facility/Room: 120 Level Of Care: SNF Provider: Merrilee SeashoreALEXANDER, ANNE D Emergency Contacts: Extended Emergency Contact Information Primary Emergency Contact: Doran Heaterobertson,Allan  [place] Mobile Phone: [phone] Relation: Son Secondary Emergency Contact: Raoul PitchEvett,Alma  [place] Home Phone: [phone] Relation: Sister  Code Status: FULL  Allergies: Aspirin  Chief Complaint  Patient presents with  . Discharge Note    HPI: Patient is 73 y.o. female who was admitted to SNF for OT/PT after multiple seizures and a NSTEMI who is now ready to go home.  Past Medical History  Diagnosis Date  . Bipolar 1 disorder   . Diabetes   . Anxiety   . CKD (chronic kidney disease)   . Hypercholesteremia   . Migraine   . Sciatica   . Vertigo, labyrinthine   . Tremor   . Abnormality of gait 12/15/2013  . Obese   . NSTEMI, initial episode of care 03/18/2014  . Hypertension   . Seizures     Past Surgical History  Procedure Laterality Date  . Neck surgery  1990s  . Colonoscopy  2010  . Cholecsystectomy  1977  . Tonsillectomy    . Cataract extraction Bilateral   . Degenerative disc diease        Medication List       This list is accurate as of: 05/16/14  4:42 PM.  Always use your most recent med list.               aspirin 81 MG chewable tablet  Chew 1 tablet (81 mg total) by mouth daily.     clopidogrel 75 MG tablet  Commonly known as:  PLAVIX  Take 1 tablet (75 mg total) by mouth daily.     ferrous sulfate 325 (65 FE) MG tablet  Take 1 tablet (325 mg total) by mouth 3 (three) times daily with meals.     insulin aspart 100 UNIT/ML injection  Commonly known as:  novoLOG  - Before each meal 3 times a day, 140-199 - 2 units, 200-250 - 4 units, 251-299 - 6 units,  300-349 - 8 units,  350 or above 10 units.  - Insulin PEN if approved, provide  syringes and needles if needed.     insulin glargine 100 UNIT/ML injection  Commonly known as:  LANTUS  Inject 0.4 mLs (40 Units total) into the skin daily.     Lacosamide 100 MG Tabs  Take 1 tablet (100 mg total) by mouth 2 (two) times daily.     levETIRAcetam 750 MG tablet  Commonly known as:  KEPPRA  Take 2 tablets (1,500 mg total) by mouth 2 (two) times daily.     lisinopril 2.5 MG tablet  Commonly known as:  PRINIVIL,ZESTRIL  Take 1 tablet (2.5 mg total) by mouth daily.     metoprolol 50 MG tablet  Commonly known as:  LOPRESSOR  Take 1 tablet (50 mg total) by mouth 2 (two) times daily.     phenytoin 100 MG ER capsule  Commonly known as:  DILANTIN  Take 1 capsule (100 mg total) by mouth 3 (three) times daily.     phenytoin 50 MG tablet  Commonly known as:  DILANTIN  Chew 1 tablet (50 mg total) by mouth at bedtime.     pravastatin 80 MG tablet  Commonly known as:  PRAVACHOL  Take 80 mg by mouth daily.  sodium phosphate enema  Commonly known as:  FLEET  Place 1 enema rectally as needed (for constipation). follow package directions     venlafaxine XR 75 MG 24 hr capsule  Commonly known as:  EFFEXOR-XR  Take 75 mg by mouth daily with breakfast.        Meds ordered this encounter  Medications  . pravastatin (PRAVACHOL) 80 MG tablet    Sig: Take 80 mg by mouth daily.    Immunization History  Administered Date(s) Administered  . Influenza-Unspecified 03/22/2014    History  Substance Use Topics  . Smoking status: Former Smoker -- 0.50 packs/day for 15 years    Types: Cigarettes  . Smokeless tobacco: Never Used     Comment: quit 1977  . Alcohol Use: Yes     Comment: occasional    Filed Vitals:   05/16/14 1625  BP: 136/77  Pulse: 53  Temp: 97.1 F (36.2 C)  Resp: 19    Physical Exam  GENERAL APPEARANCE: Alert, conversant. No acute distress.  HEENT: Unremarkable. RESPIRATORY: Breathing is even, unlabored. Lung sounds are clear    CARDIOVASCULAR: Heart RRR no murmurs, rubs or gallops. No peripheral edema.  GASTROINTESTINAL: Abdomen is soft, non-tender, not distended w/ normal bowel sounds.  NEUROLOGIC: Cranial nerves 2-12 grossly intact. Moves all extremities  Patient Active Problem List   Diagnosis Date Noted  . Seizure disorder 04/22/2014  . Anxiety and depression 04/22/2014  . Acute respiratory failure with hypoxia 04/13/2014  . Severe sepsis with septic shock 04/13/2014  . Anemia, iron deficiency 04/13/2014  . Essential hypertension   . Status epilepticus 03/18/2014  . Non-ST elevation myocardial infarction (NSTEMI), subsequent episode of care 03/18/2014  . Diabetes mellitus type 2, controlled, without complications   . Abnormality of gait 12/15/2013    CBC    Component Value Date/Time   WBC 6.8 04/22/2014 0700   RBC 3.51* 04/22/2014 0700   HGB 10.4* 04/22/2014 0700   HCT 32.7* 04/22/2014 0700   PLT 222 04/22/2014 0700   MCV 93.2 04/22/2014 0700   LYMPHSABS 1.6 04/22/2014 0700   MONOABS 0.5 04/22/2014 0700   EOSABS 0.1 04/22/2014 0700   BASOSABS 0.0 04/22/2014 0700    CMP     Component Value Date/Time   NA 141 04/22/2014 0700   K 4.5 04/22/2014 0700   CL 103 04/22/2014 0700   CO2 24 04/22/2014 0700   GLUCOSE 178* 04/22/2014 0700   BUN 8 04/22/2014 0700   CREATININE 0.82 04/22/2014 0700   CALCIUM 9.2 04/22/2014 0700   PROT 6.7 04/22/2014 0700   ALBUMIN 3.1* 04/22/2014 0700   AST 24 04/22/2014 0700   ALT 14 04/22/2014 0700   ALKPHOS 120* 04/22/2014 0700   BILITOT 0.2* 04/22/2014 0700   GFRNONAA 70* 04/22/2014 0700   GFRAA 81* 04/22/2014 0700    Assessment and Plan  Pt is discharged in stable condition to her sister's home with HH/OT/PT and DME-3:1 BSC and tub bench.  Margit HanksALEXANDER, ANNE D, MD

## 2014-05-18 ENCOUNTER — Ambulatory Visit: Payer: Medicare Other | Admitting: Cardiovascular Disease

## 2014-06-24 DIAGNOSIS — E119 Type 2 diabetes mellitus without complications: Secondary | ICD-10-CM | POA: Diagnosis not present

## 2014-06-24 DIAGNOSIS — F329 Major depressive disorder, single episode, unspecified: Secondary | ICD-10-CM | POA: Diagnosis not present

## 2014-06-24 DIAGNOSIS — N189 Chronic kidney disease, unspecified: Secondary | ICD-10-CM | POA: Diagnosis not present

## 2014-06-24 DIAGNOSIS — I129 Hypertensive chronic kidney disease with stage 1 through stage 4 chronic kidney disease, or unspecified chronic kidney disease: Secondary | ICD-10-CM | POA: Diagnosis not present

## 2014-06-24 DIAGNOSIS — F319 Bipolar disorder, unspecified: Secondary | ICD-10-CM | POA: Diagnosis not present

## 2014-06-24 DIAGNOSIS — M6281 Muscle weakness (generalized): Secondary | ICD-10-CM | POA: Diagnosis not present

## 2014-06-24 DIAGNOSIS — I214 Non-ST elevation (NSTEMI) myocardial infarction: Secondary | ICD-10-CM | POA: Diagnosis not present

## 2014-06-24 DIAGNOSIS — G40901 Epilepsy, unspecified, not intractable, with status epilepticus: Secondary | ICD-10-CM | POA: Diagnosis not present

## 2014-06-24 DIAGNOSIS — G43909 Migraine, unspecified, not intractable, without status migrainosus: Secondary | ICD-10-CM | POA: Diagnosis not present

## 2014-06-24 DIAGNOSIS — R2689 Other abnormalities of gait and mobility: Secondary | ICD-10-CM | POA: Diagnosis not present

## 2014-06-26 DIAGNOSIS — I129 Hypertensive chronic kidney disease with stage 1 through stage 4 chronic kidney disease, or unspecified chronic kidney disease: Secondary | ICD-10-CM | POA: Diagnosis not present

## 2014-06-26 DIAGNOSIS — N189 Chronic kidney disease, unspecified: Secondary | ICD-10-CM | POA: Diagnosis not present

## 2014-06-26 DIAGNOSIS — F319 Bipolar disorder, unspecified: Secondary | ICD-10-CM | POA: Diagnosis not present

## 2014-06-26 DIAGNOSIS — I214 Non-ST elevation (NSTEMI) myocardial infarction: Secondary | ICD-10-CM | POA: Diagnosis not present

## 2014-06-26 DIAGNOSIS — G40901 Epilepsy, unspecified, not intractable, with status epilepticus: Secondary | ICD-10-CM | POA: Diagnosis not present

## 2014-06-26 DIAGNOSIS — G43909 Migraine, unspecified, not intractable, without status migrainosus: Secondary | ICD-10-CM | POA: Diagnosis not present

## 2014-06-26 DIAGNOSIS — F329 Major depressive disorder, single episode, unspecified: Secondary | ICD-10-CM | POA: Diagnosis not present

## 2014-06-26 DIAGNOSIS — R2689 Other abnormalities of gait and mobility: Secondary | ICD-10-CM | POA: Diagnosis not present

## 2014-06-26 DIAGNOSIS — M6281 Muscle weakness (generalized): Secondary | ICD-10-CM | POA: Diagnosis not present

## 2014-06-26 DIAGNOSIS — E119 Type 2 diabetes mellitus without complications: Secondary | ICD-10-CM | POA: Diagnosis not present

## 2014-07-06 DIAGNOSIS — N189 Chronic kidney disease, unspecified: Secondary | ICD-10-CM | POA: Diagnosis not present

## 2014-07-06 DIAGNOSIS — I214 Non-ST elevation (NSTEMI) myocardial infarction: Secondary | ICD-10-CM | POA: Diagnosis not present

## 2014-07-06 DIAGNOSIS — F329 Major depressive disorder, single episode, unspecified: Secondary | ICD-10-CM | POA: Diagnosis not present

## 2014-07-06 DIAGNOSIS — F319 Bipolar disorder, unspecified: Secondary | ICD-10-CM | POA: Diagnosis not present

## 2014-07-06 DIAGNOSIS — G40901 Epilepsy, unspecified, not intractable, with status epilepticus: Secondary | ICD-10-CM | POA: Diagnosis not present

## 2014-07-06 DIAGNOSIS — M6281 Muscle weakness (generalized): Secondary | ICD-10-CM | POA: Diagnosis not present

## 2014-07-06 DIAGNOSIS — I129 Hypertensive chronic kidney disease with stage 1 through stage 4 chronic kidney disease, or unspecified chronic kidney disease: Secondary | ICD-10-CM | POA: Diagnosis not present

## 2014-07-06 DIAGNOSIS — E119 Type 2 diabetes mellitus without complications: Secondary | ICD-10-CM | POA: Diagnosis not present

## 2014-07-06 DIAGNOSIS — R2689 Other abnormalities of gait and mobility: Secondary | ICD-10-CM | POA: Diagnosis not present

## 2014-07-06 DIAGNOSIS — G43909 Migraine, unspecified, not intractable, without status migrainosus: Secondary | ICD-10-CM | POA: Diagnosis not present

## 2014-07-07 DIAGNOSIS — F329 Major depressive disorder, single episode, unspecified: Secondary | ICD-10-CM | POA: Diagnosis not present

## 2014-07-07 DIAGNOSIS — R2689 Other abnormalities of gait and mobility: Secondary | ICD-10-CM | POA: Diagnosis not present

## 2014-07-07 DIAGNOSIS — G43909 Migraine, unspecified, not intractable, without status migrainosus: Secondary | ICD-10-CM | POA: Diagnosis not present

## 2014-07-07 DIAGNOSIS — F319 Bipolar disorder, unspecified: Secondary | ICD-10-CM | POA: Diagnosis not present

## 2014-07-07 DIAGNOSIS — M6281 Muscle weakness (generalized): Secondary | ICD-10-CM | POA: Diagnosis not present

## 2014-07-07 DIAGNOSIS — N189 Chronic kidney disease, unspecified: Secondary | ICD-10-CM | POA: Diagnosis not present

## 2014-07-07 DIAGNOSIS — I214 Non-ST elevation (NSTEMI) myocardial infarction: Secondary | ICD-10-CM | POA: Diagnosis not present

## 2014-07-07 DIAGNOSIS — I129 Hypertensive chronic kidney disease with stage 1 through stage 4 chronic kidney disease, or unspecified chronic kidney disease: Secondary | ICD-10-CM | POA: Diagnosis not present

## 2014-07-07 DIAGNOSIS — G40901 Epilepsy, unspecified, not intractable, with status epilepticus: Secondary | ICD-10-CM | POA: Diagnosis not present

## 2014-07-07 DIAGNOSIS — E119 Type 2 diabetes mellitus without complications: Secondary | ICD-10-CM | POA: Diagnosis not present

## 2014-07-12 DIAGNOSIS — F319 Bipolar disorder, unspecified: Secondary | ICD-10-CM | POA: Diagnosis not present

## 2014-07-12 DIAGNOSIS — I214 Non-ST elevation (NSTEMI) myocardial infarction: Secondary | ICD-10-CM | POA: Diagnosis not present

## 2014-07-12 DIAGNOSIS — M6281 Muscle weakness (generalized): Secondary | ICD-10-CM | POA: Diagnosis not present

## 2014-07-12 DIAGNOSIS — G40901 Epilepsy, unspecified, not intractable, with status epilepticus: Secondary | ICD-10-CM | POA: Diagnosis not present

## 2014-07-12 DIAGNOSIS — N189 Chronic kidney disease, unspecified: Secondary | ICD-10-CM | POA: Diagnosis not present

## 2014-07-12 DIAGNOSIS — I129 Hypertensive chronic kidney disease with stage 1 through stage 4 chronic kidney disease, or unspecified chronic kidney disease: Secondary | ICD-10-CM | POA: Diagnosis not present

## 2014-07-12 DIAGNOSIS — R2689 Other abnormalities of gait and mobility: Secondary | ICD-10-CM | POA: Diagnosis not present

## 2014-07-12 DIAGNOSIS — E119 Type 2 diabetes mellitus without complications: Secondary | ICD-10-CM | POA: Diagnosis not present

## 2014-07-12 DIAGNOSIS — G43909 Migraine, unspecified, not intractable, without status migrainosus: Secondary | ICD-10-CM | POA: Diagnosis not present

## 2014-07-12 DIAGNOSIS — F329 Major depressive disorder, single episode, unspecified: Secondary | ICD-10-CM | POA: Diagnosis not present

## 2014-07-15 DIAGNOSIS — E119 Type 2 diabetes mellitus without complications: Secondary | ICD-10-CM | POA: Diagnosis not present

## 2014-07-15 DIAGNOSIS — R2689 Other abnormalities of gait and mobility: Secondary | ICD-10-CM | POA: Diagnosis not present

## 2014-07-15 DIAGNOSIS — M6281 Muscle weakness (generalized): Secondary | ICD-10-CM | POA: Diagnosis not present

## 2014-07-15 DIAGNOSIS — N189 Chronic kidney disease, unspecified: Secondary | ICD-10-CM | POA: Diagnosis not present

## 2014-07-15 DIAGNOSIS — F329 Major depressive disorder, single episode, unspecified: Secondary | ICD-10-CM | POA: Diagnosis not present

## 2014-07-15 DIAGNOSIS — I129 Hypertensive chronic kidney disease with stage 1 through stage 4 chronic kidney disease, or unspecified chronic kidney disease: Secondary | ICD-10-CM | POA: Diagnosis not present

## 2014-07-15 DIAGNOSIS — G43909 Migraine, unspecified, not intractable, without status migrainosus: Secondary | ICD-10-CM | POA: Diagnosis not present

## 2014-07-15 DIAGNOSIS — F319 Bipolar disorder, unspecified: Secondary | ICD-10-CM | POA: Diagnosis not present

## 2014-07-15 DIAGNOSIS — I214 Non-ST elevation (NSTEMI) myocardial infarction: Secondary | ICD-10-CM | POA: Diagnosis not present

## 2014-07-15 DIAGNOSIS — G40901 Epilepsy, unspecified, not intractable, with status epilepticus: Secondary | ICD-10-CM | POA: Diagnosis not present

## 2014-07-19 DIAGNOSIS — M6281 Muscle weakness (generalized): Secondary | ICD-10-CM | POA: Diagnosis not present

## 2014-07-19 DIAGNOSIS — I214 Non-ST elevation (NSTEMI) myocardial infarction: Secondary | ICD-10-CM | POA: Diagnosis not present

## 2014-07-19 DIAGNOSIS — R2689 Other abnormalities of gait and mobility: Secondary | ICD-10-CM | POA: Diagnosis not present

## 2014-07-19 DIAGNOSIS — G40901 Epilepsy, unspecified, not intractable, with status epilepticus: Secondary | ICD-10-CM | POA: Diagnosis not present

## 2014-07-19 DIAGNOSIS — N189 Chronic kidney disease, unspecified: Secondary | ICD-10-CM | POA: Diagnosis not present

## 2014-07-19 DIAGNOSIS — E119 Type 2 diabetes mellitus without complications: Secondary | ICD-10-CM | POA: Diagnosis not present

## 2014-07-19 DIAGNOSIS — G43909 Migraine, unspecified, not intractable, without status migrainosus: Secondary | ICD-10-CM | POA: Diagnosis not present

## 2014-07-19 DIAGNOSIS — I129 Hypertensive chronic kidney disease with stage 1 through stage 4 chronic kidney disease, or unspecified chronic kidney disease: Secondary | ICD-10-CM | POA: Diagnosis not present

## 2014-07-19 DIAGNOSIS — F329 Major depressive disorder, single episode, unspecified: Secondary | ICD-10-CM | POA: Diagnosis not present

## 2014-07-19 DIAGNOSIS — F319 Bipolar disorder, unspecified: Secondary | ICD-10-CM | POA: Diagnosis not present

## 2014-07-21 DIAGNOSIS — I252 Old myocardial infarction: Secondary | ICD-10-CM | POA: Diagnosis not present

## 2014-07-21 DIAGNOSIS — G40909 Epilepsy, unspecified, not intractable, without status epilepticus: Secondary | ICD-10-CM | POA: Diagnosis not present

## 2014-07-21 DIAGNOSIS — I10 Essential (primary) hypertension: Secondary | ICD-10-CM | POA: Diagnosis not present

## 2014-07-21 DIAGNOSIS — E1365 Other specified diabetes mellitus with hyperglycemia: Secondary | ICD-10-CM | POA: Diagnosis not present

## 2014-07-24 ENCOUNTER — Other Ambulatory Visit: Payer: Self-pay | Admitting: Adult Health

## 2014-07-25 ENCOUNTER — Other Ambulatory Visit: Payer: Self-pay | Admitting: Adult Health

## 2014-08-02 DIAGNOSIS — Z87891 Personal history of nicotine dependence: Secondary | ICD-10-CM | POA: Diagnosis not present

## 2014-08-02 DIAGNOSIS — Y92019 Unspecified place in single-family (private) house as the place of occurrence of the external cause: Secondary | ICD-10-CM | POA: Diagnosis not present

## 2014-08-02 DIAGNOSIS — Z886 Allergy status to analgesic agent status: Secondary | ICD-10-CM | POA: Diagnosis not present

## 2014-08-02 DIAGNOSIS — S199XXA Unspecified injury of neck, initial encounter: Secondary | ICD-10-CM | POA: Diagnosis not present

## 2014-08-02 DIAGNOSIS — S0003XA Contusion of scalp, initial encounter: Secondary | ICD-10-CM | POA: Diagnosis not present

## 2014-08-02 DIAGNOSIS — Y998 Other external cause status: Secondary | ICD-10-CM | POA: Diagnosis not present

## 2014-08-02 DIAGNOSIS — R279 Unspecified lack of coordination: Secondary | ICD-10-CM | POA: Diagnosis not present

## 2014-08-02 DIAGNOSIS — S0990XA Unspecified injury of head, initial encounter: Secondary | ICD-10-CM | POA: Diagnosis not present

## 2014-08-02 DIAGNOSIS — W010XXA Fall on same level from slipping, tripping and stumbling without subsequent striking against object, initial encounter: Secondary | ICD-10-CM | POA: Diagnosis not present

## 2014-08-02 DIAGNOSIS — Z7901 Long term (current) use of anticoagulants: Secondary | ICD-10-CM | POA: Diagnosis not present

## 2014-08-02 DIAGNOSIS — S0101XA Laceration without foreign body of scalp, initial encounter: Secondary | ICD-10-CM | POA: Diagnosis not present

## 2014-08-02 DIAGNOSIS — E119 Type 2 diabetes mellitus without complications: Secondary | ICD-10-CM | POA: Diagnosis not present

## 2014-08-02 DIAGNOSIS — R51 Headache: Secondary | ICD-10-CM | POA: Diagnosis not present

## 2014-08-02 DIAGNOSIS — Z743 Need for continuous supervision: Secondary | ICD-10-CM | POA: Diagnosis not present

## 2014-08-02 DIAGNOSIS — I10 Essential (primary) hypertension: Secondary | ICD-10-CM | POA: Diagnosis not present

## 2014-08-03 DIAGNOSIS — R260 Ataxic gait: Secondary | ICD-10-CM | POA: Diagnosis not present

## 2014-08-03 DIAGNOSIS — E1165 Type 2 diabetes mellitus with hyperglycemia: Secondary | ICD-10-CM | POA: Diagnosis not present

## 2014-08-03 DIAGNOSIS — N189 Chronic kidney disease, unspecified: Secondary | ICD-10-CM | POA: Diagnosis not present

## 2014-08-03 DIAGNOSIS — I252 Old myocardial infarction: Secondary | ICD-10-CM | POA: Diagnosis not present

## 2014-08-03 DIAGNOSIS — G40909 Epilepsy, unspecified, not intractable, without status epilepticus: Secondary | ICD-10-CM | POA: Diagnosis not present

## 2014-08-03 DIAGNOSIS — Z794 Long term (current) use of insulin: Secondary | ICD-10-CM | POA: Diagnosis not present

## 2014-08-03 DIAGNOSIS — G609 Hereditary and idiopathic neuropathy, unspecified: Secondary | ICD-10-CM | POA: Diagnosis not present

## 2014-08-03 DIAGNOSIS — F321 Major depressive disorder, single episode, moderate: Secondary | ICD-10-CM | POA: Diagnosis not present

## 2014-08-03 DIAGNOSIS — I129 Hypertensive chronic kidney disease with stage 1 through stage 4 chronic kidney disease, or unspecified chronic kidney disease: Secondary | ICD-10-CM | POA: Diagnosis not present

## 2014-08-03 DIAGNOSIS — Z9181 History of falling: Secondary | ICD-10-CM | POA: Diagnosis not present

## 2014-08-05 DIAGNOSIS — I129 Hypertensive chronic kidney disease with stage 1 through stage 4 chronic kidney disease, or unspecified chronic kidney disease: Secondary | ICD-10-CM | POA: Diagnosis not present

## 2014-08-05 DIAGNOSIS — Z9181 History of falling: Secondary | ICD-10-CM | POA: Diagnosis not present

## 2014-08-05 DIAGNOSIS — G40909 Epilepsy, unspecified, not intractable, without status epilepticus: Secondary | ICD-10-CM | POA: Diagnosis not present

## 2014-08-05 DIAGNOSIS — I252 Old myocardial infarction: Secondary | ICD-10-CM | POA: Diagnosis not present

## 2014-08-05 DIAGNOSIS — N189 Chronic kidney disease, unspecified: Secondary | ICD-10-CM | POA: Diagnosis not present

## 2014-08-05 DIAGNOSIS — F321 Major depressive disorder, single episode, moderate: Secondary | ICD-10-CM | POA: Diagnosis not present

## 2014-08-05 DIAGNOSIS — R260 Ataxic gait: Secondary | ICD-10-CM | POA: Diagnosis not present

## 2014-08-05 DIAGNOSIS — G609 Hereditary and idiopathic neuropathy, unspecified: Secondary | ICD-10-CM | POA: Diagnosis not present

## 2014-08-05 DIAGNOSIS — E1165 Type 2 diabetes mellitus with hyperglycemia: Secondary | ICD-10-CM | POA: Diagnosis not present

## 2014-08-05 DIAGNOSIS — Z794 Long term (current) use of insulin: Secondary | ICD-10-CM | POA: Diagnosis not present

## 2014-08-08 DIAGNOSIS — R260 Ataxic gait: Secondary | ICD-10-CM | POA: Diagnosis not present

## 2014-08-08 DIAGNOSIS — G40909 Epilepsy, unspecified, not intractable, without status epilepticus: Secondary | ICD-10-CM | POA: Diagnosis not present

## 2014-08-08 DIAGNOSIS — F321 Major depressive disorder, single episode, moderate: Secondary | ICD-10-CM | POA: Diagnosis not present

## 2014-08-08 DIAGNOSIS — I252 Old myocardial infarction: Secondary | ICD-10-CM | POA: Diagnosis not present

## 2014-08-08 DIAGNOSIS — Z794 Long term (current) use of insulin: Secondary | ICD-10-CM | POA: Diagnosis not present

## 2014-08-08 DIAGNOSIS — Z9181 History of falling: Secondary | ICD-10-CM | POA: Diagnosis not present

## 2014-08-08 DIAGNOSIS — I129 Hypertensive chronic kidney disease with stage 1 through stage 4 chronic kidney disease, or unspecified chronic kidney disease: Secondary | ICD-10-CM | POA: Diagnosis not present

## 2014-08-08 DIAGNOSIS — N189 Chronic kidney disease, unspecified: Secondary | ICD-10-CM | POA: Diagnosis not present

## 2014-08-08 DIAGNOSIS — G609 Hereditary and idiopathic neuropathy, unspecified: Secondary | ICD-10-CM | POA: Diagnosis not present

## 2014-08-08 DIAGNOSIS — E1165 Type 2 diabetes mellitus with hyperglycemia: Secondary | ICD-10-CM | POA: Diagnosis not present

## 2014-08-10 DIAGNOSIS — N189 Chronic kidney disease, unspecified: Secondary | ICD-10-CM | POA: Diagnosis not present

## 2014-08-10 DIAGNOSIS — Z9181 History of falling: Secondary | ICD-10-CM | POA: Diagnosis not present

## 2014-08-10 DIAGNOSIS — G40909 Epilepsy, unspecified, not intractable, without status epilepticus: Secondary | ICD-10-CM | POA: Diagnosis not present

## 2014-08-10 DIAGNOSIS — Z794 Long term (current) use of insulin: Secondary | ICD-10-CM | POA: Diagnosis not present

## 2014-08-10 DIAGNOSIS — G609 Hereditary and idiopathic neuropathy, unspecified: Secondary | ICD-10-CM | POA: Diagnosis not present

## 2014-08-10 DIAGNOSIS — I129 Hypertensive chronic kidney disease with stage 1 through stage 4 chronic kidney disease, or unspecified chronic kidney disease: Secondary | ICD-10-CM | POA: Diagnosis not present

## 2014-08-10 DIAGNOSIS — I252 Old myocardial infarction: Secondary | ICD-10-CM | POA: Diagnosis not present

## 2014-08-10 DIAGNOSIS — E1165 Type 2 diabetes mellitus with hyperglycemia: Secondary | ICD-10-CM | POA: Diagnosis not present

## 2014-08-10 DIAGNOSIS — F321 Major depressive disorder, single episode, moderate: Secondary | ICD-10-CM | POA: Diagnosis not present

## 2014-08-10 DIAGNOSIS — R260 Ataxic gait: Secondary | ICD-10-CM | POA: Diagnosis not present

## 2014-08-11 DIAGNOSIS — R260 Ataxic gait: Secondary | ICD-10-CM | POA: Diagnosis not present

## 2014-08-11 DIAGNOSIS — Z794 Long term (current) use of insulin: Secondary | ICD-10-CM | POA: Diagnosis not present

## 2014-08-11 DIAGNOSIS — F321 Major depressive disorder, single episode, moderate: Secondary | ICD-10-CM | POA: Diagnosis not present

## 2014-08-11 DIAGNOSIS — Z9181 History of falling: Secondary | ICD-10-CM | POA: Diagnosis not present

## 2014-08-11 DIAGNOSIS — I252 Old myocardial infarction: Secondary | ICD-10-CM | POA: Diagnosis not present

## 2014-08-11 DIAGNOSIS — I129 Hypertensive chronic kidney disease with stage 1 through stage 4 chronic kidney disease, or unspecified chronic kidney disease: Secondary | ICD-10-CM | POA: Diagnosis not present

## 2014-08-11 DIAGNOSIS — G609 Hereditary and idiopathic neuropathy, unspecified: Secondary | ICD-10-CM | POA: Diagnosis not present

## 2014-08-11 DIAGNOSIS — E1165 Type 2 diabetes mellitus with hyperglycemia: Secondary | ICD-10-CM | POA: Diagnosis not present

## 2014-08-11 DIAGNOSIS — G40909 Epilepsy, unspecified, not intractable, without status epilepticus: Secondary | ICD-10-CM | POA: Diagnosis not present

## 2014-08-11 DIAGNOSIS — N189 Chronic kidney disease, unspecified: Secondary | ICD-10-CM | POA: Diagnosis not present

## 2014-08-12 DIAGNOSIS — G609 Hereditary and idiopathic neuropathy, unspecified: Secondary | ICD-10-CM | POA: Diagnosis not present

## 2014-08-12 DIAGNOSIS — Z9181 History of falling: Secondary | ICD-10-CM | POA: Diagnosis not present

## 2014-08-12 DIAGNOSIS — F321 Major depressive disorder, single episode, moderate: Secondary | ICD-10-CM | POA: Diagnosis not present

## 2014-08-12 DIAGNOSIS — Z794 Long term (current) use of insulin: Secondary | ICD-10-CM | POA: Diagnosis not present

## 2014-08-12 DIAGNOSIS — I252 Old myocardial infarction: Secondary | ICD-10-CM | POA: Diagnosis not present

## 2014-08-12 DIAGNOSIS — I129 Hypertensive chronic kidney disease with stage 1 through stage 4 chronic kidney disease, or unspecified chronic kidney disease: Secondary | ICD-10-CM | POA: Diagnosis not present

## 2014-08-12 DIAGNOSIS — N189 Chronic kidney disease, unspecified: Secondary | ICD-10-CM | POA: Diagnosis not present

## 2014-08-12 DIAGNOSIS — E1165 Type 2 diabetes mellitus with hyperglycemia: Secondary | ICD-10-CM | POA: Diagnosis not present

## 2014-08-12 DIAGNOSIS — G40909 Epilepsy, unspecified, not intractable, without status epilepticus: Secondary | ICD-10-CM | POA: Diagnosis not present

## 2014-08-12 DIAGNOSIS — R260 Ataxic gait: Secondary | ICD-10-CM | POA: Diagnosis not present

## 2014-08-15 DIAGNOSIS — Z9181 History of falling: Secondary | ICD-10-CM | POA: Diagnosis not present

## 2014-08-15 DIAGNOSIS — E1165 Type 2 diabetes mellitus with hyperglycemia: Secondary | ICD-10-CM | POA: Diagnosis not present

## 2014-08-15 DIAGNOSIS — R260 Ataxic gait: Secondary | ICD-10-CM | POA: Diagnosis not present

## 2014-08-15 DIAGNOSIS — N189 Chronic kidney disease, unspecified: Secondary | ICD-10-CM | POA: Diagnosis not present

## 2014-08-15 DIAGNOSIS — G609 Hereditary and idiopathic neuropathy, unspecified: Secondary | ICD-10-CM | POA: Diagnosis not present

## 2014-08-15 DIAGNOSIS — I129 Hypertensive chronic kidney disease with stage 1 through stage 4 chronic kidney disease, or unspecified chronic kidney disease: Secondary | ICD-10-CM | POA: Diagnosis not present

## 2014-08-15 DIAGNOSIS — G40909 Epilepsy, unspecified, not intractable, without status epilepticus: Secondary | ICD-10-CM | POA: Diagnosis not present

## 2014-08-15 DIAGNOSIS — F321 Major depressive disorder, single episode, moderate: Secondary | ICD-10-CM | POA: Diagnosis not present

## 2014-08-15 DIAGNOSIS — Z794 Long term (current) use of insulin: Secondary | ICD-10-CM | POA: Diagnosis not present

## 2014-08-15 DIAGNOSIS — I252 Old myocardial infarction: Secondary | ICD-10-CM | POA: Diagnosis not present

## 2014-08-18 DIAGNOSIS — Z794 Long term (current) use of insulin: Secondary | ICD-10-CM | POA: Diagnosis not present

## 2014-08-18 DIAGNOSIS — G40909 Epilepsy, unspecified, not intractable, without status epilepticus: Secondary | ICD-10-CM | POA: Diagnosis not present

## 2014-08-18 DIAGNOSIS — I129 Hypertensive chronic kidney disease with stage 1 through stage 4 chronic kidney disease, or unspecified chronic kidney disease: Secondary | ICD-10-CM | POA: Diagnosis not present

## 2014-08-18 DIAGNOSIS — F321 Major depressive disorder, single episode, moderate: Secondary | ICD-10-CM | POA: Diagnosis not present

## 2014-08-18 DIAGNOSIS — I252 Old myocardial infarction: Secondary | ICD-10-CM | POA: Diagnosis not present

## 2014-08-18 DIAGNOSIS — Z9181 History of falling: Secondary | ICD-10-CM | POA: Diagnosis not present

## 2014-08-18 DIAGNOSIS — R260 Ataxic gait: Secondary | ICD-10-CM | POA: Diagnosis not present

## 2014-08-18 DIAGNOSIS — E1165 Type 2 diabetes mellitus with hyperglycemia: Secondary | ICD-10-CM | POA: Diagnosis not present

## 2014-08-18 DIAGNOSIS — G609 Hereditary and idiopathic neuropathy, unspecified: Secondary | ICD-10-CM | POA: Diagnosis not present

## 2014-08-18 DIAGNOSIS — N189 Chronic kidney disease, unspecified: Secondary | ICD-10-CM | POA: Diagnosis not present

## 2014-08-19 DIAGNOSIS — F321 Major depressive disorder, single episode, moderate: Secondary | ICD-10-CM | POA: Diagnosis not present

## 2014-08-19 DIAGNOSIS — Z9181 History of falling: Secondary | ICD-10-CM | POA: Diagnosis not present

## 2014-08-19 DIAGNOSIS — N189 Chronic kidney disease, unspecified: Secondary | ICD-10-CM | POA: Diagnosis not present

## 2014-08-19 DIAGNOSIS — Z794 Long term (current) use of insulin: Secondary | ICD-10-CM | POA: Diagnosis not present

## 2014-08-19 DIAGNOSIS — G609 Hereditary and idiopathic neuropathy, unspecified: Secondary | ICD-10-CM | POA: Diagnosis not present

## 2014-08-19 DIAGNOSIS — G40909 Epilepsy, unspecified, not intractable, without status epilepticus: Secondary | ICD-10-CM | POA: Diagnosis not present

## 2014-08-19 DIAGNOSIS — I129 Hypertensive chronic kidney disease with stage 1 through stage 4 chronic kidney disease, or unspecified chronic kidney disease: Secondary | ICD-10-CM | POA: Diagnosis not present

## 2014-08-19 DIAGNOSIS — I252 Old myocardial infarction: Secondary | ICD-10-CM | POA: Diagnosis not present

## 2014-08-19 DIAGNOSIS — E1165 Type 2 diabetes mellitus with hyperglycemia: Secondary | ICD-10-CM | POA: Diagnosis not present

## 2014-08-19 DIAGNOSIS — R260 Ataxic gait: Secondary | ICD-10-CM | POA: Diagnosis not present

## 2014-08-22 DIAGNOSIS — I129 Hypertensive chronic kidney disease with stage 1 through stage 4 chronic kidney disease, or unspecified chronic kidney disease: Secondary | ICD-10-CM | POA: Diagnosis not present

## 2014-08-22 DIAGNOSIS — F321 Major depressive disorder, single episode, moderate: Secondary | ICD-10-CM | POA: Diagnosis not present

## 2014-08-22 DIAGNOSIS — Z1389 Encounter for screening for other disorder: Secondary | ICD-10-CM | POA: Diagnosis not present

## 2014-08-22 DIAGNOSIS — G40909 Epilepsy, unspecified, not intractable, without status epilepticus: Secondary | ICD-10-CM | POA: Diagnosis not present

## 2014-08-22 DIAGNOSIS — G609 Hereditary and idiopathic neuropathy, unspecified: Secondary | ICD-10-CM | POA: Diagnosis not present

## 2014-08-22 DIAGNOSIS — N189 Chronic kidney disease, unspecified: Secondary | ICD-10-CM | POA: Diagnosis not present

## 2014-08-22 DIAGNOSIS — E6609 Other obesity due to excess calories: Secondary | ICD-10-CM | POA: Diagnosis not present

## 2014-08-22 DIAGNOSIS — R260 Ataxic gait: Secondary | ICD-10-CM | POA: Diagnosis not present

## 2014-08-22 DIAGNOSIS — Z794 Long term (current) use of insulin: Secondary | ICD-10-CM | POA: Diagnosis not present

## 2014-08-22 DIAGNOSIS — E1365 Other specified diabetes mellitus with hyperglycemia: Secondary | ICD-10-CM | POA: Diagnosis not present

## 2014-08-22 DIAGNOSIS — Z9181 History of falling: Secondary | ICD-10-CM | POA: Diagnosis not present

## 2014-08-22 DIAGNOSIS — I10 Essential (primary) hypertension: Secondary | ICD-10-CM | POA: Diagnosis not present

## 2014-08-22 DIAGNOSIS — E1165 Type 2 diabetes mellitus with hyperglycemia: Secondary | ICD-10-CM | POA: Diagnosis not present

## 2014-08-22 DIAGNOSIS — I252 Old myocardial infarction: Secondary | ICD-10-CM | POA: Diagnosis not present

## 2014-08-24 DIAGNOSIS — G40909 Epilepsy, unspecified, not intractable, without status epilepticus: Secondary | ICD-10-CM | POA: Diagnosis not present

## 2014-08-24 DIAGNOSIS — N189 Chronic kidney disease, unspecified: Secondary | ICD-10-CM | POA: Diagnosis not present

## 2014-08-24 DIAGNOSIS — G609 Hereditary and idiopathic neuropathy, unspecified: Secondary | ICD-10-CM | POA: Diagnosis not present

## 2014-08-24 DIAGNOSIS — I252 Old myocardial infarction: Secondary | ICD-10-CM | POA: Diagnosis not present

## 2014-08-24 DIAGNOSIS — Z9181 History of falling: Secondary | ICD-10-CM | POA: Diagnosis not present

## 2014-08-24 DIAGNOSIS — R260 Ataxic gait: Secondary | ICD-10-CM | POA: Diagnosis not present

## 2014-08-24 DIAGNOSIS — E1165 Type 2 diabetes mellitus with hyperglycemia: Secondary | ICD-10-CM | POA: Diagnosis not present

## 2014-08-24 DIAGNOSIS — F321 Major depressive disorder, single episode, moderate: Secondary | ICD-10-CM | POA: Diagnosis not present

## 2014-08-24 DIAGNOSIS — I129 Hypertensive chronic kidney disease with stage 1 through stage 4 chronic kidney disease, or unspecified chronic kidney disease: Secondary | ICD-10-CM | POA: Diagnosis not present

## 2014-08-24 DIAGNOSIS — Z794 Long term (current) use of insulin: Secondary | ICD-10-CM | POA: Diagnosis not present

## 2014-08-26 DIAGNOSIS — G40909 Epilepsy, unspecified, not intractable, without status epilepticus: Secondary | ICD-10-CM | POA: Diagnosis not present

## 2014-08-26 DIAGNOSIS — I252 Old myocardial infarction: Secondary | ICD-10-CM | POA: Diagnosis not present

## 2014-08-26 DIAGNOSIS — N189 Chronic kidney disease, unspecified: Secondary | ICD-10-CM | POA: Diagnosis not present

## 2014-08-26 DIAGNOSIS — E1165 Type 2 diabetes mellitus with hyperglycemia: Secondary | ICD-10-CM | POA: Diagnosis not present

## 2014-08-26 DIAGNOSIS — I129 Hypertensive chronic kidney disease with stage 1 through stage 4 chronic kidney disease, or unspecified chronic kidney disease: Secondary | ICD-10-CM | POA: Diagnosis not present

## 2014-08-26 DIAGNOSIS — Z9181 History of falling: Secondary | ICD-10-CM | POA: Diagnosis not present

## 2014-08-26 DIAGNOSIS — R260 Ataxic gait: Secondary | ICD-10-CM | POA: Diagnosis not present

## 2014-08-26 DIAGNOSIS — F321 Major depressive disorder, single episode, moderate: Secondary | ICD-10-CM | POA: Diagnosis not present

## 2014-08-26 DIAGNOSIS — G609 Hereditary and idiopathic neuropathy, unspecified: Secondary | ICD-10-CM | POA: Diagnosis not present

## 2014-08-26 DIAGNOSIS — Z794 Long term (current) use of insulin: Secondary | ICD-10-CM | POA: Diagnosis not present

## 2014-08-29 DIAGNOSIS — I252 Old myocardial infarction: Secondary | ICD-10-CM | POA: Diagnosis not present

## 2014-08-29 DIAGNOSIS — E1165 Type 2 diabetes mellitus with hyperglycemia: Secondary | ICD-10-CM | POA: Diagnosis not present

## 2014-08-29 DIAGNOSIS — G609 Hereditary and idiopathic neuropathy, unspecified: Secondary | ICD-10-CM | POA: Diagnosis not present

## 2014-08-29 DIAGNOSIS — Z794 Long term (current) use of insulin: Secondary | ICD-10-CM | POA: Diagnosis not present

## 2014-08-29 DIAGNOSIS — R260 Ataxic gait: Secondary | ICD-10-CM | POA: Diagnosis not present

## 2014-08-29 DIAGNOSIS — N189 Chronic kidney disease, unspecified: Secondary | ICD-10-CM | POA: Diagnosis not present

## 2014-08-29 DIAGNOSIS — F321 Major depressive disorder, single episode, moderate: Secondary | ICD-10-CM | POA: Diagnosis not present

## 2014-08-29 DIAGNOSIS — G40909 Epilepsy, unspecified, not intractable, without status epilepticus: Secondary | ICD-10-CM | POA: Diagnosis not present

## 2014-08-29 DIAGNOSIS — I129 Hypertensive chronic kidney disease with stage 1 through stage 4 chronic kidney disease, or unspecified chronic kidney disease: Secondary | ICD-10-CM | POA: Diagnosis not present

## 2014-08-29 DIAGNOSIS — Z9181 History of falling: Secondary | ICD-10-CM | POA: Diagnosis not present

## 2014-08-31 DIAGNOSIS — Z9181 History of falling: Secondary | ICD-10-CM | POA: Diagnosis not present

## 2014-08-31 DIAGNOSIS — G609 Hereditary and idiopathic neuropathy, unspecified: Secondary | ICD-10-CM | POA: Diagnosis not present

## 2014-08-31 DIAGNOSIS — N189 Chronic kidney disease, unspecified: Secondary | ICD-10-CM | POA: Diagnosis not present

## 2014-08-31 DIAGNOSIS — G40909 Epilepsy, unspecified, not intractable, without status epilepticus: Secondary | ICD-10-CM | POA: Diagnosis not present

## 2014-08-31 DIAGNOSIS — F321 Major depressive disorder, single episode, moderate: Secondary | ICD-10-CM | POA: Diagnosis not present

## 2014-08-31 DIAGNOSIS — I129 Hypertensive chronic kidney disease with stage 1 through stage 4 chronic kidney disease, or unspecified chronic kidney disease: Secondary | ICD-10-CM | POA: Diagnosis not present

## 2014-08-31 DIAGNOSIS — I252 Old myocardial infarction: Secondary | ICD-10-CM | POA: Diagnosis not present

## 2014-08-31 DIAGNOSIS — Z794 Long term (current) use of insulin: Secondary | ICD-10-CM | POA: Diagnosis not present

## 2014-08-31 DIAGNOSIS — E1165 Type 2 diabetes mellitus with hyperglycemia: Secondary | ICD-10-CM | POA: Diagnosis not present

## 2014-08-31 DIAGNOSIS — R260 Ataxic gait: Secondary | ICD-10-CM | POA: Diagnosis not present

## 2014-09-01 DIAGNOSIS — M6289 Other specified disorders of muscle: Secondary | ICD-10-CM | POA: Diagnosis not present

## 2014-09-01 DIAGNOSIS — W19XXXA Unspecified fall, initial encounter: Secondary | ICD-10-CM | POA: Diagnosis not present

## 2014-09-01 DIAGNOSIS — R4781 Slurred speech: Secondary | ICD-10-CM | POA: Diagnosis not present

## 2014-09-01 DIAGNOSIS — G40909 Epilepsy, unspecified, not intractable, without status epilepticus: Secondary | ICD-10-CM | POA: Diagnosis not present

## 2014-09-01 DIAGNOSIS — R410 Disorientation, unspecified: Secondary | ICD-10-CM | POA: Diagnosis not present

## 2014-09-01 DIAGNOSIS — R296 Repeated falls: Secondary | ICD-10-CM | POA: Diagnosis not present

## 2014-09-02 DIAGNOSIS — Z794 Long term (current) use of insulin: Secondary | ICD-10-CM | POA: Diagnosis not present

## 2014-09-02 DIAGNOSIS — I252 Old myocardial infarction: Secondary | ICD-10-CM | POA: Diagnosis not present

## 2014-09-02 DIAGNOSIS — I129 Hypertensive chronic kidney disease with stage 1 through stage 4 chronic kidney disease, or unspecified chronic kidney disease: Secondary | ICD-10-CM | POA: Diagnosis not present

## 2014-09-02 DIAGNOSIS — N189 Chronic kidney disease, unspecified: Secondary | ICD-10-CM | POA: Diagnosis not present

## 2014-09-02 DIAGNOSIS — R260 Ataxic gait: Secondary | ICD-10-CM | POA: Diagnosis not present

## 2014-09-02 DIAGNOSIS — F321 Major depressive disorder, single episode, moderate: Secondary | ICD-10-CM | POA: Diagnosis not present

## 2014-09-02 DIAGNOSIS — G40909 Epilepsy, unspecified, not intractable, without status epilepticus: Secondary | ICD-10-CM | POA: Diagnosis not present

## 2014-09-02 DIAGNOSIS — G609 Hereditary and idiopathic neuropathy, unspecified: Secondary | ICD-10-CM | POA: Diagnosis not present

## 2014-09-02 DIAGNOSIS — Z9181 History of falling: Secondary | ICD-10-CM | POA: Diagnosis not present

## 2014-09-02 DIAGNOSIS — E1165 Type 2 diabetes mellitus with hyperglycemia: Secondary | ICD-10-CM | POA: Diagnosis not present

## 2014-09-06 DIAGNOSIS — G40909 Epilepsy, unspecified, not intractable, without status epilepticus: Secondary | ICD-10-CM | POA: Diagnosis not present

## 2014-09-06 DIAGNOSIS — Z794 Long term (current) use of insulin: Secondary | ICD-10-CM | POA: Diagnosis not present

## 2014-09-06 DIAGNOSIS — G609 Hereditary and idiopathic neuropathy, unspecified: Secondary | ICD-10-CM | POA: Diagnosis not present

## 2014-09-06 DIAGNOSIS — I252 Old myocardial infarction: Secondary | ICD-10-CM | POA: Diagnosis not present

## 2014-09-06 DIAGNOSIS — R260 Ataxic gait: Secondary | ICD-10-CM | POA: Diagnosis not present

## 2014-09-06 DIAGNOSIS — I129 Hypertensive chronic kidney disease with stage 1 through stage 4 chronic kidney disease, or unspecified chronic kidney disease: Secondary | ICD-10-CM | POA: Diagnosis not present

## 2014-09-06 DIAGNOSIS — N189 Chronic kidney disease, unspecified: Secondary | ICD-10-CM | POA: Diagnosis not present

## 2014-09-06 DIAGNOSIS — E1165 Type 2 diabetes mellitus with hyperglycemia: Secondary | ICD-10-CM | POA: Diagnosis not present

## 2014-09-06 DIAGNOSIS — Z9181 History of falling: Secondary | ICD-10-CM | POA: Diagnosis not present

## 2014-09-06 DIAGNOSIS — F321 Major depressive disorder, single episode, moderate: Secondary | ICD-10-CM | POA: Diagnosis not present

## 2014-09-09 DIAGNOSIS — I252 Old myocardial infarction: Secondary | ICD-10-CM | POA: Diagnosis not present

## 2014-09-09 DIAGNOSIS — G40909 Epilepsy, unspecified, not intractable, without status epilepticus: Secondary | ICD-10-CM | POA: Diagnosis not present

## 2014-09-09 DIAGNOSIS — Z794 Long term (current) use of insulin: Secondary | ICD-10-CM | POA: Diagnosis not present

## 2014-09-09 DIAGNOSIS — R260 Ataxic gait: Secondary | ICD-10-CM | POA: Diagnosis not present

## 2014-09-09 DIAGNOSIS — Z9181 History of falling: Secondary | ICD-10-CM | POA: Diagnosis not present

## 2014-09-09 DIAGNOSIS — E1165 Type 2 diabetes mellitus with hyperglycemia: Secondary | ICD-10-CM | POA: Diagnosis not present

## 2014-09-09 DIAGNOSIS — G609 Hereditary and idiopathic neuropathy, unspecified: Secondary | ICD-10-CM | POA: Diagnosis not present

## 2014-09-09 DIAGNOSIS — I129 Hypertensive chronic kidney disease with stage 1 through stage 4 chronic kidney disease, or unspecified chronic kidney disease: Secondary | ICD-10-CM | POA: Diagnosis not present

## 2014-09-09 DIAGNOSIS — N189 Chronic kidney disease, unspecified: Secondary | ICD-10-CM | POA: Diagnosis not present

## 2014-09-09 DIAGNOSIS — F321 Major depressive disorder, single episode, moderate: Secondary | ICD-10-CM | POA: Diagnosis not present

## 2014-09-12 DIAGNOSIS — R260 Ataxic gait: Secondary | ICD-10-CM | POA: Diagnosis not present

## 2014-09-12 DIAGNOSIS — E1165 Type 2 diabetes mellitus with hyperglycemia: Secondary | ICD-10-CM | POA: Diagnosis not present

## 2014-09-12 DIAGNOSIS — Z794 Long term (current) use of insulin: Secondary | ICD-10-CM | POA: Diagnosis not present

## 2014-09-12 DIAGNOSIS — I129 Hypertensive chronic kidney disease with stage 1 through stage 4 chronic kidney disease, or unspecified chronic kidney disease: Secondary | ICD-10-CM | POA: Diagnosis not present

## 2014-09-12 DIAGNOSIS — G40909 Epilepsy, unspecified, not intractable, without status epilepticus: Secondary | ICD-10-CM | POA: Diagnosis not present

## 2014-09-12 DIAGNOSIS — I252 Old myocardial infarction: Secondary | ICD-10-CM | POA: Diagnosis not present

## 2014-09-12 DIAGNOSIS — Z9181 History of falling: Secondary | ICD-10-CM | POA: Diagnosis not present

## 2014-09-12 DIAGNOSIS — F321 Major depressive disorder, single episode, moderate: Secondary | ICD-10-CM | POA: Diagnosis not present

## 2014-09-12 DIAGNOSIS — G609 Hereditary and idiopathic neuropathy, unspecified: Secondary | ICD-10-CM | POA: Diagnosis not present

## 2014-09-12 DIAGNOSIS — N189 Chronic kidney disease, unspecified: Secondary | ICD-10-CM | POA: Diagnosis not present

## 2014-09-13 DIAGNOSIS — G609 Hereditary and idiopathic neuropathy, unspecified: Secondary | ICD-10-CM | POA: Diagnosis not present

## 2014-09-13 DIAGNOSIS — F321 Major depressive disorder, single episode, moderate: Secondary | ICD-10-CM | POA: Diagnosis not present

## 2014-09-13 DIAGNOSIS — G40909 Epilepsy, unspecified, not intractable, without status epilepticus: Secondary | ICD-10-CM | POA: Diagnosis not present

## 2014-09-13 DIAGNOSIS — Z9181 History of falling: Secondary | ICD-10-CM | POA: Diagnosis not present

## 2014-09-13 DIAGNOSIS — I129 Hypertensive chronic kidney disease with stage 1 through stage 4 chronic kidney disease, or unspecified chronic kidney disease: Secondary | ICD-10-CM | POA: Diagnosis not present

## 2014-09-13 DIAGNOSIS — R260 Ataxic gait: Secondary | ICD-10-CM | POA: Diagnosis not present

## 2014-09-13 DIAGNOSIS — I252 Old myocardial infarction: Secondary | ICD-10-CM | POA: Diagnosis not present

## 2014-09-13 DIAGNOSIS — N189 Chronic kidney disease, unspecified: Secondary | ICD-10-CM | POA: Diagnosis not present

## 2014-09-13 DIAGNOSIS — E1165 Type 2 diabetes mellitus with hyperglycemia: Secondary | ICD-10-CM | POA: Diagnosis not present

## 2014-09-13 DIAGNOSIS — Z794 Long term (current) use of insulin: Secondary | ICD-10-CM | POA: Diagnosis not present

## 2014-09-15 DIAGNOSIS — G40909 Epilepsy, unspecified, not intractable, without status epilepticus: Secondary | ICD-10-CM | POA: Diagnosis not present

## 2014-09-15 DIAGNOSIS — I129 Hypertensive chronic kidney disease with stage 1 through stage 4 chronic kidney disease, or unspecified chronic kidney disease: Secondary | ICD-10-CM | POA: Diagnosis not present

## 2014-09-15 DIAGNOSIS — N189 Chronic kidney disease, unspecified: Secondary | ICD-10-CM | POA: Diagnosis not present

## 2014-09-15 DIAGNOSIS — Z9181 History of falling: Secondary | ICD-10-CM | POA: Diagnosis not present

## 2014-09-15 DIAGNOSIS — Z794 Long term (current) use of insulin: Secondary | ICD-10-CM | POA: Diagnosis not present

## 2014-09-15 DIAGNOSIS — F321 Major depressive disorder, single episode, moderate: Secondary | ICD-10-CM | POA: Diagnosis not present

## 2014-09-15 DIAGNOSIS — E1165 Type 2 diabetes mellitus with hyperglycemia: Secondary | ICD-10-CM | POA: Diagnosis not present

## 2014-09-15 DIAGNOSIS — G609 Hereditary and idiopathic neuropathy, unspecified: Secondary | ICD-10-CM | POA: Diagnosis not present

## 2014-09-15 DIAGNOSIS — I252 Old myocardial infarction: Secondary | ICD-10-CM | POA: Diagnosis not present

## 2014-09-15 DIAGNOSIS — R260 Ataxic gait: Secondary | ICD-10-CM | POA: Diagnosis not present

## 2014-09-16 DIAGNOSIS — R2689 Other abnormalities of gait and mobility: Secondary | ICD-10-CM | POA: Diagnosis not present

## 2014-09-16 DIAGNOSIS — I252 Old myocardial infarction: Secondary | ICD-10-CM | POA: Diagnosis not present

## 2014-09-16 DIAGNOSIS — Z9181 History of falling: Secondary | ICD-10-CM | POA: Diagnosis not present

## 2014-09-16 DIAGNOSIS — F321 Major depressive disorder, single episode, moderate: Secondary | ICD-10-CM | POA: Diagnosis not present

## 2014-09-16 DIAGNOSIS — E1165 Type 2 diabetes mellitus with hyperglycemia: Secondary | ICD-10-CM | POA: Diagnosis not present

## 2014-09-16 DIAGNOSIS — G609 Hereditary and idiopathic neuropathy, unspecified: Secondary | ICD-10-CM | POA: Diagnosis not present

## 2014-09-16 DIAGNOSIS — N189 Chronic kidney disease, unspecified: Secondary | ICD-10-CM | POA: Diagnosis not present

## 2014-09-16 DIAGNOSIS — Z794 Long term (current) use of insulin: Secondary | ICD-10-CM | POA: Diagnosis not present

## 2014-09-16 DIAGNOSIS — I129 Hypertensive chronic kidney disease with stage 1 through stage 4 chronic kidney disease, or unspecified chronic kidney disease: Secondary | ICD-10-CM | POA: Diagnosis not present

## 2014-09-16 DIAGNOSIS — G40909 Epilepsy, unspecified, not intractable, without status epilepticus: Secondary | ICD-10-CM | POA: Diagnosis not present

## 2014-09-20 DIAGNOSIS — G40909 Epilepsy, unspecified, not intractable, without status epilepticus: Secondary | ICD-10-CM | POA: Diagnosis not present

## 2014-09-20 DIAGNOSIS — F321 Major depressive disorder, single episode, moderate: Secondary | ICD-10-CM | POA: Diagnosis not present

## 2014-09-20 DIAGNOSIS — R2689 Other abnormalities of gait and mobility: Secondary | ICD-10-CM | POA: Diagnosis not present

## 2014-09-20 DIAGNOSIS — I129 Hypertensive chronic kidney disease with stage 1 through stage 4 chronic kidney disease, or unspecified chronic kidney disease: Secondary | ICD-10-CM | POA: Diagnosis not present

## 2014-09-20 DIAGNOSIS — I252 Old myocardial infarction: Secondary | ICD-10-CM | POA: Diagnosis not present

## 2014-09-20 DIAGNOSIS — Z9181 History of falling: Secondary | ICD-10-CM | POA: Diagnosis not present

## 2014-09-20 DIAGNOSIS — Z794 Long term (current) use of insulin: Secondary | ICD-10-CM | POA: Diagnosis not present

## 2014-09-20 DIAGNOSIS — N189 Chronic kidney disease, unspecified: Secondary | ICD-10-CM | POA: Diagnosis not present

## 2014-09-20 DIAGNOSIS — G609 Hereditary and idiopathic neuropathy, unspecified: Secondary | ICD-10-CM | POA: Diagnosis not present

## 2014-09-20 DIAGNOSIS — E1165 Type 2 diabetes mellitus with hyperglycemia: Secondary | ICD-10-CM | POA: Diagnosis not present

## 2014-09-22 DIAGNOSIS — N189 Chronic kidney disease, unspecified: Secondary | ICD-10-CM | POA: Diagnosis not present

## 2014-09-22 DIAGNOSIS — Z9181 History of falling: Secondary | ICD-10-CM | POA: Diagnosis not present

## 2014-09-22 DIAGNOSIS — G609 Hereditary and idiopathic neuropathy, unspecified: Secondary | ICD-10-CM | POA: Diagnosis not present

## 2014-09-22 DIAGNOSIS — I129 Hypertensive chronic kidney disease with stage 1 through stage 4 chronic kidney disease, or unspecified chronic kidney disease: Secondary | ICD-10-CM | POA: Diagnosis not present

## 2014-09-22 DIAGNOSIS — I252 Old myocardial infarction: Secondary | ICD-10-CM | POA: Diagnosis not present

## 2014-09-22 DIAGNOSIS — G40909 Epilepsy, unspecified, not intractable, without status epilepticus: Secondary | ICD-10-CM | POA: Diagnosis not present

## 2014-09-22 DIAGNOSIS — Z794 Long term (current) use of insulin: Secondary | ICD-10-CM | POA: Diagnosis not present

## 2014-09-22 DIAGNOSIS — F321 Major depressive disorder, single episode, moderate: Secondary | ICD-10-CM | POA: Diagnosis not present

## 2014-09-22 DIAGNOSIS — E1165 Type 2 diabetes mellitus with hyperglycemia: Secondary | ICD-10-CM | POA: Diagnosis not present

## 2014-09-22 DIAGNOSIS — R2689 Other abnormalities of gait and mobility: Secondary | ICD-10-CM | POA: Diagnosis not present

## 2014-09-23 DIAGNOSIS — I129 Hypertensive chronic kidney disease with stage 1 through stage 4 chronic kidney disease, or unspecified chronic kidney disease: Secondary | ICD-10-CM | POA: Diagnosis not present

## 2014-09-23 DIAGNOSIS — F321 Major depressive disorder, single episode, moderate: Secondary | ICD-10-CM | POA: Diagnosis not present

## 2014-09-23 DIAGNOSIS — Z794 Long term (current) use of insulin: Secondary | ICD-10-CM | POA: Diagnosis not present

## 2014-09-23 DIAGNOSIS — I252 Old myocardial infarction: Secondary | ICD-10-CM | POA: Diagnosis not present

## 2014-09-23 DIAGNOSIS — R2689 Other abnormalities of gait and mobility: Secondary | ICD-10-CM | POA: Diagnosis not present

## 2014-09-23 DIAGNOSIS — G609 Hereditary and idiopathic neuropathy, unspecified: Secondary | ICD-10-CM | POA: Diagnosis not present

## 2014-09-23 DIAGNOSIS — G40909 Epilepsy, unspecified, not intractable, without status epilepticus: Secondary | ICD-10-CM | POA: Diagnosis not present

## 2014-09-23 DIAGNOSIS — Z9181 History of falling: Secondary | ICD-10-CM | POA: Diagnosis not present

## 2014-09-23 DIAGNOSIS — N189 Chronic kidney disease, unspecified: Secondary | ICD-10-CM | POA: Diagnosis not present

## 2014-09-23 DIAGNOSIS — E1165 Type 2 diabetes mellitus with hyperglycemia: Secondary | ICD-10-CM | POA: Diagnosis not present

## 2014-09-26 DIAGNOSIS — N189 Chronic kidney disease, unspecified: Secondary | ICD-10-CM | POA: Diagnosis not present

## 2014-09-26 DIAGNOSIS — Z794 Long term (current) use of insulin: Secondary | ICD-10-CM | POA: Diagnosis not present

## 2014-09-26 DIAGNOSIS — R2689 Other abnormalities of gait and mobility: Secondary | ICD-10-CM | POA: Diagnosis not present

## 2014-09-26 DIAGNOSIS — Z9181 History of falling: Secondary | ICD-10-CM | POA: Diagnosis not present

## 2014-09-26 DIAGNOSIS — G609 Hereditary and idiopathic neuropathy, unspecified: Secondary | ICD-10-CM | POA: Diagnosis not present

## 2014-09-26 DIAGNOSIS — F321 Major depressive disorder, single episode, moderate: Secondary | ICD-10-CM | POA: Diagnosis not present

## 2014-09-26 DIAGNOSIS — G40909 Epilepsy, unspecified, not intractable, without status epilepticus: Secondary | ICD-10-CM | POA: Diagnosis not present

## 2014-09-26 DIAGNOSIS — I252 Old myocardial infarction: Secondary | ICD-10-CM | POA: Diagnosis not present

## 2014-09-26 DIAGNOSIS — E1165 Type 2 diabetes mellitus with hyperglycemia: Secondary | ICD-10-CM | POA: Diagnosis not present

## 2014-09-26 DIAGNOSIS — I129 Hypertensive chronic kidney disease with stage 1 through stage 4 chronic kidney disease, or unspecified chronic kidney disease: Secondary | ICD-10-CM | POA: Diagnosis not present

## 2014-09-27 DIAGNOSIS — N189 Chronic kidney disease, unspecified: Secondary | ICD-10-CM | POA: Diagnosis not present

## 2014-09-27 DIAGNOSIS — I252 Old myocardial infarction: Secondary | ICD-10-CM | POA: Diagnosis not present

## 2014-09-27 DIAGNOSIS — I129 Hypertensive chronic kidney disease with stage 1 through stage 4 chronic kidney disease, or unspecified chronic kidney disease: Secondary | ICD-10-CM | POA: Diagnosis not present

## 2014-09-27 DIAGNOSIS — F321 Major depressive disorder, single episode, moderate: Secondary | ICD-10-CM | POA: Diagnosis not present

## 2014-09-27 DIAGNOSIS — E1165 Type 2 diabetes mellitus with hyperglycemia: Secondary | ICD-10-CM | POA: Diagnosis not present

## 2014-09-27 DIAGNOSIS — R2689 Other abnormalities of gait and mobility: Secondary | ICD-10-CM | POA: Diagnosis not present

## 2014-09-27 DIAGNOSIS — Z794 Long term (current) use of insulin: Secondary | ICD-10-CM | POA: Diagnosis not present

## 2014-09-27 DIAGNOSIS — Z9181 History of falling: Secondary | ICD-10-CM | POA: Diagnosis not present

## 2014-09-27 DIAGNOSIS — G40909 Epilepsy, unspecified, not intractable, without status epilepticus: Secondary | ICD-10-CM | POA: Diagnosis not present

## 2014-09-27 DIAGNOSIS — G609 Hereditary and idiopathic neuropathy, unspecified: Secondary | ICD-10-CM | POA: Diagnosis not present

## 2014-09-29 DIAGNOSIS — R2689 Other abnormalities of gait and mobility: Secondary | ICD-10-CM | POA: Diagnosis not present

## 2014-09-29 DIAGNOSIS — F321 Major depressive disorder, single episode, moderate: Secondary | ICD-10-CM | POA: Diagnosis not present

## 2014-09-29 DIAGNOSIS — Z794 Long term (current) use of insulin: Secondary | ICD-10-CM | POA: Diagnosis not present

## 2014-09-29 DIAGNOSIS — N189 Chronic kidney disease, unspecified: Secondary | ICD-10-CM | POA: Diagnosis not present

## 2014-09-29 DIAGNOSIS — G609 Hereditary and idiopathic neuropathy, unspecified: Secondary | ICD-10-CM | POA: Diagnosis not present

## 2014-09-29 DIAGNOSIS — G40909 Epilepsy, unspecified, not intractable, without status epilepticus: Secondary | ICD-10-CM | POA: Diagnosis not present

## 2014-09-29 DIAGNOSIS — I252 Old myocardial infarction: Secondary | ICD-10-CM | POA: Diagnosis not present

## 2014-09-29 DIAGNOSIS — Z9181 History of falling: Secondary | ICD-10-CM | POA: Diagnosis not present

## 2014-09-29 DIAGNOSIS — I129 Hypertensive chronic kidney disease with stage 1 through stage 4 chronic kidney disease, or unspecified chronic kidney disease: Secondary | ICD-10-CM | POA: Diagnosis not present

## 2014-09-29 DIAGNOSIS — E1165 Type 2 diabetes mellitus with hyperglycemia: Secondary | ICD-10-CM | POA: Diagnosis not present

## 2014-09-30 ENCOUNTER — Telehealth: Payer: Self-pay

## 2014-09-30 ENCOUNTER — Ambulatory Visit: Payer: Medicare Other | Admitting: Neurology

## 2014-09-30 DIAGNOSIS — E1165 Type 2 diabetes mellitus with hyperglycemia: Secondary | ICD-10-CM | POA: Diagnosis not present

## 2014-09-30 DIAGNOSIS — F321 Major depressive disorder, single episode, moderate: Secondary | ICD-10-CM | POA: Diagnosis not present

## 2014-09-30 DIAGNOSIS — N189 Chronic kidney disease, unspecified: Secondary | ICD-10-CM | POA: Diagnosis not present

## 2014-09-30 DIAGNOSIS — G609 Hereditary and idiopathic neuropathy, unspecified: Secondary | ICD-10-CM | POA: Diagnosis not present

## 2014-09-30 DIAGNOSIS — Z794 Long term (current) use of insulin: Secondary | ICD-10-CM | POA: Diagnosis not present

## 2014-09-30 DIAGNOSIS — G40909 Epilepsy, unspecified, not intractable, without status epilepticus: Secondary | ICD-10-CM | POA: Diagnosis not present

## 2014-09-30 DIAGNOSIS — I252 Old myocardial infarction: Secondary | ICD-10-CM | POA: Diagnosis not present

## 2014-09-30 DIAGNOSIS — Z9181 History of falling: Secondary | ICD-10-CM | POA: Diagnosis not present

## 2014-09-30 DIAGNOSIS — R2689 Other abnormalities of gait and mobility: Secondary | ICD-10-CM | POA: Diagnosis not present

## 2014-09-30 DIAGNOSIS — I129 Hypertensive chronic kidney disease with stage 1 through stage 4 chronic kidney disease, or unspecified chronic kidney disease: Secondary | ICD-10-CM | POA: Diagnosis not present

## 2014-09-30 NOTE — Telephone Encounter (Signed)
error 

## 2014-09-30 NOTE — Telephone Encounter (Signed)
Patient did not come to a follow up appointment. 

## 2014-10-04 DIAGNOSIS — I252 Old myocardial infarction: Secondary | ICD-10-CM | POA: Diagnosis not present

## 2014-10-04 DIAGNOSIS — Z9181 History of falling: Secondary | ICD-10-CM | POA: Diagnosis not present

## 2014-10-04 DIAGNOSIS — I129 Hypertensive chronic kidney disease with stage 1 through stage 4 chronic kidney disease, or unspecified chronic kidney disease: Secondary | ICD-10-CM | POA: Diagnosis not present

## 2014-10-04 DIAGNOSIS — G609 Hereditary and idiopathic neuropathy, unspecified: Secondary | ICD-10-CM | POA: Diagnosis not present

## 2014-10-04 DIAGNOSIS — Z794 Long term (current) use of insulin: Secondary | ICD-10-CM | POA: Diagnosis not present

## 2014-10-04 DIAGNOSIS — G40909 Epilepsy, unspecified, not intractable, without status epilepticus: Secondary | ICD-10-CM | POA: Diagnosis not present

## 2014-10-04 DIAGNOSIS — E1165 Type 2 diabetes mellitus with hyperglycemia: Secondary | ICD-10-CM | POA: Diagnosis not present

## 2014-10-04 DIAGNOSIS — R2689 Other abnormalities of gait and mobility: Secondary | ICD-10-CM | POA: Diagnosis not present

## 2014-10-04 DIAGNOSIS — F321 Major depressive disorder, single episode, moderate: Secondary | ICD-10-CM | POA: Diagnosis not present

## 2014-10-04 DIAGNOSIS — N189 Chronic kidney disease, unspecified: Secondary | ICD-10-CM | POA: Diagnosis not present

## 2014-10-06 DIAGNOSIS — R350 Frequency of micturition: Secondary | ICD-10-CM | POA: Diagnosis not present

## 2014-10-06 DIAGNOSIS — R300 Dysuria: Secondary | ICD-10-CM | POA: Diagnosis not present

## 2014-10-07 DIAGNOSIS — N189 Chronic kidney disease, unspecified: Secondary | ICD-10-CM | POA: Diagnosis not present

## 2014-10-07 DIAGNOSIS — R350 Frequency of micturition: Secondary | ICD-10-CM | POA: Diagnosis not present

## 2014-10-07 DIAGNOSIS — I252 Old myocardial infarction: Secondary | ICD-10-CM | POA: Diagnosis not present

## 2014-10-07 DIAGNOSIS — E1165 Type 2 diabetes mellitus with hyperglycemia: Secondary | ICD-10-CM | POA: Diagnosis not present

## 2014-10-07 DIAGNOSIS — R300 Dysuria: Secondary | ICD-10-CM | POA: Diagnosis not present

## 2014-10-07 DIAGNOSIS — Z794 Long term (current) use of insulin: Secondary | ICD-10-CM | POA: Diagnosis not present

## 2014-10-07 DIAGNOSIS — F321 Major depressive disorder, single episode, moderate: Secondary | ICD-10-CM | POA: Diagnosis not present

## 2014-10-07 DIAGNOSIS — Z9181 History of falling: Secondary | ICD-10-CM | POA: Diagnosis not present

## 2014-10-07 DIAGNOSIS — R2689 Other abnormalities of gait and mobility: Secondary | ICD-10-CM | POA: Diagnosis not present

## 2014-10-07 DIAGNOSIS — G609 Hereditary and idiopathic neuropathy, unspecified: Secondary | ICD-10-CM | POA: Diagnosis not present

## 2014-10-07 DIAGNOSIS — G40909 Epilepsy, unspecified, not intractable, without status epilepticus: Secondary | ICD-10-CM | POA: Diagnosis not present

## 2014-10-07 DIAGNOSIS — I129 Hypertensive chronic kidney disease with stage 1 through stage 4 chronic kidney disease, or unspecified chronic kidney disease: Secondary | ICD-10-CM | POA: Diagnosis not present

## 2014-10-10 DIAGNOSIS — R2689 Other abnormalities of gait and mobility: Secondary | ICD-10-CM | POA: Diagnosis not present

## 2014-10-10 DIAGNOSIS — G609 Hereditary and idiopathic neuropathy, unspecified: Secondary | ICD-10-CM | POA: Diagnosis not present

## 2014-10-10 DIAGNOSIS — Z794 Long term (current) use of insulin: Secondary | ICD-10-CM | POA: Diagnosis not present

## 2014-10-10 DIAGNOSIS — I252 Old myocardial infarction: Secondary | ICD-10-CM | POA: Diagnosis not present

## 2014-10-10 DIAGNOSIS — I129 Hypertensive chronic kidney disease with stage 1 through stage 4 chronic kidney disease, or unspecified chronic kidney disease: Secondary | ICD-10-CM | POA: Diagnosis not present

## 2014-10-10 DIAGNOSIS — G40909 Epilepsy, unspecified, not intractable, without status epilepticus: Secondary | ICD-10-CM | POA: Diagnosis not present

## 2014-10-10 DIAGNOSIS — E1165 Type 2 diabetes mellitus with hyperglycemia: Secondary | ICD-10-CM | POA: Diagnosis not present

## 2014-10-10 DIAGNOSIS — F321 Major depressive disorder, single episode, moderate: Secondary | ICD-10-CM | POA: Diagnosis not present

## 2014-10-10 DIAGNOSIS — Z9181 History of falling: Secondary | ICD-10-CM | POA: Diagnosis not present

## 2014-10-10 DIAGNOSIS — N189 Chronic kidney disease, unspecified: Secondary | ICD-10-CM | POA: Diagnosis not present

## 2014-10-11 DIAGNOSIS — I252 Old myocardial infarction: Secondary | ICD-10-CM | POA: Diagnosis not present

## 2014-10-11 DIAGNOSIS — N189 Chronic kidney disease, unspecified: Secondary | ICD-10-CM | POA: Diagnosis not present

## 2014-10-11 DIAGNOSIS — G609 Hereditary and idiopathic neuropathy, unspecified: Secondary | ICD-10-CM | POA: Diagnosis not present

## 2014-10-11 DIAGNOSIS — R2689 Other abnormalities of gait and mobility: Secondary | ICD-10-CM | POA: Diagnosis not present

## 2014-10-11 DIAGNOSIS — Z9181 History of falling: Secondary | ICD-10-CM | POA: Diagnosis not present

## 2014-10-11 DIAGNOSIS — E1165 Type 2 diabetes mellitus with hyperglycemia: Secondary | ICD-10-CM | POA: Diagnosis not present

## 2014-10-11 DIAGNOSIS — I129 Hypertensive chronic kidney disease with stage 1 through stage 4 chronic kidney disease, or unspecified chronic kidney disease: Secondary | ICD-10-CM | POA: Diagnosis not present

## 2014-10-11 DIAGNOSIS — Z794 Long term (current) use of insulin: Secondary | ICD-10-CM | POA: Diagnosis not present

## 2014-10-11 DIAGNOSIS — F321 Major depressive disorder, single episode, moderate: Secondary | ICD-10-CM | POA: Diagnosis not present

## 2014-10-11 DIAGNOSIS — G40909 Epilepsy, unspecified, not intractable, without status epilepticus: Secondary | ICD-10-CM | POA: Diagnosis not present

## 2014-10-14 DIAGNOSIS — N189 Chronic kidney disease, unspecified: Secondary | ICD-10-CM | POA: Diagnosis not present

## 2014-10-14 DIAGNOSIS — Z794 Long term (current) use of insulin: Secondary | ICD-10-CM | POA: Diagnosis not present

## 2014-10-14 DIAGNOSIS — I252 Old myocardial infarction: Secondary | ICD-10-CM | POA: Diagnosis not present

## 2014-10-14 DIAGNOSIS — Z9181 History of falling: Secondary | ICD-10-CM | POA: Diagnosis not present

## 2014-10-14 DIAGNOSIS — I129 Hypertensive chronic kidney disease with stage 1 through stage 4 chronic kidney disease, or unspecified chronic kidney disease: Secondary | ICD-10-CM | POA: Diagnosis not present

## 2014-10-14 DIAGNOSIS — E1165 Type 2 diabetes mellitus with hyperglycemia: Secondary | ICD-10-CM | POA: Diagnosis not present

## 2014-10-14 DIAGNOSIS — G40909 Epilepsy, unspecified, not intractable, without status epilepticus: Secondary | ICD-10-CM | POA: Diagnosis not present

## 2014-10-14 DIAGNOSIS — F321 Major depressive disorder, single episode, moderate: Secondary | ICD-10-CM | POA: Diagnosis not present

## 2014-10-14 DIAGNOSIS — R2689 Other abnormalities of gait and mobility: Secondary | ICD-10-CM | POA: Diagnosis not present

## 2014-10-14 DIAGNOSIS — G609 Hereditary and idiopathic neuropathy, unspecified: Secondary | ICD-10-CM | POA: Diagnosis not present

## 2014-10-17 DIAGNOSIS — G40909 Epilepsy, unspecified, not intractable, without status epilepticus: Secondary | ICD-10-CM | POA: Diagnosis not present

## 2014-10-17 DIAGNOSIS — H8110 Benign paroxysmal vertigo, unspecified ear: Secondary | ICD-10-CM | POA: Diagnosis not present

## 2014-10-17 DIAGNOSIS — F319 Bipolar disorder, unspecified: Secondary | ICD-10-CM | POA: Diagnosis not present

## 2014-10-17 DIAGNOSIS — F419 Anxiety disorder, unspecified: Secondary | ICD-10-CM | POA: Diagnosis not present

## 2014-10-18 DIAGNOSIS — I252 Old myocardial infarction: Secondary | ICD-10-CM | POA: Diagnosis not present

## 2014-10-18 DIAGNOSIS — R2689 Other abnormalities of gait and mobility: Secondary | ICD-10-CM | POA: Diagnosis not present

## 2014-10-18 DIAGNOSIS — G40909 Epilepsy, unspecified, not intractable, without status epilepticus: Secondary | ICD-10-CM | POA: Diagnosis not present

## 2014-10-18 DIAGNOSIS — Z794 Long term (current) use of insulin: Secondary | ICD-10-CM | POA: Diagnosis not present

## 2014-10-18 DIAGNOSIS — G609 Hereditary and idiopathic neuropathy, unspecified: Secondary | ICD-10-CM | POA: Diagnosis not present

## 2014-10-18 DIAGNOSIS — I129 Hypertensive chronic kidney disease with stage 1 through stage 4 chronic kidney disease, or unspecified chronic kidney disease: Secondary | ICD-10-CM | POA: Diagnosis not present

## 2014-10-18 DIAGNOSIS — E1165 Type 2 diabetes mellitus with hyperglycemia: Secondary | ICD-10-CM | POA: Diagnosis not present

## 2014-10-18 DIAGNOSIS — Z9181 History of falling: Secondary | ICD-10-CM | POA: Diagnosis not present

## 2014-10-18 DIAGNOSIS — F321 Major depressive disorder, single episode, moderate: Secondary | ICD-10-CM | POA: Diagnosis not present

## 2014-10-18 DIAGNOSIS — N189 Chronic kidney disease, unspecified: Secondary | ICD-10-CM | POA: Diagnosis not present

## 2014-10-20 ENCOUNTER — Encounter: Payer: Self-pay | Admitting: Neurology

## 2014-10-21 DIAGNOSIS — E1165 Type 2 diabetes mellitus with hyperglycemia: Secondary | ICD-10-CM | POA: Diagnosis not present

## 2014-10-21 DIAGNOSIS — N189 Chronic kidney disease, unspecified: Secondary | ICD-10-CM | POA: Diagnosis not present

## 2014-10-21 DIAGNOSIS — I129 Hypertensive chronic kidney disease with stage 1 through stage 4 chronic kidney disease, or unspecified chronic kidney disease: Secondary | ICD-10-CM | POA: Diagnosis not present

## 2014-10-21 DIAGNOSIS — I252 Old myocardial infarction: Secondary | ICD-10-CM | POA: Diagnosis not present

## 2014-10-21 DIAGNOSIS — Z9181 History of falling: Secondary | ICD-10-CM | POA: Diagnosis not present

## 2014-10-21 DIAGNOSIS — G40909 Epilepsy, unspecified, not intractable, without status epilepticus: Secondary | ICD-10-CM | POA: Diagnosis not present

## 2014-10-21 DIAGNOSIS — Z794 Long term (current) use of insulin: Secondary | ICD-10-CM | POA: Diagnosis not present

## 2014-10-21 DIAGNOSIS — R2689 Other abnormalities of gait and mobility: Secondary | ICD-10-CM | POA: Diagnosis not present

## 2014-10-21 DIAGNOSIS — G609 Hereditary and idiopathic neuropathy, unspecified: Secondary | ICD-10-CM | POA: Diagnosis not present

## 2014-10-21 DIAGNOSIS — F321 Major depressive disorder, single episode, moderate: Secondary | ICD-10-CM | POA: Diagnosis not present

## 2014-10-24 DIAGNOSIS — F321 Major depressive disorder, single episode, moderate: Secondary | ICD-10-CM | POA: Diagnosis not present

## 2014-10-24 DIAGNOSIS — N189 Chronic kidney disease, unspecified: Secondary | ICD-10-CM | POA: Diagnosis not present

## 2014-10-24 DIAGNOSIS — Z9181 History of falling: Secondary | ICD-10-CM | POA: Diagnosis not present

## 2014-10-24 DIAGNOSIS — G40909 Epilepsy, unspecified, not intractable, without status epilepticus: Secondary | ICD-10-CM | POA: Diagnosis not present

## 2014-10-24 DIAGNOSIS — I252 Old myocardial infarction: Secondary | ICD-10-CM | POA: Diagnosis not present

## 2014-10-24 DIAGNOSIS — I129 Hypertensive chronic kidney disease with stage 1 through stage 4 chronic kidney disease, or unspecified chronic kidney disease: Secondary | ICD-10-CM | POA: Diagnosis not present

## 2014-10-24 DIAGNOSIS — E1165 Type 2 diabetes mellitus with hyperglycemia: Secondary | ICD-10-CM | POA: Diagnosis not present

## 2014-10-24 DIAGNOSIS — Z794 Long term (current) use of insulin: Secondary | ICD-10-CM | POA: Diagnosis not present

## 2014-10-24 DIAGNOSIS — R2689 Other abnormalities of gait and mobility: Secondary | ICD-10-CM | POA: Diagnosis not present

## 2014-10-24 DIAGNOSIS — G609 Hereditary and idiopathic neuropathy, unspecified: Secondary | ICD-10-CM | POA: Diagnosis not present

## 2014-10-25 DIAGNOSIS — Z794 Long term (current) use of insulin: Secondary | ICD-10-CM | POA: Diagnosis not present

## 2014-10-25 DIAGNOSIS — E1165 Type 2 diabetes mellitus with hyperglycemia: Secondary | ICD-10-CM | POA: Diagnosis not present

## 2014-10-25 DIAGNOSIS — N189 Chronic kidney disease, unspecified: Secondary | ICD-10-CM | POA: Diagnosis not present

## 2014-10-25 DIAGNOSIS — G40909 Epilepsy, unspecified, not intractable, without status epilepticus: Secondary | ICD-10-CM | POA: Diagnosis not present

## 2014-10-25 DIAGNOSIS — Z9181 History of falling: Secondary | ICD-10-CM | POA: Diagnosis not present

## 2014-10-25 DIAGNOSIS — G609 Hereditary and idiopathic neuropathy, unspecified: Secondary | ICD-10-CM | POA: Diagnosis not present

## 2014-10-25 DIAGNOSIS — R2689 Other abnormalities of gait and mobility: Secondary | ICD-10-CM | POA: Diagnosis not present

## 2014-10-25 DIAGNOSIS — I129 Hypertensive chronic kidney disease with stage 1 through stage 4 chronic kidney disease, or unspecified chronic kidney disease: Secondary | ICD-10-CM | POA: Diagnosis not present

## 2014-10-25 DIAGNOSIS — I252 Old myocardial infarction: Secondary | ICD-10-CM | POA: Diagnosis not present

## 2014-10-25 DIAGNOSIS — F321 Major depressive disorder, single episode, moderate: Secondary | ICD-10-CM | POA: Diagnosis not present

## 2014-10-26 DIAGNOSIS — N189 Chronic kidney disease, unspecified: Secondary | ICD-10-CM | POA: Diagnosis not present

## 2014-10-26 DIAGNOSIS — Z794 Long term (current) use of insulin: Secondary | ICD-10-CM | POA: Diagnosis not present

## 2014-10-26 DIAGNOSIS — R2689 Other abnormalities of gait and mobility: Secondary | ICD-10-CM | POA: Diagnosis not present

## 2014-10-26 DIAGNOSIS — I252 Old myocardial infarction: Secondary | ICD-10-CM | POA: Diagnosis not present

## 2014-10-26 DIAGNOSIS — Z9181 History of falling: Secondary | ICD-10-CM | POA: Diagnosis not present

## 2014-10-26 DIAGNOSIS — G609 Hereditary and idiopathic neuropathy, unspecified: Secondary | ICD-10-CM | POA: Diagnosis not present

## 2014-10-26 DIAGNOSIS — G40909 Epilepsy, unspecified, not intractable, without status epilepticus: Secondary | ICD-10-CM | POA: Diagnosis not present

## 2014-10-26 DIAGNOSIS — F321 Major depressive disorder, single episode, moderate: Secondary | ICD-10-CM | POA: Diagnosis not present

## 2014-10-26 DIAGNOSIS — E1165 Type 2 diabetes mellitus with hyperglycemia: Secondary | ICD-10-CM | POA: Diagnosis not present

## 2014-10-26 DIAGNOSIS — I129 Hypertensive chronic kidney disease with stage 1 through stage 4 chronic kidney disease, or unspecified chronic kidney disease: Secondary | ICD-10-CM | POA: Diagnosis not present

## 2014-11-01 DIAGNOSIS — G40909 Epilepsy, unspecified, not intractable, without status epilepticus: Secondary | ICD-10-CM | POA: Diagnosis not present

## 2014-11-01 DIAGNOSIS — I252 Old myocardial infarction: Secondary | ICD-10-CM | POA: Diagnosis not present

## 2014-11-01 DIAGNOSIS — N189 Chronic kidney disease, unspecified: Secondary | ICD-10-CM | POA: Diagnosis not present

## 2014-11-01 DIAGNOSIS — G609 Hereditary and idiopathic neuropathy, unspecified: Secondary | ICD-10-CM | POA: Diagnosis not present

## 2014-11-01 DIAGNOSIS — Z9181 History of falling: Secondary | ICD-10-CM | POA: Diagnosis not present

## 2014-11-01 DIAGNOSIS — R2689 Other abnormalities of gait and mobility: Secondary | ICD-10-CM | POA: Diagnosis not present

## 2014-11-01 DIAGNOSIS — E1165 Type 2 diabetes mellitus with hyperglycemia: Secondary | ICD-10-CM | POA: Diagnosis not present

## 2014-11-01 DIAGNOSIS — I129 Hypertensive chronic kidney disease with stage 1 through stage 4 chronic kidney disease, or unspecified chronic kidney disease: Secondary | ICD-10-CM | POA: Diagnosis not present

## 2014-11-01 DIAGNOSIS — F321 Major depressive disorder, single episode, moderate: Secondary | ICD-10-CM | POA: Diagnosis not present

## 2014-11-01 DIAGNOSIS — Z794 Long term (current) use of insulin: Secondary | ICD-10-CM | POA: Diagnosis not present

## 2014-11-03 DIAGNOSIS — G40909 Epilepsy, unspecified, not intractable, without status epilepticus: Secondary | ICD-10-CM | POA: Diagnosis not present

## 2014-11-03 DIAGNOSIS — E11649 Type 2 diabetes mellitus with hypoglycemia without coma: Secondary | ICD-10-CM | POA: Diagnosis not present

## 2014-11-03 DIAGNOSIS — F419 Anxiety disorder, unspecified: Secondary | ICD-10-CM | POA: Diagnosis not present

## 2014-11-03 DIAGNOSIS — E6609 Other obesity due to excess calories: Secondary | ICD-10-CM | POA: Diagnosis not present

## 2014-11-03 DIAGNOSIS — M25561 Pain in right knee: Secondary | ICD-10-CM | POA: Diagnosis not present

## 2014-11-04 DIAGNOSIS — G40909 Epilepsy, unspecified, not intractable, without status epilepticus: Secondary | ICD-10-CM | POA: Diagnosis not present

## 2014-11-04 DIAGNOSIS — Z9181 History of falling: Secondary | ICD-10-CM | POA: Diagnosis not present

## 2014-11-04 DIAGNOSIS — R2689 Other abnormalities of gait and mobility: Secondary | ICD-10-CM | POA: Diagnosis not present

## 2014-11-04 DIAGNOSIS — R296 Repeated falls: Secondary | ICD-10-CM | POA: Diagnosis not present

## 2014-11-04 DIAGNOSIS — N189 Chronic kidney disease, unspecified: Secondary | ICD-10-CM | POA: Diagnosis not present

## 2014-11-04 DIAGNOSIS — Z794 Long term (current) use of insulin: Secondary | ICD-10-CM | POA: Diagnosis not present

## 2014-11-04 DIAGNOSIS — E1165 Type 2 diabetes mellitus with hyperglycemia: Secondary | ICD-10-CM | POA: Diagnosis not present

## 2014-11-04 DIAGNOSIS — I252 Old myocardial infarction: Secondary | ICD-10-CM | POA: Diagnosis not present

## 2014-11-04 DIAGNOSIS — G609 Hereditary and idiopathic neuropathy, unspecified: Secondary | ICD-10-CM | POA: Diagnosis not present

## 2014-11-04 DIAGNOSIS — I129 Hypertensive chronic kidney disease with stage 1 through stage 4 chronic kidney disease, or unspecified chronic kidney disease: Secondary | ICD-10-CM | POA: Diagnosis not present

## 2014-11-04 DIAGNOSIS — F321 Major depressive disorder, single episode, moderate: Secondary | ICD-10-CM | POA: Diagnosis not present

## 2014-11-08 DIAGNOSIS — G40909 Epilepsy, unspecified, not intractable, without status epilepticus: Secondary | ICD-10-CM | POA: Diagnosis not present

## 2014-11-08 DIAGNOSIS — Z794 Long term (current) use of insulin: Secondary | ICD-10-CM | POA: Diagnosis not present

## 2014-11-08 DIAGNOSIS — G609 Hereditary and idiopathic neuropathy, unspecified: Secondary | ICD-10-CM | POA: Diagnosis not present

## 2014-11-08 DIAGNOSIS — I252 Old myocardial infarction: Secondary | ICD-10-CM | POA: Diagnosis not present

## 2014-11-08 DIAGNOSIS — E1165 Type 2 diabetes mellitus with hyperglycemia: Secondary | ICD-10-CM | POA: Diagnosis not present

## 2014-11-08 DIAGNOSIS — N189 Chronic kidney disease, unspecified: Secondary | ICD-10-CM | POA: Diagnosis not present

## 2014-11-08 DIAGNOSIS — Z9181 History of falling: Secondary | ICD-10-CM | POA: Diagnosis not present

## 2014-11-08 DIAGNOSIS — R2689 Other abnormalities of gait and mobility: Secondary | ICD-10-CM | POA: Diagnosis not present

## 2014-11-08 DIAGNOSIS — I129 Hypertensive chronic kidney disease with stage 1 through stage 4 chronic kidney disease, or unspecified chronic kidney disease: Secondary | ICD-10-CM | POA: Diagnosis not present

## 2014-11-08 DIAGNOSIS — F321 Major depressive disorder, single episode, moderate: Secondary | ICD-10-CM | POA: Diagnosis not present

## 2014-11-11 DIAGNOSIS — G609 Hereditary and idiopathic neuropathy, unspecified: Secondary | ICD-10-CM | POA: Diagnosis not present

## 2014-11-11 DIAGNOSIS — I252 Old myocardial infarction: Secondary | ICD-10-CM | POA: Diagnosis not present

## 2014-11-11 DIAGNOSIS — Z794 Long term (current) use of insulin: Secondary | ICD-10-CM | POA: Diagnosis not present

## 2014-11-11 DIAGNOSIS — N189 Chronic kidney disease, unspecified: Secondary | ICD-10-CM | POA: Diagnosis not present

## 2014-11-11 DIAGNOSIS — G40909 Epilepsy, unspecified, not intractable, without status epilepticus: Secondary | ICD-10-CM | POA: Diagnosis not present

## 2014-11-11 DIAGNOSIS — I129 Hypertensive chronic kidney disease with stage 1 through stage 4 chronic kidney disease, or unspecified chronic kidney disease: Secondary | ICD-10-CM | POA: Diagnosis not present

## 2014-11-11 DIAGNOSIS — F321 Major depressive disorder, single episode, moderate: Secondary | ICD-10-CM | POA: Diagnosis not present

## 2014-11-11 DIAGNOSIS — Z9181 History of falling: Secondary | ICD-10-CM | POA: Diagnosis not present

## 2014-11-11 DIAGNOSIS — R2689 Other abnormalities of gait and mobility: Secondary | ICD-10-CM | POA: Diagnosis not present

## 2014-11-11 DIAGNOSIS — E1165 Type 2 diabetes mellitus with hyperglycemia: Secondary | ICD-10-CM | POA: Diagnosis not present

## 2014-11-16 DIAGNOSIS — F321 Major depressive disorder, single episode, moderate: Secondary | ICD-10-CM | POA: Diagnosis not present

## 2014-11-16 DIAGNOSIS — I252 Old myocardial infarction: Secondary | ICD-10-CM | POA: Diagnosis not present

## 2014-11-16 DIAGNOSIS — G609 Hereditary and idiopathic neuropathy, unspecified: Secondary | ICD-10-CM | POA: Diagnosis not present

## 2014-11-16 DIAGNOSIS — Z794 Long term (current) use of insulin: Secondary | ICD-10-CM | POA: Diagnosis not present

## 2014-11-16 DIAGNOSIS — E1165 Type 2 diabetes mellitus with hyperglycemia: Secondary | ICD-10-CM | POA: Diagnosis not present

## 2014-11-16 DIAGNOSIS — Z9181 History of falling: Secondary | ICD-10-CM | POA: Diagnosis not present

## 2014-11-16 DIAGNOSIS — G40909 Epilepsy, unspecified, not intractable, without status epilepticus: Secondary | ICD-10-CM | POA: Diagnosis not present

## 2014-11-16 DIAGNOSIS — N189 Chronic kidney disease, unspecified: Secondary | ICD-10-CM | POA: Diagnosis not present

## 2014-11-16 DIAGNOSIS — I129 Hypertensive chronic kidney disease with stage 1 through stage 4 chronic kidney disease, or unspecified chronic kidney disease: Secondary | ICD-10-CM | POA: Diagnosis not present

## 2014-11-18 DIAGNOSIS — I129 Hypertensive chronic kidney disease with stage 1 through stage 4 chronic kidney disease, or unspecified chronic kidney disease: Secondary | ICD-10-CM | POA: Diagnosis not present

## 2014-11-18 DIAGNOSIS — N189 Chronic kidney disease, unspecified: Secondary | ICD-10-CM | POA: Diagnosis not present

## 2014-11-18 DIAGNOSIS — F321 Major depressive disorder, single episode, moderate: Secondary | ICD-10-CM | POA: Diagnosis not present

## 2014-11-18 DIAGNOSIS — Z9181 History of falling: Secondary | ICD-10-CM | POA: Diagnosis not present

## 2014-11-18 DIAGNOSIS — I252 Old myocardial infarction: Secondary | ICD-10-CM | POA: Diagnosis not present

## 2014-11-18 DIAGNOSIS — E1165 Type 2 diabetes mellitus with hyperglycemia: Secondary | ICD-10-CM | POA: Diagnosis not present

## 2014-11-18 DIAGNOSIS — Z794 Long term (current) use of insulin: Secondary | ICD-10-CM | POA: Diagnosis not present

## 2014-11-18 DIAGNOSIS — G40909 Epilepsy, unspecified, not intractable, without status epilepticus: Secondary | ICD-10-CM | POA: Diagnosis not present

## 2014-11-18 DIAGNOSIS — G609 Hereditary and idiopathic neuropathy, unspecified: Secondary | ICD-10-CM | POA: Diagnosis not present

## 2014-11-21 DIAGNOSIS — Z9181 History of falling: Secondary | ICD-10-CM | POA: Diagnosis not present

## 2014-11-21 DIAGNOSIS — I252 Old myocardial infarction: Secondary | ICD-10-CM | POA: Diagnosis not present

## 2014-11-21 DIAGNOSIS — F321 Major depressive disorder, single episode, moderate: Secondary | ICD-10-CM | POA: Diagnosis not present

## 2014-11-21 DIAGNOSIS — Z794 Long term (current) use of insulin: Secondary | ICD-10-CM | POA: Diagnosis not present

## 2014-11-21 DIAGNOSIS — N189 Chronic kidney disease, unspecified: Secondary | ICD-10-CM | POA: Diagnosis not present

## 2014-11-21 DIAGNOSIS — I129 Hypertensive chronic kidney disease with stage 1 through stage 4 chronic kidney disease, or unspecified chronic kidney disease: Secondary | ICD-10-CM | POA: Diagnosis not present

## 2014-11-21 DIAGNOSIS — E1165 Type 2 diabetes mellitus with hyperglycemia: Secondary | ICD-10-CM | POA: Diagnosis not present

## 2014-11-21 DIAGNOSIS — G40909 Epilepsy, unspecified, not intractable, without status epilepticus: Secondary | ICD-10-CM | POA: Diagnosis not present

## 2014-11-21 DIAGNOSIS — G609 Hereditary and idiopathic neuropathy, unspecified: Secondary | ICD-10-CM | POA: Diagnosis not present

## 2014-11-22 DIAGNOSIS — Z794 Long term (current) use of insulin: Secondary | ICD-10-CM | POA: Diagnosis not present

## 2014-11-22 DIAGNOSIS — Z9181 History of falling: Secondary | ICD-10-CM | POA: Diagnosis not present

## 2014-11-22 DIAGNOSIS — I129 Hypertensive chronic kidney disease with stage 1 through stage 4 chronic kidney disease, or unspecified chronic kidney disease: Secondary | ICD-10-CM | POA: Diagnosis not present

## 2014-11-22 DIAGNOSIS — E1165 Type 2 diabetes mellitus with hyperglycemia: Secondary | ICD-10-CM | POA: Diagnosis not present

## 2014-11-22 DIAGNOSIS — F321 Major depressive disorder, single episode, moderate: Secondary | ICD-10-CM | POA: Diagnosis not present

## 2014-11-22 DIAGNOSIS — G40909 Epilepsy, unspecified, not intractable, without status epilepticus: Secondary | ICD-10-CM | POA: Diagnosis not present

## 2014-11-22 DIAGNOSIS — N189 Chronic kidney disease, unspecified: Secondary | ICD-10-CM | POA: Diagnosis not present

## 2014-11-22 DIAGNOSIS — G609 Hereditary and idiopathic neuropathy, unspecified: Secondary | ICD-10-CM | POA: Diagnosis not present

## 2014-11-22 DIAGNOSIS — I252 Old myocardial infarction: Secondary | ICD-10-CM | POA: Diagnosis not present

## 2014-11-24 DIAGNOSIS — I129 Hypertensive chronic kidney disease with stage 1 through stage 4 chronic kidney disease, or unspecified chronic kidney disease: Secondary | ICD-10-CM | POA: Diagnosis not present

## 2014-11-24 DIAGNOSIS — F321 Major depressive disorder, single episode, moderate: Secondary | ICD-10-CM | POA: Diagnosis not present

## 2014-11-24 DIAGNOSIS — Z794 Long term (current) use of insulin: Secondary | ICD-10-CM | POA: Diagnosis not present

## 2014-11-24 DIAGNOSIS — G609 Hereditary and idiopathic neuropathy, unspecified: Secondary | ICD-10-CM | POA: Diagnosis not present

## 2014-11-24 DIAGNOSIS — Z9181 History of falling: Secondary | ICD-10-CM | POA: Diagnosis not present

## 2014-11-24 DIAGNOSIS — I252 Old myocardial infarction: Secondary | ICD-10-CM | POA: Diagnosis not present

## 2014-11-24 DIAGNOSIS — N189 Chronic kidney disease, unspecified: Secondary | ICD-10-CM | POA: Diagnosis not present

## 2014-11-24 DIAGNOSIS — E1165 Type 2 diabetes mellitus with hyperglycemia: Secondary | ICD-10-CM | POA: Diagnosis not present

## 2014-11-24 DIAGNOSIS — G40909 Epilepsy, unspecified, not intractable, without status epilepticus: Secondary | ICD-10-CM | POA: Diagnosis not present

## 2014-11-25 DIAGNOSIS — Z794 Long term (current) use of insulin: Secondary | ICD-10-CM | POA: Diagnosis not present

## 2014-11-25 DIAGNOSIS — G40909 Epilepsy, unspecified, not intractable, without status epilepticus: Secondary | ICD-10-CM | POA: Diagnosis not present

## 2014-11-25 DIAGNOSIS — F321 Major depressive disorder, single episode, moderate: Secondary | ICD-10-CM | POA: Diagnosis not present

## 2014-11-25 DIAGNOSIS — E1165 Type 2 diabetes mellitus with hyperglycemia: Secondary | ICD-10-CM | POA: Diagnosis not present

## 2014-11-25 DIAGNOSIS — I129 Hypertensive chronic kidney disease with stage 1 through stage 4 chronic kidney disease, or unspecified chronic kidney disease: Secondary | ICD-10-CM | POA: Diagnosis not present

## 2014-11-25 DIAGNOSIS — Z9181 History of falling: Secondary | ICD-10-CM | POA: Diagnosis not present

## 2014-11-25 DIAGNOSIS — E1365 Other specified diabetes mellitus with hyperglycemia: Secondary | ICD-10-CM | POA: Diagnosis not present

## 2014-11-25 DIAGNOSIS — E780 Pure hypercholesterolemia: Secondary | ICD-10-CM | POA: Diagnosis not present

## 2014-11-25 DIAGNOSIS — G609 Hereditary and idiopathic neuropathy, unspecified: Secondary | ICD-10-CM | POA: Diagnosis not present

## 2014-11-25 DIAGNOSIS — N189 Chronic kidney disease, unspecified: Secondary | ICD-10-CM | POA: Diagnosis not present

## 2014-11-25 DIAGNOSIS — I10 Essential (primary) hypertension: Secondary | ICD-10-CM | POA: Diagnosis not present

## 2014-11-25 DIAGNOSIS — I252 Old myocardial infarction: Secondary | ICD-10-CM | POA: Diagnosis not present

## 2014-11-29 DIAGNOSIS — E1165 Type 2 diabetes mellitus with hyperglycemia: Secondary | ICD-10-CM | POA: Diagnosis not present

## 2014-11-29 DIAGNOSIS — I129 Hypertensive chronic kidney disease with stage 1 through stage 4 chronic kidney disease, or unspecified chronic kidney disease: Secondary | ICD-10-CM | POA: Diagnosis not present

## 2014-11-29 DIAGNOSIS — G609 Hereditary and idiopathic neuropathy, unspecified: Secondary | ICD-10-CM | POA: Diagnosis not present

## 2014-11-29 DIAGNOSIS — Z9181 History of falling: Secondary | ICD-10-CM | POA: Diagnosis not present

## 2014-11-29 DIAGNOSIS — G40909 Epilepsy, unspecified, not intractable, without status epilepticus: Secondary | ICD-10-CM | POA: Diagnosis not present

## 2014-11-29 DIAGNOSIS — N189 Chronic kidney disease, unspecified: Secondary | ICD-10-CM | POA: Diagnosis not present

## 2014-11-29 DIAGNOSIS — Z794 Long term (current) use of insulin: Secondary | ICD-10-CM | POA: Diagnosis not present

## 2014-11-29 DIAGNOSIS — F321 Major depressive disorder, single episode, moderate: Secondary | ICD-10-CM | POA: Diagnosis not present

## 2014-11-29 DIAGNOSIS — I252 Old myocardial infarction: Secondary | ICD-10-CM | POA: Diagnosis not present

## 2014-12-01 DIAGNOSIS — E6609 Other obesity due to excess calories: Secondary | ICD-10-CM | POA: Diagnosis not present

## 2014-12-01 DIAGNOSIS — I10 Essential (primary) hypertension: Secondary | ICD-10-CM | POA: Diagnosis not present

## 2014-12-01 DIAGNOSIS — E119 Type 2 diabetes mellitus without complications: Secondary | ICD-10-CM | POA: Diagnosis not present

## 2014-12-01 DIAGNOSIS — F331 Major depressive disorder, recurrent, moderate: Secondary | ICD-10-CM | POA: Diagnosis not present

## 2014-12-01 DIAGNOSIS — G40909 Epilepsy, unspecified, not intractable, without status epilepticus: Secondary | ICD-10-CM | POA: Diagnosis not present

## 2014-12-02 DIAGNOSIS — I129 Hypertensive chronic kidney disease with stage 1 through stage 4 chronic kidney disease, or unspecified chronic kidney disease: Secondary | ICD-10-CM | POA: Diagnosis not present

## 2014-12-02 DIAGNOSIS — F321 Major depressive disorder, single episode, moderate: Secondary | ICD-10-CM | POA: Diagnosis not present

## 2014-12-02 DIAGNOSIS — G609 Hereditary and idiopathic neuropathy, unspecified: Secondary | ICD-10-CM | POA: Diagnosis not present

## 2014-12-02 DIAGNOSIS — I252 Old myocardial infarction: Secondary | ICD-10-CM | POA: Diagnosis not present

## 2014-12-02 DIAGNOSIS — Z9181 History of falling: Secondary | ICD-10-CM | POA: Diagnosis not present

## 2014-12-02 DIAGNOSIS — E1165 Type 2 diabetes mellitus with hyperglycemia: Secondary | ICD-10-CM | POA: Diagnosis not present

## 2014-12-02 DIAGNOSIS — G40909 Epilepsy, unspecified, not intractable, without status epilepticus: Secondary | ICD-10-CM | POA: Diagnosis not present

## 2014-12-02 DIAGNOSIS — Z794 Long term (current) use of insulin: Secondary | ICD-10-CM | POA: Diagnosis not present

## 2014-12-02 DIAGNOSIS — N189 Chronic kidney disease, unspecified: Secondary | ICD-10-CM | POA: Diagnosis not present

## 2014-12-06 DIAGNOSIS — Z794 Long term (current) use of insulin: Secondary | ICD-10-CM | POA: Diagnosis not present

## 2014-12-06 DIAGNOSIS — Z9181 History of falling: Secondary | ICD-10-CM | POA: Diagnosis not present

## 2014-12-06 DIAGNOSIS — N189 Chronic kidney disease, unspecified: Secondary | ICD-10-CM | POA: Diagnosis not present

## 2014-12-06 DIAGNOSIS — I129 Hypertensive chronic kidney disease with stage 1 through stage 4 chronic kidney disease, or unspecified chronic kidney disease: Secondary | ICD-10-CM | POA: Diagnosis not present

## 2014-12-06 DIAGNOSIS — E1165 Type 2 diabetes mellitus with hyperglycemia: Secondary | ICD-10-CM | POA: Diagnosis not present

## 2014-12-06 DIAGNOSIS — G40909 Epilepsy, unspecified, not intractable, without status epilepticus: Secondary | ICD-10-CM | POA: Diagnosis not present

## 2014-12-06 DIAGNOSIS — F321 Major depressive disorder, single episode, moderate: Secondary | ICD-10-CM | POA: Diagnosis not present

## 2014-12-06 DIAGNOSIS — G609 Hereditary and idiopathic neuropathy, unspecified: Secondary | ICD-10-CM | POA: Diagnosis not present

## 2014-12-06 DIAGNOSIS — I252 Old myocardial infarction: Secondary | ICD-10-CM | POA: Diagnosis not present

## 2014-12-08 DIAGNOSIS — G609 Hereditary and idiopathic neuropathy, unspecified: Secondary | ICD-10-CM | POA: Diagnosis not present

## 2014-12-08 DIAGNOSIS — Z9181 History of falling: Secondary | ICD-10-CM | POA: Diagnosis not present

## 2014-12-08 DIAGNOSIS — E1165 Type 2 diabetes mellitus with hyperglycemia: Secondary | ICD-10-CM | POA: Diagnosis not present

## 2014-12-08 DIAGNOSIS — F321 Major depressive disorder, single episode, moderate: Secondary | ICD-10-CM | POA: Diagnosis not present

## 2014-12-08 DIAGNOSIS — I252 Old myocardial infarction: Secondary | ICD-10-CM | POA: Diagnosis not present

## 2014-12-08 DIAGNOSIS — Z794 Long term (current) use of insulin: Secondary | ICD-10-CM | POA: Diagnosis not present

## 2014-12-08 DIAGNOSIS — I129 Hypertensive chronic kidney disease with stage 1 through stage 4 chronic kidney disease, or unspecified chronic kidney disease: Secondary | ICD-10-CM | POA: Diagnosis not present

## 2014-12-08 DIAGNOSIS — G40909 Epilepsy, unspecified, not intractable, without status epilepticus: Secondary | ICD-10-CM | POA: Diagnosis not present

## 2014-12-08 DIAGNOSIS — N189 Chronic kidney disease, unspecified: Secondary | ICD-10-CM | POA: Diagnosis not present

## 2014-12-13 DIAGNOSIS — Z9181 History of falling: Secondary | ICD-10-CM | POA: Diagnosis not present

## 2014-12-13 DIAGNOSIS — I129 Hypertensive chronic kidney disease with stage 1 through stage 4 chronic kidney disease, or unspecified chronic kidney disease: Secondary | ICD-10-CM | POA: Diagnosis not present

## 2014-12-13 DIAGNOSIS — Z794 Long term (current) use of insulin: Secondary | ICD-10-CM | POA: Diagnosis not present

## 2014-12-13 DIAGNOSIS — I252 Old myocardial infarction: Secondary | ICD-10-CM | POA: Diagnosis not present

## 2014-12-13 DIAGNOSIS — G40909 Epilepsy, unspecified, not intractable, without status epilepticus: Secondary | ICD-10-CM | POA: Diagnosis not present

## 2014-12-13 DIAGNOSIS — G609 Hereditary and idiopathic neuropathy, unspecified: Secondary | ICD-10-CM | POA: Diagnosis not present

## 2014-12-13 DIAGNOSIS — F321 Major depressive disorder, single episode, moderate: Secondary | ICD-10-CM | POA: Diagnosis not present

## 2014-12-13 DIAGNOSIS — N189 Chronic kidney disease, unspecified: Secondary | ICD-10-CM | POA: Diagnosis not present

## 2014-12-13 DIAGNOSIS — E1165 Type 2 diabetes mellitus with hyperglycemia: Secondary | ICD-10-CM | POA: Diagnosis not present

## 2014-12-15 DIAGNOSIS — I129 Hypertensive chronic kidney disease with stage 1 through stage 4 chronic kidney disease, or unspecified chronic kidney disease: Secondary | ICD-10-CM | POA: Diagnosis not present

## 2014-12-15 DIAGNOSIS — F321 Major depressive disorder, single episode, moderate: Secondary | ICD-10-CM | POA: Diagnosis not present

## 2014-12-15 DIAGNOSIS — Z794 Long term (current) use of insulin: Secondary | ICD-10-CM | POA: Diagnosis not present

## 2014-12-15 DIAGNOSIS — G609 Hereditary and idiopathic neuropathy, unspecified: Secondary | ICD-10-CM | POA: Diagnosis not present

## 2014-12-15 DIAGNOSIS — E1165 Type 2 diabetes mellitus with hyperglycemia: Secondary | ICD-10-CM | POA: Diagnosis not present

## 2014-12-15 DIAGNOSIS — I252 Old myocardial infarction: Secondary | ICD-10-CM | POA: Diagnosis not present

## 2014-12-15 DIAGNOSIS — N189 Chronic kidney disease, unspecified: Secondary | ICD-10-CM | POA: Diagnosis not present

## 2014-12-15 DIAGNOSIS — Z9181 History of falling: Secondary | ICD-10-CM | POA: Diagnosis not present

## 2014-12-15 DIAGNOSIS — G40909 Epilepsy, unspecified, not intractable, without status epilepticus: Secondary | ICD-10-CM | POA: Diagnosis not present

## 2014-12-16 DIAGNOSIS — Z9181 History of falling: Secondary | ICD-10-CM | POA: Diagnosis not present

## 2014-12-16 DIAGNOSIS — G40909 Epilepsy, unspecified, not intractable, without status epilepticus: Secondary | ICD-10-CM | POA: Diagnosis not present

## 2014-12-16 DIAGNOSIS — F321 Major depressive disorder, single episode, moderate: Secondary | ICD-10-CM | POA: Diagnosis not present

## 2014-12-16 DIAGNOSIS — N189 Chronic kidney disease, unspecified: Secondary | ICD-10-CM | POA: Diagnosis not present

## 2014-12-16 DIAGNOSIS — Z794 Long term (current) use of insulin: Secondary | ICD-10-CM | POA: Diagnosis not present

## 2014-12-16 DIAGNOSIS — I129 Hypertensive chronic kidney disease with stage 1 through stage 4 chronic kidney disease, or unspecified chronic kidney disease: Secondary | ICD-10-CM | POA: Diagnosis not present

## 2014-12-16 DIAGNOSIS — E1165 Type 2 diabetes mellitus with hyperglycemia: Secondary | ICD-10-CM | POA: Diagnosis not present

## 2014-12-16 DIAGNOSIS — G609 Hereditary and idiopathic neuropathy, unspecified: Secondary | ICD-10-CM | POA: Diagnosis not present

## 2014-12-16 DIAGNOSIS — I252 Old myocardial infarction: Secondary | ICD-10-CM | POA: Diagnosis not present

## 2014-12-22 DIAGNOSIS — F321 Major depressive disorder, single episode, moderate: Secondary | ICD-10-CM | POA: Diagnosis not present

## 2014-12-22 DIAGNOSIS — E1165 Type 2 diabetes mellitus with hyperglycemia: Secondary | ICD-10-CM | POA: Diagnosis not present

## 2014-12-22 DIAGNOSIS — I252 Old myocardial infarction: Secondary | ICD-10-CM | POA: Diagnosis not present

## 2014-12-22 DIAGNOSIS — I129 Hypertensive chronic kidney disease with stage 1 through stage 4 chronic kidney disease, or unspecified chronic kidney disease: Secondary | ICD-10-CM | POA: Diagnosis not present

## 2014-12-22 DIAGNOSIS — Z9181 History of falling: Secondary | ICD-10-CM | POA: Diagnosis not present

## 2014-12-22 DIAGNOSIS — Z794 Long term (current) use of insulin: Secondary | ICD-10-CM | POA: Diagnosis not present

## 2014-12-22 DIAGNOSIS — G609 Hereditary and idiopathic neuropathy, unspecified: Secondary | ICD-10-CM | POA: Diagnosis not present

## 2014-12-22 DIAGNOSIS — N189 Chronic kidney disease, unspecified: Secondary | ICD-10-CM | POA: Diagnosis not present

## 2014-12-22 DIAGNOSIS — G40909 Epilepsy, unspecified, not intractable, without status epilepticus: Secondary | ICD-10-CM | POA: Diagnosis not present

## 2015-01-19 DIAGNOSIS — M545 Low back pain: Secondary | ICD-10-CM | POA: Diagnosis not present

## 2015-01-19 DIAGNOSIS — M6281 Muscle weakness (generalized): Secondary | ICD-10-CM | POA: Diagnosis not present

## 2015-01-19 DIAGNOSIS — R262 Difficulty in walking, not elsewhere classified: Secondary | ICD-10-CM | POA: Diagnosis not present

## 2015-01-23 DIAGNOSIS — M545 Low back pain: Secondary | ICD-10-CM | POA: Diagnosis not present

## 2015-01-23 DIAGNOSIS — R262 Difficulty in walking, not elsewhere classified: Secondary | ICD-10-CM | POA: Diagnosis not present

## 2015-01-23 DIAGNOSIS — M6281 Muscle weakness (generalized): Secondary | ICD-10-CM | POA: Diagnosis not present

## 2015-01-27 DIAGNOSIS — M6281 Muscle weakness (generalized): Secondary | ICD-10-CM | POA: Diagnosis not present

## 2015-01-27 DIAGNOSIS — R262 Difficulty in walking, not elsewhere classified: Secondary | ICD-10-CM | POA: Diagnosis not present

## 2015-01-27 DIAGNOSIS — M545 Low back pain: Secondary | ICD-10-CM | POA: Diagnosis not present

## 2015-02-03 DIAGNOSIS — R262 Difficulty in walking, not elsewhere classified: Secondary | ICD-10-CM | POA: Diagnosis not present

## 2015-02-03 DIAGNOSIS — M545 Low back pain: Secondary | ICD-10-CM | POA: Diagnosis not present

## 2015-02-03 DIAGNOSIS — M6281 Muscle weakness (generalized): Secondary | ICD-10-CM | POA: Diagnosis not present

## 2015-02-06 DIAGNOSIS — R262 Difficulty in walking, not elsewhere classified: Secondary | ICD-10-CM | POA: Diagnosis not present

## 2015-02-06 DIAGNOSIS — M545 Low back pain: Secondary | ICD-10-CM | POA: Diagnosis not present

## 2015-02-06 DIAGNOSIS — M6281 Muscle weakness (generalized): Secondary | ICD-10-CM | POA: Diagnosis not present

## 2015-02-08 DIAGNOSIS — R262 Difficulty in walking, not elsewhere classified: Secondary | ICD-10-CM | POA: Diagnosis not present

## 2015-02-08 DIAGNOSIS — M6281 Muscle weakness (generalized): Secondary | ICD-10-CM | POA: Diagnosis not present

## 2015-02-08 DIAGNOSIS — M545 Low back pain: Secondary | ICD-10-CM | POA: Diagnosis not present

## 2015-03-17 DIAGNOSIS — Z23 Encounter for immunization: Secondary | ICD-10-CM | POA: Diagnosis not present

## 2015-03-17 DIAGNOSIS — M79672 Pain in left foot: Secondary | ICD-10-CM | POA: Diagnosis not present

## 2015-03-17 DIAGNOSIS — M79671 Pain in right foot: Secondary | ICD-10-CM | POA: Diagnosis not present

## 2015-04-05 DIAGNOSIS — G40909 Epilepsy, unspecified, not intractable, without status epilepticus: Secondary | ICD-10-CM | POA: Diagnosis not present

## 2015-04-05 DIAGNOSIS — F331 Major depressive disorder, recurrent, moderate: Secondary | ICD-10-CM | POA: Diagnosis not present

## 2015-04-05 DIAGNOSIS — E119 Type 2 diabetes mellitus without complications: Secondary | ICD-10-CM | POA: Diagnosis not present

## 2015-04-05 DIAGNOSIS — I10 Essential (primary) hypertension: Secondary | ICD-10-CM | POA: Diagnosis not present

## 2015-04-05 DIAGNOSIS — E6609 Other obesity due to excess calories: Secondary | ICD-10-CM | POA: Diagnosis not present

## 2015-04-11 DIAGNOSIS — M1288 Other specific arthropathies, not elsewhere classified, other specified site: Secondary | ICD-10-CM | POA: Diagnosis not present

## 2015-04-11 DIAGNOSIS — M47816 Spondylosis without myelopathy or radiculopathy, lumbar region: Secondary | ICD-10-CM | POA: Diagnosis not present

## 2015-04-11 DIAGNOSIS — Z111 Encounter for screening for respiratory tuberculosis: Secondary | ICD-10-CM | POA: Diagnosis not present

## 2015-05-19 DIAGNOSIS — M47816 Spondylosis without myelopathy or radiculopathy, lumbar region: Secondary | ICD-10-CM | POA: Diagnosis not present

## 2015-05-19 DIAGNOSIS — M545 Low back pain: Secondary | ICD-10-CM | POA: Diagnosis not present

## 2015-06-22 DIAGNOSIS — K219 Gastro-esophageal reflux disease without esophagitis: Secondary | ICD-10-CM | POA: Diagnosis not present

## 2015-06-22 DIAGNOSIS — M545 Low back pain: Secondary | ICD-10-CM | POA: Diagnosis not present

## 2015-06-22 DIAGNOSIS — Z7902 Long term (current) use of antithrombotics/antiplatelets: Secondary | ICD-10-CM | POA: Diagnosis not present

## 2015-06-22 DIAGNOSIS — M47816 Spondylosis without myelopathy or radiculopathy, lumbar region: Secondary | ICD-10-CM | POA: Diagnosis not present

## 2015-06-22 DIAGNOSIS — E785 Hyperlipidemia, unspecified: Secondary | ICD-10-CM | POA: Diagnosis not present

## 2015-06-22 DIAGNOSIS — E119 Type 2 diabetes mellitus without complications: Secondary | ICD-10-CM | POA: Diagnosis not present

## 2015-06-22 DIAGNOSIS — Z794 Long term (current) use of insulin: Secondary | ICD-10-CM | POA: Diagnosis not present

## 2015-06-22 DIAGNOSIS — Z981 Arthrodesis status: Secondary | ICD-10-CM | POA: Diagnosis not present

## 2015-06-22 DIAGNOSIS — Z79899 Other long term (current) drug therapy: Secondary | ICD-10-CM | POA: Diagnosis not present

## 2015-06-22 DIAGNOSIS — Z7982 Long term (current) use of aspirin: Secondary | ICD-10-CM | POA: Diagnosis not present

## 2015-06-22 DIAGNOSIS — Z886 Allergy status to analgesic agent status: Secondary | ICD-10-CM | POA: Diagnosis not present

## 2015-07-05 DIAGNOSIS — R51 Headache: Secondary | ICD-10-CM | POA: Diagnosis not present

## 2015-07-05 DIAGNOSIS — S0003XA Contusion of scalp, initial encounter: Secondary | ICD-10-CM | POA: Diagnosis not present

## 2015-07-10 DIAGNOSIS — M47816 Spondylosis without myelopathy or radiculopathy, lumbar region: Secondary | ICD-10-CM | POA: Diagnosis not present

## 2015-07-19 DIAGNOSIS — Z981 Arthrodesis status: Secondary | ICD-10-CM | POA: Diagnosis not present

## 2015-07-19 DIAGNOSIS — Z79899 Other long term (current) drug therapy: Secondary | ICD-10-CM | POA: Diagnosis not present

## 2015-07-19 DIAGNOSIS — E785 Hyperlipidemia, unspecified: Secondary | ICD-10-CM | POA: Diagnosis not present

## 2015-07-19 DIAGNOSIS — Z7902 Long term (current) use of antithrombotics/antiplatelets: Secondary | ICD-10-CM | POA: Diagnosis not present

## 2015-07-19 DIAGNOSIS — Z794 Long term (current) use of insulin: Secondary | ICD-10-CM | POA: Diagnosis not present

## 2015-07-19 DIAGNOSIS — K219 Gastro-esophageal reflux disease without esophagitis: Secondary | ICD-10-CM | POA: Diagnosis not present

## 2015-07-19 DIAGNOSIS — M545 Low back pain: Secondary | ICD-10-CM | POA: Diagnosis not present

## 2015-07-19 DIAGNOSIS — M47816 Spondylosis without myelopathy or radiculopathy, lumbar region: Secondary | ICD-10-CM | POA: Diagnosis not present

## 2015-07-19 DIAGNOSIS — Z886 Allergy status to analgesic agent status: Secondary | ICD-10-CM | POA: Diagnosis not present

## 2015-07-19 DIAGNOSIS — E119 Type 2 diabetes mellitus without complications: Secondary | ICD-10-CM | POA: Diagnosis not present

## 2015-07-19 DIAGNOSIS — Z7982 Long term (current) use of aspirin: Secondary | ICD-10-CM | POA: Diagnosis not present

## 2015-07-26 DIAGNOSIS — I10 Essential (primary) hypertension: Secondary | ICD-10-CM | POA: Diagnosis not present

## 2015-07-26 DIAGNOSIS — E7800 Pure hypercholesterolemia, unspecified: Secondary | ICD-10-CM | POA: Diagnosis not present

## 2015-07-26 DIAGNOSIS — E11649 Type 2 diabetes mellitus with hypoglycemia without coma: Secondary | ICD-10-CM | POA: Diagnosis not present

## 2015-07-31 DIAGNOSIS — E6609 Other obesity due to excess calories: Secondary | ICD-10-CM | POA: Diagnosis not present

## 2015-07-31 DIAGNOSIS — I10 Essential (primary) hypertension: Secondary | ICD-10-CM | POA: Diagnosis not present

## 2015-07-31 DIAGNOSIS — F324 Major depressive disorder, single episode, in partial remission: Secondary | ICD-10-CM | POA: Diagnosis not present

## 2015-07-31 DIAGNOSIS — G40909 Epilepsy, unspecified, not intractable, without status epilepticus: Secondary | ICD-10-CM | POA: Diagnosis not present

## 2015-07-31 DIAGNOSIS — E1122 Type 2 diabetes mellitus with diabetic chronic kidney disease: Secondary | ICD-10-CM | POA: Diagnosis not present

## 2015-09-17 DIAGNOSIS — G40909 Epilepsy, unspecified, not intractable, without status epilepticus: Secondary | ICD-10-CM | POA: Diagnosis not present

## 2015-09-17 DIAGNOSIS — E1122 Type 2 diabetes mellitus with diabetic chronic kidney disease: Secondary | ICD-10-CM | POA: Diagnosis not present

## 2015-09-20 DIAGNOSIS — I10 Essential (primary) hypertension: Secondary | ICD-10-CM | POA: Diagnosis not present

## 2015-09-20 DIAGNOSIS — E119 Type 2 diabetes mellitus without complications: Secondary | ICD-10-CM | POA: Diagnosis not present

## 2015-09-20 DIAGNOSIS — E785 Hyperlipidemia, unspecified: Secondary | ICD-10-CM | POA: Diagnosis not present

## 2015-10-04 DIAGNOSIS — M5136 Other intervertebral disc degeneration, lumbar region: Secondary | ICD-10-CM | POA: Diagnosis not present

## 2015-10-04 DIAGNOSIS — M1288 Other specific arthropathies, not elsewhere classified, other specified site: Secondary | ICD-10-CM | POA: Diagnosis not present

## 2015-10-04 DIAGNOSIS — M47816 Spondylosis without myelopathy or radiculopathy, lumbar region: Secondary | ICD-10-CM | POA: Diagnosis not present

## 2015-10-04 DIAGNOSIS — M810 Age-related osteoporosis without current pathological fracture: Secondary | ICD-10-CM | POA: Diagnosis not present

## 2015-10-05 IMAGING — CT CT HEAD W/O CM
1 series · 16 of 30 positions shown, 20 images · non-contrast
Comparison: MRI of the head performed 12/22/2013 at [REDACTED]

CLINICAL DATA: Status epilepticus. History of bipolar 1 disorder,
diabetes, and migraine.

EXAM:
CT HEAD WITHOUT CONTRAST
TECHNIQUE: Contiguous axial images were obtained from the base of the skull
through the vertex without contrast.

[Series 2: head 5.0 h30s · axial · 0.45mm/px · z∈[-58,+77]mm · 16 of 31 slices shown, 20 images]
[im 2/31  brain]
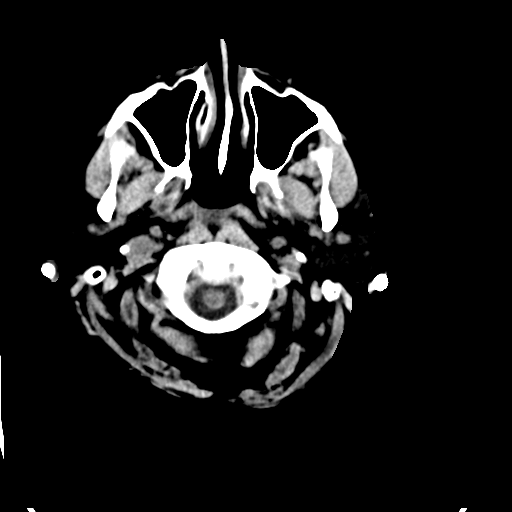
[im 2/31  bone]
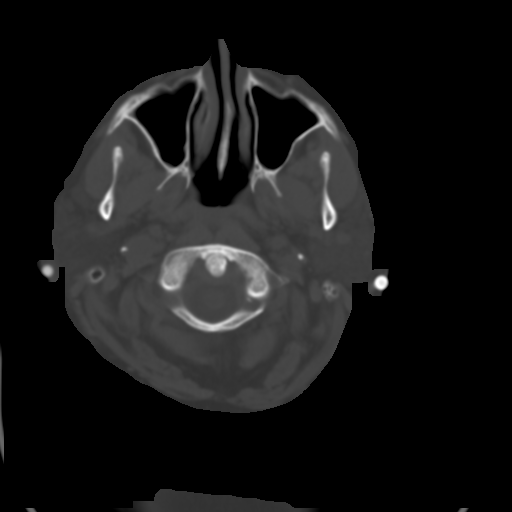
[im 4/31  brain]
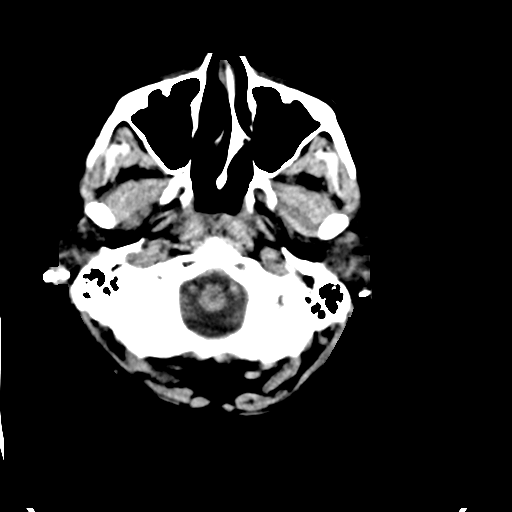
[im 6/31  brain]
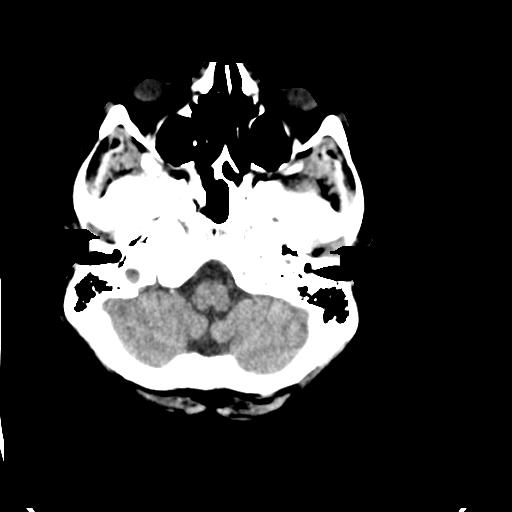
[im 8/31  brain]
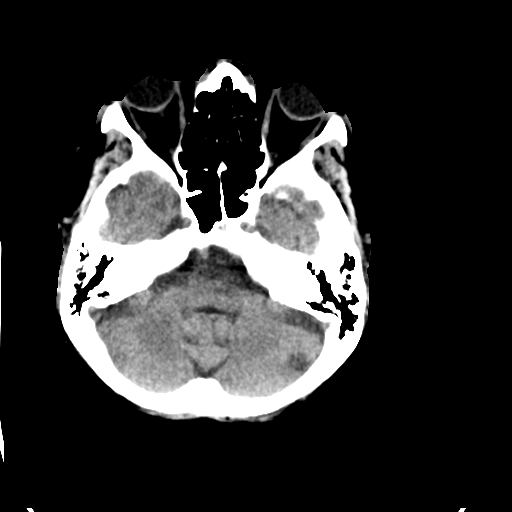
[im 9/31  brain]
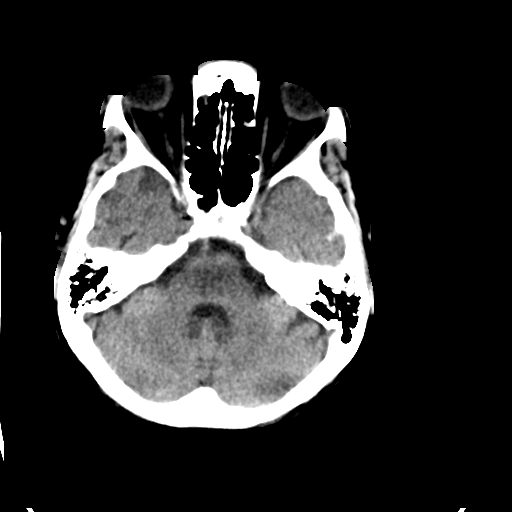
[im 9/31  bone]
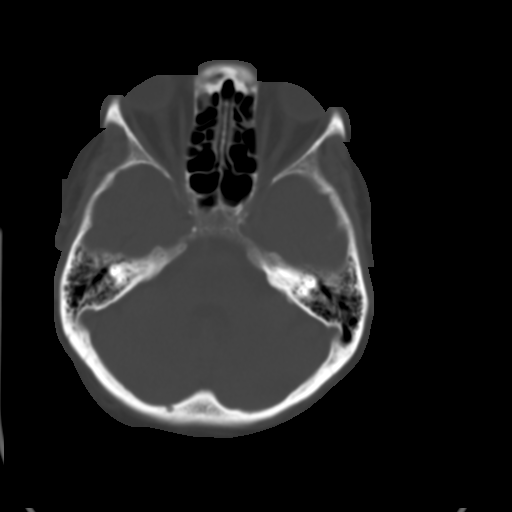
[im 11/31  brain]
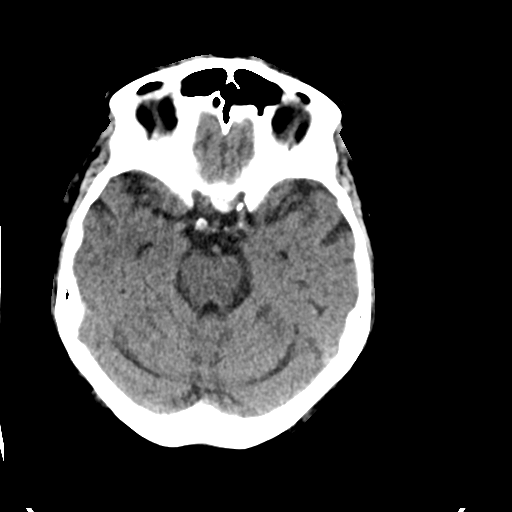
[im 13/31  brain]
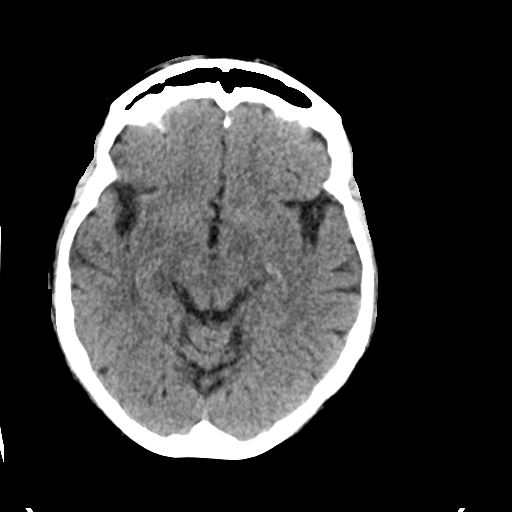
[im 15/31  brain]
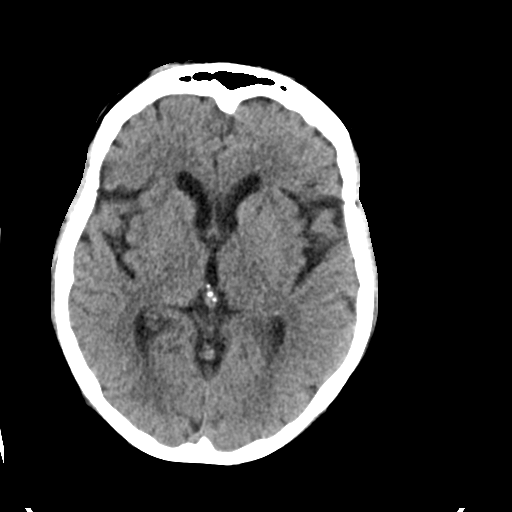
[im 16/31  brain]
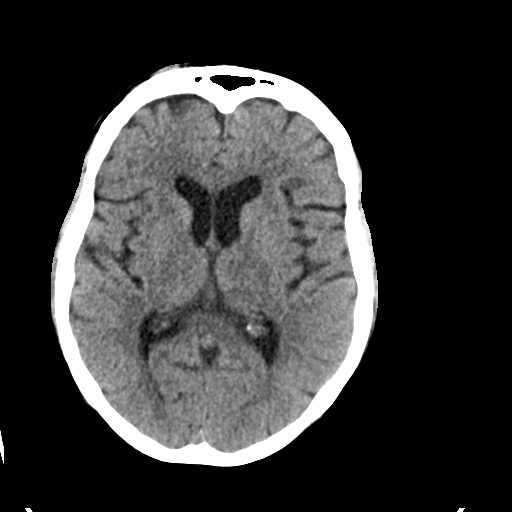
[im 16/31  bone]
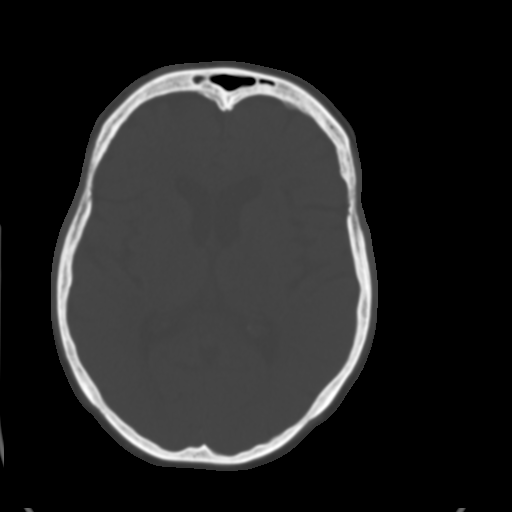
[im 18/31  brain]
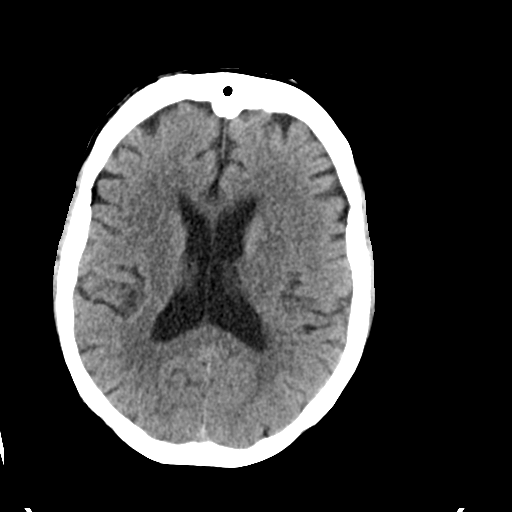
[im 20/31  brain]
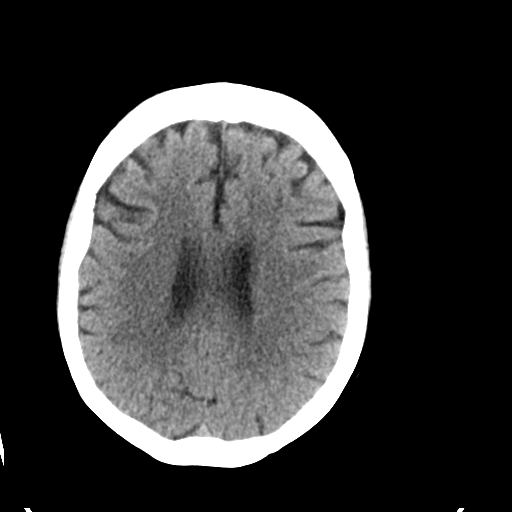
[im 22/31  brain]
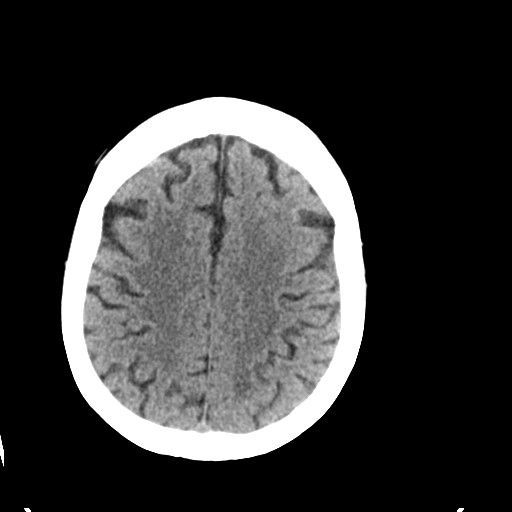
[im 23/31  brain]
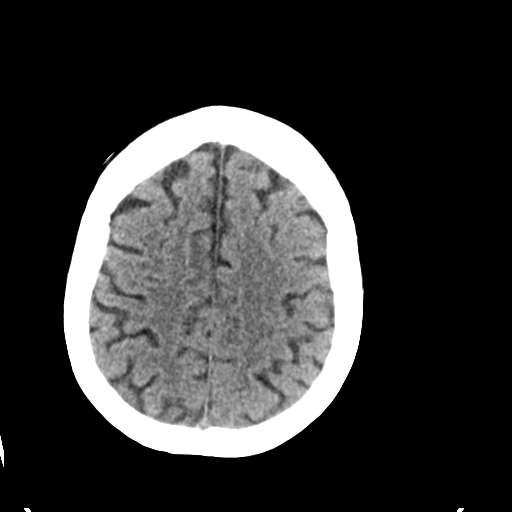
[im 23/31  bone]
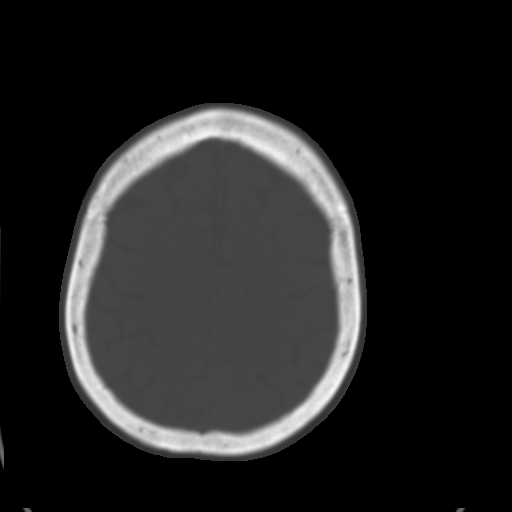
[im 25/31  brain]
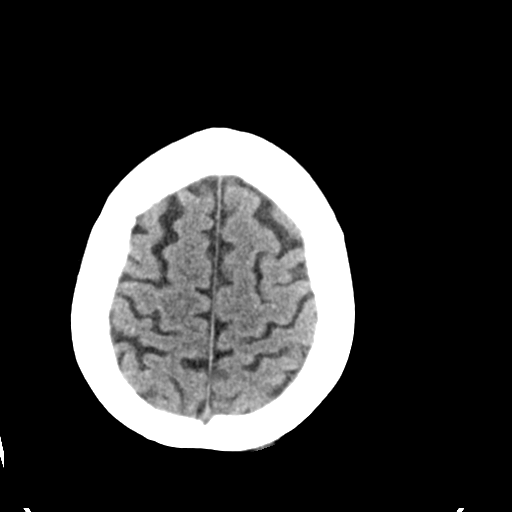
[im 27/31  brain]
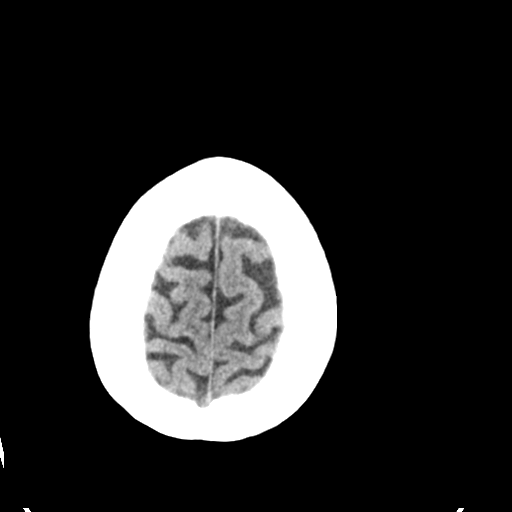
[im 29/31  brain]
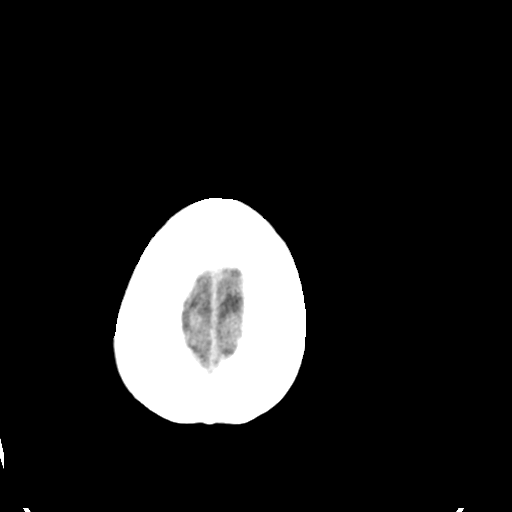

[16 of 30 positions shown; findings below may reference images not displayed]

FINDINGS: No evidence for acute infarction, hemorrhage, mass lesion,
hydrocephalus, or extra-axial fluid. Generalized atrophy.
Hypoattenuation of the white matter consistent with previously
identified small vessel disease. Calvarium intact. Vascular
calcification. No acute sinus disease. Trace layering effusion in
the LEFT mastoid. Slight RIGHT frontal scalp soft tissue swelling
was present on the previous MRI, and could represent a chronic
inflammatory process.
IMPRESSION: Chronic changes as described similar to previous MR. no acute
intracranial abnormality is detected.

## 2015-10-06 IMAGING — CT CT HEAD W/O CM
2 series · 16 of 30 positions shown, 20 images · non-contrast
Comparison: 03/20/2014

CLINICAL DATA: Seizure

EXAM:
CT HEAD WITHOUT CONTRAST
TECHNIQUE: Contiguous axial images were obtained from the base of the skull
through the vertex without intravenous contrast.

[Series 201: head w/o, idose (1) · axial · non-contrast · 0.49mm/px · z∈[+98,+218]mm · 13 of 30 slices shown, 17 images]
[im 3/30  brain]
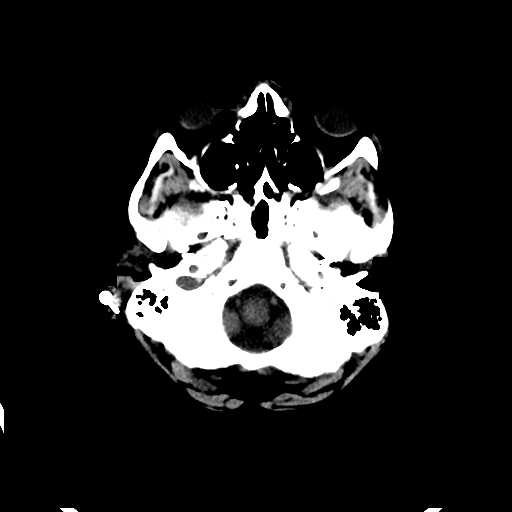
[im 3/30  bone]
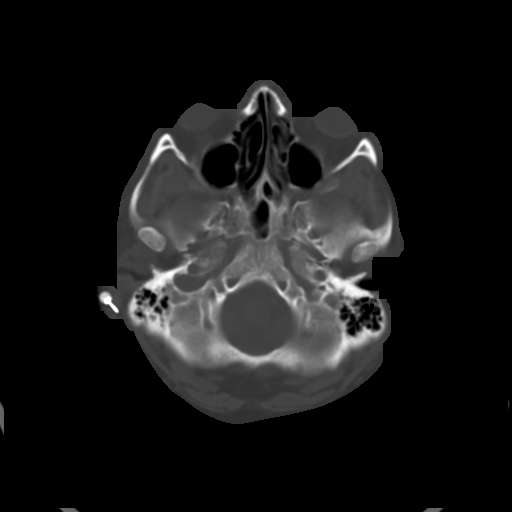
[im 5/30  brain]
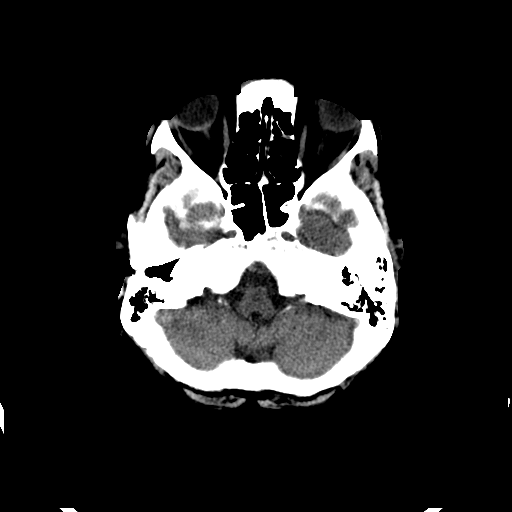
[im 7/30  brain]
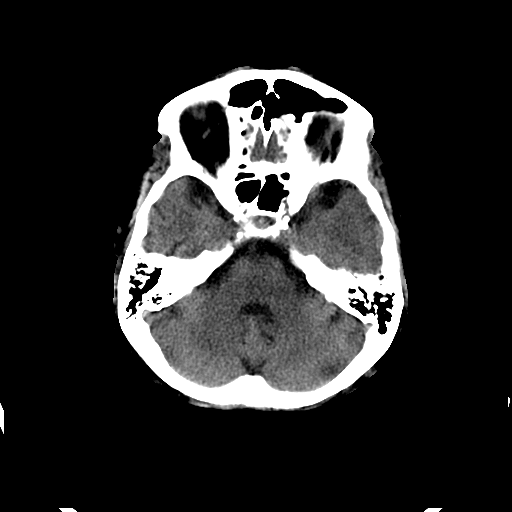
[im 9/30  brain]
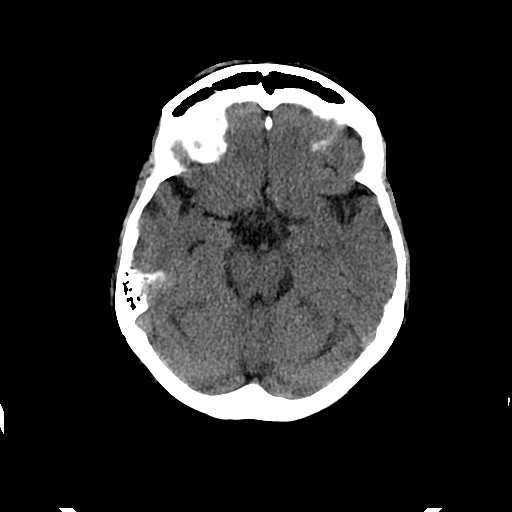
[im 11/30  brain]
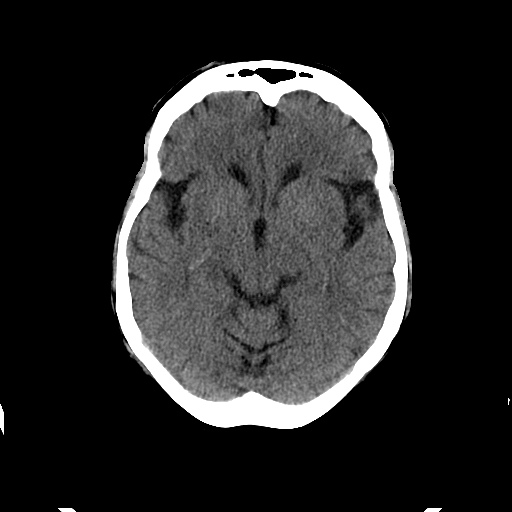
[im 11/30  bone]
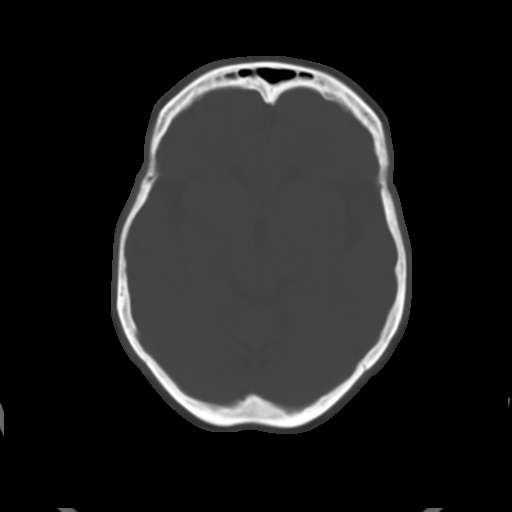
[im 13/30  brain]
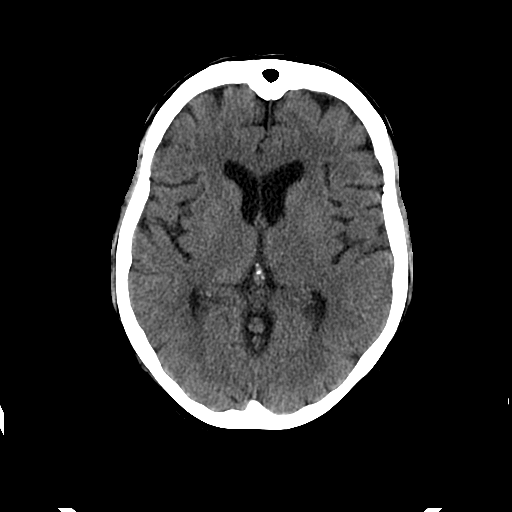
[im 15/30  brain]
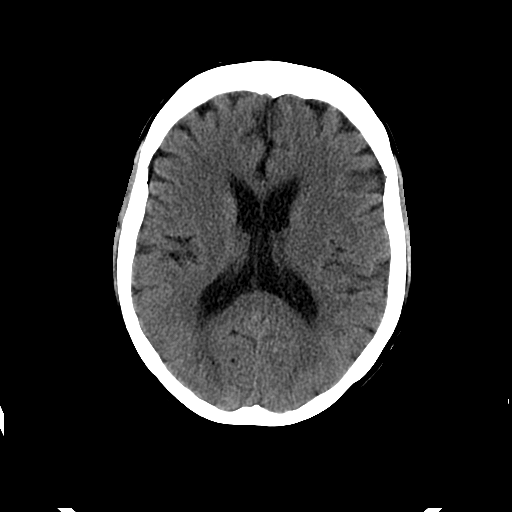
[im 17/30  brain]
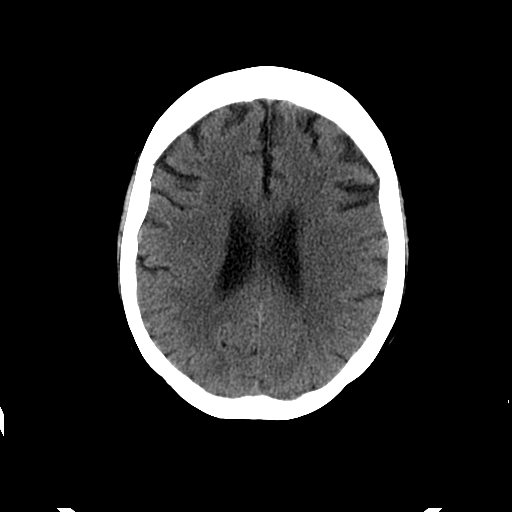
[im 19/30  brain]
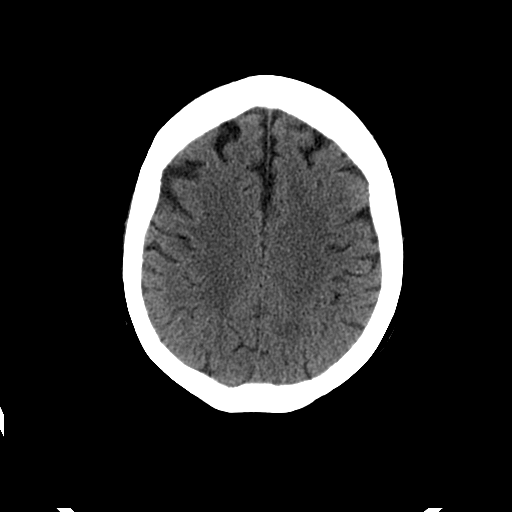
[im 19/30  bone]
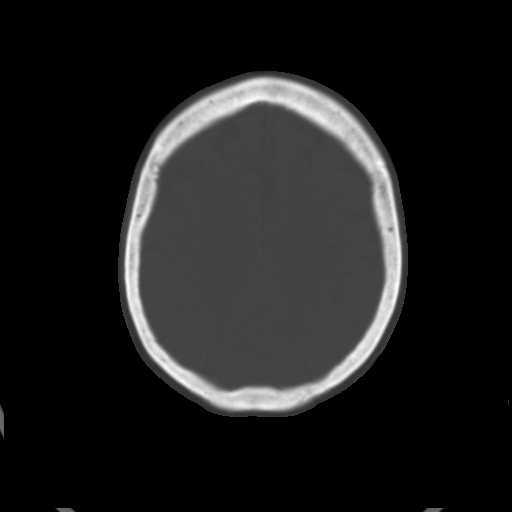
[im 21/30  brain]
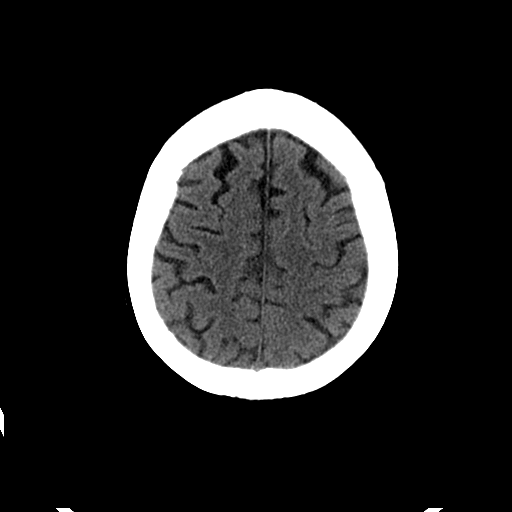
[im 23/30  brain]
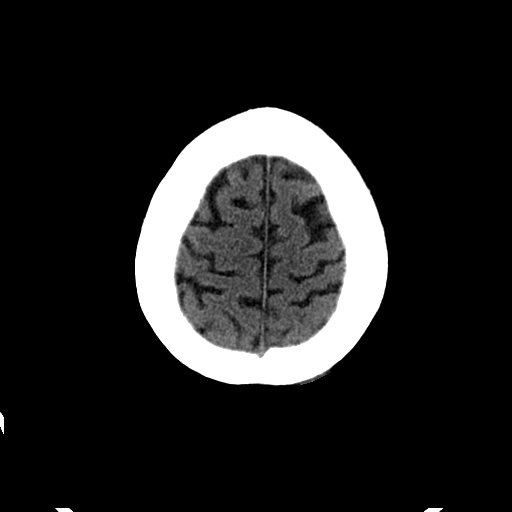
[im 25/30  brain]
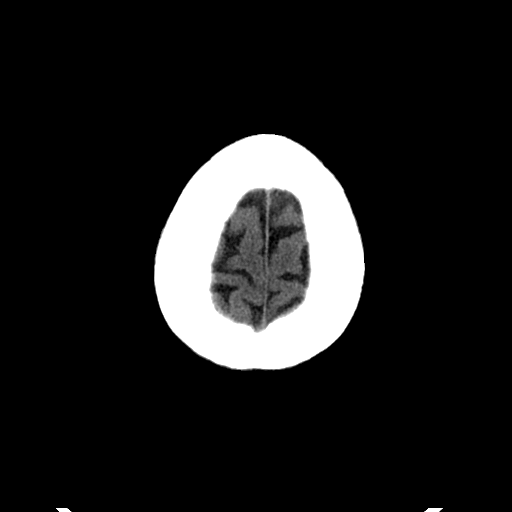
[im 27/30  brain]
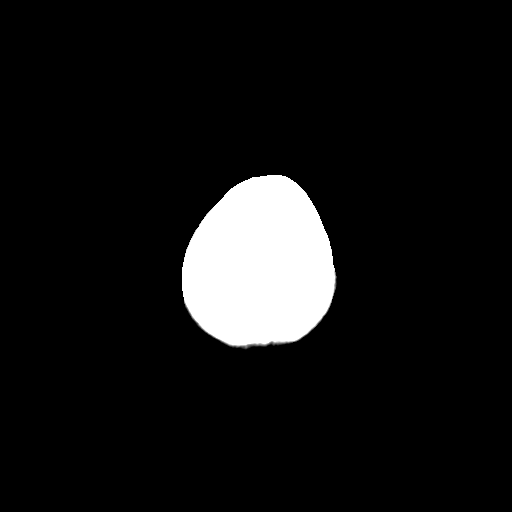
[im 27/30  bone]
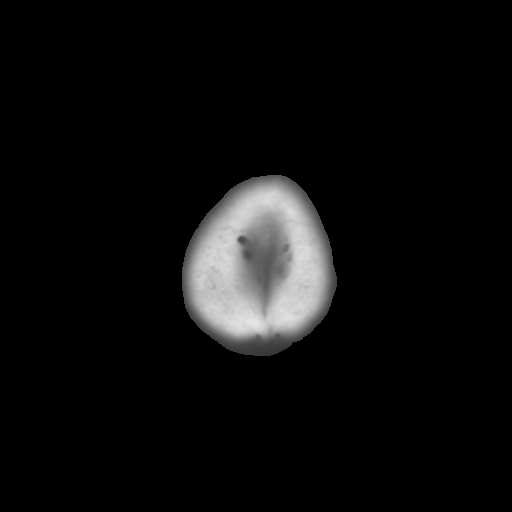

[Series 202: head w/o bone, idose (1) · axial · non-contrast · 0.49mm/px · z∈[+98,+138]mm · 3 of 30 slices shown]
[im 3/30  bone]
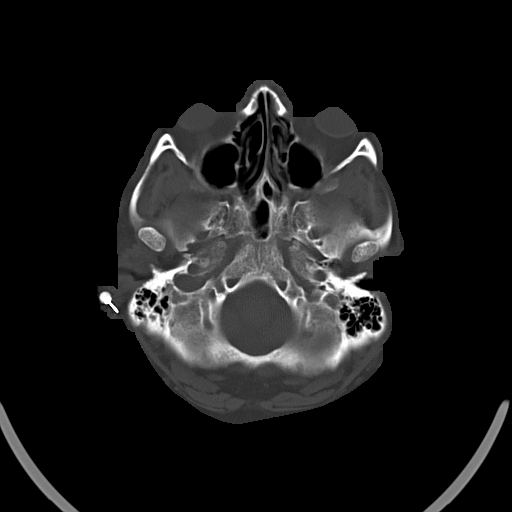
[im 7/30  bone]
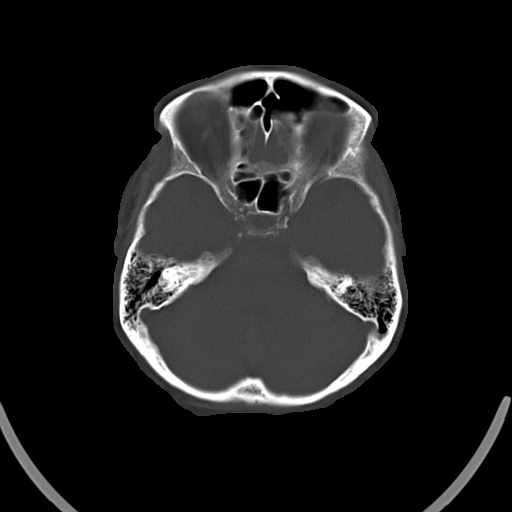
[im 11/30  bone]
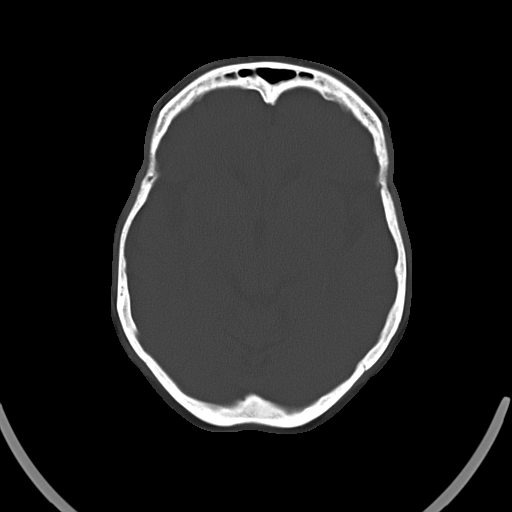

[16 of 30 positions shown; findings below may reference images not displayed]

FINDINGS: Ventricle size is normal. Mild chronic microvascular ischemic change
in the white matter.

Negative for acute infarct. Negative for hemorrhage or mass.
Calvarium intact.
IMPRESSION: No acute abnormality.

## 2015-10-15 DIAGNOSIS — E1169 Type 2 diabetes mellitus with other specified complication: Secondary | ICD-10-CM | POA: Diagnosis not present

## 2015-10-24 DIAGNOSIS — M1288 Other specific arthropathies, not elsewhere classified, other specified site: Secondary | ICD-10-CM | POA: Diagnosis not present

## 2015-10-24 DIAGNOSIS — M47816 Spondylosis without myelopathy or radiculopathy, lumbar region: Secondary | ICD-10-CM | POA: Diagnosis not present

## 2015-10-25 DIAGNOSIS — E785 Hyperlipidemia, unspecified: Secondary | ICD-10-CM | POA: Diagnosis not present

## 2015-10-25 DIAGNOSIS — I10 Essential (primary) hypertension: Secondary | ICD-10-CM | POA: Diagnosis not present

## 2015-10-25 DIAGNOSIS — G40911 Epilepsy, unspecified, intractable, with status epilepticus: Secondary | ICD-10-CM | POA: Diagnosis not present

## 2015-11-08 DIAGNOSIS — R8299 Other abnormal findings in urine: Secondary | ICD-10-CM | POA: Diagnosis not present

## 2015-11-08 DIAGNOSIS — R350 Frequency of micturition: Secondary | ICD-10-CM | POA: Diagnosis not present

## 2015-11-08 DIAGNOSIS — I10 Essential (primary) hypertension: Secondary | ICD-10-CM | POA: Diagnosis not present

## 2015-11-08 DIAGNOSIS — N390 Urinary tract infection, site not specified: Secondary | ICD-10-CM | POA: Diagnosis not present

## 2015-11-09 DIAGNOSIS — N10 Acute tubulo-interstitial nephritis: Secondary | ICD-10-CM | POA: Diagnosis not present

## 2015-11-10 DIAGNOSIS — I10 Essential (primary) hypertension: Secondary | ICD-10-CM | POA: Diagnosis not present

## 2015-11-21 DIAGNOSIS — E1151 Type 2 diabetes mellitus with diabetic peripheral angiopathy without gangrene: Secondary | ICD-10-CM | POA: Diagnosis not present

## 2015-12-07 DIAGNOSIS — G40909 Epilepsy, unspecified, not intractable, without status epilepticus: Secondary | ICD-10-CM | POA: Diagnosis not present

## 2015-12-07 DIAGNOSIS — E1169 Type 2 diabetes mellitus with other specified complication: Secondary | ICD-10-CM | POA: Diagnosis not present

## 2015-12-18 DIAGNOSIS — I10 Essential (primary) hypertension: Secondary | ICD-10-CM | POA: Diagnosis not present

## 2015-12-18 DIAGNOSIS — G40911 Epilepsy, unspecified, intractable, with status epilepticus: Secondary | ICD-10-CM | POA: Diagnosis not present

## 2015-12-18 DIAGNOSIS — M545 Low back pain: Secondary | ICD-10-CM | POA: Diagnosis not present

## 2015-12-18 DIAGNOSIS — E11620 Type 2 diabetes mellitus with diabetic dermatitis: Secondary | ICD-10-CM | POA: Diagnosis not present

## 2015-12-18 DIAGNOSIS — M6281 Muscle weakness (generalized): Secondary | ICD-10-CM | POA: Diagnosis not present

## 2015-12-18 DIAGNOSIS — E785 Hyperlipidemia, unspecified: Secondary | ICD-10-CM | POA: Diagnosis not present

## 2015-12-19 DIAGNOSIS — G40911 Epilepsy, unspecified, intractable, with status epilepticus: Secondary | ICD-10-CM | POA: Diagnosis not present

## 2015-12-19 DIAGNOSIS — M545 Low back pain: Secondary | ICD-10-CM | POA: Diagnosis not present

## 2015-12-19 DIAGNOSIS — E785 Hyperlipidemia, unspecified: Secondary | ICD-10-CM | POA: Diagnosis not present

## 2015-12-19 DIAGNOSIS — E11620 Type 2 diabetes mellitus with diabetic dermatitis: Secondary | ICD-10-CM | POA: Diagnosis not present

## 2015-12-19 DIAGNOSIS — I10 Essential (primary) hypertension: Secondary | ICD-10-CM | POA: Diagnosis not present

## 2015-12-19 DIAGNOSIS — M6281 Muscle weakness (generalized): Secondary | ICD-10-CM | POA: Diagnosis not present

## 2015-12-20 DIAGNOSIS — M545 Low back pain: Secondary | ICD-10-CM | POA: Diagnosis not present

## 2015-12-20 DIAGNOSIS — G40911 Epilepsy, unspecified, intractable, with status epilepticus: Secondary | ICD-10-CM | POA: Diagnosis not present

## 2015-12-20 DIAGNOSIS — E11620 Type 2 diabetes mellitus with diabetic dermatitis: Secondary | ICD-10-CM | POA: Diagnosis not present

## 2015-12-20 DIAGNOSIS — E785 Hyperlipidemia, unspecified: Secondary | ICD-10-CM | POA: Diagnosis not present

## 2015-12-20 DIAGNOSIS — M6281 Muscle weakness (generalized): Secondary | ICD-10-CM | POA: Diagnosis not present

## 2015-12-20 DIAGNOSIS — I10 Essential (primary) hypertension: Secondary | ICD-10-CM | POA: Diagnosis not present

## 2015-12-21 DIAGNOSIS — E785 Hyperlipidemia, unspecified: Secondary | ICD-10-CM | POA: Diagnosis not present

## 2015-12-21 DIAGNOSIS — E11620 Type 2 diabetes mellitus with diabetic dermatitis: Secondary | ICD-10-CM | POA: Diagnosis not present

## 2015-12-21 DIAGNOSIS — M6281 Muscle weakness (generalized): Secondary | ICD-10-CM | POA: Diagnosis not present

## 2015-12-21 DIAGNOSIS — M545 Low back pain: Secondary | ICD-10-CM | POA: Diagnosis not present

## 2015-12-21 DIAGNOSIS — G40911 Epilepsy, unspecified, intractable, with status epilepticus: Secondary | ICD-10-CM | POA: Diagnosis not present

## 2015-12-21 DIAGNOSIS — I10 Essential (primary) hypertension: Secondary | ICD-10-CM | POA: Diagnosis not present

## 2015-12-22 DIAGNOSIS — M6281 Muscle weakness (generalized): Secondary | ICD-10-CM | POA: Diagnosis not present

## 2015-12-22 DIAGNOSIS — M545 Low back pain: Secondary | ICD-10-CM | POA: Diagnosis not present

## 2015-12-22 DIAGNOSIS — G40911 Epilepsy, unspecified, intractable, with status epilepticus: Secondary | ICD-10-CM | POA: Diagnosis not present

## 2015-12-22 DIAGNOSIS — E785 Hyperlipidemia, unspecified: Secondary | ICD-10-CM | POA: Diagnosis not present

## 2015-12-22 DIAGNOSIS — I10 Essential (primary) hypertension: Secondary | ICD-10-CM | POA: Diagnosis not present

## 2015-12-22 DIAGNOSIS — E11620 Type 2 diabetes mellitus with diabetic dermatitis: Secondary | ICD-10-CM | POA: Diagnosis not present

## 2015-12-25 DIAGNOSIS — M545 Low back pain: Secondary | ICD-10-CM | POA: Diagnosis not present

## 2015-12-25 DIAGNOSIS — G40911 Epilepsy, unspecified, intractable, with status epilepticus: Secondary | ICD-10-CM | POA: Diagnosis not present

## 2015-12-25 DIAGNOSIS — E785 Hyperlipidemia, unspecified: Secondary | ICD-10-CM | POA: Diagnosis not present

## 2015-12-25 DIAGNOSIS — M6281 Muscle weakness (generalized): Secondary | ICD-10-CM | POA: Diagnosis not present

## 2015-12-25 DIAGNOSIS — E11620 Type 2 diabetes mellitus with diabetic dermatitis: Secondary | ICD-10-CM | POA: Diagnosis not present

## 2015-12-25 DIAGNOSIS — I10 Essential (primary) hypertension: Secondary | ICD-10-CM | POA: Diagnosis not present

## 2015-12-26 DIAGNOSIS — E785 Hyperlipidemia, unspecified: Secondary | ICD-10-CM | POA: Diagnosis not present

## 2015-12-26 DIAGNOSIS — G40911 Epilepsy, unspecified, intractable, with status epilepticus: Secondary | ICD-10-CM | POA: Diagnosis not present

## 2015-12-26 DIAGNOSIS — M6281 Muscle weakness (generalized): Secondary | ICD-10-CM | POA: Diagnosis not present

## 2015-12-26 DIAGNOSIS — M545 Low back pain: Secondary | ICD-10-CM | POA: Diagnosis not present

## 2015-12-26 DIAGNOSIS — I10 Essential (primary) hypertension: Secondary | ICD-10-CM | POA: Diagnosis not present

## 2015-12-26 DIAGNOSIS — E11620 Type 2 diabetes mellitus with diabetic dermatitis: Secondary | ICD-10-CM | POA: Diagnosis not present

## 2015-12-27 DIAGNOSIS — I10 Essential (primary) hypertension: Secondary | ICD-10-CM | POA: Diagnosis not present

## 2015-12-27 DIAGNOSIS — E785 Hyperlipidemia, unspecified: Secondary | ICD-10-CM | POA: Diagnosis not present

## 2015-12-27 DIAGNOSIS — E11620 Type 2 diabetes mellitus with diabetic dermatitis: Secondary | ICD-10-CM | POA: Diagnosis not present

## 2015-12-27 DIAGNOSIS — G40911 Epilepsy, unspecified, intractable, with status epilepticus: Secondary | ICD-10-CM | POA: Diagnosis not present

## 2015-12-27 DIAGNOSIS — M6281 Muscle weakness (generalized): Secondary | ICD-10-CM | POA: Diagnosis not present

## 2015-12-27 DIAGNOSIS — M545 Low back pain: Secondary | ICD-10-CM | POA: Diagnosis not present

## 2015-12-28 DIAGNOSIS — M6281 Muscle weakness (generalized): Secondary | ICD-10-CM | POA: Diagnosis not present

## 2015-12-28 DIAGNOSIS — E785 Hyperlipidemia, unspecified: Secondary | ICD-10-CM | POA: Diagnosis not present

## 2015-12-28 DIAGNOSIS — E11620 Type 2 diabetes mellitus with diabetic dermatitis: Secondary | ICD-10-CM | POA: Diagnosis not present

## 2015-12-28 DIAGNOSIS — G40911 Epilepsy, unspecified, intractable, with status epilepticus: Secondary | ICD-10-CM | POA: Diagnosis not present

## 2015-12-28 DIAGNOSIS — I10 Essential (primary) hypertension: Secondary | ICD-10-CM | POA: Diagnosis not present

## 2015-12-28 DIAGNOSIS — M545 Low back pain: Secondary | ICD-10-CM | POA: Diagnosis not present

## 2015-12-29 DIAGNOSIS — I10 Essential (primary) hypertension: Secondary | ICD-10-CM | POA: Diagnosis not present

## 2015-12-29 DIAGNOSIS — E11620 Type 2 diabetes mellitus with diabetic dermatitis: Secondary | ICD-10-CM | POA: Diagnosis not present

## 2015-12-29 DIAGNOSIS — G40911 Epilepsy, unspecified, intractable, with status epilepticus: Secondary | ICD-10-CM | POA: Diagnosis not present

## 2015-12-29 DIAGNOSIS — M6281 Muscle weakness (generalized): Secondary | ICD-10-CM | POA: Diagnosis not present

## 2015-12-29 DIAGNOSIS — E785 Hyperlipidemia, unspecified: Secondary | ICD-10-CM | POA: Diagnosis not present

## 2015-12-29 DIAGNOSIS — M545 Low back pain: Secondary | ICD-10-CM | POA: Diagnosis not present

## 2016-01-01 DIAGNOSIS — M545 Low back pain: Secondary | ICD-10-CM | POA: Diagnosis not present

## 2016-01-01 DIAGNOSIS — I10 Essential (primary) hypertension: Secondary | ICD-10-CM | POA: Diagnosis not present

## 2016-01-01 DIAGNOSIS — E11620 Type 2 diabetes mellitus with diabetic dermatitis: Secondary | ICD-10-CM | POA: Diagnosis not present

## 2016-01-01 DIAGNOSIS — G40911 Epilepsy, unspecified, intractable, with status epilepticus: Secondary | ICD-10-CM | POA: Diagnosis not present

## 2016-01-01 DIAGNOSIS — M6281 Muscle weakness (generalized): Secondary | ICD-10-CM | POA: Diagnosis not present

## 2016-01-01 DIAGNOSIS — E785 Hyperlipidemia, unspecified: Secondary | ICD-10-CM | POA: Diagnosis not present

## 2016-01-02 DIAGNOSIS — E785 Hyperlipidemia, unspecified: Secondary | ICD-10-CM | POA: Diagnosis not present

## 2016-01-02 DIAGNOSIS — M6281 Muscle weakness (generalized): Secondary | ICD-10-CM | POA: Diagnosis not present

## 2016-01-02 DIAGNOSIS — M545 Low back pain: Secondary | ICD-10-CM | POA: Diagnosis not present

## 2016-01-02 DIAGNOSIS — G40911 Epilepsy, unspecified, intractable, with status epilepticus: Secondary | ICD-10-CM | POA: Diagnosis not present

## 2016-01-02 DIAGNOSIS — E11620 Type 2 diabetes mellitus with diabetic dermatitis: Secondary | ICD-10-CM | POA: Diagnosis not present

## 2016-01-02 DIAGNOSIS — I10 Essential (primary) hypertension: Secondary | ICD-10-CM | POA: Diagnosis not present

## 2016-01-03 DIAGNOSIS — M6281 Muscle weakness (generalized): Secondary | ICD-10-CM | POA: Diagnosis not present

## 2016-01-03 DIAGNOSIS — E785 Hyperlipidemia, unspecified: Secondary | ICD-10-CM | POA: Diagnosis not present

## 2016-01-03 DIAGNOSIS — I10 Essential (primary) hypertension: Secondary | ICD-10-CM | POA: Diagnosis not present

## 2016-01-03 DIAGNOSIS — G40911 Epilepsy, unspecified, intractable, with status epilepticus: Secondary | ICD-10-CM | POA: Diagnosis not present

## 2016-01-03 DIAGNOSIS — E11620 Type 2 diabetes mellitus with diabetic dermatitis: Secondary | ICD-10-CM | POA: Diagnosis not present

## 2016-01-03 DIAGNOSIS — M545 Low back pain: Secondary | ICD-10-CM | POA: Diagnosis not present

## 2016-01-04 DIAGNOSIS — I10 Essential (primary) hypertension: Secondary | ICD-10-CM | POA: Diagnosis not present

## 2016-01-04 DIAGNOSIS — M6281 Muscle weakness (generalized): Secondary | ICD-10-CM | POA: Diagnosis not present

## 2016-01-04 DIAGNOSIS — E785 Hyperlipidemia, unspecified: Secondary | ICD-10-CM | POA: Diagnosis not present

## 2016-01-04 DIAGNOSIS — M545 Low back pain: Secondary | ICD-10-CM | POA: Diagnosis not present

## 2016-01-04 DIAGNOSIS — E11620 Type 2 diabetes mellitus with diabetic dermatitis: Secondary | ICD-10-CM | POA: Diagnosis not present

## 2016-01-04 DIAGNOSIS — G40911 Epilepsy, unspecified, intractable, with status epilepticus: Secondary | ICD-10-CM | POA: Diagnosis not present

## 2016-01-05 DIAGNOSIS — M6281 Muscle weakness (generalized): Secondary | ICD-10-CM | POA: Diagnosis not present

## 2016-01-05 DIAGNOSIS — M545 Low back pain: Secondary | ICD-10-CM | POA: Diagnosis not present

## 2016-01-05 DIAGNOSIS — G40911 Epilepsy, unspecified, intractable, with status epilepticus: Secondary | ICD-10-CM | POA: Diagnosis not present

## 2016-01-05 DIAGNOSIS — E11620 Type 2 diabetes mellitus with diabetic dermatitis: Secondary | ICD-10-CM | POA: Diagnosis not present

## 2016-01-05 DIAGNOSIS — I10 Essential (primary) hypertension: Secondary | ICD-10-CM | POA: Diagnosis not present

## 2016-01-05 DIAGNOSIS — E785 Hyperlipidemia, unspecified: Secondary | ICD-10-CM | POA: Diagnosis not present

## 2016-01-08 DIAGNOSIS — E11620 Type 2 diabetes mellitus with diabetic dermatitis: Secondary | ICD-10-CM | POA: Diagnosis not present

## 2016-01-08 DIAGNOSIS — M545 Low back pain: Secondary | ICD-10-CM | POA: Diagnosis not present

## 2016-01-08 DIAGNOSIS — M6281 Muscle weakness (generalized): Secondary | ICD-10-CM | POA: Diagnosis not present

## 2016-01-08 DIAGNOSIS — E785 Hyperlipidemia, unspecified: Secondary | ICD-10-CM | POA: Diagnosis not present

## 2016-01-08 DIAGNOSIS — I10 Essential (primary) hypertension: Secondary | ICD-10-CM | POA: Diagnosis not present

## 2016-01-08 DIAGNOSIS — G40911 Epilepsy, unspecified, intractable, with status epilepticus: Secondary | ICD-10-CM | POA: Diagnosis not present

## 2016-01-10 DIAGNOSIS — E785 Hyperlipidemia, unspecified: Secondary | ICD-10-CM | POA: Diagnosis not present

## 2016-01-10 DIAGNOSIS — M6281 Muscle weakness (generalized): Secondary | ICD-10-CM | POA: Diagnosis not present

## 2016-01-10 DIAGNOSIS — G40911 Epilepsy, unspecified, intractable, with status epilepticus: Secondary | ICD-10-CM | POA: Diagnosis not present

## 2016-01-10 DIAGNOSIS — M545 Low back pain: Secondary | ICD-10-CM | POA: Diagnosis not present

## 2016-01-10 DIAGNOSIS — I10 Essential (primary) hypertension: Secondary | ICD-10-CM | POA: Diagnosis not present

## 2016-01-10 DIAGNOSIS — E11620 Type 2 diabetes mellitus with diabetic dermatitis: Secondary | ICD-10-CM | POA: Diagnosis not present

## 2016-01-11 DIAGNOSIS — E11620 Type 2 diabetes mellitus with diabetic dermatitis: Secondary | ICD-10-CM | POA: Diagnosis not present

## 2016-01-11 DIAGNOSIS — E785 Hyperlipidemia, unspecified: Secondary | ICD-10-CM | POA: Diagnosis not present

## 2016-01-11 DIAGNOSIS — M6281 Muscle weakness (generalized): Secondary | ICD-10-CM | POA: Diagnosis not present

## 2016-01-11 DIAGNOSIS — G40911 Epilepsy, unspecified, intractable, with status epilepticus: Secondary | ICD-10-CM | POA: Diagnosis not present

## 2016-01-11 DIAGNOSIS — I10 Essential (primary) hypertension: Secondary | ICD-10-CM | POA: Diagnosis not present

## 2016-01-11 DIAGNOSIS — M545 Low back pain: Secondary | ICD-10-CM | POA: Diagnosis not present

## 2016-01-12 DIAGNOSIS — I10 Essential (primary) hypertension: Secondary | ICD-10-CM | POA: Diagnosis not present

## 2016-01-12 DIAGNOSIS — E11620 Type 2 diabetes mellitus with diabetic dermatitis: Secondary | ICD-10-CM | POA: Diagnosis not present

## 2016-01-12 DIAGNOSIS — M6281 Muscle weakness (generalized): Secondary | ICD-10-CM | POA: Diagnosis not present

## 2016-01-12 DIAGNOSIS — G40911 Epilepsy, unspecified, intractable, with status epilepticus: Secondary | ICD-10-CM | POA: Diagnosis not present

## 2016-01-12 DIAGNOSIS — M545 Low back pain: Secondary | ICD-10-CM | POA: Diagnosis not present

## 2016-01-12 DIAGNOSIS — E785 Hyperlipidemia, unspecified: Secondary | ICD-10-CM | POA: Diagnosis not present

## 2016-01-13 DIAGNOSIS — M545 Low back pain: Secondary | ICD-10-CM | POA: Diagnosis not present

## 2016-01-13 DIAGNOSIS — E11620 Type 2 diabetes mellitus with diabetic dermatitis: Secondary | ICD-10-CM | POA: Diagnosis not present

## 2016-01-13 DIAGNOSIS — G40911 Epilepsy, unspecified, intractable, with status epilepticus: Secondary | ICD-10-CM | POA: Diagnosis not present

## 2016-01-13 DIAGNOSIS — E785 Hyperlipidemia, unspecified: Secondary | ICD-10-CM | POA: Diagnosis not present

## 2016-01-13 DIAGNOSIS — I10 Essential (primary) hypertension: Secondary | ICD-10-CM | POA: Diagnosis not present

## 2016-01-13 DIAGNOSIS — M6281 Muscle weakness (generalized): Secondary | ICD-10-CM | POA: Diagnosis not present

## 2016-01-15 DIAGNOSIS — M6281 Muscle weakness (generalized): Secondary | ICD-10-CM | POA: Diagnosis not present

## 2016-01-15 DIAGNOSIS — E11620 Type 2 diabetes mellitus with diabetic dermatitis: Secondary | ICD-10-CM | POA: Diagnosis not present

## 2016-01-15 DIAGNOSIS — E785 Hyperlipidemia, unspecified: Secondary | ICD-10-CM | POA: Diagnosis not present

## 2016-01-15 DIAGNOSIS — M545 Low back pain: Secondary | ICD-10-CM | POA: Diagnosis not present

## 2016-01-15 DIAGNOSIS — G40911 Epilepsy, unspecified, intractable, with status epilepticus: Secondary | ICD-10-CM | POA: Diagnosis not present

## 2016-01-15 DIAGNOSIS — I10 Essential (primary) hypertension: Secondary | ICD-10-CM | POA: Diagnosis not present

## 2016-01-16 DIAGNOSIS — E785 Hyperlipidemia, unspecified: Secondary | ICD-10-CM | POA: Diagnosis not present

## 2016-01-16 DIAGNOSIS — I10 Essential (primary) hypertension: Secondary | ICD-10-CM | POA: Diagnosis not present

## 2016-01-16 DIAGNOSIS — M6281 Muscle weakness (generalized): Secondary | ICD-10-CM | POA: Diagnosis not present

## 2016-01-16 DIAGNOSIS — E11620 Type 2 diabetes mellitus with diabetic dermatitis: Secondary | ICD-10-CM | POA: Diagnosis not present

## 2016-01-16 DIAGNOSIS — M545 Low back pain: Secondary | ICD-10-CM | POA: Diagnosis not present

## 2016-01-17 DIAGNOSIS — M6281 Muscle weakness (generalized): Secondary | ICD-10-CM | POA: Diagnosis not present

## 2016-01-17 DIAGNOSIS — M545 Low back pain: Secondary | ICD-10-CM | POA: Diagnosis not present

## 2016-01-17 DIAGNOSIS — E785 Hyperlipidemia, unspecified: Secondary | ICD-10-CM | POA: Diagnosis not present

## 2016-01-17 DIAGNOSIS — I10 Essential (primary) hypertension: Secondary | ICD-10-CM | POA: Diagnosis not present

## 2016-01-17 DIAGNOSIS — E11620 Type 2 diabetes mellitus with diabetic dermatitis: Secondary | ICD-10-CM | POA: Diagnosis not present

## 2016-01-18 DIAGNOSIS — E11620 Type 2 diabetes mellitus with diabetic dermatitis: Secondary | ICD-10-CM | POA: Diagnosis not present

## 2016-01-18 DIAGNOSIS — I10 Essential (primary) hypertension: Secondary | ICD-10-CM | POA: Diagnosis not present

## 2016-01-18 DIAGNOSIS — M545 Low back pain: Secondary | ICD-10-CM | POA: Diagnosis not present

## 2016-01-18 DIAGNOSIS — M6281 Muscle weakness (generalized): Secondary | ICD-10-CM | POA: Diagnosis not present

## 2016-01-18 DIAGNOSIS — E785 Hyperlipidemia, unspecified: Secondary | ICD-10-CM | POA: Diagnosis not present

## 2016-01-19 DIAGNOSIS — E785 Hyperlipidemia, unspecified: Secondary | ICD-10-CM | POA: Diagnosis not present

## 2016-01-19 DIAGNOSIS — M6281 Muscle weakness (generalized): Secondary | ICD-10-CM | POA: Diagnosis not present

## 2016-01-19 DIAGNOSIS — M545 Low back pain: Secondary | ICD-10-CM | POA: Diagnosis not present

## 2016-01-19 DIAGNOSIS — I10 Essential (primary) hypertension: Secondary | ICD-10-CM | POA: Diagnosis not present

## 2016-01-19 DIAGNOSIS — E11620 Type 2 diabetes mellitus with diabetic dermatitis: Secondary | ICD-10-CM | POA: Diagnosis not present

## 2016-01-21 DIAGNOSIS — E1169 Type 2 diabetes mellitus with other specified complication: Secondary | ICD-10-CM | POA: Diagnosis not present

## 2016-01-22 DIAGNOSIS — I10 Essential (primary) hypertension: Secondary | ICD-10-CM | POA: Diagnosis not present

## 2016-01-22 DIAGNOSIS — E11620 Type 2 diabetes mellitus with diabetic dermatitis: Secondary | ICD-10-CM | POA: Diagnosis not present

## 2016-01-22 DIAGNOSIS — M6281 Muscle weakness (generalized): Secondary | ICD-10-CM | POA: Diagnosis not present

## 2016-01-22 DIAGNOSIS — M545 Low back pain: Secondary | ICD-10-CM | POA: Diagnosis not present

## 2016-01-22 DIAGNOSIS — E785 Hyperlipidemia, unspecified: Secondary | ICD-10-CM | POA: Diagnosis not present

## 2016-01-23 DIAGNOSIS — M6281 Muscle weakness (generalized): Secondary | ICD-10-CM | POA: Diagnosis not present

## 2016-01-23 DIAGNOSIS — I10 Essential (primary) hypertension: Secondary | ICD-10-CM | POA: Diagnosis not present

## 2016-01-23 DIAGNOSIS — E11620 Type 2 diabetes mellitus with diabetic dermatitis: Secondary | ICD-10-CM | POA: Diagnosis not present

## 2016-01-23 DIAGNOSIS — B351 Tinea unguium: Secondary | ICD-10-CM | POA: Diagnosis not present

## 2016-01-23 DIAGNOSIS — M545 Low back pain: Secondary | ICD-10-CM | POA: Diagnosis not present

## 2016-01-23 DIAGNOSIS — E785 Hyperlipidemia, unspecified: Secondary | ICD-10-CM | POA: Diagnosis not present

## 2016-01-23 DIAGNOSIS — M79675 Pain in left toe(s): Secondary | ICD-10-CM | POA: Diagnosis not present

## 2016-01-24 DIAGNOSIS — I10 Essential (primary) hypertension: Secondary | ICD-10-CM | POA: Diagnosis not present

## 2016-01-24 DIAGNOSIS — M6281 Muscle weakness (generalized): Secondary | ICD-10-CM | POA: Diagnosis not present

## 2016-01-24 DIAGNOSIS — E11620 Type 2 diabetes mellitus with diabetic dermatitis: Secondary | ICD-10-CM | POA: Diagnosis not present

## 2016-01-24 DIAGNOSIS — M545 Low back pain: Secondary | ICD-10-CM | POA: Diagnosis not present

## 2016-01-24 DIAGNOSIS — E785 Hyperlipidemia, unspecified: Secondary | ICD-10-CM | POA: Diagnosis not present

## 2016-01-25 DIAGNOSIS — E785 Hyperlipidemia, unspecified: Secondary | ICD-10-CM | POA: Diagnosis not present

## 2016-01-25 DIAGNOSIS — I10 Essential (primary) hypertension: Secondary | ICD-10-CM | POA: Diagnosis not present

## 2016-01-25 DIAGNOSIS — M545 Low back pain: Secondary | ICD-10-CM | POA: Diagnosis not present

## 2016-01-25 DIAGNOSIS — M6281 Muscle weakness (generalized): Secondary | ICD-10-CM | POA: Diagnosis not present

## 2016-01-25 DIAGNOSIS — E11620 Type 2 diabetes mellitus with diabetic dermatitis: Secondary | ICD-10-CM | POA: Diagnosis not present

## 2016-01-26 DIAGNOSIS — M545 Low back pain: Secondary | ICD-10-CM | POA: Diagnosis not present

## 2016-01-26 DIAGNOSIS — E785 Hyperlipidemia, unspecified: Secondary | ICD-10-CM | POA: Diagnosis not present

## 2016-01-26 DIAGNOSIS — M6281 Muscle weakness (generalized): Secondary | ICD-10-CM | POA: Diagnosis not present

## 2016-01-26 DIAGNOSIS — E11620 Type 2 diabetes mellitus with diabetic dermatitis: Secondary | ICD-10-CM | POA: Diagnosis not present

## 2016-01-26 DIAGNOSIS — I10 Essential (primary) hypertension: Secondary | ICD-10-CM | POA: Diagnosis not present

## 2016-01-29 DIAGNOSIS — E785 Hyperlipidemia, unspecified: Secondary | ICD-10-CM | POA: Diagnosis not present

## 2016-01-29 DIAGNOSIS — E11620 Type 2 diabetes mellitus with diabetic dermatitis: Secondary | ICD-10-CM | POA: Diagnosis not present

## 2016-01-29 DIAGNOSIS — M6281 Muscle weakness (generalized): Secondary | ICD-10-CM | POA: Diagnosis not present

## 2016-01-29 DIAGNOSIS — M545 Low back pain: Secondary | ICD-10-CM | POA: Diagnosis not present

## 2016-01-29 DIAGNOSIS — I10 Essential (primary) hypertension: Secondary | ICD-10-CM | POA: Diagnosis not present

## 2016-01-30 DIAGNOSIS — M6281 Muscle weakness (generalized): Secondary | ICD-10-CM | POA: Diagnosis not present

## 2016-01-30 DIAGNOSIS — M545 Low back pain: Secondary | ICD-10-CM | POA: Diagnosis not present

## 2016-01-30 DIAGNOSIS — E785 Hyperlipidemia, unspecified: Secondary | ICD-10-CM | POA: Diagnosis not present

## 2016-01-30 DIAGNOSIS — I10 Essential (primary) hypertension: Secondary | ICD-10-CM | POA: Diagnosis not present

## 2016-01-30 DIAGNOSIS — E11620 Type 2 diabetes mellitus with diabetic dermatitis: Secondary | ICD-10-CM | POA: Diagnosis not present

## 2016-01-31 DIAGNOSIS — E11620 Type 2 diabetes mellitus with diabetic dermatitis: Secondary | ICD-10-CM | POA: Diagnosis not present

## 2016-01-31 DIAGNOSIS — E785 Hyperlipidemia, unspecified: Secondary | ICD-10-CM | POA: Diagnosis not present

## 2016-01-31 DIAGNOSIS — M6281 Muscle weakness (generalized): Secondary | ICD-10-CM | POA: Diagnosis not present

## 2016-01-31 DIAGNOSIS — M545 Low back pain: Secondary | ICD-10-CM | POA: Diagnosis not present

## 2016-01-31 DIAGNOSIS — I10 Essential (primary) hypertension: Secondary | ICD-10-CM | POA: Diagnosis not present

## 2016-02-01 DIAGNOSIS — E785 Hyperlipidemia, unspecified: Secondary | ICD-10-CM | POA: Diagnosis not present

## 2016-02-01 DIAGNOSIS — E11620 Type 2 diabetes mellitus with diabetic dermatitis: Secondary | ICD-10-CM | POA: Diagnosis not present

## 2016-02-01 DIAGNOSIS — M545 Low back pain: Secondary | ICD-10-CM | POA: Diagnosis not present

## 2016-02-01 DIAGNOSIS — I10 Essential (primary) hypertension: Secondary | ICD-10-CM | POA: Diagnosis not present

## 2016-02-01 DIAGNOSIS — M6281 Muscle weakness (generalized): Secondary | ICD-10-CM | POA: Diagnosis not present

## 2016-02-02 DIAGNOSIS — E11620 Type 2 diabetes mellitus with diabetic dermatitis: Secondary | ICD-10-CM | POA: Diagnosis not present

## 2016-02-02 DIAGNOSIS — I10 Essential (primary) hypertension: Secondary | ICD-10-CM | POA: Diagnosis not present

## 2016-02-02 DIAGNOSIS — Z5181 Encounter for therapeutic drug level monitoring: Secondary | ICD-10-CM | POA: Diagnosis not present

## 2016-02-02 DIAGNOSIS — M545 Low back pain: Secondary | ICD-10-CM | POA: Diagnosis not present

## 2016-02-02 DIAGNOSIS — M6281 Muscle weakness (generalized): Secondary | ICD-10-CM | POA: Diagnosis not present

## 2016-02-02 DIAGNOSIS — E785 Hyperlipidemia, unspecified: Secondary | ICD-10-CM | POA: Diagnosis not present

## 2016-04-05 DIAGNOSIS — M79675 Pain in left toe(s): Secondary | ICD-10-CM | POA: Diagnosis not present

## 2016-04-05 DIAGNOSIS — B351 Tinea unguium: Secondary | ICD-10-CM | POA: Diagnosis not present

## 2016-05-31 DIAGNOSIS — E119 Type 2 diabetes mellitus without complications: Secondary | ICD-10-CM | POA: Diagnosis not present

## 2016-05-31 DIAGNOSIS — I10 Essential (primary) hypertension: Secondary | ICD-10-CM | POA: Diagnosis not present

## 2016-07-07 DIAGNOSIS — R2681 Unsteadiness on feet: Secondary | ICD-10-CM | POA: Diagnosis not present

## 2016-07-07 DIAGNOSIS — R296 Repeated falls: Secondary | ICD-10-CM | POA: Diagnosis not present

## 2016-07-09 DIAGNOSIS — I10 Essential (primary) hypertension: Secondary | ICD-10-CM | POA: Diagnosis not present

## 2016-07-09 DIAGNOSIS — E790 Hyperuricemia without signs of inflammatory arthritis and tophaceous disease: Secondary | ICD-10-CM | POA: Diagnosis not present

## 2016-07-09 DIAGNOSIS — M1711 Unilateral primary osteoarthritis, right knee: Secondary | ICD-10-CM | POA: Diagnosis not present

## 2016-07-17 DIAGNOSIS — M6281 Muscle weakness (generalized): Secondary | ICD-10-CM | POA: Diagnosis not present

## 2016-07-17 DIAGNOSIS — I10 Essential (primary) hypertension: Secondary | ICD-10-CM | POA: Diagnosis not present

## 2016-07-17 DIAGNOSIS — M545 Low back pain: Secondary | ICD-10-CM | POA: Diagnosis not present

## 2016-07-17 DIAGNOSIS — E11620 Type 2 diabetes mellitus with diabetic dermatitis: Secondary | ICD-10-CM | POA: Diagnosis not present

## 2016-07-17 DIAGNOSIS — E785 Hyperlipidemia, unspecified: Secondary | ICD-10-CM | POA: Diagnosis not present

## 2016-07-18 DIAGNOSIS — M545 Low back pain: Secondary | ICD-10-CM | POA: Diagnosis not present

## 2016-07-18 DIAGNOSIS — E11620 Type 2 diabetes mellitus with diabetic dermatitis: Secondary | ICD-10-CM | POA: Diagnosis not present

## 2016-07-18 DIAGNOSIS — M6281 Muscle weakness (generalized): Secondary | ICD-10-CM | POA: Diagnosis not present

## 2016-07-18 DIAGNOSIS — I10 Essential (primary) hypertension: Secondary | ICD-10-CM | POA: Diagnosis not present

## 2016-07-18 DIAGNOSIS — E785 Hyperlipidemia, unspecified: Secondary | ICD-10-CM | POA: Diagnosis not present

## 2016-07-19 DIAGNOSIS — E11620 Type 2 diabetes mellitus with diabetic dermatitis: Secondary | ICD-10-CM | POA: Diagnosis not present

## 2016-07-19 DIAGNOSIS — M6281 Muscle weakness (generalized): Secondary | ICD-10-CM | POA: Diagnosis not present

## 2016-07-19 DIAGNOSIS — E785 Hyperlipidemia, unspecified: Secondary | ICD-10-CM | POA: Diagnosis not present

## 2016-07-19 DIAGNOSIS — M545 Low back pain: Secondary | ICD-10-CM | POA: Diagnosis not present

## 2016-07-19 DIAGNOSIS — I10 Essential (primary) hypertension: Secondary | ICD-10-CM | POA: Diagnosis not present

## 2016-07-22 DIAGNOSIS — E785 Hyperlipidemia, unspecified: Secondary | ICD-10-CM | POA: Diagnosis not present

## 2016-07-22 DIAGNOSIS — M545 Low back pain: Secondary | ICD-10-CM | POA: Diagnosis not present

## 2016-07-22 DIAGNOSIS — M6281 Muscle weakness (generalized): Secondary | ICD-10-CM | POA: Diagnosis not present

## 2016-07-22 DIAGNOSIS — E11620 Type 2 diabetes mellitus with diabetic dermatitis: Secondary | ICD-10-CM | POA: Diagnosis not present

## 2016-07-22 DIAGNOSIS — I10 Essential (primary) hypertension: Secondary | ICD-10-CM | POA: Diagnosis not present

## 2016-07-23 DIAGNOSIS — M6281 Muscle weakness (generalized): Secondary | ICD-10-CM | POA: Diagnosis not present

## 2016-07-23 DIAGNOSIS — E785 Hyperlipidemia, unspecified: Secondary | ICD-10-CM | POA: Diagnosis not present

## 2016-07-23 DIAGNOSIS — E11620 Type 2 diabetes mellitus with diabetic dermatitis: Secondary | ICD-10-CM | POA: Diagnosis not present

## 2016-07-23 DIAGNOSIS — M545 Low back pain: Secondary | ICD-10-CM | POA: Diagnosis not present

## 2016-07-23 DIAGNOSIS — I10 Essential (primary) hypertension: Secondary | ICD-10-CM | POA: Diagnosis not present

## 2016-07-23 DIAGNOSIS — E1151 Type 2 diabetes mellitus with diabetic peripheral angiopathy without gangrene: Secondary | ICD-10-CM | POA: Diagnosis not present

## 2016-07-24 DIAGNOSIS — M545 Low back pain: Secondary | ICD-10-CM | POA: Diagnosis not present

## 2016-07-24 DIAGNOSIS — M6281 Muscle weakness (generalized): Secondary | ICD-10-CM | POA: Diagnosis not present

## 2016-07-24 DIAGNOSIS — I10 Essential (primary) hypertension: Secondary | ICD-10-CM | POA: Diagnosis not present

## 2016-07-24 DIAGNOSIS — E11620 Type 2 diabetes mellitus with diabetic dermatitis: Secondary | ICD-10-CM | POA: Diagnosis not present

## 2016-07-24 DIAGNOSIS — E785 Hyperlipidemia, unspecified: Secondary | ICD-10-CM | POA: Diagnosis not present

## 2016-07-29 DIAGNOSIS — E785 Hyperlipidemia, unspecified: Secondary | ICD-10-CM | POA: Diagnosis not present

## 2016-07-29 DIAGNOSIS — M6281 Muscle weakness (generalized): Secondary | ICD-10-CM | POA: Diagnosis not present

## 2016-07-29 DIAGNOSIS — I10 Essential (primary) hypertension: Secondary | ICD-10-CM | POA: Diagnosis not present

## 2016-07-29 DIAGNOSIS — Z961 Presence of intraocular lens: Secondary | ICD-10-CM | POA: Diagnosis not present

## 2016-07-29 DIAGNOSIS — E119 Type 2 diabetes mellitus without complications: Secondary | ICD-10-CM | POA: Diagnosis not present

## 2016-07-29 DIAGNOSIS — E11620 Type 2 diabetes mellitus with diabetic dermatitis: Secondary | ICD-10-CM | POA: Diagnosis not present

## 2016-07-29 DIAGNOSIS — M545 Low back pain: Secondary | ICD-10-CM | POA: Diagnosis not present

## 2016-07-29 DIAGNOSIS — Z794 Long term (current) use of insulin: Secondary | ICD-10-CM | POA: Diagnosis not present

## 2016-08-02 DIAGNOSIS — I10 Essential (primary) hypertension: Secondary | ICD-10-CM | POA: Diagnosis not present

## 2016-08-02 DIAGNOSIS — E785 Hyperlipidemia, unspecified: Secondary | ICD-10-CM | POA: Diagnosis not present

## 2016-08-02 DIAGNOSIS — Z5181 Encounter for therapeutic drug level monitoring: Secondary | ICD-10-CM | POA: Diagnosis not present

## 2016-08-02 DIAGNOSIS — M6281 Muscle weakness (generalized): Secondary | ICD-10-CM | POA: Diagnosis not present

## 2016-08-02 DIAGNOSIS — E11620 Type 2 diabetes mellitus with diabetic dermatitis: Secondary | ICD-10-CM | POA: Diagnosis not present

## 2016-08-02 DIAGNOSIS — M545 Low back pain: Secondary | ICD-10-CM | POA: Diagnosis not present

## 2016-08-05 DIAGNOSIS — E11620 Type 2 diabetes mellitus with diabetic dermatitis: Secondary | ICD-10-CM | POA: Diagnosis not present

## 2016-08-05 DIAGNOSIS — E785 Hyperlipidemia, unspecified: Secondary | ICD-10-CM | POA: Diagnosis not present

## 2016-08-05 DIAGNOSIS — M6281 Muscle weakness (generalized): Secondary | ICD-10-CM | POA: Diagnosis not present

## 2016-08-05 DIAGNOSIS — I10 Essential (primary) hypertension: Secondary | ICD-10-CM | POA: Diagnosis not present

## 2016-08-05 DIAGNOSIS — M545 Low back pain: Secondary | ICD-10-CM | POA: Diagnosis not present

## 2016-08-07 DIAGNOSIS — E11620 Type 2 diabetes mellitus with diabetic dermatitis: Secondary | ICD-10-CM | POA: Diagnosis not present

## 2016-08-07 DIAGNOSIS — E785 Hyperlipidemia, unspecified: Secondary | ICD-10-CM | POA: Diagnosis not present

## 2016-08-07 DIAGNOSIS — I10 Essential (primary) hypertension: Secondary | ICD-10-CM | POA: Diagnosis not present

## 2016-08-07 DIAGNOSIS — M6281 Muscle weakness (generalized): Secondary | ICD-10-CM | POA: Diagnosis not present

## 2016-08-07 DIAGNOSIS — M545 Low back pain: Secondary | ICD-10-CM | POA: Diagnosis not present

## 2016-08-10 DIAGNOSIS — I10 Essential (primary) hypertension: Secondary | ICD-10-CM | POA: Diagnosis not present

## 2016-08-10 DIAGNOSIS — E785 Hyperlipidemia, unspecified: Secondary | ICD-10-CM | POA: Diagnosis not present

## 2016-08-10 DIAGNOSIS — E11620 Type 2 diabetes mellitus with diabetic dermatitis: Secondary | ICD-10-CM | POA: Diagnosis not present

## 2016-08-10 DIAGNOSIS — M545 Low back pain: Secondary | ICD-10-CM | POA: Diagnosis not present

## 2016-08-10 DIAGNOSIS — M6281 Muscle weakness (generalized): Secondary | ICD-10-CM | POA: Diagnosis not present

## 2016-08-12 DIAGNOSIS — E11620 Type 2 diabetes mellitus with diabetic dermatitis: Secondary | ICD-10-CM | POA: Diagnosis not present

## 2016-08-12 DIAGNOSIS — M6281 Muscle weakness (generalized): Secondary | ICD-10-CM | POA: Diagnosis not present

## 2016-08-12 DIAGNOSIS — M545 Low back pain: Secondary | ICD-10-CM | POA: Diagnosis not present

## 2016-08-12 DIAGNOSIS — E785 Hyperlipidemia, unspecified: Secondary | ICD-10-CM | POA: Diagnosis not present

## 2016-08-12 DIAGNOSIS — I10 Essential (primary) hypertension: Secondary | ICD-10-CM | POA: Diagnosis not present

## 2016-08-14 DIAGNOSIS — E11620 Type 2 diabetes mellitus with diabetic dermatitis: Secondary | ICD-10-CM | POA: Diagnosis not present

## 2016-08-14 DIAGNOSIS — E785 Hyperlipidemia, unspecified: Secondary | ICD-10-CM | POA: Diagnosis not present

## 2016-08-14 DIAGNOSIS — M6281 Muscle weakness (generalized): Secondary | ICD-10-CM | POA: Diagnosis not present

## 2016-08-14 DIAGNOSIS — M545 Low back pain: Secondary | ICD-10-CM | POA: Diagnosis not present

## 2016-08-14 DIAGNOSIS — I10 Essential (primary) hypertension: Secondary | ICD-10-CM | POA: Diagnosis not present

## 2016-08-31 DIAGNOSIS — R296 Repeated falls: Secondary | ICD-10-CM | POA: Diagnosis not present

## 2016-08-31 DIAGNOSIS — R2681 Unsteadiness on feet: Secondary | ICD-10-CM | POA: Diagnosis not present

## 2016-09-23 DIAGNOSIS — E11618 Type 2 diabetes mellitus with other diabetic arthropathy: Secondary | ICD-10-CM | POA: Diagnosis not present

## 2016-09-23 DIAGNOSIS — K5901 Slow transit constipation: Secondary | ICD-10-CM | POA: Diagnosis not present

## 2016-09-23 DIAGNOSIS — I10 Essential (primary) hypertension: Secondary | ICD-10-CM | POA: Diagnosis not present

## 2016-10-11 DIAGNOSIS — L84 Corns and callosities: Secondary | ICD-10-CM | POA: Diagnosis not present

## 2016-10-11 DIAGNOSIS — B351 Tinea unguium: Secondary | ICD-10-CM | POA: Diagnosis not present

## 2016-10-11 DIAGNOSIS — E11618 Type 2 diabetes mellitus with other diabetic arthropathy: Secondary | ICD-10-CM | POA: Diagnosis not present

## 2016-10-11 DIAGNOSIS — M2042 Other hammer toe(s) (acquired), left foot: Secondary | ICD-10-CM | POA: Diagnosis not present

## 2016-10-21 DIAGNOSIS — R269 Unspecified abnormalities of gait and mobility: Secondary | ICD-10-CM | POA: Diagnosis not present

## 2016-10-21 DIAGNOSIS — E11620 Type 2 diabetes mellitus with diabetic dermatitis: Secondary | ICD-10-CM | POA: Diagnosis not present

## 2016-10-21 DIAGNOSIS — I10 Essential (primary) hypertension: Secondary | ICD-10-CM | POA: Diagnosis not present

## 2016-10-24 DIAGNOSIS — Z79899 Other long term (current) drug therapy: Secondary | ICD-10-CM | POA: Diagnosis not present

## 2016-11-19 DIAGNOSIS — Z79899 Other long term (current) drug therapy: Secondary | ICD-10-CM | POA: Diagnosis not present

## 2016-11-22 DIAGNOSIS — I10 Essential (primary) hypertension: Secondary | ICD-10-CM | POA: Diagnosis not present

## 2016-11-22 DIAGNOSIS — E11620 Type 2 diabetes mellitus with diabetic dermatitis: Secondary | ICD-10-CM | POA: Diagnosis not present

## 2016-12-11 DIAGNOSIS — I10 Essential (primary) hypertension: Secondary | ICD-10-CM | POA: Diagnosis not present

## 2016-12-11 DIAGNOSIS — E11620 Type 2 diabetes mellitus with diabetic dermatitis: Secondary | ICD-10-CM | POA: Diagnosis not present

## 2016-12-11 DIAGNOSIS — M545 Low back pain: Secondary | ICD-10-CM | POA: Diagnosis not present

## 2016-12-11 DIAGNOSIS — M6281 Muscle weakness (generalized): Secondary | ICD-10-CM | POA: Diagnosis not present

## 2016-12-11 DIAGNOSIS — E785 Hyperlipidemia, unspecified: Secondary | ICD-10-CM | POA: Diagnosis not present

## 2016-12-12 DIAGNOSIS — M545 Low back pain: Secondary | ICD-10-CM | POA: Diagnosis not present

## 2016-12-12 DIAGNOSIS — E11620 Type 2 diabetes mellitus with diabetic dermatitis: Secondary | ICD-10-CM | POA: Diagnosis not present

## 2016-12-12 DIAGNOSIS — E785 Hyperlipidemia, unspecified: Secondary | ICD-10-CM | POA: Diagnosis not present

## 2016-12-12 DIAGNOSIS — M6281 Muscle weakness (generalized): Secondary | ICD-10-CM | POA: Diagnosis not present

## 2016-12-12 DIAGNOSIS — I10 Essential (primary) hypertension: Secondary | ICD-10-CM | POA: Diagnosis not present

## 2016-12-13 DIAGNOSIS — E785 Hyperlipidemia, unspecified: Secondary | ICD-10-CM | POA: Diagnosis not present

## 2016-12-13 DIAGNOSIS — I10 Essential (primary) hypertension: Secondary | ICD-10-CM | POA: Diagnosis not present

## 2016-12-13 DIAGNOSIS — M545 Low back pain: Secondary | ICD-10-CM | POA: Diagnosis not present

## 2016-12-13 DIAGNOSIS — E11620 Type 2 diabetes mellitus with diabetic dermatitis: Secondary | ICD-10-CM | POA: Diagnosis not present

## 2016-12-13 DIAGNOSIS — M6281 Muscle weakness (generalized): Secondary | ICD-10-CM | POA: Diagnosis not present

## 2016-12-14 DIAGNOSIS — M6281 Muscle weakness (generalized): Secondary | ICD-10-CM | POA: Diagnosis not present

## 2016-12-14 DIAGNOSIS — E11620 Type 2 diabetes mellitus with diabetic dermatitis: Secondary | ICD-10-CM | POA: Diagnosis not present

## 2016-12-14 DIAGNOSIS — E785 Hyperlipidemia, unspecified: Secondary | ICD-10-CM | POA: Diagnosis not present

## 2016-12-14 DIAGNOSIS — I10 Essential (primary) hypertension: Secondary | ICD-10-CM | POA: Diagnosis not present

## 2016-12-14 DIAGNOSIS — M545 Low back pain: Secondary | ICD-10-CM | POA: Diagnosis not present

## 2016-12-17 DIAGNOSIS — Z79899 Other long term (current) drug therapy: Secondary | ICD-10-CM | POA: Diagnosis not present

## 2016-12-17 DIAGNOSIS — D518 Other vitamin B12 deficiency anemias: Secondary | ICD-10-CM | POA: Diagnosis not present

## 2016-12-17 DIAGNOSIS — E782 Mixed hyperlipidemia: Secondary | ICD-10-CM | POA: Diagnosis not present

## 2016-12-17 DIAGNOSIS — E119 Type 2 diabetes mellitus without complications: Secondary | ICD-10-CM | POA: Diagnosis not present

## 2016-12-18 DIAGNOSIS — R278 Other lack of coordination: Secondary | ICD-10-CM | POA: Diagnosis not present

## 2016-12-18 DIAGNOSIS — R2689 Other abnormalities of gait and mobility: Secondary | ICD-10-CM | POA: Diagnosis not present

## 2016-12-18 DIAGNOSIS — I10 Essential (primary) hypertension: Secondary | ICD-10-CM | POA: Diagnosis not present

## 2016-12-18 DIAGNOSIS — E785 Hyperlipidemia, unspecified: Secondary | ICD-10-CM | POA: Diagnosis not present

## 2016-12-18 DIAGNOSIS — M545 Low back pain: Secondary | ICD-10-CM | POA: Diagnosis not present

## 2016-12-18 DIAGNOSIS — E11620 Type 2 diabetes mellitus with diabetic dermatitis: Secondary | ICD-10-CM | POA: Diagnosis not present

## 2016-12-18 DIAGNOSIS — M6281 Muscle weakness (generalized): Secondary | ICD-10-CM | POA: Diagnosis not present

## 2016-12-19 DIAGNOSIS — R278 Other lack of coordination: Secondary | ICD-10-CM | POA: Diagnosis not present

## 2016-12-19 DIAGNOSIS — I10 Essential (primary) hypertension: Secondary | ICD-10-CM | POA: Diagnosis not present

## 2016-12-19 DIAGNOSIS — E785 Hyperlipidemia, unspecified: Secondary | ICD-10-CM | POA: Diagnosis not present

## 2016-12-19 DIAGNOSIS — M545 Low back pain: Secondary | ICD-10-CM | POA: Diagnosis not present

## 2016-12-19 DIAGNOSIS — E11620 Type 2 diabetes mellitus with diabetic dermatitis: Secondary | ICD-10-CM | POA: Diagnosis not present

## 2016-12-19 DIAGNOSIS — R2689 Other abnormalities of gait and mobility: Secondary | ICD-10-CM | POA: Diagnosis not present

## 2016-12-19 DIAGNOSIS — M6281 Muscle weakness (generalized): Secondary | ICD-10-CM | POA: Diagnosis not present

## 2016-12-20 DIAGNOSIS — R2689 Other abnormalities of gait and mobility: Secondary | ICD-10-CM | POA: Diagnosis not present

## 2016-12-20 DIAGNOSIS — M6281 Muscle weakness (generalized): Secondary | ICD-10-CM | POA: Diagnosis not present

## 2016-12-20 DIAGNOSIS — M545 Low back pain: Secondary | ICD-10-CM | POA: Diagnosis not present

## 2016-12-20 DIAGNOSIS — I10 Essential (primary) hypertension: Secondary | ICD-10-CM | POA: Diagnosis not present

## 2016-12-20 DIAGNOSIS — R278 Other lack of coordination: Secondary | ICD-10-CM | POA: Diagnosis not present

## 2016-12-20 DIAGNOSIS — E785 Hyperlipidemia, unspecified: Secondary | ICD-10-CM | POA: Diagnosis not present

## 2016-12-20 DIAGNOSIS — E11620 Type 2 diabetes mellitus with diabetic dermatitis: Secondary | ICD-10-CM | POA: Diagnosis not present

## 2016-12-23 DIAGNOSIS — R2689 Other abnormalities of gait and mobility: Secondary | ICD-10-CM | POA: Diagnosis not present

## 2016-12-23 DIAGNOSIS — R278 Other lack of coordination: Secondary | ICD-10-CM | POA: Diagnosis not present

## 2016-12-23 DIAGNOSIS — E11620 Type 2 diabetes mellitus with diabetic dermatitis: Secondary | ICD-10-CM | POA: Diagnosis not present

## 2016-12-24 DIAGNOSIS — R2689 Other abnormalities of gait and mobility: Secondary | ICD-10-CM | POA: Diagnosis not present

## 2016-12-24 DIAGNOSIS — R278 Other lack of coordination: Secondary | ICD-10-CM | POA: Diagnosis not present

## 2016-12-24 DIAGNOSIS — E11620 Type 2 diabetes mellitus with diabetic dermatitis: Secondary | ICD-10-CM | POA: Diagnosis not present

## 2016-12-25 DIAGNOSIS — R2689 Other abnormalities of gait and mobility: Secondary | ICD-10-CM | POA: Diagnosis not present

## 2016-12-25 DIAGNOSIS — R278 Other lack of coordination: Secondary | ICD-10-CM | POA: Diagnosis not present

## 2016-12-25 DIAGNOSIS — E11620 Type 2 diabetes mellitus with diabetic dermatitis: Secondary | ICD-10-CM | POA: Diagnosis not present

## 2016-12-26 DIAGNOSIS — R278 Other lack of coordination: Secondary | ICD-10-CM | POA: Diagnosis not present

## 2016-12-26 DIAGNOSIS — E11620 Type 2 diabetes mellitus with diabetic dermatitis: Secondary | ICD-10-CM | POA: Diagnosis not present

## 2016-12-26 DIAGNOSIS — R2689 Other abnormalities of gait and mobility: Secondary | ICD-10-CM | POA: Diagnosis not present

## 2016-12-27 DIAGNOSIS — R2689 Other abnormalities of gait and mobility: Secondary | ICD-10-CM | POA: Diagnosis not present

## 2016-12-27 DIAGNOSIS — R278 Other lack of coordination: Secondary | ICD-10-CM | POA: Diagnosis not present

## 2016-12-27 DIAGNOSIS — E11620 Type 2 diabetes mellitus with diabetic dermatitis: Secondary | ICD-10-CM | POA: Diagnosis not present

## 2016-12-30 DIAGNOSIS — R278 Other lack of coordination: Secondary | ICD-10-CM | POA: Diagnosis not present

## 2016-12-30 DIAGNOSIS — R2689 Other abnormalities of gait and mobility: Secondary | ICD-10-CM | POA: Diagnosis not present

## 2016-12-30 DIAGNOSIS — E11620 Type 2 diabetes mellitus with diabetic dermatitis: Secondary | ICD-10-CM | POA: Diagnosis not present

## 2016-12-31 DIAGNOSIS — S301XXA Contusion of abdominal wall, initial encounter: Secondary | ICD-10-CM | POA: Diagnosis not present

## 2016-12-31 DIAGNOSIS — M7989 Other specified soft tissue disorders: Secondary | ICD-10-CM | POA: Diagnosis not present

## 2016-12-31 DIAGNOSIS — S299XXA Unspecified injury of thorax, initial encounter: Secondary | ICD-10-CM | POA: Diagnosis not present

## 2016-12-31 DIAGNOSIS — E119 Type 2 diabetes mellitus without complications: Secondary | ICD-10-CM | POA: Diagnosis not present

## 2016-12-31 DIAGNOSIS — W1839XA Other fall on same level, initial encounter: Secondary | ICD-10-CM | POA: Diagnosis not present

## 2016-12-31 DIAGNOSIS — R079 Chest pain, unspecified: Secondary | ICD-10-CM | POA: Diagnosis not present

## 2016-12-31 DIAGNOSIS — R1011 Right upper quadrant pain: Secondary | ICD-10-CM | POA: Diagnosis not present

## 2016-12-31 DIAGNOSIS — S20211A Contusion of right front wall of thorax, initial encounter: Secondary | ICD-10-CM | POA: Diagnosis not present

## 2016-12-31 DIAGNOSIS — R001 Bradycardia, unspecified: Secondary | ICD-10-CM | POA: Diagnosis not present

## 2016-12-31 DIAGNOSIS — R109 Unspecified abdominal pain: Secondary | ICD-10-CM | POA: Diagnosis not present

## 2016-12-31 DIAGNOSIS — N390 Urinary tract infection, site not specified: Secondary | ICD-10-CM | POA: Diagnosis not present

## 2016-12-31 DIAGNOSIS — I10 Essential (primary) hypertension: Secondary | ICD-10-CM | POA: Diagnosis not present

## 2017-01-01 DIAGNOSIS — R1011 Right upper quadrant pain: Secondary | ICD-10-CM | POA: Diagnosis not present

## 2017-01-01 DIAGNOSIS — S301XXA Contusion of abdominal wall, initial encounter: Secondary | ICD-10-CM | POA: Diagnosis not present

## 2017-01-01 DIAGNOSIS — N390 Urinary tract infection, site not specified: Secondary | ICD-10-CM | POA: Diagnosis not present

## 2017-01-01 DIAGNOSIS — D62 Acute posthemorrhagic anemia: Secondary | ICD-10-CM | POA: Diagnosis not present

## 2017-01-01 DIAGNOSIS — Z79899 Other long term (current) drug therapy: Secondary | ICD-10-CM | POA: Diagnosis not present

## 2017-01-01 DIAGNOSIS — E119 Type 2 diabetes mellitus without complications: Secondary | ICD-10-CM | POA: Diagnosis not present

## 2017-01-01 DIAGNOSIS — Z794 Long term (current) use of insulin: Secondary | ICD-10-CM | POA: Diagnosis not present

## 2017-01-01 DIAGNOSIS — M7989 Other specified soft tissue disorders: Secondary | ICD-10-CM | POA: Diagnosis not present

## 2017-01-01 DIAGNOSIS — S20211A Contusion of right front wall of thorax, initial encounter: Secondary | ICD-10-CM | POA: Diagnosis not present

## 2017-01-01 DIAGNOSIS — Z7982 Long term (current) use of aspirin: Secondary | ICD-10-CM | POA: Diagnosis not present

## 2017-01-01 DIAGNOSIS — R079 Chest pain, unspecified: Secondary | ICD-10-CM | POA: Diagnosis not present

## 2017-01-01 DIAGNOSIS — E785 Hyperlipidemia, unspecified: Secondary | ICD-10-CM | POA: Diagnosis not present

## 2017-01-01 DIAGNOSIS — R109 Unspecified abdominal pain: Secondary | ICD-10-CM | POA: Diagnosis not present

## 2017-01-01 DIAGNOSIS — S299XXA Unspecified injury of thorax, initial encounter: Secondary | ICD-10-CM | POA: Diagnosis not present

## 2017-01-01 DIAGNOSIS — W1839XA Other fall on same level, initial encounter: Secondary | ICD-10-CM | POA: Diagnosis not present

## 2017-01-01 DIAGNOSIS — I10 Essential (primary) hypertension: Secondary | ICD-10-CM | POA: Diagnosis not present

## 2017-01-01 DIAGNOSIS — Z7902 Long term (current) use of antithrombotics/antiplatelets: Secondary | ICD-10-CM | POA: Diagnosis not present

## 2017-01-03 DIAGNOSIS — L84 Corns and callosities: Secondary | ICD-10-CM | POA: Diagnosis not present

## 2017-01-03 DIAGNOSIS — B351 Tinea unguium: Secondary | ICD-10-CM | POA: Diagnosis not present

## 2017-01-03 DIAGNOSIS — E11620 Type 2 diabetes mellitus with diabetic dermatitis: Secondary | ICD-10-CM | POA: Diagnosis not present

## 2017-01-03 DIAGNOSIS — D6489 Other specified anemias: Secondary | ICD-10-CM | POA: Diagnosis not present

## 2017-01-06 DIAGNOSIS — R278 Other lack of coordination: Secondary | ICD-10-CM | POA: Diagnosis not present

## 2017-01-06 DIAGNOSIS — E11620 Type 2 diabetes mellitus with diabetic dermatitis: Secondary | ICD-10-CM | POA: Diagnosis not present

## 2017-01-06 DIAGNOSIS — R2689 Other abnormalities of gait and mobility: Secondary | ICD-10-CM | POA: Diagnosis not present

## 2017-01-07 DIAGNOSIS — R2689 Other abnormalities of gait and mobility: Secondary | ICD-10-CM | POA: Diagnosis not present

## 2017-01-07 DIAGNOSIS — R269 Unspecified abnormalities of gait and mobility: Secondary | ICD-10-CM | POA: Diagnosis not present

## 2017-01-07 DIAGNOSIS — R278 Other lack of coordination: Secondary | ICD-10-CM | POA: Diagnosis not present

## 2017-01-07 DIAGNOSIS — E11620 Type 2 diabetes mellitus with diabetic dermatitis: Secondary | ICD-10-CM | POA: Diagnosis not present

## 2017-01-08 DIAGNOSIS — R278 Other lack of coordination: Secondary | ICD-10-CM | POA: Diagnosis not present

## 2017-01-08 DIAGNOSIS — E11620 Type 2 diabetes mellitus with diabetic dermatitis: Secondary | ICD-10-CM | POA: Diagnosis not present

## 2017-01-08 DIAGNOSIS — R2689 Other abnormalities of gait and mobility: Secondary | ICD-10-CM | POA: Diagnosis not present

## 2017-01-09 DIAGNOSIS — R278 Other lack of coordination: Secondary | ICD-10-CM | POA: Diagnosis not present

## 2017-01-09 DIAGNOSIS — Z79899 Other long term (current) drug therapy: Secondary | ICD-10-CM | POA: Diagnosis not present

## 2017-01-09 DIAGNOSIS — R2689 Other abnormalities of gait and mobility: Secondary | ICD-10-CM | POA: Diagnosis not present

## 2017-01-09 DIAGNOSIS — E11620 Type 2 diabetes mellitus with diabetic dermatitis: Secondary | ICD-10-CM | POA: Diagnosis not present

## 2017-01-10 DIAGNOSIS — R278 Other lack of coordination: Secondary | ICD-10-CM | POA: Diagnosis not present

## 2017-01-10 DIAGNOSIS — R2689 Other abnormalities of gait and mobility: Secondary | ICD-10-CM | POA: Diagnosis not present

## 2017-01-10 DIAGNOSIS — E11620 Type 2 diabetes mellitus with diabetic dermatitis: Secondary | ICD-10-CM | POA: Diagnosis not present

## 2017-01-12 DIAGNOSIS — R2689 Other abnormalities of gait and mobility: Secondary | ICD-10-CM | POA: Diagnosis not present

## 2017-01-12 DIAGNOSIS — R278 Other lack of coordination: Secondary | ICD-10-CM | POA: Diagnosis not present

## 2017-01-12 DIAGNOSIS — E11620 Type 2 diabetes mellitus with diabetic dermatitis: Secondary | ICD-10-CM | POA: Diagnosis not present

## 2017-01-14 DIAGNOSIS — R278 Other lack of coordination: Secondary | ICD-10-CM | POA: Diagnosis not present

## 2017-01-14 DIAGNOSIS — R2689 Other abnormalities of gait and mobility: Secondary | ICD-10-CM | POA: Diagnosis not present

## 2017-01-14 DIAGNOSIS — E11620 Type 2 diabetes mellitus with diabetic dermatitis: Secondary | ICD-10-CM | POA: Diagnosis not present

## 2017-03-17 ENCOUNTER — Ambulatory Visit (INDEPENDENT_AMBULATORY_CARE_PROVIDER_SITE_OTHER): Payer: Medicare Other | Admitting: Neurology

## 2017-03-17 ENCOUNTER — Telehealth: Payer: Self-pay | Admitting: Neurology

## 2017-03-17 ENCOUNTER — Encounter: Payer: Self-pay | Admitting: Neurology

## 2017-03-17 VITALS — BP 143/71 | HR 53 | Ht 60.0 in | Wt 180.5 lb

## 2017-03-17 DIAGNOSIS — R269 Unspecified abnormalities of gait and mobility: Secondary | ICD-10-CM | POA: Diagnosis not present

## 2017-03-17 DIAGNOSIS — R413 Other amnesia: Secondary | ICD-10-CM

## 2017-03-17 DIAGNOSIS — R569 Unspecified convulsions: Secondary | ICD-10-CM | POA: Diagnosis not present

## 2017-03-17 NOTE — Telephone Encounter (Signed)
Returned call to [facility] - they are aware of orders and will document in patient's chart.

## 2017-03-17 NOTE — Progress Notes (Signed)
PATIENT: Brooke Gross DOB: August 02, 1940  Chief Complaint  Patient presents with  . Seizures    She resides at [facility] in [place]. She is here with Brooke Gross who helps with transportation at the facility.  Reports having her first seizure at age 76.  She is now taking Keppra , BID and Vimpat , BID.  She has only had two further seizure events since being on medication.  She estimates her last seizure to be 1.5 years ago.  Marland Kitchen PCP    Timmie Foerster, MD     HISTORICAL  Brooke Gross is a 76 year old female, seen in refer by her primary care doctor Norval Morton for evaluation of seizure, initial evaluation was March 17 2017.  I reviewed and summarized the referring note, she had a past medical history of epilepsy, hypertension, hyperlipidemia, anemia, type 2 diabetes,She has been a resident of current facility at [facility] in [place] since 2017  She had her first seizure about 3 years ago in 2015, but she has memory loss, today's MOCA score is 22/30, she is a poor historian, reported most recent seizure was about 2 weeks ago in the middle of September 2018, she had transient confusion, loss of consciousness, she is on polypharmacy treatment including Keppra 1500 mg twice a day, Vimpat 100 mg twice a day, previously tried Dilantin, but no longer on her medication list,   Her son is her power of attorney, 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, lives alone until she moved to facility in 2015,   She enjoys reading, but reported worsening memory loss, she also has past medical history of depression, diabetes, hypertension,  I was able to review her medication list, aspirin 81 mg daily, ferrous sulfate 350 mg daily metoprolol 12.5 mg once a day, Lantus as needed, MiraLAX powder, mirtazapine 30 mg at that time, vitamin D, Pravachol 80 mg at bedtime, Protonix 20 mg in the morning, Effexor ER 150 mg at bedtime, Keppra 1500 mg twice, metformin 500  mg twice, Vimpat 100 mg twice, tramadol 50 mg as needed  REVIEW OF SYSTEMS: Full 14 system review of systems performed and notable only for as above  ALLERGIES: Allergies  Allergen Reactions  . Aspirin Other (See Comments)    Pt reports hives with any dose above .    HOME MEDICATIONS: Current Outpatient Prescriptions  Medication Sig Dispense Refill  . aspirin 81 MG tablet Take 81 mg by mouth daily.    . ferrous sulfate 325 (65 FE) MG tablet Take 325 mg by mouth daily with breakfast.    . insulin glargine (LANTUS) 100 UNIT/ML injection Inject 10 Units into the skin at bedtime.    . Lacosamide (VIMPAT) 100 MG TABS Take 100 mg by mouth 2 (two) times daily.    . LevETIRAcetam (KEPPRA PO) Take 1,500 mg by mouth 2 (two) times daily.    . metFORMIN (GLUCOPHAGE) 500 MG tablet Take by mouth 2 (two) times daily with a meal.    . METOPROLOL TARTRATE PO Take 12.5 mg by mouth daily.    . mirtazapine (REMERON) 30 MG tablet Take 30 mg by mouth at bedtime.    . pantoprazole (PROTONIX) 20 MG tablet Take 20 mg by mouth daily.    . polyethylene glycol (MIRALAX / GLYCOLAX) packet Take 17 g by mouth at bedtime.    . pravastatin (PRAVACHOL) 80 MG tablet Take 80 mg by mouth daily.    Marland Kitchen  traMADol (ULTRAM) 50 MG tablet Take by mouth every 6 (six) hours as needed.    . venlafaxine XR (EFFEXOR-XR) 150 MG 24 hr capsule Take 150 mg by mouth at bedtime.    . Vitamin D, Ergocalciferol, (DRISDOL) 50000 units CAPS capsule Take 50,000 Units by mouth every 7 (seven) days.     No current facility-administered medications for this visit.     PAST MEDICAL HISTORY: Past Medical History:  Diagnosis Date  . Anemia   . Anxiety disorder   . Depression   . Diabetes (HCC)   . Epilepsy (HCC)   . Gait difficulty   . GERD (gastroesophageal reflux disease)   . Hyperlipemia   . Hypertension     PAST SURGICAL HISTORY: Past Surgical History:  Procedure Laterality Date  . CERVICAL SPINE SURGERY    . CHOLECYSTECTOMY       FAMILY HISTORY: Family History  Problem Relation Age of Onset  . Other Mother        unsure of history  . Heart attack Father     SOCIAL HISTORY:  Social History   Social History  . Marital status: Divorced    Spouse name: N/A  . Number of children: 1  . Years of education: 70   Occupational History  . retired    Social History Main Topics  . Smoking status: Former Smoker    Quit date: 2007  . Smokeless tobacco: Current User    Types: Chew  . Alcohol use No  . Drug use: No  . Sexual activity: Not on file   Other Topics Concern  . Not on file   Social History Narrative   Lives at [facility] in [place].   Left-handed.   1 cup coffee per day.     PHYSICAL EXAM   Vitals:   03/17/17 1034  BP: (!) 143/71  Pulse: (!) 53  Weight: 180 lb 8 oz (81.9 kg)  Height: 5' (1.524 m)    Not recorded      Body mass index is 35.25 kg/m.  PHYSICAL EXAMNIATION:  Gen: NAD, conversant, well nourised, obese, well groomed                     Cardiovascular: Regular rate rhythm, no peripheral edema, warm, nontender. Eyes: Conjunctivae clear without exudates or hemorrhage Neck: Supple, no carotid bruits. Pulmonary: Clear to auscultation bilaterally   NEUROLOGICAL EXAM:  MENTAL STATUS: MOCA 22/30 Speech:    Speech is normal; fluent and spontaneous with normal comprehension.  Cognition:     Orientation to time, place and person     She missed 3 out of 5 recalls     Normal Attention span and concentration     Normal Language, naming, repeating,spontaneous speech     Fund of knowledge She has difficulty copy design and draw clock face,   CRANIAL NERVES: CN II: Visual fields are full to confrontation. Fundoscopic exam is normal with sharp discs and no vascular changes. Pupils are round equal and briskly reactive to light. CN III, IV, VI: extraocular movement are normal. No ptosis. CN V: Facial sensation is intact to pinprick in all 3 divisions bilaterally. Corneal  responses are intact.  CN VII: Face is symmetric with normal eye closure and smile. CN VIII: Hearing is normal to rubbing fingers CN IX, X: Palate elevates symmetrically. Phonation is normal. CN XI: Head turning and shoulder shrug are intact CN XII: Tongue is midline with normal movements and no atrophy.  MOTOR:  There is no pronator drift of out-stretched arms. Muscle bulk and tone are normal. Muscle strength is normal.  REFLEXES: Reflexes are 2+ and symmetric at the biceps, triceps, knees, and ankles. Plantar responses are flexor.  SENSORY: Intact to light touch, pinprick, positional sensation and vibratory sensation are intact in fingers and toes.  COORDINATION: Rapid alternating movements and fine finger movements are intact. There is no dysmetria on finger-to-nose and heel-knee-shin.    GAIT/STANCE: She needs push up to get up from seated position, cautious, unsteady   DIAGNOSTIC DATA (LABS, IMAGING, TESTING) - I reviewed patient records, labs, notes, testing and imaging myself where available.   ASSESSMENT AND PLAN  MENUCHA DICESARE is a 76 y.o. female   mild cognitive impairment Seizure Gait abnormality Polypharmacy treatment  We will proceed with MRI of the brain  Laboratory evaluations  EEG  Keep current seizure medicines Keppra 1500 mg twice a day, Vimpat 100 mg twice a day  Refer her to physical therapy at her facility   Levert Feinstein, M.D. Ph.D.  [facility] [address] [place], [place] [address] Ph: [phone] Fax: [phone]  CC: Norval Morton, MD

## 2017-03-17 NOTE — Telephone Encounter (Signed)
Gina with [facility] is calling to find out what labs they are to draw.  She also needs to know if MRI is to be with and without contrast.Patient was seen by Dr. Terrace Arabia today.

## 2017-03-18 ENCOUNTER — Telehealth: Payer: Self-pay | Admitting: Neurology

## 2017-03-18 LAB — CBC WITH DIFFERENTIAL/PLATELET
Basophils Absolute: 0 10*3/uL (ref 0.0–0.2)
Basos: 0 %
EOS (ABSOLUTE): 0.4 10*3/uL (ref 0.0–0.4)
Eos: 5 %
Hematocrit: 38.8 % (ref 34.0–46.6)
Hemoglobin: 12.7 g/dL (ref 11.1–15.9)
Immature Grans (Abs): 0 10*3/uL (ref 0.0–0.1)
Immature Granulocytes: 0 %
Lymphocytes Absolute: 1.3 10*3/uL (ref 0.7–3.1)
Lymphs: 16 %
MCH: 30.1 pg (ref 26.6–33.0)
MCHC: 32.7 g/dL (ref 31.5–35.7)
MCV: 92 fL (ref 79–97)
Monocytes Absolute: 0.6 10*3/uL (ref 0.1–0.9)
Monocytes: 8 %
Neutrophils Absolute: 6 10*3/uL (ref 1.4–7.0)
Neutrophils: 71 %
Platelets: 245 10*3/uL (ref 150–379)
RBC: 4.22 x10E6/uL (ref 3.77–5.28)
RDW: 14.2 % (ref 12.3–15.4)
WBC: 8.4 10*3/uL (ref 3.4–10.8)

## 2017-03-18 LAB — COMPREHENSIVE METABOLIC PANEL
ALT: 9 IU/L (ref 0–32)
AST: 19 IU/L (ref 0–40)
Albumin/Globulin Ratio: 1.7 (ref 1.2–2.2)
Albumin: 4.5 g/dL (ref 3.5–4.8)
Alkaline Phosphatase: 127 IU/L — ABNORMAL HIGH (ref 39–117)
BUN/Creatinine Ratio: 24 (ref 12–28)
BUN: 24 mg/dL (ref 8–27)
Bilirubin Total: <0.2 mg/dL (ref 0.0–1.2)
CO2: 21 mmol/L (ref 20–29)
Calcium: 9.8 mg/dL (ref 8.7–10.3)
Chloride: 102 mmol/L (ref 96–106)
Creatinine, Ser: 0.99 mg/dL (ref 0.57–1.00)
GFR calc Af Amer: 65 mL/min/{1.73_m2} (ref >59)
GFR calc non Af Amer: 56 mL/min/{1.73_m2} — ABNORMAL LOW (ref >59)
Globulin, Total: 2.6 g/dL (ref 1.5–4.5)
Glucose: 89 mg/dL (ref 65–99)
Potassium: 5.1 mmol/L (ref 3.5–5.2)
Sodium: 144 mmol/L (ref 134–144)
Total Protein: 7.1 g/dL (ref 6.0–8.5)

## 2017-03-18 LAB — CK: Total CK: 59 U/L (ref 24–173)

## 2017-03-18 LAB — VITAMIN B12: Vitamin B-12: 424 pg/mL (ref 232–1245)

## 2017-03-18 LAB — FOLATE: Folate: 3.7 ng/mL (ref >3.0)

## 2017-03-18 LAB — RPR: RPR Ser Ql: NONREACTIVE

## 2017-03-18 LAB — C-REACTIVE PROTEIN: CRP: 15.1 mg/L — ABNORMAL HIGH (ref 0.0–4.9)

## 2017-03-18 LAB — TSH: TSH: 2.92 u[IU]/mL (ref 0.450–4.500)

## 2017-03-18 LAB — SEDIMENTATION RATE: Sed Rate: 11 mm/hr (ref 0–40)

## 2017-03-18 NOTE — Telephone Encounter (Signed)
Please call her facility and also fax the lab result to her facility,  Extensive laboratory evaluation showed mild elevated C reactive protein with normal ESR, above findings has no clinical significance,  Rest of the laboratory evaluation showed no significant abnormality

## 2017-03-18 NOTE — Telephone Encounter (Signed)
[facility] (ph [phone]) - spoke to Ida, who assist with the patient's care.  She is aware of lab results and verbalized understanding.  Results have also been faxed for their records (fax [phone]).

## 2017-03-31 ENCOUNTER — Ambulatory Visit (INDEPENDENT_AMBULATORY_CARE_PROVIDER_SITE_OTHER): Payer: Medicare Other | Admitting: Neurology

## 2017-03-31 DIAGNOSIS — R569 Unspecified convulsions: Secondary | ICD-10-CM

## 2017-03-31 DIAGNOSIS — R413 Other amnesia: Secondary | ICD-10-CM

## 2017-03-31 DIAGNOSIS — R269 Unspecified abnormalities of gait and mobility: Secondary | ICD-10-CM

## 2017-04-02 ENCOUNTER — Ambulatory Visit
Admission: RE | Admit: 2017-04-02 | Discharge: 2017-04-02 | Disposition: A | Discharge status: Home / Self Care | Payer: Medicare Other | Source: Ambulatory Visit | Attending: Neurology | Admitting: Neurology

## 2017-04-02 DIAGNOSIS — R413 Other amnesia: Secondary | ICD-10-CM | POA: Diagnosis not present

## 2017-04-02 NOTE — Procedures (Signed)
   HISTORY: 76 year old female, presented with seizure TECHNIQUE:  16 channel EEG was performed based on standard 10-16 international system. One channel was dedicated to EKG, which has demonstrates normal sinus rhythm of 84 beats per minutes.  Upon awakening, the posterior background activity was mildly dysrhythmic, in the theta range, 6-7 Hz, reactive to eye opening and closure.  There was no evidence of epileptiform discharge.  Photic stimulation was performed, which induced a symmetric photic driving.  Hyperventilation was performed, there was no abnormality elicit.  No sleep was achieved.  CONCLUSION: This is a mild abnormal awake EEG.  There is evidence of mild generalized slowing, consistent with mild bi-hemisphere malfunction, common etiology are metabolic toxic, there is no evidence of epileptiform discharges.  Levert FeinsteinYijun Yan, M.D. Ph.D.  [facility] [address] [place], [place] [address] Phone: [phone] Fax:      [phone]

## 2017-04-03 ENCOUNTER — Telehealth: Payer: Self-pay | Admitting: Neurology

## 2017-04-03 ENCOUNTER — Telehealth: Payer: Self-pay | Admitting: *Deleted

## 2017-04-03 NOTE — Telephone Encounter (Signed)
-----   Message from Levert FeinsteinYijun Yan, MD sent at 04/03/2017  8:21 AM EDT ----- Please call patient: slight slowing of background activities on EEG, no epileptiform discharge.

## 2017-04-03 NOTE — Telephone Encounter (Signed)
[facility] [place] at [facility] - she requested patient's EEG results be faxed to [phone].  She will provide them to the resident MD and PA who resides over her care.  Provided our number to call with any questions.  Results faxed.

## 2017-04-03 NOTE — Telephone Encounter (Signed)
Spoke to [place] at the [facility] - she requested patient's MRI results be faxed to [phone].  She will provide them to the resident MD and PA who resides over her care.  Provided our number to call with any questions.  Results faxed.

## 2017-04-03 NOTE — Telephone Encounter (Signed)
Please call patient, MRI of the brain showed age related changes, atrophy, I will review films with her at her next follow-up visit  IMPRESSION:  Abnormal MRI brain (without) demonstrating: 1. Moderate anterior temporal, mild right and moderate-severe left perisylvian atrophy. 2. Mild chronic small vessel ischemic disease. 3. Single punctate left parietal chronic cerebral microhemorrhage. 4. No acute findings. 5. Compared to CT on 08/02/14 there has been progression of atrophy.

## 2017-07-01 ENCOUNTER — Encounter: Payer: Self-pay | Admitting: Neurology

## 2017-07-01 ENCOUNTER — Ambulatory Visit (INDEPENDENT_AMBULATORY_CARE_PROVIDER_SITE_OTHER): Payer: Medicare Other | Admitting: Neurology

## 2017-07-01 VITALS — BP 125/58 | HR 50 | Ht 63.0 in | Wt 191.0 lb

## 2017-07-01 DIAGNOSIS — R269 Unspecified abnormalities of gait and mobility: Secondary | ICD-10-CM

## 2017-07-01 DIAGNOSIS — R569 Unspecified convulsions: Secondary | ICD-10-CM

## 2017-07-01 NOTE — Progress Notes (Signed)
PATIENT: Brooke Gross DOB: Sep 25, 1940  Chief Complaint  Patient presents with  . Seizures    She resides at [facility] in [place].  She has continued both Keppra and Vimpat.  No seizure activity reported.  She would like to review her MRI and EEG results.  . Gait Abnormality    She went to PT three times weekly for one month but did not feel it provided her with much benefit.  She is using a rolling walker to assist with ambulation.  She reports a couple of falls.     HISTORICAL  Brooke Gross is a 77 year old female, seen in refer by her primary care doctor Harlene Salts for evaluation of seizure, initial evaluation was March 17 2017.  I reviewed and summarized the referring note, she had a past medical history of epilepsy, hypertension, hyperlipidemia, anemia, type 2 diabetes,She has been a resident of current facility at [facility] in [place] since 2017  She had her first seizure about 3 years ago in 2015, but she has memory loss, today's MOCA score is 22/30, she is a poor historian, reported most recent seizure was about 2 weeks ago in the middle of September 2018, she had transient confusion, loss of consciousness, she is on polypharmacy treatment including Keppra 1500 mg twice a day, Vimpat 100 mg twice a day, previously tried Dilantin, but no longer on her medication list,   Her son is her power of attorney, 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, lives alone until she moved to facility in 2015,   She enjoys reading, but reported worsening memory loss, she also has past medical history of depression, diabetes, hypertension,  I was able to review her medication list, aspirin 81 mg daily, ferrous sulfate 350 mg daily metoprolol 12.5 mg once a day, Lantus as needed, MiraLAX powder, mirtazapine 30 mg at that time, vitamin D, Pravachol 80 mg at bedtime, Protonix 20 mg in the morning, Effexor ER 150 mg at bedtime, Keppra 1500 mg twice, metformin  500 mg twice, Vimpat 100 mg twice, tramadol 50 mg as needed  UPDATE Jul 01 2017: EEG on March 31, 2017 showed mild generalized slowing  MRI of the brain in October 2018 moderate anterior temporal, and bilateral perisylvian atrophy, mild supratentorium small vessel disease.  Laboratory evaluation October 2018, normal CMP, CBC, ESR, CPK, TSH, folic acid, RPR, O24  Mild elevated C-reactive protein 15,  Continue complains of mild memory loss, no recurrent seizure  REVIEW OF SYSTEMS: Full 14 system review of systems performed and notable only for as above  ALLERGIES: Allergies  Allergen Reactions  . Aspirin Other (See Comments)    Pt reports hives with any dose above 63m.    HOME MEDICATIONS: Current Outpatient Medications  Medication Sig Dispense Refill  . aspirin 81 MG tablet Take 81 mg by mouth daily.    . ferrous sulfate 325 (65 FE) MG tablet Take 325 mg by mouth daily with breakfast.    . insulin glargine (LANTUS) 100 UNIT/ML injection Inject 10 Units into the skin at bedtime.    . Lacosamide (VIMPAT) 100 MG TABS Take 100 mg by mouth 2 (two) times daily.    . LevETIRAcetam (KEPPRA PO) Take 1,500 mg by mouth 2 (two) times daily.    . metFORMIN (GLUCOPHAGE) 500 MG tablet Take by mouth 2 (two) times daily with a meal.    . METOPROLOL TARTRATE PO Take 12.5 mg by  mouth daily.    . mirtazapine (REMERON) 30 MG tablet Take 30 mg by mouth at bedtime.    . pantoprazole (PROTONIX) 20 MG tablet Take 20 mg by mouth daily.    . polyethylene glycol (MIRALAX / GLYCOLAX) packet Take 17 g by mouth at bedtime.    . pravastatin (PRAVACHOL) 80 MG tablet Take 80 mg by mouth daily.    . traMADol (ULTRAM) 50 MG tablet Take by mouth every 6 (six) hours as needed.    . venlafaxine XR (EFFEXOR-XR) 150 MG 24 hr capsule Take 150 mg by mouth at bedtime.    . Vitamin D, Ergocalciferol, (DRISDOL) 50000 units CAPS capsule Take 50,000 Units by mouth every 7 (seven) days.     No current facility-administered  medications for this visit.     PAST MEDICAL HISTORY: Past Medical History:  Diagnosis Date  . Anemia   . Anxiety disorder   . Depression   . Diabetes ([place])   . Epilepsy ([place])   . Gait difficulty   . GERD (gastroesophageal reflux disease)   . Hyperlipemia   . Hypertension     PAST SURGICAL HISTORY: Past Surgical History:  Procedure Laterality Date  . CERVICAL SPINE SURGERY    . CHOLECYSTECTOMY      FAMILY HISTORY: Family History  Problem Relation Age of Onset  . Other Mother        unsure of history  . Heart attack Father     SOCIAL HISTORY:  Social History   Socioeconomic History  . Marital status: Divorced    Spouse name: Not on file  . Number of children: 1  . Years of education: 63  . Highest education level: Not on file  Social Needs  . Financial resource strain: Not on file  . Food insecurity - worry: Not on file  . Food insecurity - inability: Not on file  . Transportation needs - medical: Not on file  . Transportation needs - non-medical: Not on file  Occupational History  . Occupation: retired  Tobacco Use  . Smoking status: Former Smoker    Last attempt to quit: 2007    Years since quitting: 12.0  . Smokeless tobacco: Current User    Types: Chew  Substance and Sexual Activity  . Alcohol use: No  . Drug use: No  . Sexual activity: Not on file  Other Topics Concern  . Not on file  Social History Narrative   Lives at [facility] in [place].   Left-handed.   1 cup coffee per day.     PHYSICAL EXAM   Vitals:   07/01/17 1214  BP: (!) 125/58  Pulse: (!) 50  Weight: 191 lb (86.6 kg)  Height: _0  (1.6 m)    Not recorded      Body mass index is 33.83 kg/m.  PHYSICAL EXAMNIATION:  Gen: NAD, conversant, well nourised, obese, well groomed                     Cardiovascular: Regular rate rhythm, no peripheral edema, warm, nontender. Eyes: Conjunctivae clear without exudates or hemorrhage Neck: Supple, no carotid bruits. Pulmonary:  Clear to auscultation bilaterally   NEUROLOGICAL EXAM:  MENTAL STATUS: MOCA 22/30 Speech:    Speech is normal; fluent and spontaneous with normal comprehension.  Cognition:     Orientation to time, place and person     She missed 3 out of 5 recalls     Normal Attention span and concentration  Normal Language, naming, repeating,spontaneous speech     Fund of knowledge She has difficulty copy design and draw clock face,   CRANIAL NERVES: CN II: Visual fields are full to confrontation. Fundoscopic exam is normal with sharp discs and no vascular changes. Pupils are round equal and briskly reactive to light. CN III, IV, VI: extraocular movement are normal. No ptosis. CN V: Facial sensation is intact to pinprick in all 3 divisions bilaterally. Corneal responses are intact.  CN VII: Face is symmetric with normal eye closure and smile. CN VIII: Hearing is normal to rubbing fingers CN IX, X: Palate elevates symmetrically. Phonation is normal. CN XI: Head turning and shoulder shrug are intact CN XII: Tongue is midline with normal movements and no atrophy.  MOTOR: There is no pronator drift of out-stretched arms. Muscle bulk and tone are normal. Muscle strength is normal.  REFLEXES: Reflexes are 2+ and symmetric at the biceps, triceps, knees, and ankles. Plantar responses are flexor.  SENSORY: Intact to light touch, pinprick, positional sensation and vibratory sensation are intact in fingers and toes.  COORDINATION: Rapid alternating movements and fine finger movements are intact. There is no dysmetria on finger-to-nose and heel-knee-shin.    GAIT/STANCE: She needs push up to get up from seated position, cautious, unsteady   DIAGNOSTIC DATA (LABS, IMAGING, TESTING) - I reviewed patient records, labs, notes, testing and imaging myself where available.   ASSESSMENT AND PLAN  Brooke Gross is a 77 y.o. female   mild cognitive impairment Seizure Gait abnormality Polypharmacy  treatment  Keep Keppra 1572m bid,   Stop Vimpat    YMarcial Pacas M.D. Ph.D.  [facility] [address] [place] [address]Ph: [phone]: [phone] CC: PHarlene Salts MD

## 2017-07-25 ENCOUNTER — Telehealth: Payer: Self-pay | Admitting: Neurology

## 2017-07-25 NOTE — Telephone Encounter (Signed)
I [facility] in February 2019, hemoglobin of 11.4, creatinine of 0.6, normal liver functional test, LDL was elevated, A1c was elevated, normal TSH, she has  CT angiogram July 16, 2017, mild atherosclerotic changes at the carotid bifurcation, proximal internal carotid artery, no significant carotid stenosis, dominant left vertebral artery is widely patent,  EEG demonstrated lateralized periodic discharge left posterior region, continued generalized slowing, lower seizure threshold of the left posterior region, moderate global encephalopathy,  MRI of the brain moderate size 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

## 2017-09-16 ENCOUNTER — Encounter: Payer: Self-pay | Admitting: Internal Medicine

## 2017-09-18 ENCOUNTER — Ambulatory Visit (INDEPENDENT_AMBULATORY_CARE_PROVIDER_SITE_OTHER): Payer: Medicare Other | Admitting: Neurology

## 2017-09-18 ENCOUNTER — Encounter: Payer: Self-pay | Admitting: Neurology

## 2017-09-18 VITALS — BP 130/70 | HR 53

## 2017-09-18 DIAGNOSIS — F0390 Unspecified dementia without behavioral disturbance: Secondary | ICD-10-CM | POA: Diagnosis not present

## 2017-09-18 DIAGNOSIS — I639 Cerebral infarction, unspecified: Secondary | ICD-10-CM | POA: Diagnosis not present

## 2017-09-18 DIAGNOSIS — R269 Unspecified abnormalities of gait and mobility: Secondary | ICD-10-CM

## 2017-09-18 DIAGNOSIS — G40909 Epilepsy, unspecified, not intractable, without status epilepticus: Secondary | ICD-10-CM | POA: Diagnosis not present

## 2017-09-18 MED ORDER — LAMOTRIGINE 150 MG PO TABS
150.0000 mg | ORAL_TABLET | Freq: Two times a day (BID) | ORAL | 11 refills | Status: DC
Start: 2017-09-18 — End: 2020-09-22

## 2017-09-18 MED ORDER — LACOSAMIDE 100 MG PO TABS: 100.0000 mg | ORAL_TABLET | Freq: Two times a day (BID) | ORAL | Status: DC

## 2017-09-18 MED ORDER — LACOSAMIDE 100 MG PO TABS: 100.0000 mg | ORAL_TABLET | Freq: Two times a day (BID) | ORAL | 11 refills | Status: DC

## 2017-09-18 NOTE — Progress Notes (Signed)
PATIENT: Brooke Gross DOB: 08/21/1940  Chief Complaint  Patient presents with  . Seizures/CVA    She resides at the [facility] in [place].  She had an extended hospitalization in January 2019 and would like to review her test results.  Says she does not remember her hospital stay.  She is currently involved in PT, OT and speech therapy daily.  Says she is still having issues with double vision.     HISTORICAL  Brooke Gross is a 77 year old female, seen in refer by her primary care doctor Harlene Salts for evaluation of seizure, initial evaluation was March 17 2017.  I reviewed and summarized the referring note, she had a past medical history of epilepsy, hypertension, hyperlipidemia, anemia, type 2 diabetes,She has been a resident of current facility at [facility] in [place] since 2017  She had her first seizure about 3 years ago in 2015, but she has memory loss, today's MOCA score is 22/30, she is a poor historian, reported most recent seizure was about 2 weeks ago in the middle of September 2018, she had transient confusion, loss of consciousness, she is on polypharmacy treatment including Keppra 1500 mg twice a day, Vimpat 100 mg twice a day, previously tried Dilantin, but no longer on her medication list,   Her son is her power of attorney, 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, lives alone until she moved to facility in 2015,   She enjoys reading, but reported worsening memory loss, she also has past medical history of depression, diabetes, hypertension,  I was able to review her medication list, aspirin 81 mg daily, ferrous sulfate 350 mg daily metoprolol 12.5 mg once a day, Lantus as needed, MiraLAX powder, mirtazapine 30 mg at that time, vitamin D, Pravachol 80 mg at bedtime, Protonix 20 mg in the morning, Effexor ER 150 mg at bedtime, Keppra 1500 mg twice, metformin 500 mg twice, Vimpat 100 mg twice, tramadol 50 mg as  needed  UPDATE Jul 01 2017: EEG on March 31, 2017 showed mild generalized slowing  MRI of the brain in October 2018 moderate anterior temporal, and bilateral perisylvian atrophy, mild supratentorium small vessel disease.  Laboratory evaluation October 2018, normal CMP, CBC, ESR, CPK, TSH, folic acid, RPR, B15  Mild elevated C-reactive protein 15,  Continue complains of mild memory loss, no recurrent seizure  UPDATE September 18 2017:   I reviewed medical record from [facility]  Laboratory in February 2019, hemoglobin of 11.4, creatinine of 0.6, normal liver functional test, LDL was elevated, A1c was elevated, normal TSH, she has  CT angiogram July 16, 2017, mild atherosclerotic changes at the carotid bifurcation, proximal internal carotid artery, no significant carotid stenosis, dominant left vertebral artery is widely patent,  EEG demonstrated lateralized periodic discharge left posterior region, continued generalized slowing, lower seizure threshold of the left posterior region, moderate global encephalopathy,  MRI of the brain moderate size 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  After discharge with rehab, she is doing better, but still not able to ambulate by herself.  REVIEW OF SYSTEMS: Full 14 system review of systems performed and notable only for as above  ALLERGIES: Allergies  Allergen Reactions  . Tramadol     Other reaction(s): Seizures  . Aspirin Hives  . Aspirin Other (  See Comments)    Pt reports hives with any dose above 16m.    HOME MEDICATIONS: Current Outpatient Medications  Medication Sig Dispense Refill  . aspirin 81 MG chewable tablet Chew 1 tablet (81 mg total) by mouth daily.    .Marland Kitchenaspirin 81 MG tablet Take 81 mg by mouth daily.    . clopidogrel (PLAVIX) 75 MG  tablet Take 1 tablet (75 mg total) by mouth daily.    . ferrous sulfate 325 (65 FE) MG tablet Take 325 mg by mouth daily with breakfast.    . insulin aspart (NOVOLOG) 100 UNIT/ML injection Before each meal 3 times a day, 140-199 - 2 units, 200-250 - 4 units, 251-299 - 6 units,  300-349 - 8 units,  350 or above 10 units. Insulin PEN if approved, provide syringes and needles if needed. 10 mL 11  . insulin glargine (LANTUS) 100 UNIT/ML injection Inject 0.4 mLs (40 Units total) into the skin daily. 10 mL 11  . insulin glargine (LANTUS) 100 UNIT/ML injection Inject 10 Units into the skin at bedtime.    .Marland Kitchenlacosamide 100 MG TABS Take 1 tablet (100 mg total) by mouth 2 (two) times daily. 60 tablet   . LevETIRAcetam (KEPPRA PO) Take 1,500 mg by mouth 2 (two) times daily.    .Marland KitchenlevETIRAcetam (KEPPRA) 750 MG tablet Take 2 tablets (1,500 mg total) by mouth 2 (two) times daily.    . metFORMIN (GLUCOPHAGE) 500 MG tablet Take by mouth 2 (two) times daily with a meal.    . metoprolol (LOPRESSOR) 50 MG tablet Take 1 tablet (50 mg total) by mouth 2 (two) times daily.    .Marland KitchenMETOPROLOL TARTRATE PO Take 12.5 mg by mouth daily.    . mirtazapine (REMERON) 30 MG tablet Take 30 mg by mouth at bedtime.    . pantoprazole (PROTONIX) 20 MG tablet Take 20 mg by mouth daily.    . phenytoin (DILANTIN) 100 MG ER capsule Take 1 capsule (100 mg total) by mouth 3 (three) times daily. (Patient taking differently: Take 100 mg by mouth 2 (two) times daily. And QHS)    . phenytoin (DILANTIN) 50 MG tablet Chew 1 tablet (50 mg total) by mouth at bedtime. 30 tablet 0  . polyethylene glycol (MIRALAX / GLYCOLAX) packet Take 17 g by mouth at bedtime.    . pravastatin (PRAVACHOL) 80 MG tablet Take 80 mg by mouth daily.    . pravastatin (PRAVACHOL) 80 MG tablet Take 80 mg by mouth daily.    . sodium phosphate (FLEET) enema Place 1 enema rectally as needed (for constipation). follow package directions    . traMADol (ULTRAM) 50 MG tablet Take by  mouth every 6 (six) hours as needed.    . venlafaxine XR (EFFEXOR-XR) 150 MG 24 hr capsule Take 150 mg by mouth at bedtime.    .Marland Kitchenvenlafaxine XR (EFFEXOR-XR) 75 MG 24 hr capsule Take 75 mg by mouth daily with breakfast.    . Vitamin D, Ergocalciferol, (DRISDOL) 50000 units CAPS capsule Take 50,000 Units by mouth every 7 (seven) days.     No current facility-administered medications for this visit.     PAST MEDICAL HISTORY: Past Medical History:  Diagnosis Date  . Abnormality of gait 12/15/2013  . Anemia   . Anxiety   . Anxiety disorder   . Bipolar 1 disorder ([place]   . CKD (chronic kidney disease)   . Depression   . Diabetes ([place]   . Epilepsy ([place]   .  Gait difficulty   . GERD (gastroesophageal reflux disease)   . Hypercholesteremia   . Hyperlipemia   . Hypertension   . Migraine   . NSTEMI, initial episode of care ([place]) 03/18/2014  . Obese   . Sciatica   . Seizures ([place])   . Tremor   . Vertigo, labyrinthine     PAST SURGICAL HISTORY: Past Surgical History:  Procedure Laterality Date  . CATARACT EXTRACTION Bilateral   . CERVICAL SPINE SURGERY    . cholecsystectomy  1977  . CHOLECYSTECTOMY    . COLONOSCOPY  2010  . degenerative disc diease    . NECK SURGERY  1990s  . TONSILLECTOMY      FAMILY HISTORY: Family History  Problem Relation Age of Onset  . Other Mother        unsure of history  . Heart attack Father   . Dementia Mother   . Congestive Heart Failure Mother   . Dementia Father     SOCIAL HISTORY:  Social History   Socioeconomic History  . Marital status: Divorced    Spouse name: Not on file  . Number of children: 1  . Years of education: college-2  . Highest education level: Not on file  Occupational History  . Occupation: Retired  . Occupation: Aeronautical engineer  . Occupation: retired  Scientific laboratory technician  . Financial resource strain: Not on file  . Food insecurity:    Worry: Not on file    Inability: Not on file  . Transportation needs:     Medical: Not on file    Non-medical: Not on file  Tobacco Use  . Smoking status: Former Smoker    Packs/day: 0.50    Years: 15.00    Pack years: 7.50    Types: Cigarettes    Last attempt to quit: 2007    Years since quitting: 12.2  . Smokeless tobacco: Current User    Types: Chew  . Tobacco comment: quit 1977  Substance and Sexual Activity  . Alcohol use: No    Comment: occasional  . Drug use: No  . Sexual activity: Not on file  Lifestyle  . Physical activity:    Days per week: Not on file    Minutes per session: Not on file  . Stress: Not on file  Relationships  . Social connections:    Talks on phone: Not on file    Gets together: Not on file    Attends religious service: Not on file    Active member of club or organization: Not on file    Attends meetings of clubs or organizations: Not on file    Relationship status: Not on file  . Intimate partner violence:    Fear of current or ex partner: Not on file    Emotionally abused: Not on file    Physically abused: Not on file    Forced sexual activity: Not on file  Other Topics Concern  . Not on file  Social History Narrative   ** Merged History Encounter **       Lives at [facility] in [place]. Left-handed. 1 cup coffee per day.     PHYSICAL EXAM   Vitals:   09/18/17 1007  BP: 130/70  Pulse: (!) 53    Not recorded      There is no height or weight on file to calculate BMI.  PHYSICAL EXAMNIATION:  Gen: NAD, conversant, well nourised, obese, well groomed  Cardiovascular: Regular rate rhythm, no peripheral edema, warm, nontender. Eyes: Conjunctivae clear without exudates or hemorrhage Neck: Supple, no carotid bruits. Pulmonary: Clear to auscultation bilaterally   NEUROLOGICAL EXAM: MMSE - Mini Mental State Exam 09/18/2017  Orientation to time 1  Orientation to Place 3  Registration 3  Attention/ Calculation 2  Recall 0  Language- name 2 objects 2  Language- repeat 1  Language-  follow 3 step command 3  Language- read & follow direction 1  Write a sentence 0  Copy design 0  Total score 16    CRANIAL NERVES: CN II: Visual fields are full to confrontation. Fundoscopic exam is normal with sharp discs and no vascular changes. Pupils are round equal and briskly reactive to light. CN III, IV, VI: extraocular movement are normal. No ptosis. CN V: Facial sensation is intact to pinprick in all 3 divisions bilaterally. Corneal responses are intact.  CN VII: Face is symmetric with normal eye closure and smile. CN VIII: Hearing is normal to rubbing fingers CN IX, X: Palate elevates symmetrically. Phonation is normal. CN XI: Head turning and shoulder shrug are intact CN XII: Tongue is midline with normal movements and no atrophy.  MOTOR: There is no pronator drift of out-stretched arms. Muscle bulk and tone are normal. Muscle strength is normal.  REFLEXES: Reflexes are 2+ and symmetric at the biceps, triceps, knees, and ankles. Plantar responses are flexor.  SENSORY: Intact to light touch, pinprick, positional sensation and vibratory sensation are intact in fingers and toes.  COORDINATION: Rapid alternating movements and fine finger movements are intact. There is no dysmetria on finger-to-nose and heel-knee-shin.    GAIT/STANCE: She needs push up to get up from seated position, cautious, unsteady   DIAGNOSTIC DATA (LABS, IMAGING, TESTING) - I reviewed patient records, labs, notes, testing and imaging myself where available.   ASSESSMENT AND PLAN  Brooke Gross is a 77 y.o. 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 Ph.D.  [facility] [address] [place] [address]Ph: [phone]: [phone] CC: PHarlene Salts MD

## 2017-10-21 ENCOUNTER — Other Ambulatory Visit: Payer: Self-pay

## 2017-11-27 ENCOUNTER — Ambulatory Visit (INDEPENDENT_AMBULATORY_CARE_PROVIDER_SITE_OTHER): Payer: Medicare Other | Admitting: Neurology

## 2017-11-27 DIAGNOSIS — G40909 Epilepsy, unspecified, not intractable, without status epilepticus: Secondary | ICD-10-CM | POA: Diagnosis not present

## 2017-11-28 NOTE — Procedures (Signed)
   HISTORY: 77 years old female, had a history of epilepsy.  TECHNIQUE:  16 channel EEG was performed based on standard 10-16 international system. One channel was dedicated to EKG, which has demonstrates normal sinus rhythm of 54 beats per minutes.  Upon awakening, the posterior background activity was irregular, diffusely slow and 4 to 5 Hz, reactive to eye opening and closure.  There was no evidence of epileptiform discharge.  Photic stimulation was performed, which induced a symmetric photic driving.  Hyperventilation was performed, there was continued background slowing  No sleep was achieved.  CONCLUSION: This is an abnormal EEG.  There is evidence of mild to moderate background slowing, indicating bi-hemisphere malfunction.  There is no evidence of epileptiform discharge.  Levert FeinsteinYijun Yan, M.D. Ph.D.  [facility] [address] [place], [place] [address] Phone: [phone] Fax:      [phone]

## 2017-12-29 NOTE — Progress Notes (Signed)
[facility]  PATIENT: Brooke Gross DOB: 1940/06/26   REASON FOR VISIT: follow up for seizure, history of stroke memory loss HISTORY FROM: Patient and transporter    HISTORY OF PRESENT ILLNESS: Brooke Gross is a 77 year old female, seen in refer by her primary care doctor Harlene Salts for evaluation of seizure, initial evaluation was March 17 2017.  I reviewed and summarized the referring note, she had a past medical history of epilepsy, hypertension, hyperlipidemia, anemia, type 2 diabetes,She has been a resident of current facility at [facility] in [place] since 2017  She had her first seizure about 3 years ago in 2015, but she has memory loss, today's MOCA score is 22/30, she is a poor historian, reported most recent seizure was about 2 weeks ago in the middle of September 2018, she had transient confusion, loss of consciousness, she is on polypharmacy treatment including Keppra 1500 mg twice a day, Vimpat 100 mg twice a day, previously tried Dilantin, but no longer on her medication list,   Her son is her power of attorney, 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, lives alone until she moved to facility in 2015,   She enjoys reading, but reported worsening memory loss, she also has past medical history of depression, diabetes, hypertension,  I was able to review her medication list, aspirin 81 mg daily, ferrous sulfate 350 mg daily metoprolol 12.5 mg once a day, Lantus as needed, MiraLAX powder, mirtazapine 30 mg at that time, vitamin D, Pravachol 80 mg at bedtime, Protonix 20 mg in the morning, Effexor ER 150 mg at bedtime, Keppra 1500 mg twice, metformin 500 mg twice, Vimpat 100 mg twice, tramadol 50 mg as needed  UPDATE Jul 01 2017:YY EEG on March 31, 2017 showed mild generalized slowing  MRI of the brain in October 2018 moderate anterior temporal, and bilateral perisylvian atrophy, mild  supratentorium small vessel disease.  Laboratory evaluation October 2018, normal CMP, CBC, ESR, CPK, TSH, folic acid, RPR, W10  Mild elevated C-reactive protein 15,  Continue complains of mild memory loss, no recurrent seizure  UPDATE September 18 2017: YY  I reviewed medical record from [facility]  Laboratory in February 2019, hemoglobin of 11.4, creatinine of 0.6, normal liver functional test, LDL was elevated, A1c was elevated, normal TSH, she has  CT angiogram July 16, 2017, mild atherosclerotic changes at the carotid bifurcation, proximal internal carotid artery, no significant carotid stenosis, dominant left vertebral artery is widely patent,  EEG demonstrated lateralized periodic discharge left posterior region, continued generalized slowing, lower seizure threshold of the left posterior region, moderate global encephalopathy,  MRI of the brain moderate size 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  After discharge with rehab, she is doing better, but still not able to ambulate by herself.  UPDATE 7/16/19CM Brooke Gross, 77 year old female returns for follow-up with history of seizure disorder and mild memory loss she has not had any seizures since last seen.  She is currently taking Vimpat 200 twice daily, Lamictal 250 twice daily and Dilantin 200 mg daily.  EEG on 6/13/19T abnormal EEG.  There is evidence of mild to moderate background slowing, indicating bi-hemisphere malfunction.  There is no evidence of epileptiform discharge.  She remains in a skilled facility she requires assistance to ambulate.  She  mostly stays in a wheelchair.  She has good appetite and sleeps well at night.  She has past medical history of CVA diabetes hypertension hyperlipidemia and major depressive disorder.  She  returns for reevaluation   REVIEW OF SYSTEMS: Full 14 system review of systems performed and notable only for those listed, all others are neg:  Constitutional: neg  Cardiovascular: neg Ear/Nose/Throat: neg  Skin: neg Eyes: neg Respiratory: neg Gastroitestinal: neg  Hematology/Lymphatic: neg  Endocrine: neg Musculoskeletal: Walking difficulty Allergy/Immunology: neg Neurological: Memory loss, seizure disorder Psychiatric: neg Sleep : neg   ALLERGIES: Allergies  Allergen Reactions  . Tramadol     Other reaction(s): Seizures  . Aspirin Hives  . Aspirin Other (See Comments)    Pt reports hives with any dose above '81mg'$ .    HOME MEDICATIONS: Outpatient Medications Prior to Visit  Medication Sig Dispense Refill  . acetaminophen (TYLENOL) 325 MG tablet Take 650 mg by mouth every 6 (six) hours as needed.     Marland Kitchen atorvastatin (LIPITOR) 40 MG tablet Take 40 mg by mouth daily.    . clopidogrel (PLAVIX) 75 MG tablet Take 1 tablet (75 mg total) by mouth daily.    . insulin glargine (LANTUS) 100 UNIT/ML injection Inject 10 Units into the skin at bedtime.    . Lacosamide 100 MG TABS Take 1 tablet (100 mg total) by mouth 2 (two) times daily. 60 tablet 11  . lamoTRIgine (LAMICTAL) 150 MG tablet Take 1 tablet (150 mg total) by mouth 2 (two) times daily. 60 tablet 11  . lisinopril (PRINIVIL,ZESTRIL) 10 MG tablet Take 10 mg by mouth daily.    Marland Kitchen LORazepam (ATIVAN) 2 MG/ML concentrated solution Take 1 mg by mouth as needed for anxiety (Inject '1mg'$  IM as needed for seizure.  Repeat x 1 if needed.  Send to ER for any further seizures.).    Marland Kitchen Magnesium Oxide 500 MG TABS Take 500 mg by mouth 2 (two) times daily.    . Melatonin 3 MG TABS Take by mouth.    . metFORMIN (GLUCOPHAGE) 500 MG tablet Take 500 mg by mouth daily with breakfast.     . mirtazapine (REMERON) 30 MG tablet Take 30 mg by mouth at bedtime.    . pantoprazole (PROTONIX) 20 MG tablet Take 20 mg by mouth daily.    Marland Kitchen PARoxetine (PAXIL)  40 MG tablet Take 40 mg by mouth daily.    . phenytoin (DILANTIN) 100 MG ER capsule Take by mouth 2 (two) times daily.    . polyethylene glycol (MIRALAX / GLYCOLAX) packet Take 17 g by mouth at bedtime.    . Vitamin D, Ergocalciferol, (DRISDOL) 50000 units CAPS capsule Take 50,000 Units by mouth every 30 (thirty) days.     Marland Kitchen alum & mag hydroxide-simeth (MYLANTA) 200-200-20 MG/5ML suspension Take 10 mLs by mouth every 2 (two) hours as needed for indigestion or heartburn.    . ferrous sulfate 325 (65 FE) MG tablet Take 325 mg by mouth daily with breakfast.     No facility-administered medications prior to visit.     PAST MEDICAL HISTORY: Past Medical History:  Diagnosis Date  . Abnormality of gait 12/15/2013  . Anemia   . Anxiety   . Anxiety disorder   . Bipolar 1 disorder ([place])   . CKD (chronic kidney disease)   . Depression   . Diabetes ([place])   . Epilepsy ([place])   . Gait difficulty   . GERD (gastroesophageal reflux disease)   . Hypercholesteremia   .  Hyperlipemia   . Hypertension   . Migraine   . NSTEMI, initial episode of care ([place]) 03/18/2014  . Obese   . Sciatica   . Seizures ([place])   . Tremor   . Vertigo, labyrinthine     PAST SURGICAL HISTORY: Past Surgical History:  Procedure Laterality Date  . CATARACT EXTRACTION Bilateral   . CERVICAL SPINE SURGERY    . cholecsystectomy  1977  . CHOLECYSTECTOMY    . COLONOSCOPY  2010  . degenerative disc diease    . NECK SURGERY  1990s  . TONSILLECTOMY      FAMILY HISTORY: Family History  Problem Relation Age of Onset  . Other Mother        unsure of history  . Heart attack Father   . Dementia Mother   . Congestive Heart Failure Mother   . Dementia Father     SOCIAL HISTORY: Social History   Socioeconomic History  . Marital status: Divorced    Spouse name: Not on file  . Number of children: 1  . Years of education: college-2  . Highest education level: Not on file  Occupational History  . Occupation: Retired   . Occupation: Aeronautical engineer  . Occupation: retired  Scientific laboratory technician  . Financial resource strain: Not on file  . Food insecurity:    Worry: Not on file    Inability: Not on file  . Transportation needs:    Medical: Not on file    Non-medical: Not on file  Tobacco Use  . Smoking status: Former Smoker    Packs/day: 0.50    Years: 15.00    Pack years: 7.50    Types: Cigarettes    Last attempt to quit: 2007    Years since quitting: 12.5  . Smokeless tobacco: Current User    Types: Chew  . Tobacco comment: quit 1977  Substance and Sexual Activity  . Alcohol use: No    Comment: occasional  . Drug use: No  . Sexual activity: Not on file  Lifestyle  . Physical activity:    Days per week: Not on file    Minutes per session: Not on file  . Stress: Not on file  Relationships  . Social connections:    Talks on phone: Not on file    Gets together: Not on file    Attends religious service: Not on file    Active member of club or organization: Not on file    Attends meetings of clubs or organizations: Not on file    Relationship status: Not on file  . Intimate partner violence:    Fear of current or ex partner: Not on file    Emotionally abused: Not on file    Physically abused: Not on file    Forced sexual activity: Not on file  Other Topics Concern  . Not on file  Social History Narrative   Merged History Encounter        Lives at [facility] in [place]. Left-handed. 1 cup coffee per day.     PHYSICAL EXAM  Vitals:   12/30/17 1303  BP: (!) 150/80  Pulse: 60  Height: '5\' 3"'$  (1.6 m)   Body mass index is 33.83 kg/m.  Generalized: Well developed, obese female in no acute distress well-groomed Head: normocephalic and atraumatic,. Oropharynx benign  Neck: Supple, no carotid bruits  Cardiac: Regular rate rhythm, no murmur  Musculoskeletal: No deformity   Neurological examination   Mentation: Alert AFT 7.  MMSE -  Mini Mental State Exam 12/30/2017 09/18/2017  Not  completed: (No Data) -  Orientation to time 1 1  Orientation to Place 4 3  Registration 3 3  Attention/ Calculation 1 2  Recall 1 0  Language- name 2 objects 2 2  Language- repeat 1 1  Language- follow 3 step command 3 3  Language- read & follow direction 1 1  Write a sentence 1 0  Copy design 0 0  Total score 18 16   Follows all commands speech and language fluent.   Cranial nerve II-XII: Pupils were equal round reactive to light extraocular movements were full, visual field were full on confrontational test. Facial sensation and strength were normal. hearing was intact to finger rubbing bilaterally. Uvula tongue midline. head turning and shoulder shrug were normal and symmetric.Tongue protrusion into cheek strength was normal. Motor: normal bulk and tone, full strength in the BUE, BLE, Sensory: normal and symmetric to light touch,  Coordination: finger-nose-finger,  no dysmetria Reflexes: Brachioradialis 2/2, biceps 2/2, triceps 2/2, patellar 2/2, Achilles 2/2, plantar responses were flexor bilaterally. Gait and Station: In wheelchair not ambulated  DIAGNOSTIC DATA (LABS, IMAGING, TESTING) - I reviewed patient records, labs, notes, testing and imaging myself where available.  Lab Results  Component Value Date   WBC 8.4 03/17/2017   HGB 12.7 03/17/2017   HCT 38.8 03/17/2017   MCV 92 03/17/2017   PLT 245 03/17/2017      Component Value Date/Time   NA 144 03/17/2017 1146   K 5.1 03/17/2017 1146   CL 102 03/17/2017 1146   CO2 21 03/17/2017 1146   GLUCOSE 89 03/17/2017 1146   GLUCOSE 178 (H) 04/22/2014 0700   BUN 24 03/17/2017 1146   CREATININE 0.99 03/17/2017 1146   CALCIUM 9.8 03/17/2017 1146   PROT 7.1 03/17/2017 1146   ALBUMIN 4.5 03/17/2017 1146   AST 19 03/17/2017 1146   ALT 9 03/17/2017 1146   ALKPHOS 127 (H) 03/17/2017 1146   BILITOT <0.2 03/17/2017 1146   GFRNONAA 56 (L) 03/17/2017 1146   GFRAA 65 03/17/2017 1146   Lab Results  Component Value Date   TRIG  111 03/23/2014   Lab Results  Component Value Date   HGBA1C 7.6 (H) 03/31/2014   Lab Results  Component Value Date   FAOZHYQM57 846 03/17/2017   Lab Results  Component Value Date   TSH 2.920 03/17/2017      ASSESSMENT AND PLAN  77 y.o. female here to follow-up for dementia, history of stroke in January 2019 involving left temporal lobe on Plavix with vascular risk factors of hyperlipidemia hypertension and diabetes.  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  Dr. Krista Blue had tried to stop her Dilantin in the past that she had a seizure Rpeat EEG on 6/13/19T abnormal EEG.  There is evidence of mild to moderate background slowing, indicating bi-hemisphere malfunction.  There is no evidence of epileptiform discharge.  She will follow-up in 6 months                 Dennie Bible, [facility], [facility], APRN  [facility] [address] [place], [place] [address] [phone]

## 2017-12-30 ENCOUNTER — Ambulatory Visit: Payer: Medicare Other | Admitting: Neurology

## 2017-12-30 ENCOUNTER — Encounter: Payer: Self-pay | Admitting: Nurse Practitioner

## 2017-12-30 ENCOUNTER — Ambulatory Visit (INDEPENDENT_AMBULATORY_CARE_PROVIDER_SITE_OTHER): Payer: Medicare Other | Admitting: Nurse Practitioner

## 2017-12-30 VITALS — BP 150/80 | HR 60 | Ht 63.0 in

## 2017-12-30 DIAGNOSIS — I639 Cerebral infarction, unspecified: Secondary | ICD-10-CM | POA: Diagnosis not present

## 2017-12-30 DIAGNOSIS — R413 Other amnesia: Secondary | ICD-10-CM

## 2017-12-30 DIAGNOSIS — R269 Unspecified abnormalities of gait and mobility: Secondary | ICD-10-CM

## 2017-12-30 DIAGNOSIS — R569 Unspecified convulsions: Secondary | ICD-10-CM

## 2017-12-30 DIAGNOSIS — I10 Essential (primary) hypertension: Secondary | ICD-10-CM | POA: Diagnosis not present

## 2017-12-30 NOTE — Patient Instructions (Signed)
Per skilled sheet 

## 2018-07-14 ENCOUNTER — Ambulatory Visit: Payer: Medicare Other | Admitting: Nurse Practitioner

## 2019-09-28 ENCOUNTER — Other Ambulatory Visit: Payer: Self-pay

## 2020-09-21 ENCOUNTER — Observation Stay (HOSPITAL_COMMUNITY)
Admission: EM | Admit: 2020-09-21 | Discharge: 2020-09-22 | Disposition: A | Discharge status: Home / Self Care | Payer: Medicare Other | Attending: Family Medicine | Admitting: Family Medicine

## 2020-09-21 ENCOUNTER — Emergency Department (HOSPITAL_COMMUNITY): Payer: Medicare Other

## 2020-09-21 ENCOUNTER — Other Ambulatory Visit: Payer: Self-pay

## 2020-09-21 ENCOUNTER — Encounter (HOSPITAL_COMMUNITY): Payer: Self-pay

## 2020-09-21 DIAGNOSIS — N179 Acute kidney failure, unspecified: Secondary | ICD-10-CM | POA: Diagnosis not present

## 2020-09-21 DIAGNOSIS — R4182 Altered mental status, unspecified: Secondary | ICD-10-CM

## 2020-09-21 DIAGNOSIS — Z79899 Other long term (current) drug therapy: Secondary | ICD-10-CM | POA: Diagnosis not present

## 2020-09-21 DIAGNOSIS — Z7984 Long term (current) use of oral hypoglycemic drugs: Secondary | ICD-10-CM | POA: Diagnosis not present

## 2020-09-21 DIAGNOSIS — E1165 Type 2 diabetes mellitus with hyperglycemia: Secondary | ICD-10-CM

## 2020-09-21 DIAGNOSIS — Z20822 Contact with and (suspected) exposure to covid-19: Secondary | ICD-10-CM | POA: Diagnosis not present

## 2020-09-21 DIAGNOSIS — Z794 Long term (current) use of insulin: Secondary | ICD-10-CM | POA: Insufficient documentation

## 2020-09-21 DIAGNOSIS — I10 Essential (primary) hypertension: Secondary | ICD-10-CM | POA: Diagnosis present

## 2020-09-21 DIAGNOSIS — E1122 Type 2 diabetes mellitus with diabetic chronic kidney disease: Secondary | ICD-10-CM | POA: Diagnosis not present

## 2020-09-21 DIAGNOSIS — R269 Unspecified abnormalities of gait and mobility: Secondary | ICD-10-CM

## 2020-09-21 DIAGNOSIS — N189 Chronic kidney disease, unspecified: Secondary | ICD-10-CM | POA: Diagnosis not present

## 2020-09-21 DIAGNOSIS — Z87891 Personal history of nicotine dependence: Secondary | ICD-10-CM | POA: Diagnosis not present

## 2020-09-21 DIAGNOSIS — T68XXXA Hypothermia, initial encounter: Secondary | ICD-10-CM

## 2020-09-21 DIAGNOSIS — R569 Unspecified convulsions: Secondary | ICD-10-CM

## 2020-09-21 DIAGNOSIS — R001 Bradycardia, unspecified: Secondary | ICD-10-CM

## 2020-09-21 DIAGNOSIS — K219 Gastro-esophageal reflux disease without esophagitis: Secondary | ICD-10-CM

## 2020-09-21 DIAGNOSIS — E875 Hyperkalemia: Secondary | ICD-10-CM | POA: Insufficient documentation

## 2020-09-21 DIAGNOSIS — R2681 Unsteadiness on feet: Secondary | ICD-10-CM | POA: Insufficient documentation

## 2020-09-21 DIAGNOSIS — G40919 Epilepsy, unspecified, intractable, without status epilepticus: Secondary | ICD-10-CM | POA: Diagnosis not present

## 2020-09-21 DIAGNOSIS — Y900 Blood alcohol level of less than 20 mg/100 ml: Secondary | ICD-10-CM | POA: Insufficient documentation

## 2020-09-21 DIAGNOSIS — Z7982 Long term (current) use of aspirin: Secondary | ICD-10-CM | POA: Insufficient documentation

## 2020-09-21 DIAGNOSIS — R400 Somnolence: Secondary | ICD-10-CM

## 2020-09-21 DIAGNOSIS — Z7902 Long term (current) use of antithrombotics/antiplatelets: Secondary | ICD-10-CM | POA: Diagnosis not present

## 2020-09-21 DIAGNOSIS — E785 Hyperlipidemia, unspecified: Secondary | ICD-10-CM

## 2020-09-21 DIAGNOSIS — E782 Mixed hyperlipidemia: Secondary | ICD-10-CM

## 2020-09-21 HISTORY — DX: Dysphagia, unspecified: R13.10

## 2020-09-21 HISTORY — DX: Other malaise: R53.81

## 2020-09-21 HISTORY — DX: Immunodeficiency, unspecified: D84.9

## 2020-09-21 HISTORY — DX: Major depressive disorder, single episode, unspecified: F32.9

## 2020-09-21 HISTORY — DX: Hyperlipidemia, unspecified: E78.5

## 2020-09-21 LAB — CBC WITH DIFFERENTIAL/PLATELET
Abs Immature Granulocytes: 0.05 10*3/uL (ref 0.00–0.07)
Basophils Absolute: 0 10*3/uL (ref 0.0–0.1)
Basophils Relative: 0 %
Eosinophils Absolute: 0.3 10*3/uL (ref 0.0–0.5)
Eosinophils Relative: 4 %
HCT: 33.9 % — ABNORMAL LOW (ref 36.0–46.0)
Hemoglobin: 10.6 g/dL — ABNORMAL LOW (ref 12.0–15.0)
Immature Granulocytes: 1 %
Lymphocytes Relative: 23 %
Lymphs Abs: 1.5 10*3/uL (ref 0.7–4.0)
MCH: 30.9 pg (ref 26.0–34.0)
MCHC: 31.3 g/dL (ref 30.0–36.0)
MCV: 98.8 fL (ref 80.0–100.0)
Monocytes Absolute: 0.7 10*3/uL (ref 0.1–1.0)
Monocytes Relative: 10 %
Neutro Abs: 4.2 10*3/uL (ref 1.7–7.7)
Neutrophils Relative %: 62 %
Platelets: 291 10*3/uL (ref 150–400)
RBC: 3.43 MIL/uL — ABNORMAL LOW (ref 3.87–5.11)
RDW: 12.9 % (ref 11.5–15.5)
WBC: 6.8 10*3/uL (ref 4.0–10.5)
nRBC: 0 % (ref 0.0–0.2)

## 2020-09-21 LAB — URINALYSIS, ROUTINE W REFLEX MICROSCOPIC
Bacteria, UA: NONE SEEN
Bilirubin Urine: NEGATIVE
Glucose, UA: NEGATIVE mg/dL
Hgb urine dipstick: NEGATIVE
Ketones, ur: NEGATIVE mg/dL
Nitrite: NEGATIVE
Protein, ur: NEGATIVE mg/dL
Specific Gravity, Urine: 1.009 (ref 1.005–1.030)
pH: 7 (ref 5.0–8.0)

## 2020-09-21 LAB — COMPREHENSIVE METABOLIC PANEL
ALT: 14 U/L (ref 0–44)
AST: 18 U/L (ref 15–41)
Albumin: 3.7 g/dL (ref 3.5–5.0)
Alkaline Phosphatase: 95 U/L (ref 38–126)
Anion gap: 10 (ref 5–15)
BUN: 42 mg/dL — ABNORMAL HIGH (ref 8–23)
CO2: 23 mmol/L (ref 22–32)
Calcium: 9 mg/dL (ref 8.9–10.3)
Chloride: 105 mmol/L (ref 98–111)
Creatinine, Ser: 1.39 mg/dL — ABNORMAL HIGH (ref 0.44–1.00)
GFR, Estimated: 39 mL/min — ABNORMAL LOW (ref >60)
Glucose, Bld: 144 mg/dL — ABNORMAL HIGH (ref 70–99)
Potassium: 5.3 mmol/L — ABNORMAL HIGH (ref 3.5–5.1)
Sodium: 138 mmol/L (ref 135–145)
Total Bilirubin: 0.3 mg/dL (ref 0.3–1.2)
Total Protein: 6.9 g/dL (ref 6.5–8.1)

## 2020-09-21 LAB — RESP PANEL BY RT-PCR (FLU A&B, COVID) ARPGX2
Influenza A by PCR: NEGATIVE
Influenza B by PCR: NEGATIVE
SARS Coronavirus 2 by RT PCR: NEGATIVE

## 2020-09-21 LAB — ETHANOL: Alcohol, Ethyl (B): <10 mg/dL (ref <10)

## 2020-09-21 LAB — MAGNESIUM: Magnesium: 2.2 mg/dL (ref 1.7–2.4)

## 2020-09-21 LAB — GLUCOSE, CAPILLARY: Glucose-Capillary: 108 mg/dL — ABNORMAL HIGH (ref 70–99)

## 2020-09-21 LAB — PHOSPHORUS: Phosphorus: 4.1 mg/dL (ref 2.5–4.6)

## 2020-09-21 LAB — PHENYTOIN LEVEL, TOTAL: Phenytoin Lvl: 19.4 ug/mL (ref 10.0–20.0)

## 2020-09-21 MED ORDER — LORAZEPAM 2 MG/ML IJ SOLN
INTRAMUSCULAR | Status: AC
  Administered 2020-09-21: 2 mg via INTRAVENOUS
  Filled 2020-09-21: qty 1

## 2020-09-21 MED ORDER — SODIUM CHLORIDE 0.9 % IV BOLUS
500.0000 mL | Freq: Once | INTRAVENOUS | Status: AC
  Administered 2020-09-21: 500 mL via INTRAVENOUS

## 2020-09-21 MED ORDER — ENOXAPARIN SODIUM 40 MG/0.4ML ~~LOC~~ SOLN
40.0000 mg | SUBCUTANEOUS | Status: DC
  Administered 2020-09-22: 40 mg via SUBCUTANEOUS
  Filled 2020-09-21: qty 0.4

## 2020-09-21 MED ORDER — LORAZEPAM 2 MG/ML IJ SOLN: 2.0000 mg | Freq: Once | INTRAMUSCULAR | Status: AC

## 2020-09-21 NOTE — H&P (Incomplete)
History and Physical  Brooke Gross OXB:[id number] DOB: Jul 28, 1940 DOA: 09/21/2020  Referring physician: Maia Plan, MD PCP: Galvin Proffer, MD  Patient coming from: [facility]  Chief Complaint: Altered mental status   HPI: Brooke Gross is a 80 y.o. female with medical history significant for seizures, hypertension, T2DM, hyperlipidemia, GERD and prior stroke who presents to the emergency department from [facility] due to altered mental status.  Patient was unable to provide a history at this time due to altered mental status, history was obtained from ED physician and ED medical record.  Per report, Nursing facility reported that patient sustained a fall with head injury about 1 week ago, she was evaluated at the ER and discharged with return precautions, patient has been functioning at her baseline mentation was noted to be less responsive 3 PM, EMS was activated and patient was taken to the ED for further evaluation and management.  Patient was reported to be able to stand but was unable to ambulate at baseline and that she responds yes/no verbally, but not conversational.  ED Course:  In the emergency department, initially hypothermic with a temperature of 97.41F, this improved to 97.63F, she was bradycardic with HR in high 40s to high 50s.  BP 133/51 and O2 sat 100% on room air.  Work-up in the ED showed normocytic anemia, hypokalemia, BUN/creatinine 42/1.39 (most recent labs for comparison was 3 years ago with creatinine of 0.99). Chest x-ray showed no active disease.   Review of Systems: Review of systems as noted in the HPI. All other systems reviewed and are negative.   Past Medical History:  Diagnosis Date  . Abnormality of gait 12/15/2013  . Anemia   . Anxiety   . Anxiety disorder   . Bipolar 1 disorder (HCC)   . CKD (chronic kidney disease)   . Depression   . Diabetes (HCC)   . Dysphagia   . Epilepsy (HCC)   . Gait difficulty   . GERD  (gastroesophageal reflux disease)   . Hypercholesteremia   . Hyperlipemia   . Hyperlipidemia   . Hypertension   . Immunodeficiency (HCC)   . Major depressive disorder   . Malaise   . Migraine   . NSTEMI, initial episode of care (HCC) 03/18/2014  . Obese   . Sciatica   . Seizures (HCC)   . Tremor   . Vertigo, labyrinthine    Past Surgical History:  Procedure Laterality Date  . CATARACT EXTRACTION Bilateral   . CERVICAL SPINE SURGERY    . cholecsystectomy  1977  . CHOLECYSTECTOMY    . COLONOSCOPY  2010  . degenerative disc diease    . NECK SURGERY  1990s  . TONSILLECTOMY      Social History:  reports that she quit smoking about 15 years ago. Her smoking use included cigarettes. She has a 7.50 pack-year smoking history. Her smokeless tobacco use includes chew. She reports that she does not drink alcohol and does not use drugs.   Allergies  Allergen Reactions  . Tramadol     Other reaction(s): Seizures  . Aspirin Hives  . Aspirin Other (See Comments)    Pt reports hives with any dose above 81mg .    Family History  Problem Relation Age of Onset  . Other Mother        unsure of history  . Heart attack Father   . Dementia Mother   . Congestive Heart Failure Mother   . Dementia Father     ***  Prior to Admission medications   Medication Sig Start Date End Date Taking? Authorizing Provider  acetaminophen (TYLENOL) 325 MG tablet Take 650 mg by mouth every 6 (six) hours as needed.    Yes [provider]  aspirin EC 81 MG tablet Take 81 mg by mouth daily. Swallow whole.   Yes [provider]  lamoTRIgine (LAMICTAL) 150 MG tablet Take 1 tablet (150 mg total) by mouth 2 (two) times daily. Patient taking differently: Take 450 mg by mouth 2 (two) times daily. 09/18/17  Yes Levert Feinstein, MD  lisinopril (PRINIVIL,ZESTRIL) 10 MG tablet Take 10 mg by mouth daily.   Yes [provider]  loperamide (IMODIUM A-D) 2 MG tablet Take 2 mg by mouth 4 (four) times  daily as needed for diarrhea or loose stools.   Yes [provider]  LORazepam (ATIVAN) 1 MG tablet Take 1 mg by mouth 3 (three) times daily. 06/20/20  Yes [provider]  LORazepam (ATIVAN) 2 MG/ML concentrated solution Take 1 mg by mouth as needed for anxiety (Inject 1mg  IM as needed for seizure.  Repeat x 1 if needed.  Send to ER for any further seizures.).   Yes [provider]  Melatonin 3 MG TABS Take by mouth.   Yes [provider]  metFORMIN (GLUCOPHAGE) 500 MG tablet Take 500 mg by mouth daily with breakfast.    Yes [provider]  mirtazapine (REMERON) 7.5 MG tablet Take 7.5 mg by mouth at bedtime. 07/02/20  Yes [provider]  NOVOLOG FLEXPEN 100 UNIT/ML FlexPen Inject into the skin See admin instructions. Per sliding scale 05/20/20  Yes [provider]  OLANZapine (ZYPREXA) 10 MG tablet Take 10 mg by mouth at bedtime.   Yes [provider]  phenytoin (DILANTIN) 50 MG tablet Chew 150 mg by mouth at bedtime.   Yes [provider]  Vitamin D, Ergocalciferol, (DRISDOL) 50000 units CAPS capsule Take 50,000 Units by mouth every 30 (thirty) days.    Yes [provider]  zinc gluconate 50 MG tablet Take 50 mg by mouth daily.   Yes [provider]  alum & mag hydroxide-simeth (MAALOX/MYLANTA) 200-200-20 MG/5ML suspension Take 10 mLs by mouth every 2 (two) hours as needed for indigestion or heartburn. Patient not taking: Reported on 09/21/2020    [provider]  atorvastatin (LIPITOR) 40 MG tablet Take 40 mg by mouth daily. Patient not taking: Reported on 09/21/2020    [provider]  clopidogrel (PLAVIX) 75 MG tablet Take 1 tablet (75 mg total) by mouth daily. Patient not taking: Reported on 09/21/2020 04/07/14   04/09/14, MD  ferrous sulfate 325 (65 FE) MG tablet Take 325 mg by mouth daily with breakfast. Patient not taking: Reported on 09/21/2020    [provider]   insulin glargine (LANTUS) 100 UNIT/ML injection Inject 10 Units into the skin at bedtime. Patient not taking: Reported on 09/21/2020    [provider]  Lacosamide 100 MG TABS Take 1 tablet (100 mg total) by mouth 2 (two) times daily. Patient not taking: Reported on 09/21/2020 09/18/17   11/18/17, MD  Magnesium Oxide 500 MG TABS Take 500 mg by mouth 2 (two) times daily. Patient not taking: Reported on 09/21/2020    [provider]  mirtazapine (REMERON) 30 MG tablet Take 30 mg by mouth at bedtime. Patient not taking: Reported on 09/21/2020    [provider]  nystatin (MYCOSTATIN/NYSTOP) powder Apply topically. Patient not taking: Reported on  09/21/2020    [provider]  pantoprazole (PROTONIX) 20 MG tablet Take 20 mg by mouth daily.    [provider]  PARoxetine (PAXIL) 40 MG tablet Take 40 mg by mouth daily.    [provider]  phenytoin (DILANTIN) 100 MG ER capsule Take by mouth 2 (two) times daily. Patient not taking: Reported on 09/21/2020    [provider]  polyethylene glycol (MIRALAX / GLYCOLAX) packet Take 17 g by mouth at bedtime.    [provider]  QUEtiapine (SEROQUEL) 50 MG tablet Take by mouth. 07/08/20   [provider]  sertraline (ZOLOFT) 100 MG tablet Take 1 tablet by mouth daily. 06/30/20   [provider]    Physical Exam: BP (!) 117/47   Pulse (!) 48   Temp (!) 97.1 F (36.2 C) (Rectal)   Resp 17   Ht 5' (1.524 m)   Wt 81.6 kg   SpO2 100%   BMI 35.15 kg/m   . General: 80 y.o. year-old female well developed well nourished in no acute distress.  Alert and oriented x3. . Cardiovascular: Regular rate and rhythm with no rubs or gallops.  No thyromegaly or JVD noted.  No lower extremity edema. 2/4 pulses in all 4 extremities. Marland Kitchen Respiratory: Clear to auscultation with no wheezes or rales. Good inspiratory effort. . Abdomen: Soft nontender nondistended with normal bowel sounds x4  quadrants. . Muskuloskeletal: No cyanosis, clubbing or edema noted bilaterally . Neuro: CN II-XII intact, strength, sensation, reflexes . Skin: No ulcerative lesions noted or rashes . Psychiatry: Judgement and insight appear normal. Mood is appropriate for condition and setting          Labs on Admission:  Basic Metabolic Panel: Recent Labs  Lab 09/21/20 1814  NA 138  K 5.3*  CL 105  CO2 23  GLUCOSE 144*  BUN 42*  CREATININE 1.39*  CALCIUM 9.0   Liver Function Tests: Recent Labs  Lab 09/21/20 1814  AST 18  ALT 14  ALKPHOS 95  BILITOT 0.3  PROT 6.9  ALBUMIN 3.7   No results for input(s): LIPASE, AMYLASE in the last 168 hours. No results for input(s): AMMONIA in the last 168 hours. CBC: Recent Labs  Lab 09/21/20 1814  WBC 6.8  NEUTROABS 4.2  HGB 10.6*  HCT 33.9*  MCV 98.8  PLT 291   Cardiac Enzymes: No results for input(s): CKTOTAL, CKMB, CKMBINDEX, TROPONINI in the last 168 hours.  BNP (last 3 results) No results for input(s): BNP in the last 8760 hours.  ProBNP (last 3 results) No results for input(s): PROBNP in the last 8760 hours.  CBG: No results for input(s): GLUCAP in the last 168 hours.  Radiological Exams on Admission: CT Head Wo Contrast  Result Date: 09/21/2020 CLINICAL DATA:  Mental status change.  Unknown cause. EXAM: CT HEAD WITHOUT CONTRAST TECHNIQUE: Contiguous axial images were obtained from the base of the skull through the vertex without intravenous contrast. COMPARISON:  MR head 03/25/2014 FINDINGS: Brain: Cerebral ventricle sizes are concordant with the degree of cerebral volume loss. Patchy and confluent areas of decreased attenuation are noted throughout the deep and periventricular white matter of the cerebral hemispheres bilaterally, compatible with chronic microvascular ischemic disease. No evidence of large-territorial acute infarction. No parenchymal hemorrhage. No mass lesion. No extra-axial collection. No mass effect or midline  shift. No hydrocephalus. Basilar cisterns are patent. Vascular: No hyperdense vessel. Skull: No acute fracture or focal lesion. Sinuses/Orbits: Paranasal sinuses and mastoid air cells  are clear. The orbits are unremarkable. Other: None. IMPRESSION: No acute intracranial abnormality. Electronically Signed   By: Tish FredericksonMorgane  Naveau M.D.   On: 09/21/2020 18:15    EKG: I independently viewed the EKG done and my findings are as followed: ***   Assessment/Plan Present on Admission: **None**  Active Problems:   Seizures (HCC)   DVT prophylaxis: ***   Code Status: ***   Family Communication: ***   Disposition Plan: ***   Consults called: ***   Admission status: ***     Frankey Shownladapo Adefeso MD [facility] Pager [phone]  If 7PM-7AM, please contact night-coverage [email or web address] Password [email or web address]  09/21/2020, 8:42 PM

## 2020-09-21 NOTE — ED Notes (Signed)
This RN went into room to reassess pt's blood pressure. Pt being uncooperative with staff, stating that "I said no, you all are being mean to me and I want you to leave me alone". Pt in no distress, will continue to monitor.

## 2020-09-21 NOTE — ED Notes (Signed)
Assumed care of patient at this time. Pt alert and oriented to person and place at this time. Urine collected per order. Provider aware of pt's orientation status change.

## 2020-09-21 NOTE — Progress Notes (Signed)
[facility] TeleNeurology Consult Services  Stat Consult  Date of Service:   09/21/2020 19:33:23  Diagnosis:     .  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 One was unwitnessed in nursing home and one in ER. It was described as GTC patient s/p ativan 2mg  and has been postictal for some time now. She is beginning to wake up and is more alert although remains somewhat confused. She is following commands, states her name and DOB. Exam nonfocal. Unclear etiology for breakthrough seizure, continue current AED, will need to reassess patient once more alert. Check AED levels and fu with MRI brain imaging.  CT HEAD: Showed No Acute Hemorrhage or Acute Core Infarct Reviewed  Our recommendations are outlined below.  Diagnostic Studies: MRI brain w/wo contrast  Laboratory Studies: Comprehensive Metabolic profile Lactic acid magnesium, phosphorus AED levels  Nursing Recommendations: Maintain Euglycemia and Euthermia Once stable neuro checks q 4hrs  Seizure precautions: Seizure precautions including no driving for state mandated time frame were discussed with patient with clear understanding  DVT Prophylaxis: Choice of Primary Team  Disposition: Neurology will follow  Free Text: continue vimpat home dose continue lamictal continue dilantin ativan 1mg  PRN for seizures >45minutes.  Additional Recommendations:: check lamictal level would not increase vimpat given bradycardia.   Metrics: [facility] Notification Time: 09/21/2020 19:31:17 Stamp Time: 09/21/2020 19:33:23 Callback Response Time: 09/21/2020 19:34:13   ----------------------------------------------------------------------------------------------------  Chief Complaint: breakthrough  seizure  History of Present Illness: Patient is a 80 year old Female.  Patient is a 80 yr old woman with history of epilepsy who presents after being found unresponsive at a nursing facility. They called EMS at 4:30pm. She had two episodes of breakthrough seizures, GTC. The episode at nursing home was unwitnessed but the one in ED was GTC lasting several minutes. She is s/p IV hydration and 2mg  of ativan. She had a fall a week ago with head injury. CT head was obtained but was unremarkable. She is postictal and somewhat more alert than before, she is able to state her birthday. Before only responsive to pain. Her baseline is AAOx4 Per review of paperwork she is dilantin, lamictal and vimpat. Dilantin level is therapeutic. no leukocytosis, UA negative, Na 138   Past Medical History:     . Hypertension     . Diabetes Mellitus     . Hyperlipidemia     . Stroke     . There is NO history of Atrial Fibrillation     . There is NO history of Coronary Artery Disease     . epilepsy  Anticoagulant use:  No  Antiplatelet use: Yes aspirin and plavix   Examination: BP(117/47), Pulse(49), Blood Glucose(144) 1A: Level of Consciousness - Requires repeated stimulation to arouse + 2 1B: Ask Month and Age - 1 Question Right + 1 1C: Blink Eyes & Squeeze Hands - Performs Both Tasks + 0 2: Test Horizontal Extraocular Movements - Normal + 0 3: Test Visual Fields - No Visual Loss + 0 4: Test Facial Palsy (Use Grimace if Obtunded) - Normal symmetry + 0 5A: Test Left Arm Motor Drift - No Drift for 10 Seconds + 0 5B: Test Right Arm Motor Drift - No Drift for 10 Seconds + 0 6A: Test Left Leg Motor Drift - No Drift for 5 Seconds + 0 6B: Test Right  Leg Motor Drift - No Drift for 5 Seconds + 0 7: Test Limb Ataxia (FNF/Heel-Shin) - No Ataxia + 0 8: Test Sensation - Normal; No sensory loss + 0 9: Test Language/Aphasia - Normal; No aphasia + 0 10: Test Dysarthria - Normal + 0 11: Test Extinction/Inattention - No  abnormality + 0  NIHSS Score: 3    Patient / Family was informed the Neurology Consult would occur via TeleHealth consult by way of interactive audio and video telecommunications and consented to receiving care in this manner.  Patient is being evaluated for possible acute neurologic impairment and high probability of imminent or life - threatening deterioration.I spent total of 30 minutes providing care to this patient, including time for face to face visit via telemedicine, review of medical records, imaging studies and discussion of findings with providers, the patient and / or family.   Dr Natasha Bence Alyne Martinson   [facility] [phone]  Case [id number]

## 2020-09-21 NOTE — ED Provider Notes (Signed)
Emergency Department Provider Note   I have reviewed the triage vital signs and the nursing notes.   HISTORY  Chief Complaint Altered Mental Status   HPI Edita Karma Greaser Badia is a 80 y.o. female with past medical history reviewed below presents to the emergency department by EMS with altered mental status from skilled nursing facility.  They report patient had a fall with head injury 1 week ago.  She was evaluated and discharged with return precautions discussed per EMS.  She been doing well until 3 PM when staff apparently  less responsive than normal.  She arrives with DNR paperwork.  EMS state that at baseline she can stand but not walk.  She responds yes/no verbally but is not conversational.  Level 5 caveat: AMS- seizure activity  Past Medical History:  Diagnosis Date  . Abnormality of gait 12/15/2013  . Anemia   . Anxiety   . Anxiety disorder   . Bipolar 1 disorder (HCC)   . CKD (chronic kidney disease)   . Depression   . Diabetes (HCC)   . Dysphagia   . Epilepsy (HCC)   . Gait difficulty   . GERD (gastroesophageal reflux disease)   . Hypercholesteremia   . Hyperlipemia   . Hyperlipidemia   . Hypertension   . Immunodeficiency (HCC)   . Major depressive disorder   . Malaise   . Migraine   . NSTEMI, initial episode of care (HCC) 03/18/2014  . Obese   . Sciatica   . Seizures (HCC)   . Tremor   . Vertigo, labyrinthine     Patient Active Problem List   Diagnosis Date Noted  . Cerebrovascular accident (CVA) (HCC) 09/18/2017  . Seizures (HCC) 03/17/2017  . Memory loss 03/17/2017  . Gait abnormality 03/17/2017  . Seizure disorder (HCC) 04/22/2014  . Anxiety and depression 04/22/2014  . Acute respiratory failure with hypoxia (HCC) 04/13/2014  . Severe sepsis with septic shock (HCC) 04/13/2014  . Anemia, iron deficiency 04/13/2014  . Essential hypertension   . Status epilepticus (HCC) 03/18/2014  . Non-ST elevation myocardial infarction (NSTEMI), subsequent  episode of care (HCC) 03/18/2014  . Diabetes mellitus type 2, controlled, without complications (HCC)   . Abnormality of gait 12/15/2013    Past Surgical History:  Procedure Laterality Date  . CATARACT EXTRACTION Bilateral   . CERVICAL SPINE SURGERY    . cholecsystectomy  1977  . CHOLECYSTECTOMY    . COLONOSCOPY  2010  . degenerative disc diease    . NECK SURGERY  1990s  . TONSILLECTOMY      Allergies Tramadol, Aspirin, and Aspirin  Family History  Problem Relation Age of Onset  . Other Mother        unsure of history  . Heart attack Father   . Dementia Mother   . Congestive Heart Failure Mother   . Dementia Father     Social History Social History   Tobacco Use  . Smoking status: Former Smoker    Packs/day: 0.50    Years: 15.00    Pack years: 7.50    Types: Cigarettes    Quit date: 2007    Years since quitting: 15.2  . Smokeless tobacco: Current User    Types: Chew  . Tobacco comment: quit 1977  Vaping Use  . Vaping Use: Never used  Substance Use Topics  . Alcohol use: No    Comment: occasional  . Drug use: No    Review of Systems  Level 5 caveat: AMS  ____________________________________________   PHYSICAL EXAM:  VITAL SIGNS: Vitals:   09/21/20 1849 09/21/20 1900  BP: 126/65 121/87  Pulse: (!) 44 (!) 48  Resp: 17 16  Temp:    SpO2: 100% 100%   Constitutional: Patient with generalized tonic clonic activity. No particular gaze deviation.  Eyes: Conjunctivae are normal.  Head: Atraumatic. Nose: No congestion/rhinnorhea. Mouth/Throat: Mucous membranes are moist. Neck: No stridor.  Cardiovascular: Bradycardia. Good peripheral circulation. Grossly normal heart sounds.   Respiratory: Normal respiratory effort.  No retractions. Lungs CTAB. Gastrointestinal: No distention.  Musculoskeletal: No gross deformities of extremities. Neurologic: Active generalized tonic clonic seizure activity noted shortly after ED arrival.  Skin:  Skin is warm and  dry. Mild skin irritation/breakdown in the groin area. No cellulitis or abscess.   ____________________________________________   LABS (all labs ordered are listed, but only abnormal results are displayed)  Labs Reviewed  CBC WITH DIFFERENTIAL/PLATELET - Abnormal; Notable for the following components:      Result Value   RBC 3.43 (*)    Hemoglobin 10.6 (*)    HCT 33.9 (*)    All other components within normal limits  RESP PANEL BY RT-PCR (FLU A&B, COVID) ARPGX2  ETHANOL  PHENYTOIN LEVEL, TOTAL  COMPREHENSIVE METABOLIC PANEL  URINALYSIS, ROUTINE W REFLEX MICROSCOPIC   ____________________________________________  EKG   EKG Interpretation  Date/Time:  Thursday September 21 2020 17:18:13 EDT Ventricular Rate:  49 PR Interval:  167 QRS Duration: 95 QT Interval:  461 QTC Calculation: 417 R Axis:   54 Text Interpretation: Sinus bradycardia Baseline wander in lead(s) V3 Confirmed by Alona Bene [phone]) on 09/21/2020 5:19:58 PM       ____________________________________________  RADIOLOGY  CT Head Wo Contrast  Result Date: 09/21/2020 CLINICAL DATA:  Mental status change.  Unknown cause. EXAM: CT HEAD WITHOUT CONTRAST TECHNIQUE: Contiguous axial images were obtained from the base of the skull through the vertex without intravenous contrast. COMPARISON:  MR head 03/25/2014 FINDINGS: Brain: Cerebral ventricle sizes are concordant with the degree of cerebral volume loss. Patchy and confluent areas of decreased attenuation are noted throughout the deep and periventricular white matter of the cerebral hemispheres bilaterally, compatible with chronic microvascular ischemic disease. No evidence of large-territorial acute infarction. No parenchymal hemorrhage. No mass lesion. No extra-axial collection. No mass effect or midline shift. No hydrocephalus. Basilar cisterns are patent. Vascular: No hyperdense vessel. Skull: No acute fracture or focal lesion. Sinuses/Orbits: Paranasal sinuses and  mastoid air cells are clear. The orbits are unremarkable. Other: None. IMPRESSION: No acute intracranial abnormality. Electronically Signed   By: Tish Frederickson M.D.   On: 09/21/2020 18:15    ____________________________________________   PROCEDURES  Procedure(s) performed:   .Critical Care Performed by: Maia Plan, MD Authorized by: Maia Plan, MD   Critical care provider statement:    Critical care time (minutes):  45   Critical care time was exclusive of:  Separately billable procedures and treating other patients and teaching time   Critical care was necessary to treat or prevent imminent or life-threatening deterioration of the following conditions:  CNS failure or compromise   Critical care was time spent personally by me on the following activities:  Discussions with consultants, evaluation of patient's response to treatment, examination of patient, ordering and performing treatments and interventions, ordering and review of laboratory studies, ordering and review of radiographic studies, pulse oximetry, re-evaluation of patient's condition, obtaining history from patient or surrogate, review of old charts, blood draw for specimens and development of  treatment plan with patient or surrogate   I assumed direction of critical care for this patient from another provider in my specialty: no     Care discussed with: admitting provider       ____________________________________________   INITIAL IMPRESSION / ASSESSMENT AND PLAN / ED COURSE  Pertinent labs & imaging results that were available during my care of the patient were reviewed by me and considered in my medical decision making (see chart for details).  Patient arrives to the emergency department with report of altered mental status from the nursing facility.  Shortly after arrival on my evaluation it appears that the patient is having some generalized tonic-clonic seizure activity.  I see an old head injury but  nothing which appears new.  Ordered 2 mg of IV Ativan along with gentle IV fluid bolus.  With recent head trauma plan for CT imaging of the head without contrast.  Review of paperwork at bedside shows the patient is on Dilantin, Vimpat, and Lamictal.   05:15 PM  Patient responding to Ativan. Remains somnolent but no longer with generalized tonic clonic activity.   05:45 PM  Patient is more awake and alert.  She is able to tell me her name and that she is not having pain.   06:30 PM  Labs pending. CT head without acute findings. Patient responding verbally but remains sluggish.   07:20 PM  Patient reassessed.  She is now awake and alert.  She is looking around the room and more conversational.  Her Dilantin level is within normal limits.  No leukocytosis to suspect infection or developing sepsis.  CT imaging reviewed and interpreted as above.  Waiting on the complete metabolic panel and UA. Updated the patient's son by phone. He does not recall any recent breakthrough seizures.  ____________________________________________  FINAL CLINICAL IMPRESSION(S) / ED DIAGNOSES  Final diagnoses:  None     MEDICATIONS GIVEN DURING THIS VISIT:  Medications  sodium chloride 0.9 % bolus 500 mL (0 mLs Intravenous Stopped 09/21/20 1830)  LORazepam (ATIVAN) injection 2 mg (2 mg Intravenous Given 09/21/20 1715)     NEW OUTPATIENT MEDICATIONS STARTED DURING THIS VISIT:  New Prescriptions   No medications on file    Note:  This document was prepared using Dragon voice recognition software and may include unintentional dictation errors.  Alona Bene, MD, [facility] Emergency Medicine    Jeshua Ransford, Arlyss Repress, MD 09/24/20 2008

## 2020-09-21 NOTE — H&P (Signed)
History and Physical  Brooke Gross ACZ:[id number] DOB: 1940-06-27 DOA: 09/21/2020  Referring physician: Maia Plan, MD PCP: Galvin Proffer, MD  Patient coming from: [facility]  Chief Complaint: Altered mental status   HPI: [name] is a 80 y.o. female with medical history significant for seizures, hypertension, T2DM, hyperlipidemia, GERD and prior stroke who presents to the emergency department from [facility] due to altered mental status.  Patient was unable to provide a history at this time due to altered mental status, history was obtained from ED physician and ED medical record.  Per report, Nursing facility reported that patient sustained a fall with head injury about 1 week ago, she was evaluated at the ER and discharged with return precautions, patient has been functioning at her baseline mentation was noted to be less responsive at 3 PM (possibly in postictal state, EMS was activated and patient was taken to the ED for further evaluation and management.  Patient was reported to be able to stand but was unable to ambulate at baseline and that she responds yes/no verbally, but not conversational.  ED Course:  In the emergency department, initially hypothermic with a temperature of 97.69F, this improved to 97.39F, she was bradycardic with HR in high 40s to high 50s.  BP 133/51 and O2 sat 100% on room air.  Work-up in the ED showed normocytic anemia, hypokalemia, BUN/creatinine 42/1.39 (most recent labs for comparison was 3 years ago with creatinine of 0.99). Chest x-ray showed no active disease.  CT head without contrast showed no acute intracranial abnormality.  Patient had another seizure in the ED, IV Ativan 2 mg x 1 was given, IV hydration of 500 mL was given.  Neurology was consulted and recommended MRI of brain with and without contrast.  Hospitalist was asked to admit him for further evaluation and management.  Review of Systems: This cannot be obtained at  this time due to patient's current condition  Past Medical History:  Diagnosis Date  . Abnormality of gait 12/15/2013  . Anemia   . Anxiety   . Anxiety disorder   . Bipolar 1 disorder (HCC)   . CKD (chronic kidney disease)   . Depression   . Diabetes (HCC)   . Dysphagia   . Epilepsy (HCC)   . Gait difficulty   . GERD (gastroesophageal reflux disease)   . Hypercholesteremia   . Hyperlipemia   . Hyperlipidemia   . Hypertension   . Immunodeficiency (HCC)   . Major depressive disorder   . Malaise   . Migraine   . NSTEMI, initial episode of care (HCC) 03/18/2014  . Obese   . Sciatica   . Seizures (HCC)   . Tremor   . Vertigo, labyrinthine    Past Surgical History:  Procedure Laterality Date  . CATARACT EXTRACTION Bilateral   . CERVICAL SPINE SURGERY    . cholecsystectomy  1977  . CHOLECYSTECTOMY    . COLONOSCOPY  2010  . degenerative disc diease    . NECK SURGERY  1990s  . TONSILLECTOMY      Social History:  reports that she quit smoking about 15 years ago. Her smoking use included cigarettes. She has a 7.50 pack-year smoking history. Her smokeless tobacco use includes chew. She reports that she does not drink alcohol and does not use drugs.   Allergies  Allergen Reactions  . Tramadol     Other reaction(s): Seizures  . Aspirin Hives  . Aspirin Other (See Comments)  Pt reports hives with any dose above 81mg .    Family History  Problem Relation Age of Onset  . Other Mother        unsure of history  . Heart attack Father   . Dementia Mother   . Congestive Heart Failure Mother   . Dementia Father      Prior to Admission medications   Medication Sig Start Date End Date Taking? Authorizing Provider  acetaminophen (TYLENOL) 325 MG tablet Take 650 mg by mouth every 6 (six) hours as needed.    Yes [provider]  aspirin EC 81 MG tablet Take 81 mg by mouth daily. Swallow whole.   Yes [provider]  lamoTRIgine (LAMICTAL) 150 MG tablet Take 1  tablet (150 mg total) by mouth 2 (two) times daily. Patient taking differently: Take 450 mg by mouth 2 (two) times daily. 09/18/17  Yes 11/18/17, MD  lisinopril (PRINIVIL,ZESTRIL) 10 MG tablet Take 10 mg by mouth daily.   Yes [provider]  loperamide (IMODIUM A-D) 2 MG tablet Take 2 mg by mouth 4 (four) times daily as needed for diarrhea or loose stools.   Yes [provider]  LORazepam (ATIVAN) 1 MG tablet Take 1 mg by mouth 3 (three) times daily. 06/20/20  Yes [provider]  LORazepam (ATIVAN) 2 MG/ML concentrated solution Take 1 mg by mouth as needed for anxiety (Inject 1mg  IM as needed for seizure.  Repeat x 1 if needed.  Send to ER for any further seizures.).   Yes [provider]  Melatonin 3 MG TABS Take by mouth.   Yes [provider]  metFORMIN (GLUCOPHAGE) 500 MG tablet Take 500 mg by mouth daily with breakfast.    Yes [provider]  mirtazapine (REMERON) 7.5 MG tablet Take 7.5 mg by mouth at bedtime. 07/02/20  Yes [provider]  NOVOLOG FLEXPEN 100 UNIT/ML FlexPen Inject into the skin See admin instructions. Per sliding scale 05/20/20  Yes [provider]  OLANZapine (ZYPREXA) 10 MG tablet Take 10 mg by mouth at bedtime.   Yes [provider]  phenytoin (DILANTIN) 50 MG tablet Chew 150 mg by mouth at bedtime.   Yes [provider]  Vitamin D, Ergocalciferol, (DRISDOL) 50000 units CAPS capsule Take 50,000 Units by mouth every 30 (thirty) days.    Yes [provider]  zinc gluconate 50 MG tablet Take 50 mg by mouth daily.   Yes [provider]  alum & mag hydroxide-simeth (MAALOX/MYLANTA) 200-200-20 MG/5ML suspension Take 10 mLs by mouth every 2 (two) hours as needed for indigestion or heartburn. Patient not taking: Reported on 09/21/2020    [provider]  atorvastatin (LIPITOR) 40 MG tablet Take 40 mg by mouth daily. Patient not taking: Reported on 09/21/2020    [provider]  clopidogrel (PLAVIX) 75 MG tablet Take 1 tablet (75 mg total) by mouth daily. Patient not taking: Reported on 09/21/2020 04/07/14   11/21/2020, MD  ferrous sulfate 325 (65 FE) MG tablet Take 325 mg by mouth daily with breakfast. Patient not taking: Reported on 09/21/2020    [provider]  insulin glargine (LANTUS) 100 UNIT/ML injection Inject 10 Units into the skin at bedtime. Patient not taking: Reported on 09/21/2020    [provider]  Lacosamide 100 MG TABS Take 1 tablet (100 mg total) by mouth 2 (two) times daily. Patient not taking: Reported on 09/21/2020 09/18/17   11/21/2020, MD  Magnesium Oxide  500 MG TABS Take 500 mg by mouth 2 (two) times daily. Patient not taking: Reported on 09/21/2020    [provider]  mirtazapine (REMERON) 30 MG tablet Take 30 mg by mouth at bedtime. Patient not taking: Reported on 09/21/2020    [provider]  nystatin (MYCOSTATIN/NYSTOP) powder Apply topically. Patient not taking: Reported on 09/21/2020    [provider]  pantoprazole (PROTONIX) 20 MG tablet Take 20 mg by mouth daily.    [provider]  PARoxetine (PAXIL) 40 MG tablet Take 40 mg by mouth daily.    [provider]  phenytoin (DILANTIN) 100 MG ER capsule Take by mouth 2 (two) times daily. Patient not taking: Reported on 09/21/2020    [provider]  polyethylene glycol (MIRALAX / GLYCOLAX) packet Take 17 g by mouth at bedtime.    [provider]  QUEtiapine (SEROQUEL) 50 MG tablet Take by mouth. 07/08/20   [provider]  sertraline (ZOLOFT) 100 MG tablet Take 1 tablet by mouth daily. 06/30/20   [provider]    Physical Exam: BP (!) 143/52 (BP Location: Left Arm)   Pulse (!) 59   Temp 97.6 F (36.4 C) (Oral)   Resp 20   Ht 5' (1.524 m)   Wt 81.6 kg   SpO2 100%   BMI 35.15 kg/m   . General: 80 y.o. year-old female ill appearing but in no acute distress.  Alert and  oriented x 1. . HEENT: Suture of laceration noted in frontal area.  NCAT, EOMI . Neck: Supple, trachea medial . Cardiovascular: Regular rate and rhythm with no rubs or gallops.  No thyromegaly or JVD noted.  No lower extremity edema. 2/4 pulses in all 4 extremities. Marland Kitchen Respiratory: Clear to auscultation with no wheezes or rales. Good inspiratory effort. . Abdomen: Soft nontender nondistended with normal bowel sounds x4 quadrants. . Muskuloskeletal: No cyanosis, clubbing or edema noted bilaterally . Neuro: Confused and disoriented.  Sensation intact.   . Skin: No ulcerative lesions noted or rashes . Psychiatry: Mood is appropriate for condition and setting          Labs on Admission:  Basic Metabolic Panel: Recent Labs  Lab 09/21/20 1814 09/21/20 2111  NA 138  --   K 5.3*  --   CL 105  --   CO2 23  --   GLUCOSE 144*  --   BUN 42*  --   CREATININE 1.39*  --   CALCIUM 9.0  --   MG  --  2.2  PHOS 4.1  --    Liver Function Tests: Recent Labs  Lab 09/21/20 1814  AST 18  ALT 14  ALKPHOS 95  BILITOT 0.3  PROT 6.9  ALBUMIN 3.7   No results for input(s): LIPASE, AMYLASE in the last 168 hours. No results for input(s): AMMONIA in the last 168 hours. CBC: Recent Labs  Lab 09/21/20 1814  WBC 6.8  NEUTROABS 4.2  HGB 10.6*  HCT 33.9*  MCV 98.8  PLT 291   Cardiac Enzymes: No results for input(s): CKTOTAL, CKMB, CKMBINDEX, TROPONINI in the last 168 hours.  BNP (last 3 results) No results for input(s): BNP in the last 8760 hours.  ProBNP (last 3 results) No results for input(s): PROBNP in the last 8760 hours.  CBG: Recent Labs  Lab 09/21/20 2216  GLUCAP 108*    Radiological Exams on Admission: CT Head Wo Contrast  Result Date: 09/21/2020 CLINICAL DATA:  Mental status change.  Unknown  cause. EXAM: CT HEAD WITHOUT CONTRAST TECHNIQUE: Contiguous axial images were obtained from the base of the skull through the vertex without intravenous contrast. COMPARISON:  MR head  03/25/2014 FINDINGS: Brain: Cerebral ventricle sizes are concordant with the degree of cerebral volume loss. Patchy and confluent areas of decreased attenuation are noted throughout the deep and periventricular white matter of the cerebral hemispheres bilaterally, compatible with chronic microvascular ischemic disease. No evidence of large-territorial acute infarction. No parenchymal hemorrhage. No mass lesion. No extra-axial collection. No mass effect or midline shift. No hydrocephalus. Basilar cisterns are patent. Vascular: No hyperdense vessel. Skull: No acute fracture or focal lesion. Sinuses/Orbits: Paranasal sinuses and mastoid air cells are clear. The orbits are unremarkable. Other: None. IMPRESSION: No acute intracranial abnormality. Electronically Signed   By: Tish FredericksonMorgane  Naveau M.D.   On: 09/21/2020 18:15   DG Chest Portable 1 View  Result Date: 09/21/2020 CLINICAL DATA:  Hypothermia EXAM: PORTABLE CHEST 1 VIEW COMPARISON:  None. FINDINGS: Heart and mediastinal contours are within normal limits. No focal opacities or effusions. No acute bony abnormality. IMPRESSION: No active disease. Electronically Signed   By: Charlett NoseKevin  Dover M.D.   On: 09/21/2020 20:59    EKG: I independently viewed the EKG done and my findings are as followed: Sinus bradycardia at a rate of 49 bpm    Assessment/Gross Present on Admission: . Essential hypertension  Principal Problem:   Seizures (HCC) Active Problems:   Essential hypertension   Gait abnormality   Bradycardia   Hypothermia   Hyperglycemia due to diabetes mellitus (HCC)   AKI (acute kidney injury) (HCC)   Hyperlipidemia   GERD (gastroesophageal reflux disease)   Altered mental status  Altered mental status possibly secondary to acute breakthrough seizures r/o CVA CT of head showed no acute intracranial emergency MRI of brain w/wo contrast will be done in the morning Continue Lamictal, Dilantin, Vimpat per teleneurology recommendation IV Ativan 1 mg  for seizures > 5 minutes will be provided as needed Continue fall precaution, seizure precaution and neurochecks Bedside swallow eval prior to current intake Neurologist will be consulted in the morning  Sinus bradycardia Stable, continue to monitor  Hypothermia-resolved  Hyperkalemia K+ 5.3; IV hydration was provided Continue to monitor K+ level  Hyperglycemia secondary to T2DM Continue ISS and hypoglycemic protocol Metformin will be held at this time  Acute kidney injury BUN/creatinine 42/1.39 (most recent labs for comparison was 3 years ago with creatinine of 0.99). Renally adjust medications, avoid nephrotoxic agents/dehydration/hypotension Continue IV hydration  Essential hypertension Resume home BP meds after MRI of brain to ensure no intracranial hemorrhage  Hyperlipidemia Patient was not taking Lipitor at home  GERD Continue Protonix  Gait abnormality Continue PT/OT eval and treat  DVT prophylaxis: Lovenox  Code Status: Full code  Family Communication: None at bedside  Disposition Gross:  Patient is from:                         SNF Anticipated DC to:                   SNF  anticipated DC date:               1 day Anticipated DC barriers:           Patient requires further work-up and neurology consult due to breakthrough seizures   Consults called: Neurology  Admission status: Observation    Frankey Shownladapo Kalleigh Harbor MD [facility]  09/22/2020, 12:46 AM

## 2020-09-21 NOTE — ED Notes (Signed)
Tele-neuro evaluating the patient at this time.

## 2020-09-21 NOTE — ED Triage Notes (Signed)
Brought in by [facility] from [facility]. Pt fell last Thursday, seen 4/2. Staff found in bed around 1500 not responding much, called EMS 1620, pt grimaces at times. CBG 261. Pt normally can stand and is normally responds verbally with yes/no responses.

## 2020-09-22 ENCOUNTER — Observation Stay (HOSPITAL_COMMUNITY): Payer: Medicare Other

## 2020-09-22 DIAGNOSIS — E1165 Type 2 diabetes mellitus with hyperglycemia: Secondary | ICD-10-CM

## 2020-09-22 DIAGNOSIS — T68XXXA Hypothermia, initial encounter: Secondary | ICD-10-CM

## 2020-09-22 DIAGNOSIS — G40919 Epilepsy, unspecified, intractable, without status epilepticus: Secondary | ICD-10-CM | POA: Diagnosis not present

## 2020-09-22 DIAGNOSIS — R569 Unspecified convulsions: Secondary | ICD-10-CM | POA: Diagnosis not present

## 2020-09-22 DIAGNOSIS — K219 Gastro-esophageal reflux disease without esophagitis: Secondary | ICD-10-CM

## 2020-09-22 DIAGNOSIS — E785 Hyperlipidemia, unspecified: Secondary | ICD-10-CM

## 2020-09-22 DIAGNOSIS — N179 Acute kidney failure, unspecified: Secondary | ICD-10-CM

## 2020-09-22 DIAGNOSIS — R001 Bradycardia, unspecified: Secondary | ICD-10-CM

## 2020-09-22 DIAGNOSIS — R4182 Altered mental status, unspecified: Secondary | ICD-10-CM

## 2020-09-22 LAB — GLUCOSE, CAPILLARY
Glucose-Capillary: 150 mg/dL — ABNORMAL HIGH (ref 70–99)
Glucose-Capillary: 113 mg/dL — ABNORMAL HIGH (ref 70–99)
Glucose-Capillary: 124 mg/dL — ABNORMAL HIGH (ref 70–99)

## 2020-09-22 LAB — COMPREHENSIVE METABOLIC PANEL
ALT: 12 U/L (ref 0–44)
AST: 17 U/L (ref 15–41)
Albumin: 3.3 g/dL — ABNORMAL LOW (ref 3.5–5.0)
Alkaline Phosphatase: 95 U/L (ref 38–126)
Anion gap: 9 (ref 5–15)
BUN: 32 mg/dL — ABNORMAL HIGH (ref 8–23)
CO2: 26 mmol/L (ref 22–32)
Calcium: 9.2 mg/dL (ref 8.9–10.3)
Chloride: 108 mmol/L (ref 98–111)
Creatinine, Ser: 1.03 mg/dL — ABNORMAL HIGH (ref 0.44–1.00)
GFR, Estimated: 56 mL/min — ABNORMAL LOW (ref >60)
Glucose, Bld: 102 mg/dL — ABNORMAL HIGH (ref 70–99)
Potassium: 4.5 mmol/L (ref 3.5–5.1)
Sodium: 143 mmol/L (ref 135–145)
Total Bilirubin: 0.3 mg/dL (ref 0.3–1.2)
Total Protein: 6.3 g/dL — ABNORMAL LOW (ref 6.5–8.1)

## 2020-09-22 LAB — CBC
HCT: 31.9 % — ABNORMAL LOW (ref 36.0–46.0)
Hemoglobin: 10.2 g/dL — ABNORMAL LOW (ref 12.0–15.0)
MCH: 31.4 pg (ref 26.0–34.0)
MCHC: 32 g/dL (ref 30.0–36.0)
MCV: 98.2 fL (ref 80.0–100.0)
Platelets: 283 10*3/uL (ref 150–400)
RBC: 3.25 MIL/uL — ABNORMAL LOW (ref 3.87–5.11)
RDW: 12.9 % (ref 11.5–15.5)
WBC: 6.8 10*3/uL (ref 4.0–10.5)
nRBC: 0 % (ref 0.0–0.2)

## 2020-09-22 LAB — APTT: aPTT: 31 seconds (ref 24–36)

## 2020-09-22 LAB — PROTIME-INR
INR: 1 (ref 0.8–1.2)
Prothrombin Time: 12.8 seconds (ref 11.4–15.2)

## 2020-09-22 LAB — MAGNESIUM: Magnesium: 1.9 mg/dL (ref 1.7–2.4)

## 2020-09-22 LAB — HEMOGLOBIN A1C
Hgb A1c MFr Bld: 7.6 % — ABNORMAL HIGH (ref 4.8–5.6)
Mean Plasma Glucose: 171.42 mg/dL

## 2020-09-22 LAB — MRSA PCR SCREENING: MRSA by PCR: NEGATIVE

## 2020-09-22 MED ORDER — GADOBUTROL 1 MMOL/ML IV SOLN
7.5000 mL | Freq: Once | INTRAVENOUS | Status: AC | PRN
  Administered 2020-09-22: 7.5 mL via INTRAVENOUS

## 2020-09-22 MED ORDER — INSULIN ASPART 100 UNIT/ML ~~LOC~~ SOLN
0.0000 [IU] | SUBCUTANEOUS | Status: DC
  Administered 2020-09-22: 1 [IU] via SUBCUTANEOUS

## 2020-09-22 MED ORDER — ASPIRIN EC 81 MG PO TBEC: 81.0000 mg | DELAYED_RELEASE_TABLET | Freq: Every day | ORAL | 2 refills | Status: DC

## 2020-09-22 MED ORDER — LACOSAMIDE 150 MG PO TABS: 150.0000 mg | ORAL_TABLET | Freq: Two times a day (BID) | ORAL | 4 refills | Status: DC

## 2020-09-22 MED ORDER — SODIUM CHLORIDE 0.9 % IV SOLN: Freq: Once | INTRAVENOUS | Status: AC

## 2020-09-22 MED ORDER — PHENYTOIN 50 MG PO CHEW
150.0000 mg | CHEWABLE_TABLET | Freq: Two times a day (BID) | ORAL | Status: DC
  Administered 2020-09-22: 150 mg via ORAL
  Filled 2020-09-22: qty 3

## 2020-09-22 MED ORDER — LAMOTRIGINE 200 MG PO TABS: 400.0000 mg | ORAL_TABLET | Freq: Two times a day (BID) | ORAL | 3 refills | Status: DC

## 2020-09-22 MED ORDER — PANTOPRAZOLE SODIUM 40 MG IV SOLR
40.0000 mg | INTRAVENOUS | Status: DC
  Administered 2020-09-22: 40 mg via INTRAVENOUS
  Filled 2020-09-22: qty 40

## 2020-09-22 MED ORDER — LACOSAMIDE 50 MG PO TABS
100.0000 mg | ORAL_TABLET | Freq: Two times a day (BID) | ORAL | Status: DC
  Administered 2020-09-22: 100 mg via ORAL
  Filled 2020-09-22: qty 2

## 2020-09-22 MED ORDER — LORAZEPAM 2 MG/ML IJ SOLN
0.5000 mg | Freq: Once | INTRAMUSCULAR | Status: AC
  Administered 2020-09-22: 0.5 mg via INTRAVENOUS
  Filled 2020-09-22: qty 1

## 2020-09-22 MED ORDER — LAMOTRIGINE 25 MG PO TABS
450.0000 mg | ORAL_TABLET | Freq: Two times a day (BID) | ORAL | Status: DC
  Administered 2020-09-22: 450 mg via ORAL
  Filled 2020-09-22: qty 2

## 2020-09-22 NOTE — Plan of Care (Signed)
  Problem: Acute Rehab OT Goals (only OT should resolve) Goal: Pt. Will Perform Grooming Flowsheets (Taken 09/22/2020 1221) Pt Will Perform Grooming:  standing  with min assist Goal: Pt. Will Perform Upper Body Dressing Flowsheets (Taken 09/22/2020 1221) Pt Will Perform Upper Body Dressing:  with modified independence  sitting Goal: Pt. Will Perform Lower Body Dressing Flowsheets (Taken 09/22/2020 1221) Pt Will Perform Lower Body Dressing:  with min assist  sitting/lateral leans  with adaptive equipment Goal: Pt. Will Transfer To Toilet Flowsheets (Taken 09/22/2020 1221) Pt Will Transfer to Toilet:  with supervision  stand pivot transfer  with min guard assist  grab bars Goal: Pt/Caregiver Will Perform Home Exercise Program Flowsheets (Taken 09/22/2020 1221) Pt/caregiver will Perform Home Exercise Program:  Increased strength  Both right and left upper extremity  Lynley Killilea OT, MOT

## 2020-09-22 NOTE — Plan of Care (Signed)
  Problem: Acute Rehab PT Goals(only PT should resolve) Goal: Patient Will Transfer Sit To/From Stand Outcome: Progressing Flowsheets (Taken 09/22/2020 1127) Patient will transfer sit to/from stand: with minimal assist Goal: Pt Will Transfer Bed To Chair/Chair To Bed Outcome: Progressing Flowsheets (Taken 09/22/2020 1127) Pt will Transfer Bed to Chair/Chair to Bed: with min assist Goal: Pt Will Ambulate Outcome: Progressing Flowsheets (Taken 09/22/2020 1127) Pt will Ambulate:  10 feet  with rolling walker  with moderate assist Goal: Pt/caregiver will Perform Home Exercise Program Outcome: Progressing Flowsheets (Taken 09/22/2020 1127) Pt/caregiver will Perform Home Exercise Program:  For increased strengthening  For improved balance  With Supervision, verbal cues required/provided   11:28 AM, 09/22/20 Wyman Songster PT, DPT Physical Therapist at [facility]

## 2020-09-22 NOTE — Evaluation (Signed)
Occupational Therapy Evaluation Patient Details Name: Brooke Gross MRN: [id number] DOB: [birth year] Today's Date: 09/22/2020    History of Present Illness Brooke Gross is a 80 y.o. female with medical history significant for seizures, hypertension, T2DM, hyperlipidemia, GERD and prior stroke who presents to the emergency department from [facility] due to altered mental status.  Patient was unable to provide a history at this time due to altered mental status, history was obtained from ED physician and ED medical record.  Per report, Nursing facility reported that patient sustained a fall with head injury about 1 week ago, she was evaluated at the ER and discharged with return precautions, patient has been functioning at her baseline mentation was noted to be less responsive at 3 PM (possibly in postictal state, EMS was activated and patient was taken to the ED for further evaluation and management.  Patient was reported to be able to stand but was unable to ambulate at baseline and that she responds yes/no verbally, but not conversational.   Clinical Impression   Pt agreeable to OT/PT co-evaluation. Pt lacked orientation to time and president but was oriented to place. Pt required moderate support for bed mobility and min to mod A for stand pivot transfer from EOB to chair. Pt required total assist to don sock at bed level and min A to don gown with verbal cues for directions while seated in chair. Pt will benefit from continued Ot in the hospital and recommended venue below to increase strength, balance, and endurance for safe ADL's.     Follow Up Recommendations  SNF    Equipment Recommendations  None recommended by OT           Precautions / Restrictions Precautions Precautions: Fall Restrictions Weight Bearing Restrictions: No      Mobility Bed Mobility Overal bed mobility: Needs Assistance Bed Mobility: Supine to Sit     Supine to sit: Mod assist      General bed mobility comments: Patient requires mod assist to upright trunk and for LE movement    Transfers Overall transfer level: Needs assistance Equipment used: Rolling walker (2 wheeled) Transfers: Sit to/from [employer] Sit to Stand: Min assist;Mod assist Stand pivot transfers: Min assist;Mod assist       General transfer comment: Patient requires mid/mod assist and RW to transfer to standing, cueing for RW use    Balance Overall balance assessment: Needs assistance Sitting-balance support: Bilateral upper extremity supported Sitting balance-Leahy Scale: Fair Sitting balance - Comments: initially requires assist but able to maintain sitting after several minutes with bilateral UE support   Standing balance support: Bilateral upper extremity supported Standing balance-Leahy Scale: Poor Standing balance comment: with RW                           ADL either performed or assessed with clinical judgement   ADL Overall ADL's : Needs assistance/impaired                 Upper Body Dressing : Supervision/safety;Minimal assistance;Sitting Upper Body Dressing Details (indicate cue type and reason): Verbal cues and min A needed to thread B UE through gown. Lower Body Dressing: Total assistance;Bed level Lower Body Dressing Details (indicate cue type and reason): total assist at bed level to don socks Toilet Transfer: Moderate assistance;Stand-pivot;Minimal assistance Toilet Transfer Details (indicate cue type and reason): simulated via EOB to chair transfer with RW  Vision Baseline Vision/History: No visual deficits (non reported by patient)                  Pertinent Vitals/Pain Pain Assessment: Faces Faces Pain Scale: Hurts little more Pain Location: upper back; during initial supine to sit bed mobility Pain Descriptors / Indicators: Grimacing;Moaning Pain Intervention(s): Monitored during session;Limited  activity within patient's tolerance          Extremity/Trunk Assessment Upper Extremity Assessment Upper Extremity Assessment: Generalized weakness   Lower Extremity Assessment Lower Extremity Assessment: Defer to PT evaluation   Cervical / Trunk Assessment Cervical / Trunk Assessment: Kyphotic (mild kyphotic)   Communication Communication Communication: No difficulties   Cognition Arousal/Alertness: Awake/alert Behavior During Therapy: WFL for tasks assessed/performed Overall Cognitive Status: Impaired/Different from baseline Area of Impairment: Orientation                 Orientation Level: Time (reported year was 2018)             General Comments: initially lethargic and appears confused but once alert, able to provide more info and hold a conversation   General Comments                  Home Living Family/patient expects to be discharged to:: Skilled nursing facility                                 Additional Comments: initially lethargic and appears confused but once alert, able to provide more info      Prior Functioning/Environment Level of Independence: Needs assistance  Gait / Transfers Assistance Needed: Patient states able to ambulate ADL's / Homemaking Assistance Needed: Pt reports she does not get assist for bathing and dressing. (Pt lacked orientation to time. Unable to state the president at this time. Infor provided may not be fully accurate.)            OT Problem List: Decreased strength;Decreased activity tolerance;Impaired balance (sitting and/or standing);Decreased safety awareness;Decreased cognition;Obesity      OT Treatment/Interventions: Self-care/ADL training;Therapeutic exercise;Therapeutic activities;Cognitive remediation/compensation;Patient/family education;Balance training    OT Goals(Current goals can be found in the care plan section) Acute Rehab OT Goals Patient Stated Goal: return home OT Goal  Formulation: With patient Time For Goal Achievement: 10/06/20 Potential to Achieve Goals: Fair  OT Frequency: Min 2X/week               Co-evaluation PT/OT/SLP Co-Evaluation/Treatment: Yes Reason for Co-Treatment: To address functional/ADL transfers PT goals addressed during session: Mobility/safety with mobility;Balance;Proper use of DME;Strengthening/ROM OT goals addressed during session: ADL's and self-care;Strengthening/ROM      AM-PAC OT "6 Clicks" Daily Activity     Outcome Measure Help from another person eating meals?: A Little Help from another person taking care of personal grooming?: A Little Help from another person toileting, which includes using toliet, bedpan, or urinal?: A Lot Help from another person bathing (including washing, rinsing, drying)?: A Lot Help from another person to put on and taking off regular upper body clothing?: A Little Help from another person to put on and taking off regular lower body clothing?: A Lot 6 Click Score: 15   End of Session Equipment Utilized During Treatment: Rolling walker  Activity Tolerance: Patient tolerated treatment well Patient left: in chair;with call bell/phone within reach;with chair alarm set  OT Visit Diagnosis: Muscle weakness (generalized) (M62.81);Repeated falls (R29.6);Other abnormalities of gait and mobility (R26.89);History of falling (  Z91.81)                Time: 4825-0037 OT Time Calculation (min): 22 min Charges:  OT General Charges $OT Visit: 1 Visit OT Evaluation $OT Eval Low Complexity: 1 Low  Rebeca Valdivia OT, MOT   Danie Chandler 09/22/2020, 12:18 PM

## 2020-09-22 NOTE — Discharge Summary (Addendum)
Brooke Gross, is a 80 y.o. female  DOB [birth year]  MRN [id number].  Admission date:  09/21/2020  Admitting Physician  Frankey Shown, DO  Discharge Date:  09/22/2020   Primary MD  Galvin Proffer, MD  Recommendations for primary care physician for things to follow:   1)Please follow-up with Neurologist Dr. Beryle Beams-- Phone: [phone], Address: [address], [place], [place] [address] in 2 to 4 weeks for recheck and reevaluation--due to seizures - please call to make appointment with him  2) Please increase lacosamide to 150 mg twice daily from 100 mg twice daily  3)Please increase Lamictal to 400 mg twice daily from 300 mg twice daily  4) please Observe fall and seizure precautions   Admission Diagnosis  Seizures (HCC) [R56.9] Breakthrough seizure (HCC) [G40.919]   Discharge Diagnosis  Seizures (HCC) [R56.9] Breakthrough seizure (HCC) [G40.919]    Principal Problem:   Seizures (HCC) Active Problems:   Essential hypertension   Gait abnormality   Bradycardia   Hypothermia   Hyperglycemia due to diabetes mellitus (HCC)   AKI (acute kidney injury) (HCC)   Hyperlipidemia   GERD (gastroesophageal reflux disease)   Altered mental status      Past Medical History:  Diagnosis Date  . Abnormality of gait 12/15/2013  . Anemia   . Anxiety   . Anxiety disorder   . Bipolar 1 disorder (HCC)   . CKD (chronic kidney disease)   . Depression   . Diabetes (HCC)   . Dysphagia   . Epilepsy (HCC)   . Gait difficulty   . GERD (gastroesophageal reflux disease)   . Hypercholesteremia   . Hyperlipemia   . Hyperlipidemia   . Hypertension   . Immunodeficiency (HCC)   . Major depressive disorder   . Malaise   . Migraine   . NSTEMI, initial episode of care (HCC) 03/18/2014  . Obese   . Sciatica   . Seizures (HCC)   . Tremor   . Vertigo, labyrinthine     Past Surgical  History:  Procedure Laterality Date  . CATARACT EXTRACTION Bilateral   . CERVICAL SPINE SURGERY    . cholecsystectomy  1977  . CHOLECYSTECTOMY    . COLONOSCOPY  2010  . degenerative disc diease    . NECK SURGERY  1990s  . TONSILLECTOMY       HPI  from the history and physical done on the day of admission:    Chief Complaint: Altered mental status   HPI: Brooke Gross is a 80 y.o. female with medical history significant for seizures, hypertension, T2DM, hyperlipidemia, GERD and prior stroke who presents to the emergency department from [facility] due to altered mental status.  Patient was unable to provide a history at this time due to altered mental status, history was obtained from ED physician and ED medical record.  Per report, Nursing facility reported that patient sustained a fall with head injury about 1 week ago, she was evaluated at the ER and discharged with  return precautions, patient has been functioning at her baseline mentation was noted to be less responsive at 3 PM (possibly in postictal state, EMS was activated and patient was taken to the ED for further evaluation and management.  Patient was reported to be able to stand but was unable to ambulate at baseline and that she responds yes/no verbally, but not conversational.  ED Course:  In the emergency department, initially hypothermic with a temperature of 97.346F, this improved to 97.46F, she was bradycardic with HR in high 40s to high 50s.  BP 133/51 and O2 sat 100% on room air.  Work-up in the ED showed normocytic anemia, hypokalemia, BUN/creatinine 42/1.39 (most recent labs for comparison was 3 years ago with creatinine of 0.99). Chest x-ray showed no active disease.  CT head without contrast showed no acute intracranial abnormality.  Patient had another seizure in the ED, IV Ativan 2 mg x 1 was given, IV hydration of 500 mL was given.  Neurology was consulted and recommended MRI of brain with and without contrast.   Hospitalist was asked to admit him for further evaluation and management.    Hospital Course:     1) acute metabolic encephalopathy secondary to breakthrough seizures--- spoke with patient's son, who stated that the patient has had seizures since around 2015 -Extensive neuro work-up was mostly negative previously -Dilantin level is therapeutic - increase lacosamide to 150 mg twice daily from 100 mg twice daily -increase Lamictal to 400 mg twice daily from 300 mg twice daily - Observe fall and seizure precautions -follow-up with Neurologist Dr. Beryle BeamsKofi Doonquah--  in 2 to 4 weeks for recheck and reevaluation--due to seizures -Clinically improving -MRI brain without acute stroke no acute finding  2) hypothermia and bradycardia due to poor status status and environmental elements=------ resolved  3)HTN/HLD/GERD--- resume preadmission medications  4)AKI----acute kidney injury with hyperkalemia -Resolved with hydration -Creatinine is down to 1.0 from 1.39, -Potassium is down to 4.5 from 5.3 , renally adjust medications, avoid nephrotoxic agents / dehydration  / hypotension  5)Closed head injury/forehead/scalp lacerations --- secondary to breakthrough seizure as above #1 -Brain MRI without acute findings, supportive care -Laceration/abrasions hemostatic, routine local wound care  6) chronic anemia--appears to be normocytic and normochromic, no concerns about ongoing bleeding,  7)DM2-A1c 7.6 reflecting uncontrolled DM with hyperglycemia PTA -Resume PTA meds, further medication adjustment per PCP  8)Social/Ethics--- patient with baseline cognitive and memory deficits in the setting of seizures for the last 7 years or so--- --she is a DNR -Her son is the primary decision maker  9) generalized weakness/deconditioning/falls--- PT and OT eval appreciated,  recommends SNF level rehab  Discharge Condition: Stable  Follow UP----neurologist as outpatient  Follow-up Information    Beryle Beamsoonquah,  Kofi, MD. Schedule an appointment as soon as possible for a visit in 3 week(s).   Specialty: Neurology Why: seizures Contact information: [address] [place] [address] [phone]              Consults obtained - NA  Diet and Activity recommendation:  As advised  Discharge Instructions    Discharge Instructions    Call MD for:  difficulty breathing, headache or visual disturbances   Complete by: As directed    Call MD for:  persistant dizziness or light-headedness   Complete by: As directed    Call MD for:  persistant nausea and vomiting   Complete by: As directed    Call MD for:  temperature >100.4   Complete by: As directed  Diet - low sodium heart healthy   Complete by: As directed    Discharge instructions   Complete by: As directed    1)Please follow-up with Neurologist Dr. Beryle Beams-- Phone: [phone], Address: [address], [place], [place] [address] in 2 to 4 weeks for recheck and reevaluation--due to seizures - please call to make appointment with him  2) Please increase lacosamide to 150 mg twice daily from 100 mg twice daily  3)Please increase Lamictal to 400 mg twice daily from 300 mg twice daily  4) please Observe fall and seizure precautions   Increase activity slowly   Complete by: As directed         Discharge Medications     Allergies as of 09/22/2020      Reactions   Tramadol    Other reaction(s): Seizures   Aspirin Hives   Aspirin Other (See Comments)   Pt reports hives with any dose above .      Medication List    STOP taking these medications   aspirin 81 MG chewable tablet Replaced by: aspirin EC 81 MG tablet     TAKE these medications   acetaminophen 325 MG tablet Commonly known as: TYLENOL Take 650 mg by mouth every 6 (six) hours as needed for mild pain.   aspirin EC 81 MG tablet Take 1 tablet (81 mg total) by mouth daily with breakfast. Replaces: aspirin 81 MG chewable tablet    Lacosamide 150 MG Tabs Take 1 tablet (150 mg total) by mouth 2 (two) times daily. What changed:   medication strength  how much to take   lamoTRIgine 200 MG tablet Commonly known as: LaMICtal Take 2 tablets (400 mg total) by mouth 2 (two) times daily. What changed:   medication strength  how much to take   lisinopril 10 MG tablet Commonly known as: ZESTRIL Take 10 mg by mouth daily.   loperamide 2 MG tablet Commonly known as: IMODIUM A-D Take 2 mg by mouth 4 (four) times daily as needed for diarrhea or loose stools.   LORazepam 2 MG/ML concentrated solution Commonly known as: ATIVAN Take 1 mg by mouth as needed for anxiety (Inject  IM as needed for seizure.  Repeat x 1 if needed.  Send to ER for any further seizures.).   LORazepam 1 MG tablet Commonly known as: ATIVAN Take 1 mg by mouth 3 (three) times daily.   melatonin 3 MG Tabs tablet Take by mouth.   metFORMIN 500 MG tablet Commonly known as: GLUCOPHAGE Take 500 mg by mouth daily with breakfast.   mirtazapine 7.5 MG tablet Commonly known as: REMERON Take 7.5 mg by mouth at bedtime.   NovoLOG FlexPen 100 UNIT/ML FlexPen Generic drug: insulin aspart Inject into the skin See admin instructions. Per sliding scale   OLANZapine 10 MG tablet Commonly known as: ZYPREXA Take 10 mg by mouth at bedtime.   phenytoin 50 MG tablet Commonly known as: DILANTIN Chew 150 mg by mouth at bedtime.   Vitamin D-3 125 MCG (5000 UT) Tabs Take 1 tablet by mouth daily.   Zinc Oxide 6 % Crea Apply 1 application topically in the morning and at bedtime. Apply to inner thighs       Major procedures and Radiology Reports - PLEASE review detailed and final reports for all details, in brief -   CT Head Wo Contrast  Result Date: 09/21/2020 CLINICAL DATA:  Mental status change.  Unknown cause. EXAM: CT HEAD WITHOUT CONTRAST TECHNIQUE: Contiguous  axial images were obtained from the base of the skull through the vertex without  intravenous contrast. COMPARISON:  MR head 03/25/2014 FINDINGS: Brain: Cerebral ventricle sizes are concordant with the degree of cerebral volume loss. Patchy and confluent areas of decreased attenuation are noted throughout the deep and periventricular white matter of the cerebral hemispheres bilaterally, compatible with chronic microvascular ischemic disease. No evidence of large-territorial acute infarction. No parenchymal hemorrhage. No mass lesion. No extra-axial collection. No mass effect or midline shift. No hydrocephalus. Basilar cisterns are patent. Vascular: No hyperdense vessel. Skull: No acute fracture or focal lesion. Sinuses/Orbits: Paranasal sinuses and mastoid air cells are clear. The orbits are unremarkable. Other: None. IMPRESSION: No acute intracranial abnormality. Electronically Signed   By: Tish Frederickson M.D.   On: 09/21/2020 18:15   MR BRAIN W WO CONTRAST  Result Date: 09/22/2020 CLINICAL DATA:  Acute neuro deficit.  Rule out stroke EXAM: MRI HEAD WITHOUT AND WITH CONTRAST TECHNIQUE: Multiplanar, multiecho pulse sequences of the brain and surrounding structures were obtained without and with intravenous contrast. CONTRAST:  7.59mL GADAVIST GADOBUTROL 1 MMOL/ML IV SOLN COMPARISON:  CT head 09/21/2020 FINDINGS: Brain: Generalized atrophy. Ventricular enlargement consistent with the level of atrophy. Mild white matter changes bilaterally. Negative for acute infarct, hemorrhage, mass. Normal enhancement following contrast infusion. Vascular: Normal arterial flow voids Skull and upper cervical spine: No focal skeletal lesion. Cervical spondylosis with disc protrusions at C3-4 and C4-5 causing some degree of spinal stenosis. Sinuses/Orbits: Paranasal sinuses clear. Bilateral cataract extraction Other: None IMPRESSION: Negative for acute infarct Generalized atrophy with mild white matter changes consistent with chronic microvascular ischemia Central disc protrusions at C3-4 and C4-5.  Electronically Signed   By: Marlan Palau M.D.   On: 09/22/2020 10:48   DG Chest Portable 1 View  Result Date: 09/21/2020 CLINICAL DATA:  Hypothermia EXAM: PORTABLE CHEST 1 VIEW COMPARISON:  None. FINDINGS: Heart and mediastinal contours are within normal limits. No focal opacities or effusions. No acute bony abnormality. IMPRESSION: No active disease. Electronically Signed   By: Charlett Nose M.D.   On: 09/21/2020 20:59    Micro Results   Recent Results (from the past 240 hour(s))  Resp Panel by RT-PCR (Flu A&B, Covid) Nasopharyngeal Swab     Status: None   Collection Time: 09/21/20  5:27 PM   Specimen: Nasopharyngeal Swab; Nasopharyngeal(NP) swabs in vial transport medium  Result Value Ref Range Status   SARS Coronavirus 2 by RT PCR NEGATIVE NEGATIVE Final    Comment: (NOTE) SARS-CoV-2 target nucleic acids are NOT DETECTED.  The SARS-CoV-2 RNA is generally detectable in upper respiratory specimens during the acute phase of infection. The lowest concentration of SARS-CoV-2 viral copies this assay can detect is 138 copies/mL. A negative result does not preclude SARS-Cov-2 infection and should not be used as the sole basis for treatment or other patient management decisions. A negative result may occur with  improper specimen collection/handling, submission of specimen other than nasopharyngeal swab, presence of viral mutation(s) within the areas targeted by this assay, and inadequate number of viral copies(<138 copies/mL). A negative result must be combined with clinical observations, patient history, and epidemiological information. The expected result is Negative.  Fact Sheet for Patients:  BloggerCourse.com  Fact Sheet for Healthcare Providers:  SeriousBroker.it  This test is no t yet approved or cleared by the [place] FDA and  has been authorized for detection and/or diagnosis of SARS-CoV-2 by FDA under an Emergency Use  Authorization (EUA). This EUA will remain  in effect (meaning this test can be used) for the duration of the COVID-19 declaration under Section 564(b)(1) of the Act, 21 U.S.C.section 360bbb-3(b)(1), unless the authorization is terminated  or revoked sooner.       Influenza A by PCR NEGATIVE NEGATIVE Final   Influenza B by PCR NEGATIVE NEGATIVE Final    Comment: (NOTE) The Xpert Xpress SARS-CoV-2/FLU/RSV plus assay is intended as an aid in the diagnosis of influenza from Nasopharyngeal swab specimens and should not be used as a sole basis for treatment. Nasal washings and aspirates are unacceptable for Xpert Xpress SARS-CoV-2/FLU/RSV testing.  Fact Sheet for Patients: BloggerCourse.com  Fact Sheet for Healthcare Providers: SeriousBroker.it  This test is not yet approved or cleared by the [place] FDA and has been authorized for detection and/or diagnosis of SARS-CoV-2 by FDA under an Emergency Use Authorization (EUA). This EUA will remain in effect (meaning this test can be used) for the duration of the COVID-19 declaration under Section 564(b)(1) of the Act, 21 U.S.C. section 360bbb-3(b)(1), unless the authorization is terminated or revoked.  Performed at [facility], [address] [place], [place] [address]   MRSA PCR Screening     Status: None   Collection Time: 09/22/20  2:01 AM   Specimen: Nasal Mucosa; Nasopharyngeal  Result Value Ref Range Status   MRSA by PCR NEGATIVE NEGATIVE Final    Comment:        The GeneXpert MRSA Assay (FDA approved for NASAL specimens only), is one component of a comprehensive MRSA colonization surveillance program. It is not intended to diagnose MRSA infection nor to guide or monitor treatment for MRSA infections. Performed at [facility], [address] [place], [place] [address]        Today   Subjective    Brooke Gross today has no new complaints =-Less  confused, No fevers, no vomiting          Patient has been seen and examined prior to discharge   Objective   Blood pressure (!) 141/65, pulse 66, temperature (!) 97.4 F (36.3 C), temperature source Oral, resp. rate 19, height 5' (1.524 m), weight 81.6 kg, SpO2 100 %.   Intake/Output Summary (Last 24 hours) at 09/22/2020 1518 Last data filed at 09/22/2020 0336 Gross per 24 hour  Intake 594.05 ml  Output --  Net 594.05 ml    Exam Gen:- Awake Alert, no acute distress  HEENT:-Forehead and scalp laceration/abrasions hemostatic Neck-Supple Neck,No JVD,.  Lungs-  CTAB , good air movement bilaterally  CV- S1, S2 normal, regular Abd-  +ve B.Sounds, Abd Soft, No tenderness,    Extremity/Skin:- No  edema,   good pulses Psych-baseline cognitive and memory deficits  neuro-generalized weakness , no new focal deficits, no tremors    Data Review   CBC w Diff:  Lab Results  Component Value Date   WBC 6.8 09/22/2020   HGB 10.2 (L) 09/22/2020   HGB 12.7 03/17/2017   HCT 31.9 (L) 09/22/2020   HCT 38.8 03/17/2017   PLT 283 09/22/2020   PLT 245 03/17/2017   LYMPHOPCT 23 09/21/2020   MONOPCT 10 09/21/2020   EOSPCT 4 09/21/2020   BASOPCT 0 09/21/2020    CMP:  Lab Results  Component Value Date   NA 143 09/22/2020   NA 144 03/17/2017   K 4.5 09/22/2020   CL 108 09/22/2020   CO2 26 09/22/2020   BUN 32 (H) 09/22/2020   BUN 24 03/17/2017   CREATININE 1.03 (H) 09/22/2020  PROT 6.3 (L) 09/22/2020   PROT 7.1 03/17/2017   ALBUMIN 3.3 (L) 09/22/2020   ALBUMIN 4.5 03/17/2017   BILITOT 0.3 09/22/2020   BILITOT <0.2 03/17/2017   ALKPHOS 95 09/22/2020   AST 17 09/22/2020   ALT 12 09/22/2020  .   Total Discharge time is about 33 minutes  Shon Hale M.D on 09/22/2020 at 3:18 PM  Go to [email or web address] -  for contact info  [facility] - Office  [phone]

## 2020-09-22 NOTE — Evaluation (Signed)
Physical Therapy Evaluation Patient Details Name: Brooke Gross MRN: [id number] DOB: 1941-02-26 Today's Date: 09/22/2020   History of Present Illness  Brooke Gross is a 80 y.o. female with medical history significant for seizures, hypertension, T2DM, hyperlipidemia, GERD and prior stroke who presents to the emergency department from [facility] due to altered mental status.  Patient was unable to provide a history at this time due to altered mental status, history was obtained from ED physician and ED medical record.  Per report, Nursing facility reported that patient sustained a fall with head injury about 1 week ago, she was evaluated at the ER and discharged with return precautions, patient has been functioning at her baseline mentation was noted to be less responsive at 3 PM (possibly in postictal state, EMS was activated and patient was taken to the ED for further evaluation and management.  Patient was reported to be able to stand but was unable to ambulate at baseline and that she responds yes/no verbally, but not conversational.     Clinical Impression  Session completed with OT. Patient limited for functional mobility as stated below secondary to BLE weakness, fatigue and poor standing balance. Patient initially lethargic and appearing confused but does respond to stimuli and questions. Patient becomes more alert with mobility and is able to maintain a conversation. Patient requires mod assist for bed mobility secondary to lethargy and weakness. She requires assist initially with sitting balance but is able to maintain sitting balance independently with UE support after several minutes. Patient demonstrates good sitting tolerance EOB. Patient transfers to standing with RW and min/mod assist for LE weakness along with cueing for sequencing and use of RW. She is able to ambulate slow, labored, unsteady steps at bedside with RW and cueing and is limited by fatigue. Patient ends  session seated in chair - RN aware. Patient will benefit from continued physical therapy in hospital and recommended venue below to increase strength, balance, endurance for safe ADLs and gait.     Follow Up Recommendations SNF    Equipment Recommendations  None recommended by PT    Recommendations for Other Services       Precautions / Restrictions Precautions Precautions: Fall Restrictions Weight Bearing Restrictions: No      Mobility  Bed Mobility Overal bed mobility: Needs Assistance Bed Mobility: Supine to Sit     Supine to sit: Mod assist     General bed mobility comments: Patient requires mod assist to upright trunk and for LE movement    Transfers Overall transfer level: Needs assistance Equipment used: Rolling walker (2 wheeled) Transfers: Sit to/from [employer] Transfers Sit to Stand: Min assist;Mod assist Stand pivot transfers: Min assist;Mod assist       General transfer comment: Patient requires mid/mod assist and RW to transfer to standing, cueing for RW use  Ambulation/Gait Ambulation/Gait assistance: Min assist Gait Distance (Feet): 3 Feet Assistive device: Rolling walker (2 wheeled) Gait Pattern/deviations: Shuffle Gait velocity: decreased   General Gait Details: slow, labored cadence with RW to ambulate to chair at bedside, unsteady, frequent cueing for sequencing  Stairs            Wheelchair Mobility    Modified Rankin (Stroke Patients Only)       Balance Overall balance assessment: Needs assistance Sitting-balance support: Bilateral upper extremity supported Sitting balance-Leahy Scale: Fair Sitting balance - Comments: initially requires assist but able to maintain sitting after several minutes with bilateral UE support   Standing balance support: Bilateral  upper extremity supported Standing balance-Leahy Scale: Poor Standing balance comment: with RW                             Pertinent Vitals/Pain  Pain Assessment: No/denies pain    Home Living Family/patient expects to be discharged to:: Skilled nursing facility                 Additional Comments: initially lethargic and appears confused but once alert, able to provide more info    Prior Function Level of Independence: Needs assistance   Gait / Transfers Assistance Needed: Patient states able to ambulate           Hand Dominance        Extremity/Trunk Assessment   Upper Extremity Assessment Upper Extremity Assessment: Defer to OT evaluation    Lower Extremity Assessment Lower Extremity Assessment: Generalized weakness       Communication   Communication: No difficulties  Cognition Arousal/Alertness: Awake/alert Behavior During Therapy: WFL for tasks assessed/performed Overall Cognitive Status: Within Functional Limits for tasks assessed                                 General Comments: initially lethargic and appears confused but once alert, able to provide more info and hold a conversation      General Comments      Exercises     Assessment/Plan    PT Assessment Patient needs continued PT services  PT Problem List Decreased strength;Decreased mobility;Decreased activity tolerance;Decreased balance       PT Treatment Interventions DME instruction;Therapeutic exercise;Gait training;Balance training;Stair training;Neuromuscular re-education;Functional mobility training;Therapeutic activities;Patient/family education    PT Goals (Current goals can be found in the Care Plan section)  Acute Rehab PT Goals Patient Stated Goal: return home PT Goal Formulation: With patient Time For Goal Achievement: 10/06/20 Potential to Achieve Goals: Good    Frequency Min 3X/week   Barriers to discharge        Co-evaluation PT/OT/SLP Co-Evaluation/Treatment: Yes Reason for Co-Treatment: Complexity of the patient's impairments (multi-system involvement);For patient/therapist safety;To  address functional/ADL transfers PT goals addressed during session: Mobility/safety with mobility;Balance;Proper use of DME;Strengthening/ROM         AM-PAC PT "6 Clicks" Mobility  Outcome Measure Help needed turning from your back to your side while in a flat bed without using bedrails?: A Little Help needed moving from lying on your back to sitting on the side of a flat bed without using bedrails?: A Little Help needed moving to and from a bed to a chair (including a wheelchair)?: A Lot Help needed standing up from a chair using your arms (e.g., wheelchair or bedside chair)?: A Little Help needed to walk in hospital room?: A Lot Help needed climbing 3-5 steps with a railing? : A Lot 6 Click Score: 15    End of Session Equipment Utilized During Treatment: Gait belt Activity Tolerance: Patient tolerated treatment well;Patient limited by fatigue Patient left: in chair;with call bell/phone within reach;with chair alarm set Nurse Communication: Mobility status PT Visit Diagnosis: Unsteadiness on feet (R26.81);Other abnormalities of gait and mobility (R26.89);Muscle weakness (generalized) (M62.81);History of falling (Z91.81)    Time: 8850-2774 PT Time Calculation (min) (ACUTE ONLY): 22 min   Charges:   PT Evaluation $PT Eval Low Complexity: 1 Low           11:25 AM, 09/22/20 Wyman Songster PT,  DPT Physical Therapist at [facility]

## 2020-09-22 NOTE — TOC Transition Note (Signed)
Transition of Care [facility]) - CM/SW Discharge Note   Patient Details  Name: Chau Savell MRN: [id number] Date of Birth: August 05, 1940  [facility] CM/SW Contact:  Elliot Gault, LCSW Phone Number: 09/22/2020, 2:09 PM   Clinical Narrative:     Pt admitted as observation status from [facility]. MD states pt stable for dc. Updated Jill Side at [facility]. They can accept pt back today. DC clinical will be sent electronically. RN to call report. Pt will transport with EMS.  There are no other TOC needs for dc.  Final next level of care: Skilled Nursing Facility Barriers to Discharge: Barriers Resolved   Patient Goals and CMS Choice        Discharge Placement                       Discharge Plan and Services                                     Social Determinants of Health (SDOH) Interventions     Readmission Risk Interventions No flowsheet data found.

## 2020-09-22 NOTE — Progress Notes (Signed)
Nsg Discharge Note  Admit Date:  09/21/2020 Discharge date: 09/22/2020   Brooke Gross to be D/C'd Skilled nursing facility per MD order.  AVS completed.  Copy for chart, and copy for patient signed, and dated. Patient/caregiver able to verbalize understanding. IV removed-CDI. Report called to [place] at [facility]. [facility] came to transport patient to [facility]. Discharge Medication: Allergies as of 09/22/2020      Reactions   Tramadol    Other reaction(s): Seizures   Aspirin Hives   Aspirin Other (See Comments)   Pt reports hives with any dose above 81mg .      Medication List    STOP taking these medications   aspirin 81 MG chewable tablet Replaced by: aspirin EC 81 MG tablet     TAKE these medications   acetaminophen 325 MG tablet Commonly known as: TYLENOL Take 650 mg by mouth every 6 (six) hours as needed for mild pain.   aspirin EC 81 MG tablet Take 1 tablet (81 mg total) by mouth daily with breakfast. Replaces: aspirin 81 MG chewable tablet   Lacosamide 150 MG Tabs Take 1 tablet (150 mg total) by mouth 2 (two) times daily. What changed:   medication strength  how much to take   lamoTRIgine 200 MG tablet Commonly known as: LaMICtal Take 2 tablets (400 mg total) by mouth 2 (two) times daily. What changed:   medication strength  how much to take   lisinopril 10 MG tablet Commonly known as: ZESTRIL Take 10 mg by mouth daily.   loperamide 2 MG tablet Commonly known as: IMODIUM A-D Take 2 mg by mouth 4 (four) times daily as needed for diarrhea or loose stools.   LORazepam 2 MG/ML concentrated solution Commonly known as: ATIVAN Take 1 mg by mouth as needed for anxiety (Inject 1mg  IM as needed for seizure.  Repeat x 1 if needed.  Send to ER for any further seizures.).   LORazepam 1 MG tablet Commonly known as: ATIVAN Take 1 mg by mouth 3 (three) times daily.   melatonin 3 MG Tabs tablet Take by mouth.   metFORMIN 500 MG  tablet Commonly known as: GLUCOPHAGE Take 500 mg by mouth daily with breakfast.   mirtazapine 7.5 MG tablet Commonly known as: REMERON Take 7.5 mg by mouth at bedtime.   NovoLOG FlexPen 100 UNIT/ML FlexPen Generic drug: insulin aspart Inject into the skin See admin instructions. Per sliding scale   OLANZapine 10 MG tablet Commonly known as: ZYPREXA Take 10 mg by mouth at bedtime.   phenytoin 50 MG tablet Commonly known as: DILANTIN Chew 150 mg by mouth at bedtime.   Vitamin D-3 125 MCG (5000 UT) Tabs Take 1 tablet by mouth daily.   Zinc Oxide 6 % Crea Apply 1 application topically in the morning and at bedtime. Apply to inner thighs       Discharge Assessment: Vitals:   09/22/20 0432 09/22/20 1345  BP: (!) 130/50 (!) 141/65  Pulse: (!) 52 66  Resp: 16 19  Temp: 98.1 F (36.7 C) (!) 97.4 F (36.3 C)  SpO2: 99% 100%   Skin clean, dry and intact without evidence of skin break down, no evidence of skin tears noted. IV catheter discontinued intact. Site without signs and symptoms of complications - no redness or edema noted at insertion site, patient denies c/o pain - only slight tenderness at site.  Dressing with slight pressure applied.  D/c Instructions-Education: Discharge instructions given to patient/family with verbalized understanding. D/c  education completed with patient/family including follow up instructions, medication list, d/c activities limitations if indicated, with other d/c instructions as indicated by MD - patient able to verbalize understanding, all questions fully answered. Patient instructed to return to ED, call 911, or call MD for any changes in condition.  Patient escorted via WC, and D/C home via private auto.  Karolee Ohs, RN 09/22/2020 5:55 PM

## 2020-09-22 NOTE — Discharge Instructions (Signed)
1)Please follow-up with Neurologist Dr. Beryle Beams-- Phone: [phone], Address: [address], [place], [place] [address] in 2 to 4 weeks for recheck and reevaluation--due to seizures - please call to make appointment with him  2) Please increase lacosamide to 150 mg twice daily from 100 mg twice daily  3)Please increase Lamictal to 400 mg twice daily from 300 mg twice daily  4) please Observe fall and seizure precautions

## 2020-09-22 NOTE — Progress Notes (Signed)
Removed 2 stitches in forehead per request of [place] at [facility].

## 2020-09-25 LAB — LAMOTRIGINE LEVEL: Lamotrigine Lvl: 3.8 ug/mL (ref 2.0–20.0)

## 2021-02-13 ENCOUNTER — Encounter (HOSPITAL_COMMUNITY): Payer: Self-pay

## 2021-02-13 ENCOUNTER — Inpatient Hospital Stay (HOSPITAL_COMMUNITY): Payer: Medicare Other

## 2021-02-13 ENCOUNTER — Inpatient Hospital Stay (HOSPITAL_COMMUNITY)
Admission: EM | Admit: 2021-02-13 | Discharge: 2021-02-21 | DRG: 177 | Disposition: A | Discharge status: Hospice | Payer: Medicare Other | Source: Skilled Nursing Facility | Attending: Internal Medicine | Admitting: Internal Medicine

## 2021-02-13 ENCOUNTER — Inpatient Hospital Stay (HOSPITAL_COMMUNITY)
Admit: 2021-02-13 | Discharge: 2021-02-13 | Disposition: A | Discharge status: Home / Self Care | Payer: Medicare Other | Attending: Internal Medicine | Admitting: Internal Medicine

## 2021-02-13 ENCOUNTER — Emergency Department (HOSPITAL_COMMUNITY): Payer: Medicare Other

## 2021-02-13 ENCOUNTER — Other Ambulatory Visit: Payer: Self-pay

## 2021-02-13 DIAGNOSIS — Z7982 Long term (current) use of aspirin: Secondary | ICD-10-CM

## 2021-02-13 DIAGNOSIS — N189 Chronic kidney disease, unspecified: Secondary | ICD-10-CM | POA: Diagnosis present

## 2021-02-13 DIAGNOSIS — Z886 Allergy status to analgesic agent status: Secondary | ICD-10-CM

## 2021-02-13 DIAGNOSIS — R1310 Dysphagia, unspecified: Secondary | ICD-10-CM | POA: Diagnosis present

## 2021-02-13 DIAGNOSIS — G20 Parkinson's disease: Secondary | ICD-10-CM | POA: Diagnosis present

## 2021-02-13 DIAGNOSIS — I2510 Atherosclerotic heart disease of native coronary artery without angina pectoris: Secondary | ICD-10-CM | POA: Diagnosis present

## 2021-02-13 DIAGNOSIS — R569 Unspecified convulsions: Secondary | ICD-10-CM

## 2021-02-13 DIAGNOSIS — I129 Hypertensive chronic kidney disease with stage 1 through stage 4 chronic kidney disease, or unspecified chronic kidney disease: Secondary | ICD-10-CM | POA: Diagnosis present

## 2021-02-13 DIAGNOSIS — R4182 Altered mental status, unspecified: Secondary | ICD-10-CM

## 2021-02-13 DIAGNOSIS — Z515 Encounter for palliative care: Secondary | ICD-10-CM | POA: Diagnosis not present

## 2021-02-13 DIAGNOSIS — R4701 Aphasia: Secondary | ICD-10-CM | POA: Diagnosis present

## 2021-02-13 DIAGNOSIS — Z66 Do not resuscitate: Secondary | ICD-10-CM | POA: Diagnosis present

## 2021-02-13 DIAGNOSIS — J9601 Acute respiratory failure with hypoxia: Secondary | ICD-10-CM | POA: Diagnosis not present

## 2021-02-13 DIAGNOSIS — Z7189 Other specified counseling: Secondary | ICD-10-CM | POA: Diagnosis not present

## 2021-02-13 DIAGNOSIS — E1122 Type 2 diabetes mellitus with diabetic chronic kidney disease: Secondary | ICD-10-CM | POA: Diagnosis present

## 2021-02-13 DIAGNOSIS — J690 Pneumonitis due to inhalation of food and vomit: Secondary | ICD-10-CM | POA: Diagnosis not present

## 2021-02-13 DIAGNOSIS — Z7984 Long term (current) use of oral hypoglycemic drugs: Secondary | ICD-10-CM

## 2021-02-13 DIAGNOSIS — K219 Gastro-esophageal reflux disease without esophagitis: Secondary | ICD-10-CM | POA: Diagnosis present

## 2021-02-13 DIAGNOSIS — F419 Anxiety disorder, unspecified: Secondary | ICD-10-CM | POA: Diagnosis present

## 2021-02-13 DIAGNOSIS — I252 Old myocardial infarction: Secondary | ICD-10-CM | POA: Diagnosis not present

## 2021-02-13 DIAGNOSIS — Z8249 Family history of ischemic heart disease and other diseases of the circulatory system: Secondary | ICD-10-CM | POA: Diagnosis not present

## 2021-02-13 DIAGNOSIS — R0603 Acute respiratory distress: Secondary | ICD-10-CM | POA: Diagnosis not present

## 2021-02-13 DIAGNOSIS — D849 Immunodeficiency, unspecified: Secondary | ICD-10-CM | POA: Diagnosis present

## 2021-02-13 DIAGNOSIS — Z79899 Other long term (current) drug therapy: Secondary | ICD-10-CM

## 2021-02-13 DIAGNOSIS — R471 Dysarthria and anarthria: Secondary | ICD-10-CM | POA: Diagnosis present

## 2021-02-13 DIAGNOSIS — F17220 Nicotine dependence, chewing tobacco, uncomplicated: Secondary | ICD-10-CM | POA: Diagnosis present

## 2021-02-13 DIAGNOSIS — Z8673 Personal history of transient ischemic attack (TIA), and cerebral infarction without residual deficits: Secondary | ICD-10-CM

## 2021-02-13 DIAGNOSIS — G40911 Epilepsy, unspecified, intractable, with status epilepticus: Secondary | ICD-10-CM | POA: Diagnosis present

## 2021-02-13 DIAGNOSIS — Z20822 Contact with and (suspected) exposure to covid-19: Secondary | ICD-10-CM | POA: Diagnosis present

## 2021-02-13 DIAGNOSIS — R0902 Hypoxemia: Secondary | ICD-10-CM

## 2021-02-13 DIAGNOSIS — E7800 Pure hypercholesterolemia, unspecified: Secondary | ICD-10-CM | POA: Diagnosis present

## 2021-02-13 DIAGNOSIS — G9341 Metabolic encephalopathy: Secondary | ICD-10-CM | POA: Diagnosis not present

## 2021-02-13 DIAGNOSIS — F319 Bipolar disorder, unspecified: Secondary | ICD-10-CM | POA: Diagnosis present

## 2021-02-13 LAB — CBC WITH DIFFERENTIAL/PLATELET
Abs Immature Granulocytes: 0.42 10*3/uL — ABNORMAL HIGH (ref 0.00–0.07)
Basophils Absolute: 0.1 10*3/uL (ref 0.0–0.1)
Basophils Relative: 1 %
Eosinophils Absolute: 0.3 10*3/uL (ref 0.0–0.5)
Eosinophils Relative: 2 %
HCT: 33.5 % — ABNORMAL LOW (ref 36.0–46.0)
Hemoglobin: 10.8 g/dL — ABNORMAL LOW (ref 12.0–15.0)
Immature Granulocytes: 4 %
Lymphocytes Relative: 9 %
Lymphs Abs: 0.9 10*3/uL (ref 0.7–4.0)
MCH: 30.6 pg (ref 26.0–34.0)
MCHC: 32.2 g/dL (ref 30.0–36.0)
MCV: 94.9 fL (ref 80.0–100.0)
Monocytes Absolute: 0.6 10*3/uL (ref 0.1–1.0)
Monocytes Relative: 6 %
Neutro Abs: 8.4 10*3/uL — ABNORMAL HIGH (ref 1.7–7.7)
Neutrophils Relative %: 78 %
Platelets: 284 10*3/uL (ref 150–400)
RBC: 3.53 MIL/uL — ABNORMAL LOW (ref 3.87–5.11)
RDW: 14.1 % (ref 11.5–15.5)
WBC: 10.6 10*3/uL — ABNORMAL HIGH (ref 4.0–10.5)
nRBC: 0 % (ref 0.0–0.2)

## 2021-02-13 LAB — BRAIN NATRIURETIC PEPTIDE: B Natriuretic Peptide: 256 pg/mL — ABNORMAL HIGH (ref 0.0–100.0)

## 2021-02-13 LAB — PHENYTOIN LEVEL, TOTAL: Phenytoin Lvl: <2.5 ug/mL — ABNORMAL LOW (ref 10.0–20.0)

## 2021-02-13 LAB — COMPREHENSIVE METABOLIC PANEL
ALT: 12 U/L (ref 0–44)
AST: 16 U/L (ref 15–41)
Albumin: 2.8 g/dL — ABNORMAL LOW (ref 3.5–5.0)
Alkaline Phosphatase: 74 U/L (ref 38–126)
Anion gap: 5 (ref 5–15)
BUN: 13 mg/dL (ref 8–23)
CO2: 25 mmol/L (ref 22–32)
Calcium: 8.7 mg/dL — ABNORMAL LOW (ref 8.9–10.3)
Chloride: 109 mmol/L (ref 98–111)
Creatinine, Ser: 0.86 mg/dL (ref 0.44–1.00)
GFR, Estimated: >60 mL/min (ref >60)
Glucose, Bld: 178 mg/dL — ABNORMAL HIGH (ref 70–99)
Potassium: 3.6 mmol/L (ref 3.5–5.1)
Sodium: 139 mmol/L (ref 135–145)
Total Bilirubin: 0.3 mg/dL (ref 0.3–1.2)
Total Protein: 6.5 g/dL (ref 6.5–8.1)

## 2021-02-13 LAB — RESP PANEL BY RT-PCR (FLU A&B, COVID) ARPGX2
Influenza A by PCR: NEGATIVE
Influenza B by PCR: NEGATIVE
SARS Coronavirus 2 by RT PCR: NEGATIVE

## 2021-02-13 LAB — BLOOD GAS, ARTERIAL
Acid-base deficit: 0.4 mmol/L (ref 0.0–2.0)
Bicarbonate: 23.8 mmol/L (ref 20.0–28.0)
FIO2: 44
O2 Saturation: 91.5 %
Patient temperature: 37.3
pCO2 arterial: 43.6 mmHg (ref 32.0–48.0)
pH, Arterial: 7.364 (ref 7.350–7.450)
pO2, Arterial: 62 mmHg — ABNORMAL LOW (ref 83.0–108.0)

## 2021-02-13 LAB — CBG MONITORING, ED: Glucose-Capillary: 151 mg/dL — ABNORMAL HIGH (ref 70–99)

## 2021-02-13 MED ORDER — SODIUM CHLORIDE 0.9 % IV SOLN: 150.0000 mg | Freq: Every day | INTRAVENOUS | Status: DC

## 2021-02-13 MED ORDER — LACOSAMIDE 200 MG/20ML IV SOLN
INTRAVENOUS | Status: AC
  Filled 2021-02-13: qty 20

## 2021-02-13 MED ORDER — LISINOPRIL 10 MG PO TABS
20.0000 mg | ORAL_TABLET | Freq: Every day | ORAL | Status: DC
  Filled 2021-02-13: qty 2

## 2021-02-13 MED ORDER — LAMOTRIGINE 100 MG PO TABS
400.0000 mg | ORAL_TABLET | Freq: Two times a day (BID) | ORAL | Status: DC
  Administered 2021-02-14 – 2021-02-15 (×4): 400 mg via ORAL
  Filled 2021-02-13 (×6): qty 4

## 2021-02-13 MED ORDER — PHENYTOIN SODIUM 50 MG/ML IJ SOLN
INTRAMUSCULAR | Status: AC
  Filled 2021-02-13: qty 4

## 2021-02-13 MED ORDER — IPRATROPIUM-ALBUTEROL 0.5-2.5 (3) MG/3ML IN SOLN: 3.0000 mL | Freq: Four times a day (QID) | RESPIRATORY_TRACT | Status: DC

## 2021-02-13 MED ORDER — ENOXAPARIN SODIUM 40 MG/0.4ML IJ SOSY
40.0000 mg | PREFILLED_SYRINGE | INTRAMUSCULAR | Status: DC
  Administered 2021-02-13 – 2021-02-15 (×3): 40 mg via SUBCUTANEOUS
  Filled 2021-02-13 (×3): qty 0.4

## 2021-02-13 MED ORDER — ONDANSETRON HCL 4 MG/2ML IJ SOLN
4.0000 mg | Freq: Four times a day (QID) | INTRAMUSCULAR | Status: DC | PRN
  Administered 2021-02-13 – 2021-02-15 (×2): 4 mg via INTRAVENOUS
  Filled 2021-02-13 (×2): qty 2

## 2021-02-13 MED ORDER — SODIUM CHLORIDE 0.9 % IV SOLN
200.0000 mg | Freq: Two times a day (BID) | INTRAVENOUS | Status: DC
  Administered 2021-02-13 – 2021-02-16 (×5): 200 mg via INTRAVENOUS
  Filled 2021-02-13 (×6): qty 20

## 2021-02-13 MED ORDER — SODIUM CHLORIDE 0.9 % IV SOLN
500.0000 mg | INTRAVENOUS | Status: DC
  Administered 2021-02-14 – 2021-02-15 (×2): 500 mg via INTRAVENOUS
  Filled 2021-02-13 (×2): qty 500

## 2021-02-13 MED ORDER — SODIUM CHLORIDE 0.9 % IV SOLN
1.0000 g | Freq: Once | INTRAVENOUS | Status: AC
  Administered 2021-02-13: 1 g via INTRAVENOUS
  Filled 2021-02-13: qty 10

## 2021-02-13 MED ORDER — VITAMIN D 25 MCG (1000 UNIT) PO TABS
5000.0000 [IU] | ORAL_TABLET | Freq: Every day | ORAL | Status: DC
  Filled 2021-02-13: qty 5

## 2021-02-13 MED ORDER — FUROSEMIDE 10 MG/ML IJ SOLN
40.0000 mg | Freq: Once | INTRAMUSCULAR | Status: AC
  Administered 2021-02-13: 40 mg via INTRAVENOUS
  Filled 2021-02-13: qty 4

## 2021-02-13 MED ORDER — HYDRALAZINE HCL 20 MG/ML IJ SOLN: 10.0000 mg | Freq: Four times a day (QID) | INTRAMUSCULAR | Status: DC | PRN

## 2021-02-13 MED ORDER — LACOSAMIDE 50 MG PO TABS: 200.0000 mg | ORAL_TABLET | Freq: Two times a day (BID) | ORAL | Status: DC

## 2021-02-13 MED ORDER — CARBIDOPA-LEVODOPA 25-100 MG PO TABS
1.0000 | ORAL_TABLET | Freq: Two times a day (BID) | ORAL | Status: DC
  Administered 2021-02-14 – 2021-02-15 (×4): 1 via ORAL
  Filled 2021-02-13 (×4): qty 1

## 2021-02-13 MED ORDER — SODIUM CHLORIDE 0.9 % IV SOLN
3.0000 g | Freq: Three times a day (TID) | INTRAVENOUS | Status: DC
  Administered 2021-02-13 – 2021-02-16 (×9): 3 g via INTRAVENOUS
  Filled 2021-02-13 (×13): qty 8
  Filled 2021-02-13: qty 3
  Filled 2021-02-13 (×2): qty 8
  Filled 2021-02-13 (×3): qty 3
  Filled 2021-02-13 (×2): qty 8

## 2021-02-13 MED ORDER — SODIUM CHLORIDE 0.9 % IV SOLN
500.0000 mg | Freq: Once | INTRAVENOUS | Status: AC
  Administered 2021-02-13: 500 mg via INTRAVENOUS
  Filled 2021-02-13: qty 500

## 2021-02-13 MED ORDER — ASPIRIN 300 MG RE SUPP
300.0000 mg | Freq: Every day | RECTAL | Status: DC
  Filled 2021-02-13 (×2): qty 1

## 2021-02-13 MED ORDER — LEVETIRACETAM IN NACL 500 MG/100ML IV SOLN
500.0000 mg | Freq: Two times a day (BID) | INTRAVENOUS | Status: DC
  Administered 2021-02-13 – 2021-02-14 (×2): 500 mg via INTRAVENOUS
  Filled 2021-02-13: qty 100

## 2021-02-13 MED ORDER — PHENYTOIN SODIUM 50 MG/ML IJ SOLN
75.0000 mg | Freq: Two times a day (BID) | INTRAMUSCULAR | Status: DC
  Administered 2021-02-13: 75 mg via INTRAVENOUS
  Filled 2021-02-13: qty 2

## 2021-02-13 MED ORDER — ACETAMINOPHEN 325 MG PO TABS: 650.0000 mg | ORAL_TABLET | Freq: Four times a day (QID) | ORAL | Status: DC | PRN

## 2021-02-13 MED ORDER — ONDANSETRON HCL 4 MG PO TABS: 4.0000 mg | ORAL_TABLET | Freq: Four times a day (QID) | ORAL | Status: DC | PRN

## 2021-02-13 MED ORDER — POTASSIUM CHLORIDE IN NACL 20-0.9 MEQ/L-% IV SOLN
INTRAVENOUS | Status: AC
  Filled 2021-02-13: qty 1000

## 2021-02-13 MED ORDER — ADULT MULTIVITAMIN W/MINERALS CH
1.0000 | ORAL_TABLET | Freq: Every day | ORAL | Status: DC
  Filled 2021-02-13: qty 1

## 2021-02-13 MED ORDER — ACETAMINOPHEN 650 MG RE SUPP: 650.0000 mg | Freq: Four times a day (QID) | RECTAL | Status: DC | PRN

## 2021-02-13 MED ORDER — IPRATROPIUM-ALBUTEROL 0.5-2.5 (3) MG/3ML IN SOLN
3.0000 mL | Freq: Four times a day (QID) | RESPIRATORY_TRACT | Status: DC
  Administered 2021-02-13 – 2021-02-15 (×7): 3 mL via RESPIRATORY_TRACT
  Filled 2021-02-13 (×7): qty 3

## 2021-02-13 NOTE — ED Notes (Signed)
Dr at bedside during triage.  

## 2021-02-13 NOTE — ED Notes (Signed)
Attending at bedside.

## 2021-02-13 NOTE — Consult Note (Signed)
[facility] Oluwadamilare Tobler A. Gerilyn Pilgrimoonquah, MD     [email or web address]          Brooke Gross is an 80 y.o. female.   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 Because of poor p.o. intake, and NG tube will be placed. She will be started on Sinemet and does titrated. INTRACTABLE EPILEPSY: Phenytoin level was checked. The level is subtherapeutic indicate that she has not been getting medication or poor absorption. The phenytoin will be discontinued and Keppra initiated. We will continue with Lamictal and the Vimpat. 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 I suggested another spot EEG be done after with made changes to her medications. 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   If not improved with appropriate medications, consider an imaging. MRI will be preferred if possible.      The patient has a longstanding history of epilepsy. She has had status epilepticus in the past. Previous notes are reviewed and some copied below. She resides in a nursing home facility. It appears that she presents with the overall down turn in functional ability, movements and global weakness along with the altered mental status. She cannot provide a history. The workup has been relatively negative for toxic metabolic processes. She appears to be severely parkinsonian on examination however. She was transferred to the ICU because of requiring 6 L of oxygen. She has had some difficulty swallowing on the floor with a lot of secretions.   GENERAL:  She has severe psychomotor slowing.  HEENT:  No trauma noted. There is severe axial rigidity. There is increased secretions at times.  ABDOMEN: soft  EXTREMITIES: No edema   BACK: Normal  SKIN: Normal by inspection.    MENTAL STATUS:  She is awake and  alert. She at tends a and traction focus and attempts to follow commands infrequently. She is mute.  CRANIAL NERVES: Pupils are equal, round and reactive to light and accomodation; extra ocular movements are full, there is no significant nystagmus; visual fields are full; upper and lower facial muscles are normal in strength and symmetric, there is no flattening of the nasolabial folds; tongue is midline; uvula is midline; shoulder elevation is normal.  MOTOR:  She has antigravity strength. There is severe appendicular rigidity.  COORDINATION:  There is intermittent rest tremor bilaterally much more on the right side. There is severe bradykinesia, cogwheeling and rigidity. Rigidity is somewhat more on the left side.  REFLEXES: Deep tendon reflexes are symmetrical and normal.   SENSATION: Normal to pain.        BAPTIST NOTE 06-2018 REASON FOR CONSULT/CHIEF COMPLAINT: Epilepsy with history of status epilepticus, cognitive impairment, stroke  HPI: Pt is a 80 y.o. female with pertinent PMH significant for hypertension, hyperlipidemia, diabetes, epilepsy,? Stroke (06/2017) who presents to clinic from SNF transported on a stretcher with a representative from the nursing home. History obtained by a combination of chart review, discussion with the son on the phone, [facility]ne Keel[facility].  In brief, per my discussions the following approximate time course of events occurred regarding her history of seizures: - Onset: 642015 (son endorses that he has spoken on the phone with her and then later on could not get a hold her neither "other family members so they called EMS who went to do a check and found her unresponsive. She  was then found to be in status epilepticus and reportedly placed in a coma. Son denies any known history of seizures prior to that time) -Risk factors: No febrile seizure, no known head trauma, no CNS infection, no family history of  seizures -Semiology: 1. Per outside notes she has previously had right sided weakness and aphasia (2015, 06/2017) associated with seizures.  2. Will have staring and mumbling and intermittent jerking per SNF notes.  - Prior AED: LEV  - current AED: LCM, PHT, LTG   Dr. Doyne Keel notes that she was being followed by [facility] neurolog[facility] but they were trying to decrease her AEDs and after each time she went into status epilepticus. She most recently was hospitalized in December 2019 again after her medication doses were decreased. Her most recent seizure based on the SNF documentation was 06/11/2018 and Dr. Doyne Keel notes that her dose of lamotrigine was recently increased.        Blood pressure (!) 164/75, pulse 69, temperature 99.1 F (37.3 C), resp. rate 20, height 5\' 6"  (1.676 m), weight 81.6 kg, SpO2 100 %.  Past Medical History:  Diagnosis Date   Abnormality of gait 12/15/2013   Anemia    Anxiety    Anxiety disorder    Bipolar 1 disorder (HCC)    CKD (chronic kidney disease)    Depression    Diabetes (HCC)    Dysphagia    Epilepsy (HCC)    Gait difficulty    GERD (gastroesophageal reflux disease)    Hypercholesteremia    Hyperlipemia    Hyperlipidemia    Hypertension    Immunodeficiency (HCC)    Major depressive disorder    Malaise    Migraine    NSTEMI, initial episode of care (HCC) 03/18/2014   Obese    Sciatica    Seizures (HCC)    Tremor    Vertigo, labyrinthine     Past Surgical History:  Procedure Laterality Date   CATARACT EXTRACTION Bilateral    CERVICAL SPINE SURGERY     cholecsystectomy  1977   CHOLECYSTECTOMY     COLONOSCOPY  2010   degenerative disc diease     NECK SURGERY  1990s   TONSILLECTOMY      Family History  Problem Relation Age of Onset   Other Mother        unsure of history   Heart attack Father    Dementia Mother    Congestive Heart Failure Mother    Dementia Father     Social History:  reports that she quit smoking about 15  years ago. Her smoking use included cigarettes. She has a 7.50 pack-year smoking history. Her smokeless tobacco use includes chew. She reports that she does not currently use alcohol. She reports that she does not use drugs.  Allergies:  Allergies  Allergen Reactions   Tramadol     Other reaction(s): Seizures   Aspirin Hives   Aspirin Other (See Comments)    Pt reports hives with any dose above 81mg .    Medications: Prior to Admission medications   Medication Sig Start Date End Date Taking? Authorizing Provider  acetaminophen (TYLENOL) 325 MG tablet Take 650 mg by mouth every 6 (six) hours as needed for mild pain.   Yes [provider]  aspirin EC 81 MG tablet Take 1 tablet (81 mg total) by mouth daily with breakfast. 09/22/20 09/22/21 Yes Emokpae, Courage, MD  cefdinir (OMNICEF) 300 MG capsule Take 600 mg by mouth daily.  02/14/21 Yes [provider]  Cholecalciferol (VITAMIN D-3) 125 MCG (5000 UT) TABS Take 1 tablet by mouth daily.   Yes [provider]  Lacosamide 150 MG TABS Take 1 tablet (150 mg total) by mouth 2 (two) times daily. Patient taking differently: Take 200 mg by mouth 2 (two) times daily. 09/22/20  Yes Shon Hale, MD  lamoTRIgine (LAMICTAL) 200 MG tablet Take 2 tablets (400 mg total) by mouth 2 (two) times daily. 09/22/20  Yes Emokpae, Courage, MD  lisinopril (PRINIVIL,ZESTRIL) 10 MG tablet Take 20 mg by mouth daily.   Yes [provider]  loperamide (IMODIUM A-D) 2 MG tablet Take 2 mg by mouth 4 (four) times daily as needed for diarrhea or loose stools.   Yes [provider]  LORazepam (ATIVAN) 2 MG/ML concentrated solution Take 1 mg by mouth as needed for anxiety (Inject 1mg  IM as needed for seizure.  Repeat x 1 if needed.  Send to ER for any further seizures.).   Yes [provider]  metFORMIN (GLUCOPHAGE) 500 MG tablet Take 500 mg by mouth daily with breakfast.    Yes [provider]  Multiple Vitamin  (MULTIVITAMIN WITH MINERALS) TABS tablet Take 1 tablet by mouth daily.   Yes [provider]  NOVOLOG FLEXPEN 100 UNIT/ML FlexPen Inject into the skin See admin instructions. Per sliding scale 05/20/20  Yes [provider]  LORazepam (ATIVAN) 1 MG tablet Take 1 mg by mouth 3 (three) times daily. Patient not taking: Reported on 02/13/2021 06/20/20   [provider]  Melatonin 3 MG TABS Take by mouth. Patient not taking: Reported on 02/13/2021    [provider]  mirtazapine (REMERON) 7.5 MG tablet Take 7.5 mg by mouth at bedtime. Patient not taking: Reported on 02/13/2021 07/02/20   [provider]  OLANZapine (ZYPREXA) 10 MG tablet Take 10 mg by mouth at bedtime. Patient not taking: Reported on 02/13/2021    [provider]  phenytoin (DILANTIN) 50 MG tablet Chew 150 mg by mouth at bedtime. Patient not taking: Reported on 02/13/2021    [provider]  Zinc Oxide 6 % CREA Apply 1 application topically in the morning and at bedtime. Apply to inner thighs Patient not taking: Reported on 02/13/2021    [provider]    Scheduled Meds:  cholecalciferol  5,000 Units Oral Daily   enoxaparin (LOVENOX) injection  40 mg Subcutaneous Q24H   lacosamide  200 mg Oral BID   lamoTRIgine  400 mg Oral BID   lisinopril  20 mg Oral Daily   multivitamin with minerals  1 tablet Oral Daily   Continuous Infusions:  0.9 % NaCl with KCl 20 mEq / L 75 mL/hr at 02/13/21 1212   ampicillin-sulbactam (UNASYN) IV 3 g (02/13/21 1743)   [START ON 02/14/2021] azithromycin     PRN Meds:.acetaminophen **OR** acetaminophen, hydrALAZINE, ondansetron **OR** ondansetron (ZOFRAN) IV     Results for orders placed or performed during the hospital encounter of 02/13/21 (from the past 48 hour(s))  CBG monitoring, ED     Status: Abnormal   Collection Time: 02/13/21  7:26 AM  Result Value Ref Range   Glucose-Capillary 151 (H) 70 - 99 mg/dL    Comment: Glucose  reference range applies only to samples taken after fasting for at least 8 hours.  Comprehensive metabolic panel     Status: Abnormal   Collection Time: 02/13/21  7:27 AM  Result Value Ref Range   Sodium 139 135 - 145 mmol/L  Potassium 3.6 3.5 - 5.1 mmol/L   Chloride 109 98 - 111 mmol/L   CO2 25 22 - 32 mmol/L   Glucose, Bld 178 (H) 70 - 99 mg/dL    Comment: Glucose reference range applies only to samples taken after fasting for at least 8 hours.   BUN 13 8 - 23 mg/dL   Creatinine, Ser 0.98 0.44 - 1.00 mg/dL   Calcium 8.7 (L) 8.9 - 10.3 mg/dL   Total Protein 6.5 6.5 - 8.1 g/dL   Albumin 2.8 (L) 3.5 - 5.0 g/dL   AST 16 15 - 41 U/L   ALT 12 0 - 44 U/L   Alkaline Phosphatase 74 38 - 126 U/L   Total Bilirubin 0.3 0.3 - 1.2 mg/dL   GFR, Estimated >11 >91 mL/min    Comment: (NOTE) Calculated using the CKD-EPI Creatinine Equation (2021)    Anion gap 5 5 - 15    Comment: Performed at [facility], [address] [place], [place] [address]  CBC WITH DIFFERENTIAL     Status: Abnormal   Collection Time: 02/13/21  7:27 AM  Result Value Ref Range   WBC 10.6 (H) 4.0 - 10.5 K/uL   RBC 3.53 (L) 3.87 - 5.11 MIL/uL   Hemoglobin 10.8 (L) 12.0 - 15.0 g/dL   HCT 56.2 (L) 13.0 - 86.5 %   MCV 94.9 80.0 - 100.0 fL   MCH 30.6 26.0 - 34.0 pg   MCHC 32.2 30.0 - 36.0 g/dL   RDW 78.4 69.6 - 29.5 %   Platelets 284 150 - 400 K/uL   nRBC 0.0 0.0 - 0.2 %   Neutrophils Relative % 78 %   Neutro Abs 8.4 (H) 1.7 - 7.7 K/uL   Lymphocytes Relative 9 %   Lymphs Abs 0.9 0.7 - 4.0 K/uL   Monocytes Relative 6 %   Monocytes Absolute 0.6 0.1 - 1.0 K/uL   Eosinophils Relative 2 %   Eosinophils Absolute 0.3 0.0 - 0.5 K/uL   Basophils Relative 1 %   Basophils Absolute 0.1 0.0 - 0.1 K/uL   Immature Granulocytes 4 %   Abs Immature Granulocytes 0.42 (H) 0.00 - 0.07 K/uL    Comment: Performed at [facility], [address], [place], [place] [address]  Brain natriuretic peptide     Status: Abnormal   Collection  Time: 02/13/21  7:28 AM  Result Value Ref Range   B Natriuretic Peptide 256.0 (H) 0.0 - 100.0 pg/mL    Comment: Performed at [facility], [address], [place], [place] [address]  Resp Panel by RT-PCR (Flu A&B, Covid) Nasopharyngeal Swab     Status: None   Collection Time: 02/13/21 10:20 AM   Specimen: Nasopharyngeal Swab; Nasopharyngeal(NP) swabs in vial transport medium  Result Value Ref Range   SARS Coronavirus 2 by RT PCR NEGATIVE NEGATIVE    Comment: (NOTE) SARS-CoV-2 target nucleic acids are NOT DETECTED.  The SARS-CoV-2 RNA is generally detectable in upper respiratory specimens during the acute phase of infection. The lowest concentration of SARS-CoV-2 viral copies this assay can detect is 138 copies/mL. A negative result does not preclude SARS-Cov-2 infection and should not be used as the sole basis for treatment or other patient management decisions. A negative result may occur with  improper specimen collection/handling, submission of specimen other than nasopharyngeal swab, presence of viral mutation(s) within the areas targeted by this assay, and inadequate number of viral copies(<138 copies/mL). A negative result must be combined with clinical observations, patient history,  and epidemiological information. The expected result is Negative.  Fact Sheet for Patients:  BloggerCourse.com  Fact Sheet for Healthcare Providers:  SeriousBroker.it  This test is no t yet approved or cleared by the [place] FDA and  has been authorized for detection and/or diagnosis of SARS-CoV-2 by FDA under an Emergency Use Authorization (EUA). This EUA will remain  in effect (meaning this test can be used) for the duration of the COVID-19 declaration under Section 564(b)(1) of the Act, 21 U.S.C.section 360bbb-3(b)(1), unless the authorization is terminated  or revoked sooner.       Influenza A by PCR NEGATIVE NEGATIVE   Influenza B  by PCR NEGATIVE NEGATIVE    Comment: (NOTE) The Xpert Xpress SARS-CoV-2/FLU/RSV plus assay is intended as an aid in the diagnosis of influenza from Nasopharyngeal swab specimens and should not be used as a sole basis for treatment. Nasal washings and aspirates are unacceptable for Xpert Xpress SARS-CoV-2/FLU/RSV testing.  Fact Sheet for Patients: BloggerCourse.com  Fact Sheet for Healthcare Providers: SeriousBroker.it  This test is not yet approved or cleared by the [place] FDA and has been authorized for detection and/or diagnosis of SARS-CoV-2 by FDA under an Emergency Use Authorization (EUA). This EUA will remain in effect (meaning this test can be used) for the duration of the COVID-19 declaration under Section 564(b)(1) of the Act, 21 U.S.C. section 360bbb-3(b)(1), unless the authorization is terminated or revoked.  Performed at [facility], [address], [place], [place] [address]     Studies/Results:  EEG Description: No posterior dominant rhythm was seen. EEG showed continuous generalized 3 to 6 Hz theta-delta slowing. Abundant spikes were noted in left temporo-parietal region. Hyperventilation and photic stimulation were not performed.      ABNORMALITY -Spike, left temporo-parietal region - Continuous slow, generalized   IMPRESSION: This study showed evidence of epileptogenicity arising from left temporo-parietal region. There is also suggestive of moderate diffuse encephalopathy, nonspecific etiology. No seizures were seen throughout the recording.       Prolonged EEG 12/11-05/2018: EEG Interpretation: Abnormal continuous video EEG due to: 1. Sharp Waves, left temporal, maximal F7/T3. 2. Background Slow     There were no seizures or push button events.   Clinical Correlation: These findings show evidence of increased  epileptogenic potential over the left temporal head region. In  addition to these  findings, there is a diffuse underlying mild  encephalopathy.   Prolonged EEG 1/25-26/2019 (care-everywhere): Interpretation:  This is abnormal long term EEG, demonstrating  1. Left posterior region seizures 2. Lateralized, periodic discharges- left posterior region  3. Continuous and generalized slowing.  Clinical Correlation: The findings of the study indicate: 1. Seizures were seen in the left posterior region.  The last seizure was  seen at 0601 July 12, 2017. 2. Moderate global, encephalopathy of nonspecific etiology.  These  etiologies would include, yet not limit, to metabolic, infectious, toxic,  or post-ictal causes     BRAIN MRI 09-2020 CLINICAL DATA:  Acute neuro deficit.  Rule out stroke  IMPRESSION: Negative for acute infarct   Generalized atrophy with mild white matter changes consistent with chronic microvascular ischemia   Central disc protrusions at C3-4 and C4-5.    Kagan Hietpas A. Gerilyn Pilgrim, M.D.  Diplomate, Biomedical engineer of Psychiatry and Neurology ( Neurology). 02/13/2021, 5:48 PM

## 2021-02-13 NOTE — ED Notes (Signed)
Reordered EEG due to accidentally clicked it off .

## 2021-02-13 NOTE — H&P (Addendum)
History and Physical  Brooke Gross ZJQ:[id number] DOB: 08/19/1940 DOA: 02/13/2021   PCP: Galvin Proffer, MD   Patient coming from: Home  Chief Complaint: sob, hypoxia  HPI:  Brooke Gross is a 80 y.o. 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  Attempts were made to contact the facility without any answer.  History is obtained from review of the medical record and speaking with the patient's son.  Son states that he has not been notified of any recent shortness of breath, fever, chills, chest pain.  Apparently, the patient had an aspirational type event while eating breakfast.  She was noted to be hypoxic.  EMS was activated.  In addition, EMS also noted the patient to have another aspirational event in route to the hospital.  Apparently, the patient was very somnolent and minimally responsive at the time of arrival.  Since arrival from her nursing facility, has become more alert in the emergency department while receiving antibiotics. At the time of my evaluation, the patient is able to answer yes/no questions which is her baseline.  She is able to stand but not able to walk at baseline. In the emergency department, the patient had low-grade temperature 99.3 F.  She was hemodynamically stable but tachypneic.  Oxygen saturation was 98% on 2L.  Patient was started on ceftriaxone and azithromycin in the emergency department.   Assessment/Plan: Acute respiratory failure with hypoxia -due to aspiration pneumonitis -stable on 2 L -COVID-19 PCR is pending  -start unasyn -add azithro  Acute metabolic encephalopathy -Given the patient's somnolence initially which was gradually improving during her ED stay, concerned about  postictal state -Obtain EEG -Neurology consult -Check UA and urine culture  Epilepsy -Neurology consult -Continue Vimpat and Lamictal -Obtain EEG  Diabetes mellitus type 2 -NovoLog sliding scale -Check hemoglobin A1c -Holding metformin -09/22/2020 hemoglobin A1c 7.6  Essential hypertension -Continue lisinopril  Anxiety/depression -no longer on Remeron and olanzapine        Past Medical History:  Diagnosis Date   Abnormality of gait 12/15/2013   Anemia    Anxiety    Anxiety disorder    Bipolar 1 disorder (HCC)    CKD (chronic kidney disease)    Depression    Diabetes (HCC)    Dysphagia    Epilepsy (HCC)    Gait difficulty    GERD (gastroesophageal reflux disease)    Hypercholesteremia    Hyperlipemia    Hyperlipidemia    Hypertension    Immunodeficiency (HCC)    Major depressive disorder    Malaise    Migraine    NSTEMI, initial episode of care (HCC) 03/18/2014   Obese    Sciatica    Seizures (HCC)    Tremor    Vertigo, labyrinthine    Past Surgical History:  Procedure Laterality Date   CATARACT EXTRACTION Bilateral    CERVICAL SPINE SURGERY     cholecsystectomy  1977   CHOLECYSTECTOMY     COLONOSCOPY  2010   degenerative disc diease     NECK SURGERY  1990s   TONSILLECTOMY     Social History:  reports that she quit smoking about 15 years ago. Her smoking use included cigarettes. She has a 7.50 pack-year smoking history. Her smokeless tobacco use includes chew. She reports that she does  not currently use alcohol. She reports that she does not use drugs.   Family History  Problem Relation Age of Onset   Other Mother        unsure of history   Heart attack Father    Dementia Mother    Congestive Heart Failure Mother    Dementia Father      Allergies  Allergen Reactions   Tramadol     Other reaction(s): Seizures   Aspirin Hives   Aspirin Other (See Comments)    Pt reports hives with any dose above 81mg .     Prior to Admission  medications   Medication Sig Start Date End Date Taking? Authorizing Provider  acetaminophen (TYLENOL) 325 MG tablet Take 650 mg by mouth every 6 (six) hours as needed for mild pain.   Yes [provider]  aspirin EC 81 MG tablet Take 1 tablet (81 mg total) by mouth daily with breakfast. 09/22/20 09/22/21 Yes Emokpae, Courage, MD  cefdinir (OMNICEF) 300 MG capsule Take 600 mg by mouth daily.  02/14/21 Yes [provider]  Cholecalciferol (VITAMIN D-3) 125 MCG (5000 UT) TABS Take 1 tablet by mouth daily.   Yes [provider]  Lacosamide 150 MG TABS Take 1 tablet (150 mg total) by mouth 2 (two) times daily. Patient taking differently: Take 200 mg by mouth 2 (two) times daily. 09/22/20  Yes 11/22/20, MD  lamoTRIgine (LAMICTAL) 200 MG tablet Take 2 tablets (400 mg total) by mouth 2 (two) times daily. 09/22/20  Yes Emokpae, Courage, MD  lisinopril (PRINIVIL,ZESTRIL) 10 MG tablet Take 20 mg by mouth daily.   Yes [provider]  loperamide (IMODIUM A-D) 2 MG tablet Take 2 mg by mouth 4 (four) times daily as needed for diarrhea or loose stools.   Yes [provider]  LORazepam (ATIVAN) 2 MG/ML concentrated solution Take 1 mg by mouth as needed for anxiety (Inject 1mg  IM as needed for seizure.  Repeat x 1 if needed.  Send to ER for any further seizures.).   Yes [provider]  metFORMIN (GLUCOPHAGE) 500 MG tablet Take 500 mg by mouth daily with breakfast.    Yes [provider]  Multiple Vitamin (MULTIVITAMIN WITH MINERALS) TABS tablet Take 1 tablet by mouth daily.   Yes [provider]  NOVOLOG FLEXPEN 100 UNIT/ML FlexPen Inject into the skin See admin instructions. Per sliding scale 05/20/20  Yes [provider]  LORazepam (ATIVAN) 1 MG tablet Take 1 mg by mouth 3 (three) times daily. Patient not taking: Reported on 02/13/2021 06/20/20   [provider]  Melatonin 3 MG TABS Take by mouth. Patient not taking: Reported  on 02/13/2021    [provider]  mirtazapine (REMERON) 7.5 MG tablet Take 7.5 mg by mouth at bedtime. Patient not taking: Reported on 02/13/2021 07/02/20   [provider]  OLANZapine (ZYPREXA) 10 MG tablet Take 10 mg by mouth at bedtime. Patient not taking: Reported on 02/13/2021    [provider]  phenytoin (DILANTIN) 50 MG tablet Chew 150 mg by mouth at bedtime. Patient not taking: Reported on 02/13/2021    [provider]  Zinc Oxide 6 % CREA Apply 1 application topically in the morning and at bedtime. Apply to inner thighs Patient not taking: Reported on 02/13/2021    [provider]    Review of Systems:  Unobtainable due to altered mental status  Physical Exam: Vitals:   02/13/21 0730 02/13/21 0800 02/13/21 0830 02/13/21 1000  BP: 126/71 (!) 147/68 (!) 176/69 (!) 198/85  Pulse: 61 74 73 70  Resp: (!) 31 (!) 35 (!) 38 (!) 34  Temp:      TempSrc:      SpO2: 97% 98% 97% 100%  Weight:      Height:       General:  A&O x 1, NAD, nontoxic, pleasant/cooperative Head/Eye: No conjunctival hemorrhage, no icterus, [place]/AT, No nystagmus ENT:  No icterus,  No thrush, good dentition, no pharyngeal exudate Neck:  No masses, no lymphadenpathy, no bruits CV:  RRR, no rub, no gallop, no S3 Lung:  bibasilar rales. R- basilar wheeze Abdomen: soft/NT, +BS, nondistended, no peritoneal signs Ext: No cyanosis, No rashes, No petechiae, No lymphangitis, No edema Neuro: CNII-XII intact, strength 2/5 in bilateral upper and lower extremities, no dysmetria  Labs on Admission:  Basic Metabolic Panel: Recent Labs  Lab 02/13/21 0727  NA 139  K 3.6  CL 109  CO2 25  GLUCOSE 178*  BUN 13  CREATININE 0.86  CALCIUM 8.7*   Liver Function Tests: Recent Labs  Lab 02/13/21 0727  AST 16  ALT 12  ALKPHOS 74  BILITOT 0.3  PROT 6.5  ALBUMIN 2.8*   No results for input(s): LIPASE, AMYLASE in the last 168 hours. No results for input(s): AMMONIA in the last  168 hours. CBC: Recent Labs  Lab 02/13/21 0727  WBC 10.6*  NEUTROABS 8.4*  HGB 10.8*  HCT 33.5*  MCV 94.9  PLT 284   Coagulation Profile: No results for input(s): INR, PROTIME in the last 168 hours. Cardiac Enzymes: No results for input(s): CKTOTAL, CKMB, CKMBINDEX, TROPONINI in the last 168 hours. BNP: Invalid input(s): POCBNP CBG: Recent Labs  Lab 02/13/21 0726  GLUCAP 151*   Urine analysis:    Component Value Date/Time   COLORURINE STRAW (A) 09/21/2020 1923   APPEARANCEUR CLEAR 09/21/2020 1923   LABSPEC 1.009 09/21/2020 1923   PHURINE 7.0 09/21/2020 1923   GLUCOSEU NEGATIVE 09/21/2020 1923   HGBUR NEGATIVE 09/21/2020 1923   BILIRUBINUR NEGATIVE 09/21/2020 1923   KETONESUR NEGATIVE 09/21/2020 1923   PROTEINUR NEGATIVE 09/21/2020 1923   UROBILINOGEN 0.2 04/22/2014 0801   NITRITE NEGATIVE 09/21/2020 1923   LEUKOCYTESUR TRACE (A) 09/21/2020 1923   Sepsis Labs: @LABRCNTIP (procalcitonin:4,lacticidven:4) )No results found for this or any previous visit (from the past 240 hour(s)).   Radiological Exams on Admission: DG Chest Port 1 View  Result Date: 02/13/2021 CLINICAL DATA:  Aspiration EXAM: PORTABLE CHEST 1 VIEW COMPARISON:  09/21/2020 FINDINGS: Normal cardiac silhouette. No effusion, infiltrate or pneumothorax. No pneumonia or aspiration pneumonitis identified. Lordotic view. No acute osseous abnormality. IMPRESSION: No evidence pneumonia or aspiration pneumonitis. Electronically Signed   By: 11/21/2020 M.D.   On: 02/13/2021 07:54    EKG: Independently reviewed. Sinus, no STT change   Time spent:60 minutes Code Status:   DNR Family Communication: son updated on phone 8/30 Disposition Plan: expect 1-2 day hospitalization Consults called: none DVT Prophylaxis: Shelby Lovenox  9/30, DO  [facility] Pager [phone]  If 7PM-7AM, please contact night-coverage [email or web address] Password [email or web address] 02/13/2021, 10:25 AM

## 2021-02-13 NOTE — ED Notes (Signed)
EEG in progress 

## 2021-02-13 NOTE — Procedures (Signed)
Patient Name: Brooke Gross  MRN: [id number]  Epilepsy Attending: Charlsie Quest  Referring Physician/Provider: Dr Onalee Hua Tat Date: 02/13/2021 Duration: 24.51 mins  Patient history: 80yo F with h/o epilepsy and ams. EEG to evaulate for seizure.  Level of alertness: Awake  AEDs during EEG study: None  Technical aspects: This EEG study was done with scalp electrodes positioned according to the 10-20 International system of electrode placement. Electrical activity was acquired at a sampling rate of 500Hz  and reviewed with a high frequency filter of 70Hz  and a low frequency filter of 1Hz . EEG data were recorded continuously and digitally stored.   Description: No posterior dominant rhythm was seen. EEG showed continuous generalized 3 to 6 Hz theta-delta slowing. Abundant spikes were noted in left temporo-parietal region. Hyperventilation and photic stimulation were not performed.     ABNORMALITY -Spike, left temporo-parietal region - Continuous slow, generalized  IMPRESSION: This study showed evidence of epileptogenicity arising from left temporo-parietal region. There is also suggestive of moderate diffuse encephalopathy, nonspecific etiology. No seizures were seen throughout the recording.  Shalinda Burkholder 

## 2021-02-13 NOTE — ED Provider Notes (Signed)
[name] EMERGENCY DEPARTMENT Provider Note   CSN: [id number] Arrival date & time: 02/13/21  0703     History Chief Complaint  Patient presents with   Aspiration    Brooke Gross is a 80 y.o. female.  HPI Patient presents from local rehabilitation facility after staff noted concern for aspiration.  History obtained by those notes, and EMS from nursing staff.  Seemingly patient has baseline minimal interactivity due to a long history of seizures.  She is not seemingly on home oxygen.  Today after an episode of choking, patient had new oxygen requirement, requiring 4 L via nasal cannula for appropriate saturation.  Patient cannot provide any details of her history secondary to mental status limitations, level 5 caveat.      Past Medical History:  Diagnosis Date   Abnormality of gait 12/15/2013   Anemia    Anxiety    Anxiety disorder    Bipolar 1 disorder (HCC)    CKD (chronic kidney disease)    Depression    Diabetes (HCC)    Dysphagia    Epilepsy (HCC)    Gait difficulty    GERD (gastroesophageal reflux disease)    Hypercholesteremia    Hyperlipemia    Hyperlipidemia    Hypertension    Immunodeficiency (HCC)    Major depressive disorder    Malaise    Migraine    NSTEMI, initial episode of care (HCC) 03/18/2014   Obese    Sciatica    Seizures (HCC)    Tremor    Vertigo, labyrinthine     Patient Active Problem List   Diagnosis Date Noted   Bradycardia 09/22/2020   Hypothermia 09/22/2020   Hyperglycemia due to diabetes mellitus (HCC) 09/22/2020   AKI (acute kidney injury) (HCC) 09/22/2020   Hyperlipidemia 09/22/2020   GERD (gastroesophageal reflux disease) 09/22/2020   Altered mental status 09/22/2020   Cerebrovascular accident (CVA) (HCC) 09/18/2017   Seizures (HCC) 03/17/2017   Memory loss 03/17/2017   Gait abnormality 03/17/2017   Seizure disorder (HCC) 04/22/2014   Anxiety and depression 04/22/2014   Acute respiratory failure with hypoxia  (HCC) 04/13/2014   Severe sepsis with septic shock (HCC) 04/13/2014   Anemia, iron deficiency 04/13/2014   Essential hypertension    Status epilepticus (HCC) 03/18/2014   Non-ST elevation myocardial infarction (NSTEMI), subsequent episode of care (HCC) 03/18/2014   Diabetes mellitus type 2, controlled, without complications (HCC)    Abnormality of gait 12/15/2013    Past Surgical History:  Procedure Laterality Date   CATARACT EXTRACTION Bilateral    CERVICAL SPINE SURGERY     cholecsystectomy  1977   CHOLECYSTECTOMY     COLONOSCOPY  2010   degenerative disc diease     NECK SURGERY  1990s   TONSILLECTOMY       OB History   No obstetric history on file.     Family History  Problem Relation Age of Onset   Other Mother        unsure of history   Heart attack Father    Dementia Mother    Congestive Heart Failure Mother    Dementia Father     Social History   Tobacco Use   Smoking status: Former    Packs/day: 0.50    Years: 15.00    Pack years: 7.50    Types: Cigarettes    Quit date: 2007    Years since quitting: 15.6   Smokeless tobacco: Current    Types: Chew   Tobacco  comments:    quit 1977  Vaping Use   Vaping Use: Never used  Substance Use Topics   Alcohol use: Not Currently    Comment: occasional   Drug use: No    Home Medications Prior to Admission medications   Medication Sig Start Date End Date Taking? Authorizing Provider  acetaminophen (TYLENOL) 325 MG tablet Take 650 mg by mouth every 6 (six) hours as needed for mild pain.    [provider]  aspirin EC 81 MG tablet Take 1 tablet (81 mg total) by mouth daily with breakfast. 09/22/20 09/22/21  Shon Hale, MD  Cholecalciferol (VITAMIN D-3) 125 MCG (5000 UT) TABS Take 1 tablet by mouth daily.    [provider]  Lacosamide 150 MG TABS Take 1 tablet (150 mg total) by mouth 2 (two) times daily. 09/22/20   Shon Hale, MD  lamoTRIgine (LAMICTAL) 200 MG tablet Take 2 tablets (400  mg total) by mouth 2 (two) times daily. 09/22/20   Shon Hale, MD  lisinopril (PRINIVIL,ZESTRIL) 10 MG tablet Take 10 mg by mouth daily.    [provider]  loperamide (IMODIUM A-D) 2 MG tablet Take 2 mg by mouth 4 (four) times daily as needed for diarrhea or loose stools.    [provider]  LORazepam (ATIVAN) 1 MG tablet Take 1 mg by mouth 3 (three) times daily. 06/20/20   [provider]  LORazepam (ATIVAN) 2 MG/ML concentrated solution Take 1 mg by mouth as needed for anxiety (Inject 1mg  IM as needed for seizure.  Repeat x 1 if needed.  Send to ER for any further seizures.).    [provider]  Melatonin 3 MG TABS Take by mouth.    [provider]  metFORMIN (GLUCOPHAGE) 500 MG tablet Take 500 mg by mouth daily with breakfast.     [provider]  mirtazapine (REMERON) 7.5 MG tablet Take 7.5 mg by mouth at bedtime. 07/02/20   [provider]  NOVOLOG FLEXPEN 100 UNIT/ML FlexPen Inject into the skin See admin instructions. Per sliding scale 05/20/20   [provider]  OLANZapine (ZYPREXA) 10 MG tablet Take 10 mg by mouth at bedtime.    [provider]  phenytoin (DILANTIN) 50 MG tablet Chew 150 mg by mouth at bedtime.    [provider]  Zinc Oxide 6 % CREA Apply 1 application topically in the morning and at bedtime. Apply to inner thighs    [provider]    Allergies    Tramadol, Aspirin, and Aspirin  Review of Systems   Review of Systems  Unable to perform ROS: Patient nonverbal   Physical Exam Updated Vital Signs BP 126/71   Pulse 61   Temp 99.3 F (37.4 C) (Rectal)   Resp (!) 31   Ht 5\' 6"  (1.676 m)   Wt 81.6 kg   SpO2 97%   BMI 29.05 kg/m   Physical Exam Vitals and nursing note reviewed.  Constitutional:      Appearance: She is well-developed. She is ill-appearing.  HENT:     Head: Normocephalic and atraumatic.  Eyes:     Conjunctiva/sclera: Conjunctivae normal.   Cardiovascular:     Rate and Rhythm: Regular rhythm. Tachycardia present.  Pulmonary:     Effort: Tachypnea and accessory muscle usage present. No respiratory distress.     Breath sounds: No stridor.  Abdominal:     General: There is no distension.  Skin:    General: Skin is warm and dry.  Neurological:     Cranial Nerves: No cranial nerve deficit.     Motor: Atrophy present.     Comments: Minimally interactive, does respond to stimuli  Psychiatric:        Cognition and Memory: Cognition is impaired. Memory is impaired.    ED Results / Procedures / Treatments   Labs (all labs ordered are listed, but only abnormal results are displayed) Labs Reviewed  COMPREHENSIVE METABOLIC PANEL - Abnormal; Notable for the following components:      Result Value   Glucose, Bld 178 (*)    Calcium 8.7 (*)    Albumin 2.8 (*)    All other components within normal limits  CBC WITH DIFFERENTIAL/PLATELET - Abnormal; Notable for the following components:   WBC 10.6 (*)    RBC 3.53 (*)    Hemoglobin 10.8 (*)    HCT 33.5 (*)    Neutro Abs 8.4 (*)    Abs Immature Granulocytes 0.42 (*)    All other components within normal limits  CBG MONITORING, ED - Abnormal; Notable for the following components:   Glucose-Capillary 151 (*)    All other components within normal limits  BRAIN NATRIURETIC PEPTIDE    EKG EKG Interpretation  Date/Time:  Tuesday February 13 2021 07:10:37 EDT Ventricular Rate:  68 PR Interval:  171 QRS Duration: 104 QT Interval:  409 QTC Calculation: 435 R Axis:   56 Text Interpretation: Sinus rhythm Artifact Otherwise within normal limits Confirmed by Gerhard Munch [phone]) on 02/13/2021 7:32:16 AM  Radiology DG Chest Port 1 View  Result Date: 02/13/2021 CLINICAL DATA:  Aspiration EXAM: PORTABLE CHEST 1 VIEW COMPARISON:  09/21/2020 FINDINGS: Normal cardiac silhouette. No effusion, infiltrate or pneumothorax. No pneumonia or aspiration pneumonitis identified. Lordotic view.  No acute osseous abnormality. IMPRESSION: No evidence pneumonia or aspiration pneumonitis. Electronically Signed   By: Genevive Bi M.D.   On: 02/13/2021 07:54    Procedures Procedures   Medications Ordered in ED Medications - No data to display  ED Course  I have reviewed the triage vital signs and the nursing notes.  Pertinent labs & imaging results that were available during my care of the patient were reviewed by me and considered in my medical decision making (see chart for details).   9:06 AM Patient remains tachypneic, though more comfortable on initial exam.  She is now verbalizing, though not meaningfully, consistently. She continues to require 4 L via nasal cannula, saturation 97%.  I reviewed her labs, x-ray, and with concern for new oxygen requirement, suspicion for pneumonia, she is starting antibiotics, will require admission.  I attempted to speak with her son, but that number was not functioning. Labs consistent with early infection with leukocytosis, and increased absolute granulocyte count, though the initial x-ray does not obvious for pneumonia, clinical picture is concerning for this.  COVID pending on admission. MDM Rules/Calculators/A&P MDM Number of Diagnoses or Management Options Hypoxia: new, needed workup   Amount and/or Complexity of Data Reviewed Clinical lab tests: ordered and reviewed Tests in the radiology section of CPT: ordered and reviewed Tests in the medicine section of CPT: reviewed and ordered Decide to obtain previous medical records or to obtain history from someone other than the patient: yes Obtain history from someone other than the patient: yes Review and summarize past medical records: yes Discuss the patient with other providers: yes Independent visualization of images, tracings, or specimens: yes  Risk of Complications, Morbidity, and/or Mortality Presenting problems: high Diagnostic procedures: high Management options:  high  Critical Care Total time providing critical care: < 30 minutes  Patient Progress Patient progress: stable   Final Clinical Impression(s) / ED Diagnoses Final diagnoses:  Hypoxia     Gerhard Munch, MD 02/13/21 [phone]

## 2021-02-13 NOTE — Progress Notes (Addendum)
Responded to nursing call:  patient with aspirational event -patient away without tonic clonic activity   Subjective:   Vitals:   02/13/21 1330 02/13/21 1400 02/13/21 1430 02/13/21 1645  BP: (!) 161/66 137/64 (!) 152/81 (!) 164/75  Pulse: 65 (!) 58 68 69  Resp: (!) 25 (!) 24 19 20   Temp:    99.1 F (37.3 C)  TempSrc:      SpO2: 99% 100% 99% 100%  Weight:      Height:       CV--RRR Lung--bilateral rhonchi Abd--soft+BS/NT   Assessment/Plan: Respiratory Distress -pt had part vomitus and part coughing up sputum -having difficulty handling secretions -make NPO except for essential meds -speech study ordered -transfer to SDU -ABG  7.36/43/62/23 (0.44) -CXR--personally reviewed--unchanged bronchitic markings -start BDs -consult palliative -lasix 40 mg IV x 1   Seizure disorder/epilepsy -neurology already consulted -EEG with suggestion of epileptogenicity arising from left temporo-parietal region -defer additional AEDs to neurology   I have updated son.  He states he does not want any heroic or invasive measures.  He wants to continue IVF, abx, oxygen, nebs.  If no improvement or worsening in next 24-48 hours, would consider full comfort care.  He also stated that patient was just released from [facility] last week after tx for sepsis from UTI   The patient is critically ill with multiple organ systems failure and requires high complexity decision making for assessment and support, frequent evaluation and titration of therapies, application of advanced monitoring technologies and extensive interpretation of multiple databases.  Critical care time - 35 mins.    [facility], DO [facility]

## 2021-02-13 NOTE — ED Triage Notes (Signed)
Pt arrives from [facility] after possible aspiration. Pt also aspirated with EMS.

## 2021-02-13 NOTE — ED Notes (Signed)
Pt more responsive when cool washcloth on face, Pt opened eyes while lab drew blood, Pt coughed and gagged during suctioning yellow thick mucous.

## 2021-02-13 NOTE — Progress Notes (Addendum)
Received NT suction order.  Gathered supplies and went in to do procedure on patient.  Patient tolerated fairly well and began to cough harder and was able to bring up a ball of secretions about 2in., extremely thick and yellow in color.  BS are better and patient appears more comfortable.  RN notified and came in at end of procedure to see results.

## 2021-02-13 NOTE — Progress Notes (Signed)
Pt arrived to unit alert, unable to speak, nods occasionally to questions, unable to assess orientation. Follows some commands, generalized rigidity- see Neuro assessment. Neurologist at bedside. Orders to follow. VS -98% on 3L [place], Resp 24, NSR- HR 66, BP 179/82, T 98.3 axillary. Suction set up at bedside. Suctioned med amount of yellow sputum from pt's mouth with yaunker. Pt has weak cough. RT in room assessing pt. Night shift RN -Jill Alexanders assuming care

## 2021-02-13 NOTE — Progress Notes (Signed)
EEG complete - results pending 

## 2021-02-14 ENCOUNTER — Encounter (HOSPITAL_COMMUNITY): Payer: Self-pay | Admitting: Internal Medicine

## 2021-02-14 DIAGNOSIS — Z7189 Other specified counseling: Secondary | ICD-10-CM

## 2021-02-14 DIAGNOSIS — R0902 Hypoxemia: Secondary | ICD-10-CM

## 2021-02-14 DIAGNOSIS — Z515 Encounter for palliative care: Secondary | ICD-10-CM

## 2021-02-14 LAB — MAGNESIUM: Magnesium: 1.3 mg/dL — ABNORMAL LOW (ref 1.7–2.4)

## 2021-02-14 LAB — CBC
HCT: 30.7 % — ABNORMAL LOW (ref 36.0–46.0)
Hemoglobin: 10 g/dL — ABNORMAL LOW (ref 12.0–15.0)
MCH: 30.9 pg (ref 26.0–34.0)
MCHC: 32.6 g/dL (ref 30.0–36.0)
MCV: 94.8 fL (ref 80.0–100.0)
Platelets: 278 10*3/uL (ref 150–400)
RBC: 3.24 MIL/uL — ABNORMAL LOW (ref 3.87–5.11)
RDW: 13.7 % (ref 11.5–15.5)
WBC: 13.2 10*3/uL — ABNORMAL HIGH (ref 4.0–10.5)
nRBC: 0 % (ref 0.0–0.2)

## 2021-02-14 LAB — BASIC METABOLIC PANEL
Anion gap: 8 (ref 5–15)
BUN: 9 mg/dL (ref 8–23)
CO2: 27 mmol/L (ref 22–32)
Calcium: 8.6 mg/dL — ABNORMAL LOW (ref 8.9–10.3)
Chloride: 108 mmol/L (ref 98–111)
Creatinine, Ser: 0.82 mg/dL (ref 0.44–1.00)
GFR, Estimated: >60 mL/min (ref >60)
Glucose, Bld: 146 mg/dL — ABNORMAL HIGH (ref 70–99)
Potassium: 3.6 mmol/L (ref 3.5–5.1)
Sodium: 143 mmol/L (ref 135–145)

## 2021-02-14 LAB — URINALYSIS, COMPLETE (UACMP) WITH MICROSCOPIC
Bilirubin Urine: NEGATIVE
Glucose, UA: NEGATIVE mg/dL
Ketones, ur: NEGATIVE mg/dL
Leukocytes,Ua: NEGATIVE
Nitrite: NEGATIVE
Protein, ur: NEGATIVE mg/dL
Specific Gravity, Urine: 1.008 (ref 1.005–1.030)
pH: 5 (ref 5.0–8.0)

## 2021-02-14 MED ORDER — LEVETIRACETAM IN NACL 500 MG/100ML IV SOLN
500.0000 mg | Freq: Three times a day (TID) | INTRAVENOUS | Status: DC
  Administered 2021-02-14 – 2021-02-16 (×5): 500 mg via INTRAVENOUS
  Filled 2021-02-14 (×6): qty 100

## 2021-02-14 MED ORDER — MAGNESIUM SULFATE 4 GM/100ML IV SOLN
4.0000 g | Freq: Once | INTRAVENOUS | Status: AC
  Administered 2021-02-14: 4 g via INTRAVENOUS
  Filled 2021-02-14: qty 100

## 2021-02-14 MED ORDER — FOOD THICKENER (SIMPLYTHICK): 1.0000 | ORAL | Status: DC | PRN

## 2021-02-14 MED ORDER — LACOSAMIDE 200 MG/20ML IV SOLN
INTRAVENOUS | Status: AC
  Filled 2021-02-14: qty 20

## 2021-02-14 NOTE — Evaluation (Signed)
Clinical/Bedside Swallow Evaluation Patient Details  Name: Brooke Gross MRN: [id number] Date of Birth: Apr 27, 1941  Today's Date: 02/14/2021 Time: SLP Start Time (ACUTE ONLY): 0855 SLP Stop Time (ACUTE ONLY): 0926 SLP Time Calculation (min) (ACUTE ONLY): 31 min  Past Medical History:  Past Medical History:  Diagnosis Date   Abnormality of gait 12/15/2013   Anemia    Anxiety    Anxiety disorder    Bipolar 1 disorder (HCC)    CKD (chronic kidney disease)    Depression    Diabetes (HCC)    Dysphagia    Epilepsy (HCC)    Gait difficulty    GERD (gastroesophageal reflux disease)    Hypercholesteremia    Hyperlipemia    Hyperlipidemia    Hypertension    Immunodeficiency (HCC)    Major depressive disorder    Malaise    Migraine    NSTEMI, initial episode of care (HCC) 03/18/2014   Obese    Sciatica    Seizures (HCC)    Tremor    Vertigo, labyrinthine    Past Surgical History:  Past Surgical History:  Procedure Laterality Date   CATARACT EXTRACTION Bilateral    CERVICAL SPINE SURGERY     cholecsystectomy  1977   CHOLECYSTECTOMY     COLONOSCOPY  2010   degenerative disc diease     NECK SURGERY  1990s   TONSILLECTOMY     HPI:  Brooke Gross is a 80 y.o. 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  Attempts were made to contact the facility without any answer.  History is obtained from review of the medical record and speaking with the patient's son.  Son states that he has not been notified of any recent shortness of breath, fever, chills, chest pain.  Apparently, the patient had an aspirational type event while eating breakfast.  She was noted to be hypoxic.  EMS was activated.  In addition, EMS also noted  the patient to have another aspirational event in route to the hospital. BSE requested. Pt had BSE in 2015. She has been residing at the [facility] in [place] and was recently discharged from [facility]. RT suctioned last night and Pt with improved alertness following.   Assessment / Plan / Recommendation Clinical Impression  Clinical swallow evaluation completed at bedside. Pt alert, cooperative, and talkative but often perseverative. Pt able to tell me her birthday ([birth year]), however then responded to nearly all addtional questions with the same response. When given binary choices, she was able to say that she was in the hospital. She initially indicated she only had a daughter, named Lupita Leash, but then when cued for "[name]", she stated she had a son. Pt with clear vocal quality. She consumed ice chips, thin water via cup/straw, NTL via straw, puree, and soft solids. Pt with immediate cough on 3/4 trials of thin liquids, however vocal quality remained clear. Pt with reduced attention to masticating solids and resulted in prolonged oral transit times. No overt signs of aspiration with NTL. Will initiate D2 and NTL with po medications whole in puree when Pt is alert, cooperative, and upright. Pt will need assist for oral care. SLP will follow during acute stay and consider MBSS if indicated. Above discussed with RN and Brooke Gross, Palliative. SLP Visit Diagnosis: Dysphagia, unspecified (R13.10)  Aspiration Risk  Mild aspiration risk    Diet Recommendation Dysphagia 2 (Fine chop);Nectar-thick liquid   Liquid Administration via: Straw;Cup Medication Administration: Whole meds with puree Supervision: Staff to assist with self feeding Compensations: Slow rate;Small sips/bites Postural Changes: Seated upright at 90 degrees;Remain upright for at least 30 minutes after po intake    Other  Recommendations Oral Care Recommendations: Oral care BID;Staff/trained caregiver to provide oral  care Other Recommendations: Prohibited food (jello, ice cream, thin soups);Clarify dietary restrictions;Order thickener from pharmacy   Follow up Recommendations 24 hour supervision/assistance      Frequency and Duration min 2x/week  1 week       Prognosis Prognosis for Safe Diet Advancement: Good Barriers to Reach Goals: Cognitive deficits      Swallow Study   General Date of Onset: 02/13/21 HPI: Brooke Gross is a 80 y.o. 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  Attempts were made to contact the facility without any answer.  History is obtained from review of the medical record and speaking with the patient's son.  Son states that he has not been notified of any recent shortness of breath, fever, chills, chest pain.  Apparently, the patient had an aspirational type event while eating breakfast.  She was noted to be hypoxic.  EMS was activated.  In addition, EMS also noted the patient to have another aspirational event in route to the hospital. BSE requested. Pt had BSE in 2015. She has been residing at the [facility] in [place] and was recently discharged from [facility]. RT suctioned last night and Pt with improved alertness following. Type of Study: Bedside Swallow Evaluation Previous Swallow Assessment: BSE 2015 NTL Diet Prior to this Study: Regular;Thin liquids Temperature Spikes Noted: No Respiratory Status: Nasal cannula History of Recent Intubation: No Behavior/Cognition: Alert;Cooperative;Confused (perseverates on November 24, her bday) Oral Cavity Assessment: Dry Oral Care Completed by SLP: Yes Oral Cavity - Dentition: Adequate natural dentition Vision: Impaired for self-feeding Self-Feeding  Abilities: Total assist Patient Positioning: Upright in bed Baseline Vocal Quality: Normal Volitional Cough: Cognitively unable to elicit Volitional Swallow: Unable to elicit    Oral/Motor/Sensory Function Overall Oral Motor/Sensory Function: Within functional limits   Ice Chips Ice chips: Within functional limits Presentation: Spoon   Thin Liquid Thin Liquid: Impaired Presentation: Cup;Straw Pharyngeal  Phase Impairments: Cough - Immediate    Nectar Thick Nectar Thick Liquid: Within functional limits Presentation: Straw   Honey Thick Honey Thick Liquid: Not tested   Puree Puree: Within functional limits Presentation: Spoon   Solid     Solid: Impaired Presentation: Spoon Oral Phase Impairments: Impaired mastication Oral Phase Functional Implications: Prolonged oral transit     Thank you,  Havery Moros, CCC-SLP [phone]  Jackob Crookston 02/14/2021,9:40 AM

## 2021-02-14 NOTE — Progress Notes (Signed)
PROGRESS NOTE   Arihanna Marinus MawBoswell Choquette  BJY:[id number] DOB: April 07, 1941 DOA: 02/13/2021 PCP: Galvin ProfferHague, Imran P, MD   Chief Complaint  Patient presents with   Aspiration   Level of care: Stepdown  Brief Admission History:  80 y.o. 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  Attempts were made to contact the facility without any answer.  History is obtained from review of the medical record and speaking with the patient's son.  Son states that he has not been notified of any recent shortness of breath, fever, chills, chest pain.  Apparently, the patient had an aspirational type event while eating breakfast.  She was noted to be hypoxic.  EMS was activated.  In addition, EMS also noted the patient to have another aspirational event in route to the hospital.   Apparently, the patient was very somnolent and minimally responsive at the time of arrival.  Since arrival from her nursing facility, has become more alert in the emergency department while receiving antibiotics.  At the time of my evaluation, the patient is able to answer yes/no questions which is her baseline.  She is able to stand but not able to walk at baseline.  In the emergency department, the patient had low-grade temperature 99.3 F.  She was hemodynamically stable but tachypneic.  Oxygen saturation was 98% on 2L.  Patient was started on ceftriaxone and azithromycin in the emergency department.    Assessment & Plan:   Active Problems:   Acute respiratory failure with hypoxia (HCC)   Seizures (HCC)   Aspiration pneumonitis (HCC)   Acute metabolic encephalopathy   Respiratory distress   Acute encephalopathy -this is likely multifactorial finding given metabolic abnormalities and  respiratory failure.  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  Epilepsy-poorly controlled appreciate neurology consultation and recommendations likely plan repeat EEG 02/15/2021  Type 2 diabetes mellitus-continue SSI coverage and CBG monitoring.  Essential hypertension-stable and controlled on home medication.  Aspiration pneumonia-appreciate SLP evaluation and dysphagia diet recommendations continue IV Unasyn and azithromycin as ordered.  Anxiety and depression-patient currently is no longer on Remeron or olanzapine   DVT prophylaxis: enoxaparin  Code Status: DNR  Family Communication: son for palliative discussion Disposition: anticipate return to SNF. Status is: Inpatient  Remains inpatient appropriate because:IV treatments appropriate due to intensity of illness or inability to take PO and Inpatient level of care appropriate due to severity of illness  Dispo: The patient is from: SNF              Anticipated d/c is to: SNF              Patient currently is not medically stable to d/c.   Difficult to place patient No  Consultants:  Palliative care   Procedures:    Antimicrobials:     Subjective: Somnolent, poorly responsive this morning.  Objective: Vitals:   02/14/21 1100 02/14/21 1111 02/14/21 1200 02/14/21 1612  BP: (!) 144/56  (!) 100/59   Pulse: (!) 52 (!) 55 (!) 55 (!) 46  Resp: 15 (!) 21 19 16   Temp:  98.2 F (36.8 C)  97.8 F (36.6 C)  TempSrc:  Oral  Oral  SpO2: 99% 100% 97% 99%  Weight:      Height:        Intake/Output Summary (Last 24 hours) at 02/14/2021  1615 Last data filed at 02/13/2021 2343 Gross per 24 hour  Intake 343.96 ml  Output 1700 ml  Net -1356.04 ml   Filed Weights   02/13/21 0714  Weight: 81.6 kg   Examination:  General exam: Pt somnolent, not arousing, Appears calm and comfortable  Respiratory system: Clear to auscultation. Respiratory effort normal. Cardiovascular system: normal S1 & S2 heard. No JVD, murmurs, rubs,  gallops or clicks. No pedal edema. Gastrointestinal system: Abdomen is nondistended, soft and nontender. No organomegaly or masses felt. Normal bowel sounds heard. Central nervous system: somnolent. No focal neurological deficits. Extremities: Symmetric 5 x 5 power. Skin: No gross lesions seen.  Psychiatry: Judgement and insight appear poor. Mood & affect UTD.   Data Reviewed: I have personally reviewed following labs and imaging studies  CBC: Recent Labs  Lab 02/13/21 0727 02/14/21 0440  WBC 10.6* 13.2*  NEUTROABS 8.4*  --   HGB 10.8* 10.0*  HCT 33.5* 30.7*  MCV 94.9 94.8  PLT 284 278    Basic Metabolic Panel: Recent Labs  Lab 02/13/21 0727 02/14/21 0440  NA 139 143  K 3.6 3.6  CL 109 108  CO2 25 27  GLUCOSE 178* 146*  BUN 13 9  CREATININE 0.86 0.82  CALCIUM 8.7* 8.6*  MG  --  1.3*    GFR: Estimated Creatinine Clearance: 60.9 mL/min (by C-G formula based on SCr of 0.82 mg/dL).  Liver Function Tests: Recent Labs  Lab 02/13/21 0727  AST 16  ALT 12  ALKPHOS 74  BILITOT 0.3  PROT 6.5  ALBUMIN 2.8*    CBG: Recent Labs  Lab 02/13/21 0726  GLUCAP 151*    Recent Results (from the past 240 hour(s))  Resp Panel by RT-PCR (Flu A&B, Covid) Nasopharyngeal Swab     Status: None   Collection Time: 02/13/21 10:20 AM   Specimen: Nasopharyngeal Swab; Nasopharyngeal(NP) swabs in vial transport medium  Result Value Ref Range Status   SARS Coronavirus 2 by RT PCR NEGATIVE NEGATIVE Final    Comment: (NOTE) SARS-CoV-2 target nucleic acids are NOT DETECTED.  The SARS-CoV-2 RNA is generally detectable in upper respiratory specimens during the acute phase of infection. The lowest concentration of SARS-CoV-2 viral copies this assay can detect is 138 copies/mL. A negative result does not preclude SARS-Cov-2 infection and should not be used as the sole basis for treatment or other patient management decisions. A negative result may occur with  improper specimen  collection/handling, submission of specimen other than nasopharyngeal swab, presence of viral mutation(s) within the areas targeted by this assay, and inadequate number of viral copies(<138 copies/mL). A negative result must be combined with clinical observations, patient history, and epidemiological information. The expected result is Negative.  Fact Sheet for Patients:  BloggerCourse.com  Fact Sheet for Healthcare Providers:  SeriousBroker.it  This test is no t yet approved or cleared by the [place] FDA and  has been authorized for detection and/or diagnosis of SARS-CoV-2 by FDA under an Emergency Use Authorization (EUA). This EUA will remain  in effect (meaning this test can be used) for the duration of the COVID-19 declaration under Section 564(b)(1) of the Act, 21 U.S.C.section 360bbb-3(b)(1), unless the authorization is terminated  or revoked sooner.       Influenza A by PCR NEGATIVE NEGATIVE Final   Influenza B by PCR NEGATIVE NEGATIVE Final    Comment: (NOTE) The Xpert Xpress SARS-CoV-2/FLU/RSV plus assay is intended as an aid in the diagnosis of influenza from Nasopharyngeal swab specimens  and should not be used as a sole basis for treatment. Nasal washings and aspirates are unacceptable for Xpert Xpress SARS-CoV-2/FLU/RSV testing.  Fact Sheet for Patients: BloggerCourse.com  Fact Sheet for Healthcare Providers: SeriousBroker.it  This test is not yet approved or cleared by the [place] FDA and has been authorized for detection and/or diagnosis of SARS-CoV-2 by FDA under an Emergency Use Authorization (EUA). This EUA will remain in effect (meaning this test can be used) for the duration of the COVID-19 declaration under Section 564(b)(1) of the Act, 21 U.S.C. section 360bbb-3(b)(1), unless the authorization is terminated or revoked.  Performed at [facility], [address] [place], [place] [address]      Radiology Studies: DG CHEST PORT 1 VIEW  Result Date: 02/13/2021 CLINICAL DATA:  Respiratory distress. EXAM: PORTABLE CHEST 1 VIEW COMPARISON:  Same day. FINDINGS: The heart size and mediastinal contours are within normal limits. No pneumothorax is noted. Mild bibasilar subsegmental atelectasis is noted with probable small right pleural effusion. The visualized skeletal structures are unremarkable. IMPRESSION: Mild bibasilar subsegmental atelectasis with probable small right pleural effusion. Electronically Signed   By: Lupita Raider M.D.   On: 02/13/2021 18:53   DG Chest Port 1 View  Result Date: 02/13/2021 CLINICAL DATA:  Aspiration EXAM: PORTABLE CHEST 1 VIEW COMPARISON:  09/21/2020 FINDINGS: Normal cardiac silhouette. No effusion, infiltrate or pneumothorax. No pneumonia or aspiration pneumonitis identified. Lordotic view. No acute osseous abnormality. IMPRESSION: No evidence pneumonia or aspiration pneumonitis. Electronically Signed   By: Genevive Bi M.D.   On: 02/13/2021 07:54   EEG adult  Result Date: 02/13/2021 Charlsie Quest, MD     02/13/2021  4:56 PM Patient Name: Neave Lenger MRN: [id number] Epilepsy Attending: Charlsie Quest Referring Physician/Provider: Dr Onalee Hua Tat Date: 02/13/2021 Duration: 24.51 mins Patient history: 80yo F with h/o epilepsy and ams. EEG to evaulate for seizure. Level of alertness: Awake AEDs during EEG study: None Technical aspects: This EEG study was done with scalp electrodes positioned according to the 10-20 International system of electrode placement. Electrical activity was acquired at a sampling rate of 500Hz  and reviewed with a high frequency filter of 70Hz  and a low frequency filter of 1Hz . EEG data were recorded continuously and digitally stored. Description: No posterior dominant rhythm was seen. EEG showed continuous generalized 3 to 6 Hz theta-delta slowing. Abundant spikes were  noted in left temporo-parietal region. Hyperventilation and photic stimulation were not performed.   ABNORMALITY -Spike, left temporo-parietal region - Continuous slow, generalized IMPRESSION: This study showed evidence of epileptogenicity arising from left temporo-parietal region. There is also suggestive of moderate diffuse encephalopathy, nonspecific etiology. No seizures were seen throughout the recording. Priyanka    Scheduled Meds:  aspirin  300 mg Rectal Daily   carbidopa-levodopa  1 tablet Oral BID   enoxaparin (LOVENOX) injection  40 mg Subcutaneous Q24H   ipratropium-albuterol  3 mL Nebulization Q6H   lamoTRIgine  400 mg Oral BID   Continuous Infusions:  ampicillin-sulbactam (UNASYN) IV 3 g (02/14/21 0943)   azithromycin 500 mg (02/14/21 0934)   lacosamide (VIMPAT) IV 200 mg (02/14/21 1029)   levETIRAcetam       LOS: 1 day   Time spent: 38 mins   Kelsa Jaworowski 02/16/21, MD How to contact the [facility] Attending or Consulting provider 7A - 7P or covering provider during after hours 7P -7A, for this patient?  Check the care team in [facility] and look for a) attending/consulting TRH provider listed  and b) the [facility] team listed Log into [email or web address] and use [place]'s universal password to access. If you do not have the password, please contact the hospital operator. Locate the [facility] provider you are looking for under [facility] and page to a number that you can be directly reached. If you still have difficulty reaching the provider, please page the [facility] (Director on Call) for the Hospitalists listed on amion for assistance.  02/14/2021, 4:15 PM

## 2021-02-14 NOTE — Progress Notes (Signed)
[facility] Brooke Gross A. Brooke Pilgrimoonquah, MD     [email or web address]          Brooke Gross is an 80 y.o. female.   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 On Sinemet and dose titrated. INTRACTABLE EPILEPSY: Keppra initiated. We will continue with Lamictal and the Vimpat. 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 I suggested another spot EEG be done after with made changes to her medications. 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   If not improved with appropriate medications, consider an imaging. MRI will be preferred if possible.   The patient remains unchanged for the most part. She continues to be severely parkinsonian and stuporous at times. She did have a swallowing evaluation and appropriate diet has been recommended so she can take her medications. NG tube therefore was not placed addition it appears that the family does not want this.    GENERAL:  She has severe psychomotor slowing.  HEENT:  No trauma noted. There is severe axial rigidity. There is increased secretions at times.  ABDOMEN: soft  EXTREMITIES: No edema   BACK: Normal  SKIN: Normal by inspection.    MENTAL STATUS:  She is awake and alert. She at tends a and traction focus and attempts to follow commands infrequently. She is mute.  CRANIAL NERVES: Pupils are equal, round and reactive to light and accomodation; extra ocular movements are full, there is no significant nystagmus; visual fields are full; upper and lower facial muscles are normal in strength and symmetric, there is no flattening of the nasolabial folds; tongue is midline; uvula is midline; shoulder elevation is normal.  MOTOR:  She has antigravity strength. There is severe appendicular rigidity.  COORDINATION:  There is  intermittent rest tremor bilaterally much more on the right side. There is severe bradykinesia, cogwheeling and rigidity. Rigidity is somewhat more on the left side.       BAPTIST NOTE 06-2018 REASON FOR CONSULT/CHIEF COMPLAINT: Epilepsy with history of status epilepticus, cognitive impairment, stroke  HPI: Pt is a 80 y.o. female with pertinent PMH significant for hypertension, hyperlipidemia, diabetes, epilepsy,? Stroke (06/2017) who presents to clinic from SNF transported on a stretcher with a representative from the nursing home. History obtained by a combination of chart review, discussion with the son on the phone, discussion with [facility] Dr[facility] Doyne KeelParsons.  In brief, per my discussions the following approximate time course of events occurred regarding her history of seizures: - Onset: 202015 (son endorses that he has spoken on the phone with her and then later on could not get a hold her neither "other family members so they called EMS who went to do a check and found her unresponsive. She was then found to be in status epilepticus and reportedly placed in a coma. Son denies any known history of seizures prior to that time) -Risk factors: No febrile seizure, no known head trauma, no CNS infection, no family history of seizures -Semiology: 1. Per outside notes she has previously had right sided weakness and aphasia (2015, 06/2017) associated with seizures.  2. Will have staring and mumbling and intermittent jerking per SNF notes.  - Prior AED: LEV  - current AED: LCM, PHT, LTG   Dr. Doyne KeelParsons notes that she was [facility] but they were trying to decrease her AEDs and after each time  she went into status epilepticus. She most recently was hospitalized in December 2019 again after her medication doses were decreased. Her most recent seizure based on the SNF documentation was 06/11/2018 and Dr. Doyne Keel notes that her dose of lamotrigine was recently  increased.        Blood pressure (!) 100/59, pulse (!) 46, temperature 97.8 F (36.6 C), temperature source Oral, resp. rate 16, height 5\' 6"  (1.676 m), weight 81.6 kg, SpO2 99 %.  Past Medical History:  Diagnosis Date   Abnormality of gait 12/15/2013   Anemia    Anxiety    Anxiety disorder    Bipolar 1 disorder (HCC)    CKD (chronic kidney disease)    Depression    Diabetes (HCC)    Dysphagia    Epilepsy (HCC)    Gait difficulty    GERD (gastroesophageal reflux disease)    Hypercholesteremia    Hyperlipemia    Hyperlipidemia    Hypertension    Immunodeficiency (HCC)    Major depressive disorder    Malaise    Migraine    NSTEMI, initial episode of care (HCC) 03/18/2014   Obese    Sciatica    Seizures (HCC)    Tremor    Vertigo, labyrinthine     Past Surgical History:  Procedure Laterality Date   CATARACT EXTRACTION Bilateral    CERVICAL SPINE SURGERY     cholecsystectomy  1977   CHOLECYSTECTOMY     COLONOSCOPY  2010   degenerative disc diease     NECK SURGERY  1990s   TONSILLECTOMY      Family History  Problem Relation Age of Onset   Other Mother        unsure of history   Heart attack Father    Dementia Mother    Congestive Heart Failure Mother    Dementia Father     Social History:  reports that she quit smoking about 15 years ago. Her smoking use included cigarettes. She has a 7.50 pack-year smoking history. Her smokeless tobacco use includes chew. She reports that she does not currently use alcohol. She reports that she does not use drugs.  Allergies:  Allergies  Allergen Reactions   Tramadol     Other reaction(s): Seizures   Aspirin Hives   Aspirin Other (See Comments)    Pt reports hives with any dose above 81mg .    Medications: Prior to Admission medications   Medication Sig Start Date End Date Taking? Authorizing Provider  acetaminophen (TYLENOL) 325 MG tablet Take 650 mg by mouth every 6 (six) hours as needed for mild pain.   Yes  [provider]  aspirin EC 81 MG tablet Take 1 tablet (81 mg total) by mouth daily with breakfast. 09/22/20 09/22/21 Yes Emokpae, Courage, MD  cefdinir (OMNICEF) 300 MG capsule Take 600 mg by mouth daily.  02/14/21 Yes [provider]  Cholecalciferol (VITAMIN D-3) 125 MCG (5000 UT) TABS Take 1 tablet by mouth daily.   Yes [provider]  Lacosamide 150 MG TABS Take 1 tablet (150 mg total) by mouth 2 (two) times daily. Patient taking differently: Take 200 mg by mouth 2 (two) times daily. 09/22/20  Yes 02/16/21, MD  lamoTRIgine (LAMICTAL) 200 MG tablet Take 2 tablets (400 mg total) by mouth 2 (two) times daily. 09/22/20  Yes Emokpae, Courage, MD  lisinopril (PRINIVIL,ZESTRIL) 10 MG tablet Take 20 mg by mouth daily.   Yes [provider]  loperamide (IMODIUM A-D) 2 MG tablet  Take 2 mg by mouth 4 (four) times daily as needed for diarrhea or loose stools.   Yes [provider]  LORazepam (ATIVAN) 2 MG/ML concentrated solution Take 1 mg by mouth as needed for anxiety (Inject  IM as needed for seizure.  Repeat x 1 if needed.  Send to ER for any further seizures.).   Yes [provider]  metFORMIN (GLUCOPHAGE) 500 MG tablet Take 500 mg by mouth daily with breakfast.    Yes [provider]  Multiple Vitamin (MULTIVITAMIN WITH MINERALS) TABS tablet Take 1 tablet by mouth daily.   Yes [provider]  NOVOLOG FLEXPEN 100 UNIT/ML FlexPen Inject into the skin See admin instructions. Per sliding scale 05/20/20  Yes [provider]  LORazepam (ATIVAN) 1 MG tablet Take 1 mg by mouth 3 (three) times daily. Patient not taking: Reported on 02/13/2021 06/20/20   [provider]  Melatonin 3 MG TABS Take by mouth. Patient not taking: Reported on 02/13/2021    [provider]  mirtazapine (REMERON) 7.5 MG tablet Take 7.5 mg by mouth at bedtime. Patient not taking: Reported on 02/13/2021 07/02/20   [provider]   OLANZapine (ZYPREXA) 10 MG tablet Take 10 mg by mouth at bedtime. Patient not taking: Reported on 02/13/2021    [provider]  phenytoin (DILANTIN) 50 MG tablet Chew 150 mg by mouth at bedtime. Patient not taking: Reported on 02/13/2021    [provider]  Zinc Oxide 6 % CREA Apply 1 application topically in the morning and at bedtime. Apply to inner thighs Patient not taking: Reported on 02/13/2021    [provider]    Scheduled Meds:  aspirin  300 mg Rectal Daily   carbidopa-levodopa  1 tablet Oral BID   enoxaparin (LOVENOX) injection  40 mg Subcutaneous Q24H   ipratropium-albuterol  3 mL Nebulization Q6H   lamoTRIgine  400 mg Oral BID   Continuous Infusions:  ampicillin-sulbactam (UNASYN) IV Stopped (02/14/21 1027)   azithromycin Stopped (02/14/21 1041)   lacosamide (VIMPAT) IV Stopped (02/14/21 1059)   levETIRAcetam 500 mg (02/14/21 1619)   PRN Meds:.acetaminophen **OR** acetaminophen, food thickener, hydrALAZINE, ondansetron **OR** ondansetron (ZOFRAN) IV     Results for orders placed or performed during the hospital encounter of 02/13/21 (from the past 48 hour(s))  CBG monitoring, ED     Status: Abnormal   Collection Time: 02/13/21  7:26 AM  Result Value Ref Range   Glucose-Capillary 151 (H) 70 - 99 mg/dL    Comment: Glucose reference range applies only to samples taken after fasting for at least 8 hours.  Comprehensive metabolic panel     Status: Abnormal   Collection Time: 02/13/21  7:27 AM  Result Value Ref Range   Sodium 139 135 - 145 mmol/L   Potassium 3.6 3.5 - 5.1 mmol/L   Chloride 109 98 - 111 mmol/L   CO2 25 22 - 32 mmol/L   Glucose, Bld 178 (H) 70 - 99 mg/dL    Comment: Glucose reference range applies only to samples taken after fasting for at least 8 hours.   BUN 13 8 - 23 mg/dL   Creatinine, Ser 1.61 0.44 - 1.00 mg/dL   Calcium 8.7 (L) 8.9 - 10.3 mg/dL   Total Protein 6.5 6.5 - 8.1 g/dL   Albumin 2.8 (L) 3.5 - 5.0 g/dL   AST  16 15 - 41 U/L   ALT 12 0 - 44 U/L   Alkaline Phosphatase 74 38 - 126  U/L   Total Bilirubin 0.3 0.3 - 1.2 mg/dL   GFR, Estimated >16 >10 mL/min    Comment: (NOTE) Calculated using the CKD-EPI Creatinine Equation (2021)    Anion gap 5 5 - 15    Comment: Performed at [facility], [address] [place], [place] [address]  CBC WITH DIFFERENTIAL     Status: Abnormal   Collection Time: 02/13/21  7:27 AM  Result Value Ref Range   WBC 10.6 (H) 4.0 - 10.5 K/uL   RBC 3.53 (L) 3.87 - 5.11 MIL/uL   Hemoglobin 10.8 (L) 12.0 - 15.0 g/dL   HCT 40.9 (L) 81.1 - 91.4 %   MCV 94.9 80.0 - 100.0 fL   MCH 30.6 26.0 - 34.0 pg   MCHC 32.2 30.0 - 36.0 g/dL   RDW 78.2 95.6 - 21.3 %   Platelets 284 150 - 400 K/uL   nRBC 0.0 0.0 - 0.2 %   Neutrophils Relative % 78 %   Neutro Abs 8.4 (H) 1.7 - 7.7 K/uL   Lymphocytes Relative 9 %   Lymphs Abs 0.9 0.7 - 4.0 K/uL   Monocytes Relative 6 %   Monocytes Absolute 0.6 0.1 - 1.0 K/uL   Eosinophils Relative 2 %   Eosinophils Absolute 0.3 0.0 - 0.5 K/uL   Basophils Relative 1 %   Basophils Absolute 0.1 0.0 - 0.1 K/uL   Immature Granulocytes 4 %   Abs Immature Granulocytes 0.42 (H) 0.00 - 0.07 K/uL    Comment: Performed at [facility] [address] [place], [place] [address]  Brain natriuretic peptide     Status: Abnormal   Collection Time: 02/13/21  7:28 AM  Result Value Ref Range   B Natriuretic Peptide 256.0 (H) 0.0 - 100.0 pg/mL    Comment: Performed at [facility], [address] [place], [place] [address]  Resp Panel by RT-PCR (Flu A&B, Covid) Nasopharyngeal Swab     Status: None   Collection Time: 02/13/21 10:20 AM   Specimen: Nasopharyngeal Swab; Nasopharyngeal(NP) swabs in vial transport medium  Result Value Ref Range   SARS Coronavirus 2 by RT PCR NEGATIVE NEGATIVE    Comment: (NOTE) SARS-CoV-2 target nucleic acids are NOT DETECTED.  The SARS-CoV-2 RNA is generally detectable in upper respiratory specimens during the acute phase of infection.  The lowest concentration of SARS-CoV-2 viral copies this assay can detect is 138 copies/mL. A negative result does not preclude SARS-Cov-2 infection and should not be used as the sole basis for treatment or other patient management decisions. A negative result may occur with  improper specimen collection/handling, submission of specimen other than nasopharyngeal swab, presence of viral mutation(s) within the areas targeted by this assay, and inadequate number of viral copies(<138 copies/mL). A negative result must be combined with clinical observations, patient history, and epidemiological information. The expected result is Negative.  Fact Sheet for Patients:  BloggerCourse.com  Fact Sheet for Healthcare Providers:  SeriousBroker.it  This test is no t yet approved or cleared by the [place] FDA and  has been authorized for detection and/or diagnosis of SARS-CoV-2 by FDA under an Emergency Use Authorization (EUA). This EUA will remain  in effect (meaning this test can be used) for the duration of the COVID-19 declaration under Section 564(b)(1) of the Act, 21 U.S.C.section 360bbb-3(b)(1), unless the authorization is terminated  or revoked sooner.       Influenza A by PCR NEGATIVE NEGATIVE   Influenza B by PCR NEGATIVE NEGATIVE    Comment: (  NOTE) The Xpert Xpress SARS-CoV-2/FLU/RSV plus assay is intended as an aid in the diagnosis of influenza from Nasopharyngeal swab specimens and should not be used as a sole basis for treatment. Nasal washings and aspirates are unacceptable for Xpert Xpress SARS-CoV-2/FLU/RSV testing.  Fact Sheet for Patients: BloggerCourse.com  Fact Sheet for Healthcare Providers: SeriousBroker.it  This test is not yet approved or cleared by the [place] FDA and has been authorized for detection and/or diagnosis of SARS-CoV-2 by FDA under an  Emergency Use Authorization (EUA). This EUA will remain in effect (meaning this test can be used) for the duration of the COVID-19 declaration under Section 564(b)(1) of the Act, 21 U.S.C. section 360bbb-3(b)(1), unless the authorization is terminated or revoked.  Performed at [facility], [address] [place], [place] [address]   Blood gas, arterial     Status: Abnormal   Collection Time: 02/13/21  6:05 PM  Result Value Ref Range   FIO2 44.00    pH, Arterial 7.364 7.350 - 7.450   pCO2 arterial 43.6 32.0 - 48.0 mmHg   pO2, Arterial 62.0 (L) 83.0 - 108.0 mmHg   Bicarbonate 23.8 20.0 - 28.0 mmol/L   Acid-base deficit 0.4 0.0 - 2.0 mmol/L   O2 Saturation 91.5 %   Patient temperature 37.3    Allens test (pass/fail) PASS PASS    Comment: Performed at [facility] [place], [address], [place], [place] [address]  Phenytoin level, total     Status: Abnormal   Collection Time: 02/13/21  6:48 PM  Result Value Ref Range   Phenytoin Lvl <2.5 (L) 10.0 - 20.0 ug/mL    Comment: Performed at [facility], [address] [place], [place] [address]  Basic metabolic panel     Status: Abnormal   Collection Time: 02/14/21  4:40 AM  Result Value Ref Range   Sodium 143 135 - 145 mmol/L   Potassium 3.6 3.5 - 5.1 mmol/L   Chloride 108 98 - 111 mmol/L   CO2 27 22 - 32 mmol/L   Glucose, Bld 146 (H) 70 - 99 mg/dL    Comment: Glucose reference range applies only to samples taken after fasting for at least 8 hours.   BUN 9 8 - 23 mg/dL   Creatinine, Ser 9.14 0.44 - 1.00 mg/dL   Calcium 8.6 (L) 8.9 - 10.3 mg/dL   GFR, Estimated >78 >29 mL/min    Comment: (NOTE) Calculated using the CKD-EPI Creatinine Equation (2021)    Anion gap 8 5 - 15    Comment: Performed at [facility], [address], [place], [place] [address]  CBC     Status: Abnormal   Collection Time: 02/14/21  4:40 AM  Result Value Ref Range   WBC 13.2 (H) 4.0 - 10.5 K/uL   RBC 3.24 (L) 3.87 - 5.11 MIL/uL   Hemoglobin 10.0 (L) 12.0 - 15.0  g/dL   HCT 08.6 (L) 57.8 - 46.9 %   MCV 94.8 80.0 - 100.0 fL   MCH 30.9 26.0 - 34.0 pg   MCHC 32.6 30.0 - 36.0 g/dL   RDW 62.9 52.8 - 41.3 %   Platelets 278 150 - 400 K/uL   nRBC 0.0 0.0 - 0.2 %    Comment: Performed at [facility], [address], [place], [place] [address]  Magnesium     Status: Abnormal   Collection Time: 02/14/21  4:40 AM  Result Value Ref Range   Magnesium 1.3 (L) 1.7 - 2.4 mg/dL    Comment:  Performed at [facility], [address] [place], [place] [address]  Urinalysis, Complete w Microscopic Urine, Random     Status: Abnormal   Collection Time: 02/14/21 11:33 AM  Result Value Ref Range   Color, Urine STRAW (A) YELLOW   APPearance CLEAR CLEAR   Specific Gravity, Urine 1.008 1.005 - 1.030   pH 5.0 5.0 - 8.0   Glucose, UA NEGATIVE NEGATIVE mg/dL   Hgb urine dipstick SMALL (A) NEGATIVE   Bilirubin Urine NEGATIVE NEGATIVE   Ketones, ur NEGATIVE NEGATIVE mg/dL   Protein, ur NEGATIVE NEGATIVE mg/dL   Nitrite NEGATIVE NEGATIVE   Leukocytes,Ua NEGATIVE NEGATIVE   RBC / HPF 0-5 0 - 5 RBC/hpf   WBC, UA 0-5 0 - 5 WBC/hpf   Bacteria, UA RARE (A) NONE SEEN   Squamous Epithelial / LPF 0-5 0 - 5   Mucus PRESENT    Hyaline Casts, UA PRESENT     Comment: Performed at [facility], [address] [place], [place] [address]    Studies/Results:  EEG Description: No posterior dominant rhythm was seen. EEG showed continuous generalized 3 to 6 Hz theta-delta slowing. Abundant spikes were noted in left temporo-parietal region. Hyperventilation and photic stimulation were not performed.      ABNORMALITY -Spike, left temporo-parietal region - Continuous slow, generalized   IMPRESSION: This study showed evidence of epileptogenicity arising from left temporo-parietal region. There is also suggestive of moderate diffuse encephalopathy, nonspecific etiology. No seizures were seen throughout the recording.       Prolonged EEG 12/11-05/2018: EEG Interpretation: Abnormal  continuous video EEG due to: 1. Sharp Waves, left temporal, maximal F7/T3. 2. Background Slow     There were no seizures or push button events.   Clinical Correlation: These findings show evidence of increased  epileptogenic potential over the left temporal head region. In  addition to these findings, there is a diffuse underlying mild  encephalopathy.   Prolonged EEG 1/25-26/2019 (care-everywhere): Interpretation:  This is abnormal long term EEG, demonstrating  1. Left posterior region seizures 2. Lateralized, periodic discharges- left posterior region  3. Continuous and generalized slowing.  Clinical Correlation: The findings of the study indicate: 1. Seizures were seen in the left posterior region.  The last seizure was  seen at 0601 July 12, 2017. 2. Moderate global, encephalopathy of nonspecific etiology.  These  etiologies would include, yet not limit, to metabolic, infectious, toxic,  or post-ictal causes     BRAIN MRI 09-2020 CLINICAL DATA:  Acute neuro deficit.  Rule out stroke  IMPRESSION: Negative for acute infarct   Generalized atrophy with mild white matter changes consistent with chronic microvascular ischemia   Central disc protrusions at C3-4 and C4-5.    Avedis Bevis A. Brooke Pilgrim, M.D.  Diplomate, Biomedical engineer of Psychiatry and Neurology ( Neurology). 02/14/2021, 5:28 PM

## 2021-02-14 NOTE — Progress Notes (Signed)
   02/14/21 2045  Vitals  Temp (!) 97.4 F (36.3 C)  BP (!) 176/71  MAP (mmHg) 99  BP Location Left Arm  BP Method Automatic  Patient Position (if appropriate) Lying  Pulse Rate (!) 57  Pulse Rate Source Monitor  Resp (!) 23  MEWS COLOR  MEWS Score Color Yellow  Oxygen Therapy  SpO2 99 %  O2 Device Nasal Cannula  MEWS Score  MEWS Temp 0  MEWS Systolic 0  MEWS Pulse 0  MEWS RR 1  MEWS LOC 1  MEWS Score 2    Will continue to monitor x2 hours. Charge nurse notified.

## 2021-02-14 NOTE — Progress Notes (Signed)
Upon arrival from ICU patients O2 sat 87% on 2L [place]. Respiratory called advised by RT to increase O2 to 4L bringing her O2 sat to 93% maintained. Pt enroute vomited x 2 per RN. Patient denies any nausea or pain at this time. Secure chat sent to MD awaiting response. Patient made comfortable will continue to closely monitor. Charge informed of yellow mews.

## 2021-02-14 NOTE — Consult Note (Signed)
Consultation Note Date: 02/14/2021   Patient Name: Brooke Gross  DOB: May 24, 1941  MRN: [id number]  Age / Sex: 80 y.o., female  PCP: Galvin Proffer, MD Referring Physician: Cleora Fleet, MD  Reason for Consultation: Establishing goals of care  HPI/Patient Profile: 80 y.o. 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   Clinical Assessment and Goals of Care: I have reviewed medical records including EPIC notes, labs and imaging, received report from RN, assessed the patient.  Brooke Gross is resting quietly in bed.  She is alert, able to tell me her name, and that we are in the hospital.  She is unable to tell me her location or the date or time.  She frequently repeats words or numbers.  I am not sure, but I believe that she can make her basic needs known.  There is no family at bedside at this time.   Call to [facility], first contact listed at [phone], no answer, unable to leave voicemail message. Isaias Cowman states this is her father who died about 20 years ago.   Call to son, Lynford Humphrey [phone] to discuss diagnosis prognosis, GOC, EOL wishes, disposition and options.  I introduced Palliative Medicine as specialized medical care for people living with serious illness. It focuses on providing relief from the symptoms and stress of a serious illness. The goal is to improve quality of life for both the patient and the family.  We discussed a brief life review of the patient.  Brooke Gross is divorced.  She has lived in [facility] for 3 to 4 years before moving to [place] to their memory  center about 1 year ago.  [name] shares that she was the main caregiver for her parents until they died.  She lived alone for about 1 year, but had an uncontrollable seizure which led to a 1 month hospitalization where she was "in a coma".  [name] states that they could find no cause for the seizure, and she has continued to have seizures, followed by neurology, they have been unable to pinpoint a causative problem.  [name] shares that [name] was in the hospital in [place] over weekend, for UTI.  Isaias Cowman shares the changes he has seen in his mother over the last few years.  He tells me that the last time he saw her, he was not able to understand what she was trying to tell him, and she kept repeating herself.  He tells me that he was not sure if she knew who he was.  He talks about her quality of life, and shares that although she does not seem to be in any distress, she has poor quality.  [name] asks about prognosis.  I share that 6 months or less would not be surprising based  on her chronic illness burden, declining functional status, recent hospital stays with aspiration.  We talked about aspiration risk, speech therapy consult, thickened liquids and the risk for dehydration. The natural disease trajectory and expectations at EOL were discussed.  Advanced directives, concepts specific to code status, were considered and discussed.  She is DNR.  [name] shares that he has experience with his father dying recently, and he regrets the last transfusion his father received as he felt like this unnecessarily prolong his life and also felt that he should have liberalized pain and anxiety medicines.  [name] shares that he initially had a hard time saying no to central line for his mother, but now considers whether he would agree to antibiotics again in the future.  I shared that modern medicine can prolong life, but we can also prolong the dying process and thereby suffering.  Brooke Gross would [facility] to [facility] upon discharge.  Hospice services outpatient were explained and offered.  [name] shares that he is considering hospice at [facility], [place].  Provider choice discussed.  He tells me that whoever services that facility would be his provider choice.  We talked about [employer].  Discussed the importance of continued conversation with family and the medical providers regarding overall plan of care and treatment options, ensuring decisions are within the context of the patient's values and GOCs.  Questions and concerns were addressed. The family was encouraged to call with questions or concerns.  PMT will continue to support holistically.  Conference with attending, bedside nursing staff, transition of care team related to patient condition, needs, goals of care, disposition.  HCPOA   NEXT OF KIN -son, Lynford Humphrey, tells me that he is legal healthcare power of attorney.    SUMMARY OF RECOMMENDATIONS   At this point continue to treat the treatable but no CPR or intubation Return to [facility] [place] in the memory care unit when stable Interested in "treat the treatable" hospice care.  Code Status/Advance Care Planning: DNR  Symptom Management:  Per hospitalist, no additional needs at this time.  Palliative Prophylaxis:  Frequent Pain Assessment and Oral Care  Additional Recommendations (Limitations, Scope, Preferences): Continue to treat the treatable but no CPR or intubation. MOST form discussed, and [name] encouraged to review.  Psycho-social/Spiritual:  Desire for further Chaplaincy support:no Additional Recommendations: Caregiving  Support/Resources and Education on Hospice  Prognosis:  Unable to determine, less than 6 months would not be surprising based on chronic illness burden, poor functional status, resident of long-term care.  Discharge Planning:  To be determined, anticipate return to long-term care.  Would benefit from outpatient  palliative services.       Primary Diagnoses: Present on Admission:  Acute respiratory failure with hypoxia (HCC)   I have reviewed the medical record, interviewed the patient and family, and examined the patient. The following aspects are pertinent.  Past Medical History:  Diagnosis Date   Abnormality of gait 12/15/2013   Anemia    Anxiety    Anxiety disorder    Bipolar 1 disorder (HCC)    CKD (chronic kidney disease)    Depression    Diabetes (HCC)    Dysphagia    Epilepsy (HCC)    Gait difficulty    GERD (gastroesophageal reflux disease)    Hypercholesteremia    Hyperlipemia    Hyperlipidemia    Hypertension    Immunodeficiency (HCC)    Major depressive disorder    Malaise    Migraine  NSTEMI, initial episode of care [facility]) 03/18/2014   Obese    Sciatica    Seizures (HCC)    Tremor    Vertigo, labyrinthine    Social History   Socioeconomic History   Marital status: Divorced    Spouse name: Not on file   Number of children: 1   Years of education: college-2   Highest education level: Not on file  Occupational History   Occupation: Retired   Occupation: Aeronautical engineer   Occupation: retired  Tobacco Use   Smoking status: Former    Packs/day: 0.50    Years: 15.00    Pack years: 7.50    Types: Cigarettes    Quit date: 2007    Years since quitting: 15.6   Smokeless tobacco: Current    Types: Chew   Tobacco comments:    quit 1977  Vaping Use   Vaping Use: Never used  Substance and Sexual Activity   Alcohol use: Not Currently    Comment: occasional   Drug use: No   Sexual activity: Not on file  Other Topics Concern   Not on file  Social History Narrative   ** Merged History Encounter **       ** Merged History Encounter **       L[facility] at [facility] in [place]. Left-handed. 1 cup coffee per day.   Social Determinants of Health   Financial Resource Strain: Not on file  Food Insecurity: Not on file  Transportation Needs: Not on file   Physical Activity: Not on file  Stress: Not on file  Social Connections: Not on file   Family History  Problem Relation Age of Onset   Other Mother        unsure of history   Heart attack Father    Dementia Mother    Congestive Heart Failure Mother    Dementia Father    Scheduled Meds:  aspirin  300 mg Rectal Daily   carbidopa-levodopa  1 tablet Oral BID   enoxaparin (LOVENOX) injection  40 mg Subcutaneous Q24H   ipratropium-albuterol  3 mL Nebulization Q6H   lamoTRIgine  400 mg Oral BID   Continuous Infusions:  ampicillin-sulbactam (UNASYN) IV 3 g (02/14/21 0943)   azithromycin 500 mg (02/14/21 0934)   lacosamide (VIMPAT) IV 200 mg (02/14/21 1029)   levETIRAcetam     PRN Meds:.acetaminophen **OR** acetaminophen, food thickener, hydrALAZINE, ondansetron **OR** ondansetron (ZOFRAN) IV Medications Prior to Admission:  Prior to Admission medications   Medication Sig Start Date End Date Taking? Authorizing Provider  acetaminophen (TYLENOL) 325 MG tablet Take 650 mg by mouth every 6 (six) hours as needed for mild pain.   Yes [provider]  aspirin EC 81 MG tablet Take 1 tablet (81 mg total) by mouth daily with breakfast. 09/22/20 09/22/21 Yes Emokpae, Courage, MD  cefdinir (OMNICEF) 300 MG capsule Take 600 mg by mouth daily.  02/14/21 Yes [provider]  Cholecalciferol (VITAMIN D-3) 125 MCG (5000 UT) TABS Take 1 tablet by mouth daily.   Yes [provider]  Lacosamide 150 MG TABS Take 1 tablet (150 mg total) by mouth 2 (two) times daily. Patient taking differently: Take 200 mg by mouth 2 (two) times daily. 09/22/20  Yes Shon Hale, MD  lamoTRIgine (LAMICTAL) 200 MG tablet Take 2 tablets (400 mg total) by mouth 2 (two) times daily. 09/22/20  Yes Emokpae, Courage, MD  lisinopril (PRINIVIL,ZESTRIL) 10 MG tablet Take 20 mg by mouth daily.   Yes [provider]  loperamide (IMODIUM A-D) 2 MG tablet Take 2 mg by mouth 4 (four) times daily as needed  for diarrhea or loose stools.   Yes [provider]  LORazepam (ATIVAN) 2 MG/ML concentrated solution Take 1 mg by mouth as needed for anxiety (Inject 1mg  IM as needed for seizure.  Repeat x 1 if needed.  Send to ER for any further seizures.).   Yes [provider]  metFORMIN (GLUCOPHAGE) 500 MG tablet Take 500 mg by mouth daily with breakfast.    Yes [provider]  Multiple Vitamin (MULTIVITAMIN WITH MINERALS) TABS tablet Take 1 tablet by mouth daily.   Yes [provider]  NOVOLOG FLEXPEN 100 UNIT/ML FlexPen Inject into the skin See admin instructions. Per sliding scale 05/20/20  Yes [provider]  LORazepam (ATIVAN) 1 MG tablet Take 1 mg by mouth 3 (three) times daily. Patient not taking: Reported on 02/13/2021 06/20/20   [provider]  Melatonin 3 MG TABS Take by mouth. Patient not taking: Reported on 02/13/2021    [provider]  mirtazapine (REMERON) 7.5 MG tablet Take 7.5 mg by mouth at bedtime. Patient not taking: Reported on 02/13/2021 07/02/20   [provider]  OLANZapine (ZYPREXA) 10 MG tablet Take 10 mg by mouth at bedtime. Patient not taking: Reported on 02/13/2021    [provider]  phenytoin (DILANTIN) 50 MG tablet Chew 150 mg by mouth at bedtime. Patient not taking: Reported on 02/13/2021    [provider]  Zinc Oxide 6 % CREA Apply 1 application topically in the morning and at bedtime. Apply to inner thighs Patient not taking: Reported on 02/13/2021    [provider]   Allergies  Allergen Reactions   Tramadol     Other reaction(s): Seizures   Aspirin Hives   Aspirin Other (See Comments)    Pt reports hives with any dose above 81mg .   Review of Systems  Unable to perform ROS: Dementia   Physical Exam Vitals and nursing note reviewed.  Constitutional:      General: She is not in acute distress.    Appearance: Normal appearance. She is ill-appearing.  Cardiovascular:      Rate and Rhythm: Normal rate.  Pulmonary:     Effort: Pulmonary effort is normal. No respiratory distress.  Skin:    General: Skin is warm and dry.  Neurological:     Mental Status: She is alert.     Comments: Known memory loss     Vital Signs: BP (!) 100/59 (BP Location: Right Arm)   Pulse (!) 55   Temp 98.2 F (36.8 C) (Oral)   Resp 19   Ht 5\' 6"  (1.676 m)   Wt 81.6 kg   SpO2 97%   BMI 29.05 kg/m  Pain Scale: 0-10   Pain Score: 0-No pain   SpO2: SpO2: 97 % O2 Device:SpO2: 97 % O2 Flow Rate: .O2 Flow Rate (L/min): 2 L/min  IO: Intake/output summary:  Intake/Output Summary (Last 24 hours) at 02/14/2021 1456 Last data filed at 02/13/2021 2343 Gross per 24 hour  Intake 343.96 ml  Output 1700 ml  Net -1356.04 ml    LBM: Last BM Date: 02/13/21 Baseline Weight: Weight: 81.6 kg Most recent weight: Weight: 81.6 kg     Palliative Assessment/Data:   Flowsheet Rows    Flowsheet Row Most Recent Value  Intake Tab   Referral Department Hospitalist  Unit at Time of Referral Intermediate Care Unit  Palliative Care Primary  Diagnosis Pulmonary  Date Notified 02/14/21  Palliative Care Type New Palliative care  Reason for referral Clarify Goals of Care  Date of Admission 02/13/21  Date first seen by Palliative Care 02/14/21  # of days Palliative referral response time 0 Day(s)  # of days IP prior to Palliative referral 1  Clinical Assessment   Palliative Performance Scale Score 30%  Pain Max last 24 hours Not able to report  Pain Min Last 24 hours Not able to report  Dyspnea Max Last 24 Hours Not able to report  Dyspnea Min Last 24 hours Not able to report  Psychosocial & Spiritual Assessment   Palliative Care Outcomes        Time In: 1400 Time Out: 1510 Time Total: 70 minutes  Greater than 50%  of this time was spent counseling and coordinating care related to the above assessment and plan.  Signed by: Katheran Aweasha A Tanor Glaspy, NP   Please contact Palliative Medicine  Team phone at [phone] for questions and concerns.  For individual provider: See Loretha StaplerAmion

## 2021-02-15 LAB — CBC WITH DIFFERENTIAL/PLATELET
Abs Immature Granulocytes: 0.29 10*3/uL — ABNORMAL HIGH (ref 0.00–0.07)
Basophils Absolute: 0 10*3/uL (ref 0.0–0.1)
Basophils Relative: 0 %
Eosinophils Absolute: 0.3 10*3/uL (ref 0.0–0.5)
Eosinophils Relative: 3 %
HCT: 29.9 % — ABNORMAL LOW (ref 36.0–46.0)
Hemoglobin: 9.5 g/dL — ABNORMAL LOW (ref 12.0–15.0)
Immature Granulocytes: 3 %
Lymphocytes Relative: 11 %
Lymphs Abs: 1.1 10*3/uL (ref 0.7–4.0)
MCH: 30.4 pg (ref 26.0–34.0)
MCHC: 31.8 g/dL (ref 30.0–36.0)
MCV: 95.8 fL (ref 80.0–100.0)
Monocytes Absolute: 0.6 10*3/uL (ref 0.1–1.0)
Monocytes Relative: 5 %
Neutro Abs: 8.5 10*3/uL — ABNORMAL HIGH (ref 1.7–7.7)
Neutrophils Relative %: 78 %
Platelets: 271 10*3/uL (ref 150–400)
RBC: 3.12 MIL/uL — ABNORMAL LOW (ref 3.87–5.11)
RDW: 13.5 % (ref 11.5–15.5)
WBC: 10.9 10*3/uL — ABNORMAL HIGH (ref 4.0–10.5)
nRBC: 0 % (ref 0.0–0.2)

## 2021-02-15 LAB — COMPREHENSIVE METABOLIC PANEL
ALT: 5 U/L (ref 0–44)
AST: 16 U/L (ref 15–41)
Albumin: 2.5 g/dL — ABNORMAL LOW (ref 3.5–5.0)
Alkaline Phosphatase: 65 U/L (ref 38–126)
Anion gap: 7 (ref 5–15)
BUN: 8 mg/dL (ref 8–23)
CO2: 28 mmol/L (ref 22–32)
Calcium: 8.5 mg/dL — ABNORMAL LOW (ref 8.9–10.3)
Chloride: 108 mmol/L (ref 98–111)
Creatinine, Ser: 0.71 mg/dL (ref 0.44–1.00)
GFR, Estimated: >60 mL/min (ref >60)
Glucose, Bld: 153 mg/dL — ABNORMAL HIGH (ref 70–99)
Potassium: 3.4 mmol/L — ABNORMAL LOW (ref 3.5–5.1)
Sodium: 143 mmol/L (ref 135–145)
Total Bilirubin: 0.2 mg/dL — ABNORMAL LOW (ref 0.3–1.2)
Total Protein: 5.8 g/dL — ABNORMAL LOW (ref 6.5–8.1)

## 2021-02-15 LAB — MAGNESIUM: Magnesium: 2 mg/dL (ref 1.7–2.4)

## 2021-02-15 MED ORDER — LISINOPRIL 5 MG PO TABS
15.0000 mg | ORAL_TABLET | Freq: Every day | ORAL | Status: DC
  Administered 2021-02-15: 15 mg via ORAL
  Filled 2021-02-15 (×2): qty 1

## 2021-02-15 MED ORDER — AMLODIPINE BESYLATE 5 MG PO TABS
5.0000 mg | ORAL_TABLET | Freq: Every day | ORAL | Status: DC
  Administered 2021-02-15: 5 mg via ORAL
  Filled 2021-02-15 (×2): qty 1

## 2021-02-15 MED ORDER — ASPIRIN 81 MG PO CHEW
81.0000 mg | CHEWABLE_TABLET | Freq: Every day | ORAL | Status: DC
  Administered 2021-02-15: 81 mg via ORAL
  Filled 2021-02-15 (×2): qty 1

## 2021-02-15 MED ORDER — LORAZEPAM 2 MG/ML IJ SOLN
0.5000 mg | Freq: Once | INTRAMUSCULAR | Status: AC
  Administered 2021-02-15: 0.5 mg via INTRAVENOUS
  Filled 2021-02-15: qty 1

## 2021-02-15 MED ORDER — HALOPERIDOL LACTATE 5 MG/ML IJ SOLN: 3.5000 mg | Freq: Once | INTRAMUSCULAR | Status: DC

## 2021-02-15 MED ORDER — LACOSAMIDE 200 MG/20ML IV SOLN
INTRAVENOUS | Status: AC
  Filled 2021-02-15: qty 20

## 2021-02-15 MED ORDER — IPRATROPIUM-ALBUTEROL 0.5-2.5 (3) MG/3ML IN SOLN
3.0000 mL | Freq: Two times a day (BID) | RESPIRATORY_TRACT | Status: DC
  Administered 2021-02-15 – 2021-02-16 (×2): 3 mL via RESPIRATORY_TRACT
  Filled 2021-02-15 (×2): qty 3

## 2021-02-15 MED ORDER — POTASSIUM CHLORIDE 10 MEQ/100ML IV SOLN
10.0000 meq | INTRAVENOUS | Status: AC
Start: 2021-02-15 — End: 2021-02-15
  Administered 2021-02-15: 10 meq via INTRAVENOUS
  Filled 2021-02-15: qty 100

## 2021-02-15 MED ORDER — CARBIDOPA-LEVODOPA 25-100 MG PO TABS
1.0000 | ORAL_TABLET | Freq: Three times a day (TID) | ORAL | Status: DC
  Filled 2021-02-15: qty 1

## 2021-02-15 MED ORDER — CARBIDOPA-LEVODOPA 25-100 MG PO TABS: 1.0000 | ORAL_TABLET | Freq: Four times a day (QID) | ORAL | Status: DC

## 2021-02-15 MED ORDER — POTASSIUM CHLORIDE 10 MEQ/100ML IV SOLN
INTRAVENOUS | Status: AC
  Administered 2021-02-15: 10 meq
  Filled 2021-02-15: qty 100

## 2021-02-15 MED ORDER — IPRATROPIUM-ALBUTEROL 0.5-2.5 (3) MG/3ML IN SOLN: 3.0000 mL | Freq: Four times a day (QID) | RESPIRATORY_TRACT | Status: DC | PRN

## 2021-02-15 NOTE — Progress Notes (Signed)
[facility] Brooke Reber A. Gerilyn Pilgrim, MD     [email or web address]          Brooke Gross is an 80 y.o. female.   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 She seemed to have responded modestly there for the dose of Sinemet will be increased. INTRACTABLE EPILEPSY: Keppra initiated. We will continue with Lamictal and the Vimpat. 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 I suggested another spot EEG be done after with made changes to her medications. 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      She is much more alert but still confused. She does follow commands briskly.    GENERAL:  She has moderate psychomotor slowing.  HEENT:  No trauma noted. There is severe axial rigidity.   ABDOMEN: soft  EXTREMITIES: No edema   BACK: Normal  SKIN: Normal by inspection.    MENTAL STATUS:   She is awake and alert and followed commands briskly. She does say a couple words but mostly nonsensical.  CRANIAL NERVES: Pupils are equal, round and reactive to light and accomodation; extra ocular movements are full, there is no significant nystagmus; visual fields are full; upper and lower facial muscles are normal in strength and symmetric, there is no flattening of the nasolabial folds; tongue is midline; uvula is midline; shoulder elevation is normal.  MOTOR:  She has antigravity strength. There is severe appendicular rigidity.  COORDINATION:  There is intermittent rest tremor bilaterally much more on the right side. There is severe bradykinesia, cogwheeling and rigidity. Rigidity is somewhat more on the left side.       BAPTIST NOTE 06-2018 REASON FOR CONSULT/CHIEF COMPLAINT: Epilepsy with history of status epilepticus, cognitive impairment, stroke  HPI: Pt is a 80 y.o. female with pertinent PMH  significant for hypertension, hyperlipidemia, diabetes, epilepsy,? Stroke (06/2017) who presents to clinic from SNF transported on a stretcher with a representative from the nursing home. History obtained by a combination of chart review, discussion with the son on the phone, discussion with her primary care provider at [facility] Dr. Doyne Keel.  In brief, per my discussions the following approximate time course of events occurred regarding her history of seizures: - Onset: 41 (son endorses that he has spoken on the phone with her and then later on could not get a hold her neither "other family members so they called EMS who went to do a check and found her unresponsive. She was then found to be in status epilepticus and reportedly placed in a coma. Son denies any known history of seizures prior to that time) -Risk factors: No febrile seizure, no known head trauma, no CNS infection, no family history of seizures -Semiology: 1. Per outside notes she has previously had right sided weakness and aphasia (2015, 06/2017) associated with seizures.  2. Will have staring and mumbling and intermittent jerking per SNF notes.  - Prior AED: LEV  - current AED: LCM, PHT, LTG   Dr. Doyne Keel notes that she was being followed by [facility] neurology but they were trying to decrease her AEDs and after each time she went into status epilepticus. She most recently was hospitalized in December 2019 again after her medication doses were decreased. Her most recent seizure based on the SNF documentation was 06/11/2018 and Dr. Doyne Keel notes that her dose of lamotrigine was recently increased.        Blood pressure Marland Kitchen)  152/72, pulse (!) 49, temperature 97.9 F (36.6 C), temperature source Oral, resp. rate 20, height 5\' 6"  (1.676 m), weight 81.6 kg, SpO2 93 %.  Past Medical History:  Diagnosis Date   Abnormality of gait 12/15/2013   Anemia    Anxiety    Anxiety disorder    Bipolar 1 disorder (HCC)    CKD (chronic kidney  disease)    Depression    Diabetes (HCC)    Dysphagia    Epilepsy (HCC)    Gait difficulty    GERD (gastroesophageal reflux disease)    Hypercholesteremia    Hyperlipemia    Hyperlipidemia    Hypertension    Immunodeficiency (HCC)    Major depressive disorder    Malaise    Migraine    NSTEMI, initial episode of care (HCC) 03/18/2014   Obese    Sciatica    Seizures (HCC)    Tremor    Vertigo, labyrinthine     Past Surgical History:  Procedure Laterality Date   CATARACT EXTRACTION Bilateral    CERVICAL SPINE SURGERY     cholecsystectomy  1977   CHOLECYSTECTOMY     COLONOSCOPY  2010   degenerative disc diease     NECK SURGERY  1990s   TONSILLECTOMY      Family History  Problem Relation Age of Onset   Other Mother        unsure of history   Heart attack Father    Dementia Mother    Congestive Heart Failure Mother    Dementia Father     Social History:  reports that she quit smoking about 15 years ago. Her smoking use included cigarettes. She has a 7.50 pack-year smoking history. Her smokeless tobacco use includes chew. She reports that she does not currently use alcohol. She reports that she does not use drugs.  Allergies:  Allergies  Allergen Reactions   Tramadol     Other reaction(s): Seizures   Aspirin Hives   Aspirin Other (See Comments)    Pt reports hives with any dose above 81mg .    Medications: Prior to Admission medications   Medication Sig Start Date End Date Taking? Authorizing Provider  acetaminophen (TYLENOL) 325 MG tablet Take 650 mg by mouth every 6 (six) hours as needed for mild pain.   Yes [provider]  aspirin EC 81 MG tablet Take 1 tablet (81 mg total) by mouth daily with breakfast. 09/22/20 09/22/21 Yes Emokpae, Courage, MD  cefdinir (OMNICEF) 300 MG capsule Take 600 mg by mouth daily.  02/14/21 Yes [provider]  Cholecalciferol (VITAMIN D-3) 125 MCG (5000 UT) TABS Take 1 tablet by mouth daily.   Yes [provider]  Lacosamide 150 MG TABS Take 1 tablet (150 mg total) by mouth 2 (two) times daily. Patient taking differently: Take 200 mg by mouth 2 (two) times daily. 09/22/20  Yes 02/16/21, MD  lamoTRIgine (LAMICTAL) 200 MG tablet Take 2 tablets (400 mg total) by mouth 2 (two) times daily. 09/22/20  Yes Emokpae, Courage, MD  lisinopril (PRINIVIL,ZESTRIL) 10 MG tablet Take 20 mg by mouth daily.   Yes [provider]  loperamide (IMODIUM A-D) 2 MG tablet Take 2 mg by mouth 4 (four) times daily as needed for diarrhea or loose stools.   Yes [provider]  LORazepam (ATIVAN) 2 MG/ML concentrated solution Take 1 mg by mouth as needed for anxiety (Inject 1mg  IM as needed for seizure.  Repeat x 1 if needed.  Send  to ER for any further seizures.).   Yes [provider]  metFORMIN (GLUCOPHAGE) 500 MG tablet Take 500 mg by mouth daily with breakfast.    Yes [provider]  Multiple Vitamin (MULTIVITAMIN WITH MINERALS) TABS tablet Take 1 tablet by mouth daily.   Yes [provider]  NOVOLOG FLEXPEN 100 UNIT/ML FlexPen Inject into the skin See admin instructions. Per sliding scale 05/20/20  Yes [provider]  LORazepam (ATIVAN) 1 MG tablet Take 1 mg by mouth 3 (three) times daily. Patient not taking: Reported on 02/13/2021 06/20/20   [provider]  Melatonin 3 MG TABS Take by mouth. Patient not taking: Reported on 02/13/2021    [provider]  mirtazapine (REMERON) 7.5 MG tablet Take 7.5 mg by mouth at bedtime. Patient not taking: Reported on 02/13/2021 07/02/20   [provider]  OLANZapine (ZYPREXA) 10 MG tablet Take 10 mg by mouth at bedtime. Patient not taking: Reported on 02/13/2021    [provider]  phenytoin (DILANTIN) 50 MG tablet Chew 150 mg by mouth at bedtime. Patient not taking: Reported on 02/13/2021    [provider]  Zinc Oxide 6 % CREA Apply 1 application topically in the morning and at  bedtime. Apply to inner thighs Patient not taking: Reported on 02/13/2021    [provider]    Scheduled Meds:  amLODipine  5 mg Oral Daily   aspirin  81 mg Oral Daily   [START ON 02/16/2021] carbidopa-levodopa  1 tablet Oral TID   Followed by   Melene Muller ON 02/19/2021] carbidopa-levodopa  1 tablet Oral QID   enoxaparin (LOVENOX) injection  40 mg Subcutaneous Q24H   ipratropium-albuterol  3 mL Nebulization BID   lamoTRIgine  400 mg Oral BID   lisinopril  15 mg Oral Daily   Continuous Infusions:  ampicillin-sulbactam (UNASYN) IV 3 g (02/15/21 2119)   azithromycin 500 mg (02/15/21 0921)   lacosamide (VIMPAT) IV 200 mg (02/15/21 1448)   levETIRAcetam 500 mg (02/15/21 1224)   PRN Meds:.acetaminophen **OR** acetaminophen, food thickener, hydrALAZINE, ipratropium-albuterol, ondansetron **OR** ondansetron (ZOFRAN) IV     Results for orders placed or performed during the hospital encounter of 02/13/21 (from the past 48 hour(s))  Basic metabolic panel     Status: Abnormal   Collection Time: 02/14/21  4:40 AM  Result Value Ref Range   Sodium 143 135 - 145 mmol/L   Potassium 3.6 3.5 - 5.1 mmol/L   Chloride 108 98 - 111 mmol/L   CO2 27 22 - 32 mmol/L   Glucose, Bld 146 (H) 70 - 99 mg/dL    Comment: Glucose reference range applies only to samples taken after fasting for at least 8 hours.   BUN 9 8 - 23 mg/dL   Creatinine, Ser 1.61 0.44 - 1.00 mg/dL   Calcium 8.6 (L) 8.9 - 10.3 mg/dL   GFR, Estimated >09 >60 mL/min    Comment: (NOTE) Calculated using the CKD-EPI Creatinine Equation (2021)    Anion gap 8 5 - 15    Comment: Performed at [facility], [address] [place], [place] [address]  CBC     Status: Abnormal   Collection Time: 02/14/21  4:40 AM  Result Value Ref Range   WBC 13.2 (H) 4.0 - 10.5 K/uL   RBC 3.24 (L) 3.87 - 5.11 MIL/uL   Hemoglobin 10.0 (L) 12.0 - 15.0 g/dL   HCT 81.1 (L) 91.4 - 78.2 %   MCV 94.8 80.0 - 100.0 fL  MCH 30.9 26.0 - 34.0 pg   MCHC 32.6 30.0  - 36.0 g/dL   RDW 96.013.7 45.411.5 - 09.815.5 %   Platelets 278 150 - 400 K/uL   nRBC 0.0 0.0 - 0.2 %    Comment: Performed at [facility], [address] [place], [place] [address]  Magnesium     Status: Abnormal   Collection Time: 02/14/21  4:40 AM  Result Value Ref Range   Magnesium 1.3 (L) 1.7 - 2.4 mg/dL    Comment: Performed at [facility], [address] [place], [place] [address]  Urinalysis, Complete w Microscopic Urine, Random     Status: Abnormal   Collection Time: 02/14/21 11:33 AM  Result Value Ref Range   Color, Urine STRAW (A) YELLOW   APPearance CLEAR CLEAR   Specific Gravity, Urine 1.008 1.005 - 1.030   pH 5.0 5.0 - 8.0   Glucose, UA NEGATIVE NEGATIVE mg/dL   Hgb urine dipstick SMALL (A) NEGATIVE   Bilirubin Urine NEGATIVE NEGATIVE   Ketones, ur NEGATIVE NEGATIVE mg/dL   Protein, ur NEGATIVE NEGATIVE mg/dL   Nitrite NEGATIVE NEGATIVE   Leukocytes,Ua NEGATIVE NEGATIVE   RBC / HPF 0-5 0 - 5 RBC/hpf   WBC, UA 0-5 0 - 5 WBC/hpf   Bacteria, UA RARE (A) NONE SEEN   Squamous Epithelial / LPF 0-5 0 - 5   Mucus PRESENT    Hyaline Casts, UA PRESENT     Comment: Performed at [facility], [address] [place], [place] [address]  CBC with Differential/Platelet     Status: Abnormal   Collection Time: 02/15/21  5:26 AM  Result Value Ref Range   WBC 10.9 (H) 4.0 - 10.5 K/uL   RBC 3.12 (L) 3.87 - 5.11 MIL/uL   Hemoglobin 9.5 (L) 12.0 - 15.0 g/dL   HCT 86.529.9 (L) 78.436.0 - 69.646.0 %   MCV 95.8 80.0 - 100.0 fL   MCH 30.4 26.0 - 34.0 pg   MCHC 31.8 30.0 - 36.0 g/dL   RDW 29.513.5 28.411.5 - 13.215.5 %   Platelets 271 150 - 400 K/uL   nRBC 0.0 0.0 - 0.2 %   Neutrophils Relative % 78 %   Neutro Abs 8.5 (H) 1.7 - 7.7 K/uL   Lymphocytes Relative 11 %   Lymphs Abs 1.1 0.7 - 4.0 K/uL   Monocytes Relative 5 %   Monocytes Absolute 0.6 0.1 - 1.0 K/uL   Eosinophils Relative 3 %   Eosinophils Absolute 0.3 0.0 - 0.5 K/uL   Basophils Relative 0 %   Basophils Absolute 0.0 0.0 - 0.1 K/uL   Immature  Granulocytes 3 %   Abs Immature Granulocytes 0.29 (H) 0.00 - 0.07 K/uL    Comment: Performed at [facility], [address] [place], [place] [address]  Comprehensive metabolic panel     Status: Abnormal   Collection Time: 02/15/21  5:26 AM  Result Value Ref Range   Sodium 143 135 - 145 mmol/L   Potassium 3.4 (L) 3.5 - 5.1 mmol/L   Chloride 108 98 - 111 mmol/L   CO2 28 22 - 32 mmol/L   Glucose, Bld 153 (H) 70 - 99 mg/dL    Comment: Glucose reference range applies only to samples taken after fasting for at least 8 hours.   BUN 8 8 - 23 mg/dL   Creatinine, Ser 2.720.71 0.44 - 1.00 mg/dL   Calcium 8.5 (L) 8.9 - 10.3 mg/dL   Total Protein 5.8 (L) 6.5 - 8.1 g/dL  Albumin 2.5 (L) 3.5 - 5.0 g/dL   AST 16 15 - 41 U/L   ALT 5 0 - 44 U/L   Alkaline Phosphatase 65 38 - 126 U/L   Total Bilirubin 0.2 (L) 0.3 - 1.2 mg/dL   GFR, Estimated >11 >94 mL/min    Comment: (NOTE) Calculated using the CKD-EPI Creatinine Equation (2021)    Anion gap 7 5 - 15    Comment: Performed at [facility], [address] [place], [place] [address]  Magnesium     Status: None   Collection Time: 02/15/21  5:26 AM  Result Value Ref Range   Magnesium 2.0 1.7 - 2.4 mg/dL    Comment: Performed at [facility], [address], [place], [place] [address]    Studies/Results:  EEG Description: No posterior dominant rhythm was seen. EEG showed continuous generalized 3 to 6 Hz theta-delta slowing. Abundant spikes were noted in left temporo-parietal region. Hyperventilation and photic stimulation were not performed.      ABNORMALITY -Spike, left temporo-parietal region - Continuous slow, generalized   IMPRESSION: This study showed evidence of epileptogenicity arising from left temporo-parietal region. There is also suggestive of moderate diffuse encephalopathy, nonspecific etiology. No seizures were seen throughout the recording.       Prolonged EEG 12/11-05/2018: EEG Interpretation: Abnormal continuous video EEG  due to: 1. Sharp Waves, left temporal, maximal F7/T3. 2. Background Slow     There were no seizures or push button events.   Clinical Correlation: These findings show evidence of increased  epileptogenic potential over the left temporal head region. In  addition to these findings, there is a diffuse underlying mild  encephalopathy.   Prolonged EEG 1/25-26/2019 (care-everywhere): Interpretation:  This is abnormal long term EEG, demonstrating  1. Left posterior region seizures 2. Lateralized, periodic discharges- left posterior region  3. Continuous and generalized slowing.  Clinical Correlation: The findings of the study indicate: 1. Seizures were seen in the left posterior region.  The last seizure was  seen at 0601 July 12, 2017. 2. Moderate global, encephalopathy of nonspecific etiology.  These  etiologies would include, yet not limit, to metabolic, infectious, toxic,  or post-ictal causes     BRAIN MRI 09-2020 CLINICAL DATA:  Acute neuro deficit.  Rule out stroke  IMPRESSION: Negative for acute infarct   Generalized atrophy with mild white matter changes consistent with chronic microvascular ischemia   Central disc protrusions at C3-4 and C4-5.    Conleigh Heinlein A. Gerilyn Gross, M.D.  Diplomate, Biomedical engineer of Psychiatry and Neurology ( Neurology). 02/15/2021, 9:24 PM

## 2021-02-15 NOTE — Progress Notes (Addendum)
Palliative: Mrs. Bruinsma is lying quietly in bed.  She greets me making and somewhat keeping eye contact.  She appears chronically ill and frail.  She is alert, but with known memory loss.  She is oriented to self only today.  I believe that she can make her basic needs known.  There is no family at bedside at this time.  Conference with bedside nursing staff  Call to son/healthcare surrogate Horatio Pel.  We talked about Mrs. Chavis's current condition, neurology consultation and recommendations, anticipated improvements and timeframe for Parkinson's medications, time for outcomes.  At this point Hessie Diener states that he would want Mrs. Calef to return to [facility] with the benefits of hospice care.  He shares that he is facing some hard choices in the future.  He shares that he would not want to prolong his mother suffering.  Conference with attending, bedside nursing staff, transition of care team related to patient condition, needs, goals of care, disposition. [place]/DNR completed and placed on chart.  Plan:    Return to [facility] with the benefits of hospice.  Time for outcomes.  35 minutes  Lillia Carmel, NP Palliative medicine team Team phone [phone] Greater than 50% of this time was spent counseling and coordinating care related to the above assessment and plan.

## 2021-02-15 NOTE — Progress Notes (Signed)
[facility] night shift MedSurg coverage note.  The nursing staff reported that the patient is restless, confused and yelling "I can't get in, I can't get out."  She is unable to be reoriented and poses a risk of fall or pulling out IV catheter.  Lorazepam 0.5 mg IVP x1 dose ordered.  Sanda Klein, MD

## 2021-02-15 NOTE — Progress Notes (Signed)
PROGRESS NOTE   Brooke Gross  UXL:[id number] DOB: 12-10-1940 DOA: 02/13/2021 PCP: Galvin ProfferHague, Imran P, MD   Chief Complaint  Patient presents with   Aspiration   Level of care: Med-Surg  Brief Admission History:  80 y.o. female with medical history of epilepsy, cognitive impairment resulting from her epilepsy, bipolar disorder, diabetes mellitus type 2, dysphagia, hypertension, hyperlipidemia, stroke, and coronary artery disease presenting [facility] secondary to shortness of breath and hypoxia.  Attempts were made to contact the facility without any answer.  History is obtained from review of the medical record and speaking with the patient's son.  Son states that he has not been notified of any recent shortness of breath, fever, chills, chest pain.  Apparently, the patient had an aspirational type event while eating breakfast.  She was noted to be hypoxic.  EMS was activated.  In addition, EMS also noted the patient to have another aspirational event in route to the hospital.   Apparently, the patient was very somnolent and minimally responsive at the time of arrival.  Since arrival from her nursing facility, has become more alert in the emergency department while receiving antibiotics.  At the time of my evaluation, the patient is able to answer yes/no questions which is her baseline.  She is able to stand but not able to walk at baseline.  In the emergency department, the patient had low-grade temperature 99.3 F.  She was hemodynamically stable but tachypneic.  Oxygen saturation was 98% on 2L.  Patient was started on ceftriaxone and azithromycin in the emergency department.    Assessment & Plan:   Active Problems:   Acute respiratory failure with hypoxia (HCC)   Seizures (HCC)   Aspiration pneumonitis (HCC)   Acute metabolic encephalopathy   Respiratory distress   Hypoxia   Acute encephalopathy -this is likely multifactorial finding given metabolic abnormalities  and respiratory failure.  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  She has been started on new medication and clinically showing some improvement.   Epilepsy-appreciate neurology consultation and recommendations; plan repeat EEG 02/15/2021; keppra added by neurology.  Change to oral formulation 9/2.   Type 2 diabetes mellitus-continue SSI coverage and CBG monitoring. CBG (last 3)  Recent Labs    02/13/21 0726  GLUCAP 151*    Essential hypertension-stable and controlled on home medication.  Aspiration pneumonia-appreciate SLP evaluation and dysphagia diet recommendations continue IV Unasyn and azithromycin as ordered.  Anxiety and depression-patient currently is no longer on Remeron or olanzapine   DVT prophylaxis: enoxaparin  Code Status: DNR  Family Communication: son updated on 9/1 by telephone Disposition: anticipate return to SNF in 1-2 days. Status is: Inpatient  Remains inpatient appropriate because:IV treatments appropriate due to intensity of illness or inability to take PO and Inpatient level of care appropriate due to severity of illness  Dispo: The patient is from: SNF              Anticipated d/c is to: SNF              Patient currently is not medically stable to d/c.   Difficult to place patient No  Consultants:  Palliative care   Procedures:    Antimicrobials:    Subjective: More alert today.    Objective: Vitals:   02/15/21 0558 02/15/21 0722 02/15/21 0947 02/15/21 1420  BP: (!) 147/78  (!) 189/71 (!) 152/72  Pulse: (!) 49     Resp: 20     Temp: 97.8 F (  36.6 C)   97.9 F (36.6 C)  TempSrc: Oral   Oral  SpO2: 100% 100%  98%  Weight:      Gross:        Intake/Output Summary (Last 24 hours) at 02/15/2021 1521 Last data filed at 02/15/2021 0900 Gross per 24 hour  Intake 1226.67 ml  Output --  Net 1226.67 ml   Filed Weights   02/13/21 0714  Weight: 81.6 kg   Examination:  General exam: Pt somnolent, not arousing, Appears calm and  comfortable  Respiratory system: Clear to auscultation. Respiratory effort normal. Cardiovascular system: normal S1 & S2 heard. No JVD, murmurs, rubs, gallops or clicks. No pedal edema. Gastrointestinal system: Abdomen is nondistended, soft and nontender. No organomegaly or masses felt. Normal bowel sounds heard. Central nervous system: somnolent. No focal neurological deficits. Extremities: Symmetric 5 x 5 power. Skin: No gross lesions seen.  Psychiatry: Judgement and insight appear poor. Mood & affect UTD.   Data Reviewed: I have personally reviewed following labs and imaging studies  CBC: Recent Labs  Lab 02/13/21 0727 02/14/21 0440 02/15/21 0526  WBC 10.6* 13.2* 10.9*  NEUTROABS 8.4*  --  8.5*  HGB 10.8* 10.0* 9.5*  HCT 33.5* 30.7* 29.9*  MCV 94.9 94.8 95.8  PLT 284 278 271    Basic Metabolic Panel: Recent Labs  Lab 02/13/21 0727 02/14/21 0440 02/15/21 0526  NA 139 143 143  K 3.6 3.6 3.4*  CL 109 108 108  CO2 25 27 28   GLUCOSE 178* 146* 153*  BUN 13 9 8   CREATININE 0.86 0.82 0.71  CALCIUM 8.7* 8.6* 8.5*  MG  --  1.3* 2.0    GFR: Estimated Creatinine Clearance: 62.4 mL/min (by C-G formula based on SCr of 0.71 mg/dL).  Liver Function Tests: Recent Labs  Lab 02/13/21 0727 02/15/21 0526  AST 16 16  ALT 12 5  ALKPHOS 74 65  BILITOT 0.3 0.2*  PROT 6.5 5.8*  ALBUMIN 2.8* 2.5*    CBG: Recent Labs  Lab 02/13/21 0726  GLUCAP 151*    Recent Results (from the past 240 hour(s))  Resp Panel by RT-PCR (Flu A&B, Covid) Nasopharyngeal Swab     Status: None   Collection Time: 02/13/21 10:20 AM   Specimen: Nasopharyngeal Swab; Nasopharyngeal(NP) swabs in vial transport medium  Result Value Ref Range Status   SARS Coronavirus 2 by RT PCR NEGATIVE NEGATIVE Final    Comment: (NOTE) SARS-CoV-2 target nucleic acids are NOT DETECTED.  The SARS-CoV-2 RNA is generally detectable in upper respiratory specimens during the acute phase of infection. The  lowest concentration of SARS-CoV-2 viral copies this assay can detect is 138 copies/mL. A negative result does not preclude SARS-Cov-2 infection and should not be used as the sole basis for treatment or other patient management decisions. A negative result may occur with  improper specimen collection/handling, submission of specimen other than nasopharyngeal swab, presence of viral mutation(s) within the areas targeted by this assay, and inadequate number of viral copies(<138 copies/mL). A negative result must be combined with clinical observations, patient history, and epidemiological information. The expected result is Negative.  Fact Sheet for Patients:  02/15/21  Fact Sheet for Healthcare Providers:  02/15/21  This test is no t yet approved or cleared by the [employer] FDA and  has been authorized for detection and/or diagnosis of SARS-CoV-2 by FDA under an Emergency Use Authorization (EUA). This EUA will remain  in effect (meaning this test can be used) for the duration of  the COVID-19 declaration under Section 564(b)(1) of the Act, 21 U.S.C.section 360bbb-3(b)(1), unless the authorization is terminated  or revoked sooner.       Influenza A by PCR NEGATIVE NEGATIVE Final   Influenza B by PCR NEGATIVE NEGATIVE Final    Comment: (NOTE) The Xpert Xpress SARS-CoV-2/FLU/RSV plus assay is intended as an aid in the diagnosis of influenza from Nasopharyngeal swab specimens and should not be used as a sole basis for treatment. Nasal washings and aspirates are unacceptable for Xpert Xpress SARS-CoV-2/FLU/RSV testing.  Fact Sheet for Patients: [employer]com  Fact Sheet for Healthcare Providers: Serious[employer]ker.it  This test is not yet approved or cleared by the [place] FDA and has been authorized for detection and/or diagnosis of SARS-CoV-2 by FDA under  an Emergency Use Authorization (EUA). This EUA will remain in effect (meaning this test can be used) for the duration of the COVID-19 declaration under Section 564(b)(1) of the Act, 21 U.S.C. section 360bbb-3(b)(1), unless the authorization is terminated or revoked.  Performed at [facility], [address], [place], [place] [address]      Radiology Studies: DG CHEST PORT 1 VIEW  Result Date: 02/13/2021 CLINICAL DATA:  Respiratory distress. EXAM: PORTABLE CHEST 1 VIEW COMPARISON:  Same day. FINDINGS: The heart size and mediastinal contours are within normal limits. No pneumothorax is noted. Mild bibasilar subsegmental atelectasis is noted with probable small right pleural effusion. The visualized skeletal structures are unremarkable. IMPRESSION: Mild bibasilar subsegmental atelectasis with probable small right pleural effusion. Electronically Signed   By: Lupita Raider M.D.   On: 02/13/2021 18:53   EEG adult  Result Date: 02/13/2021 Charlsie Quest, MD     02/13/2021  4:56 PM Patient Name: Brooke Gross MRN: [id number] Epilepsy Attending: Charlsie Quest Referring Physician/Provider: Dr Onalee Hua Tat Date: 02/13/2021 Duration: 24.51 mins Patient history: 80yo F with h/o epilepsy and ams. EEG to evaulate for seizure. Level of alertness: Awake AEDs during EEG study: None Technical aspects: This EEG study was done with scalp electrodes positioned according to the 10-20 International system of electrode placement. Electrical activity was acquired at a sampling rate of 500Hz  and reviewed with a high frequency filter of 70Hz  and a low frequency filter of 1Hz . EEG data were recorded continuously and digitally stored. Description: No posterior dominant rhythm was seen. EEG showed continuous generalized 3 to 6 Hz theta-delta slowing. Abundant spikes were noted in left temporo-parietal region. Hyperventilation and photic stimulation were not performed.   ABNORMALITY -Spike, left temporo-parietal region  - Continuous slow, generalized IMPRESSION: This study showed evidence of epileptogenicity arising from left temporo-parietal region. There is also suggestive of moderate diffuse encephalopathy, nonspecific etiology. No seizures were seen throughout the recording. Priyanka    Scheduled Meds:  amLODipine  5 mg Oral Daily   aspirin  81 mg Oral Daily   carbidopa-levodopa  1 tablet Oral BID   enoxaparin (LOVENOX) injection  40 mg Subcutaneous Q24H   ipratropium-albuterol  3 mL Nebulization BID   lamoTRIgine  400 mg Oral BID   lisinopril  15 mg Oral Daily   Continuous Infusions:  ampicillin-sulbactam (UNASYN) IV 3 g (02/15/21 1444)   azithromycin 500 mg (02/15/21 0921)   lacosamide (VIMPAT) IV 200 mg (02/15/21 1448)   levETIRAcetam 500 mg (02/15/21 1224)     LOS: 2 days   Time spent: 35 mins   Norwood Quezada 04/17/21, MD How to contact the [facility] Attending or Consulting provider 7A - 7P or covering provider during after  hours 7P -7A, for this patient?  Check the care team in [facility] and look for a) attending/consulting TRH provider listed and b) the [facility] team listed Log into [email or web address] and use [place]'s universal password to access. If you do not have the password, please contact the hospital operator. Locate the [facility] provider you are looking for under [facility] and page to a number that you can be directly reached. If you still have difficulty reaching the provider, please page the [facility] (Director on Call) for the Hospitalists listed on amion for assistance.  02/15/2021, 3:21 PM

## 2021-02-15 NOTE — Progress Notes (Signed)
   02/14/21 1856  Assess: MEWS Score  BP (!) 175/66  Pulse Rate (!) 59  Resp (!) 24  SpO2 (!) 87 % (respiratory called)  O2 Device Nasal Cannula  O2 Flow Rate (L/min) 2 L/min  Assess: MEWS Score  MEWS Temp 0  MEWS Systolic 0  MEWS Pulse 0  MEWS RR 1  MEWS LOC 1  MEWS Score 2  MEWS Score Color Yellow  Assess: if the MEWS score is Yellow or Red  Were vital signs taken at a resting state? Yes  Focused Assessment Change from prior assessment (see assessment flowsheet)  Early Detection of Sepsis Score *See Row Information* Low  MEWS guidelines implemented *See Row Information* No, previously yellow, continue vital signs every 4 hours  Treat  MEWS Interventions Consulted Respiratory Therapy  Pain Scale Faces  Pain Score 0  Take Vital Signs  Increase Vital Sign Frequency  Yellow: Q 2hr X 2 then Q 4hr X 2, if remains yellow, continue Q 4hrs  Escalate  MEWS: Escalate Yellow: discuss with charge nurse/RN and consider discussing with provider and RRT  Notify: Charge Nurse/RN  Name of Charge Nurse/RN Notified Darshay Sine RN  Date Charge Nurse/RN Notified 02/14/21  Time Charge Nurse/RN Notified 1911  Notify: Provider  Provider Name/Title Dr Laural Benes  Date Provider Notified 02/14/21  Time Provider Notified [phone]  Notification Type Page (secure chat)  Notification Reason Other (Comment) (O2 sat)  Provider response Other (Comment)  Date of Provider Response 02/14/21  Document  Patient Outcome Stabilized after interventions

## 2021-02-16 ENCOUNTER — Inpatient Hospital Stay (HOSPITAL_COMMUNITY)
Admit: 2021-02-16 | Discharge: 2021-02-16 | Disposition: A | Discharge status: Home / Self Care | Payer: Medicare Other | Attending: Family Medicine | Admitting: Family Medicine

## 2021-02-16 DIAGNOSIS — R4182 Altered mental status, unspecified: Secondary | ICD-10-CM

## 2021-02-16 LAB — URINE CULTURE: Culture: NO GROWTH

## 2021-02-16 MED ORDER — GLYCOPYRROLATE 0.2 MG/ML IJ SOLN: 0.2000 mg | INTRAMUSCULAR | Status: DC | PRN

## 2021-02-16 MED ORDER — MORPHINE SULFATE (CONCENTRATE) 10 MG/0.5ML PO SOLN: 5.0000 mg | ORAL | Status: DC

## 2021-02-16 MED ORDER — LORAZEPAM 1 MG PO TABS: 1.0000 mg | ORAL_TABLET | ORAL | Status: DC | PRN

## 2021-02-16 MED ORDER — LORAZEPAM 2 MG/ML IJ SOLN
1.0000 mg | INTRAMUSCULAR | Status: DC | PRN
  Administered 2021-02-19 (×2): 1 mg via INTRAVENOUS
  Filled 2021-02-16 (×2): qty 1

## 2021-02-16 MED ORDER — GLYCOPYRROLATE 1 MG PO TABS: 1.0000 mg | ORAL_TABLET | ORAL | Status: DC | PRN

## 2021-02-16 MED ORDER — BISACODYL 10 MG RE SUPP: 10.0000 mg | Freq: Every day | RECTAL | Status: DC | PRN

## 2021-02-16 MED ORDER — LEVETIRACETAM IN NACL 500 MG/100ML IV SOLN
500.0000 mg | Freq: Three times a day (TID) | INTRAVENOUS | Status: DC
  Administered 2021-02-16: 500 mg via INTRAVENOUS
  Filled 2021-02-16: qty 100

## 2021-02-16 MED ORDER — ONDANSETRON 4 MG PO TBDP: 4.0000 mg | ORAL_TABLET | Freq: Four times a day (QID) | ORAL | Status: DC | PRN

## 2021-02-16 MED ORDER — HALOPERIDOL LACTATE 2 MG/ML PO CONC: 0.5000 mg | ORAL | Status: DC | PRN

## 2021-02-16 MED ORDER — SODIUM CHLORIDE 0.9 % IV SOLN
200.0000 mg | Freq: Two times a day (BID) | INTRAVENOUS | Status: DC
  Administered 2021-02-16: 200 mg via INTRAVENOUS
  Filled 2021-02-16 (×7): qty 20

## 2021-02-16 MED ORDER — LORAZEPAM 2 MG/ML IJ SOLN
1.0000 mg | INTRAMUSCULAR | Status: DC | PRN
  Administered 2021-02-16: 1 mg via INTRAVENOUS
  Filled 2021-02-16: qty 1

## 2021-02-16 MED ORDER — ACETAMINOPHEN 650 MG RE SUPP
650.0000 mg | Freq: Four times a day (QID) | RECTAL | Status: DC | PRN
  Administered 2021-02-19 (×2): 650 mg via RECTAL
  Filled 2021-02-16: qty 1

## 2021-02-16 MED ORDER — HALOPERIDOL LACTATE 5 MG/ML IJ SOLN: 0.5000 mg | INTRAMUSCULAR | Status: DC | PRN

## 2021-02-16 MED ORDER — DIPHENHYDRAMINE HCL 50 MG/ML IJ SOLN: 12.5000 mg | INTRAMUSCULAR | Status: DC | PRN

## 2021-02-16 MED ORDER — MORPHINE SULFATE (CONCENTRATE) 10 MG/0.5ML PO SOLN
5.0000 mg | ORAL | Status: DC | PRN
  Filled 2021-02-16: qty 0.5

## 2021-02-16 MED ORDER — POLYVINYL ALCOHOL 1.4 % OP SOLN: 1.0000 [drp] | Freq: Four times a day (QID) | OPHTHALMIC | Status: DC | PRN

## 2021-02-16 MED ORDER — HALOPERIDOL 0.5 MG PO TABS: 0.5000 mg | ORAL_TABLET | ORAL | Status: DC | PRN

## 2021-02-16 MED ORDER — ONDANSETRON HCL 4 MG/2ML IJ SOLN: 4.0000 mg | Freq: Four times a day (QID) | INTRAMUSCULAR | Status: DC | PRN

## 2021-02-16 MED ORDER — LEVETIRACETAM 500 MG PO TABS
500.0000 mg | ORAL_TABLET | Freq: Three times a day (TID) | ORAL | Status: DC
  Filled 2021-02-16: qty 1

## 2021-02-16 MED ORDER — LORAZEPAM 2 MG/ML PO CONC: 1.0000 mg | ORAL | Status: DC | PRN

## 2021-02-16 MED ORDER — LORAZEPAM 2 MG/ML IJ SOLN: 1.0000 mg | INTRAMUSCULAR | Status: DC | PRN

## 2021-02-16 MED ORDER — ACETAMINOPHEN 325 MG PO TABS: 650.0000 mg | ORAL_TABLET | Freq: Four times a day (QID) | ORAL | Status: DC | PRN

## 2021-02-16 MED ORDER — MORPHINE SULFATE (CONCENTRATE) 10 MG/0.5ML PO SOLN
5.0000 mg | ORAL | Status: DC | PRN
  Administered 2021-02-16 – 2021-02-19 (×3): 5 mg via SUBLINGUAL
  Filled 2021-02-16 (×2): qty 0.5

## 2021-02-16 MED ORDER — LACOSAMIDE 50 MG PO TABS
200.0000 mg | ORAL_TABLET | Freq: Two times a day (BID) | ORAL | Status: DC
  Filled 2021-02-16: qty 4

## 2021-02-16 NOTE — Procedures (Signed)
Patient Name: Yan Okray  MRN: [id number]  Epilepsy Attending: Charlsie Quest  Referring Physician/Provider: Dr Standley Dakins Date: 02/16/2021 Duration: 25.14 mins  Patient history: 80 year old female with history of epilepsy presented with altered mental status.  EEG to evaluate for seizures.   Level of alertness: Awake  AEDs during EEG study: Keppra, lacosamide, lamotrigine  Technical aspects: This EEG study was done with scalp electrodes positioned according to the 10-20 International system of electrode placement. Electrical activity was acquired at a sampling rate of 500Hz  and reviewed with a high frequency filter of 70Hz  and a low frequency filter of 1Hz . EEG data were recorded continuously and digitally stored.   Description: No clear posterior dominant rhythm was seen. EEG showed continuous generalized 5 to 6 Hz theta slowing. Hyperventilation and photic stimulation were not performed.     Of note, study was technically difficult due to significant eye flutter artifact.   ABNORMALITY - Continuous slow, generalized  IMPRESSION: This technically difficult study is suggestive of mild to moderate diffuse encephalopathy, nonspecific etiology. No seizures or epileptiform discharges were seen throughout the recording.  If concern for ictal-interictal activity persists, please consider repeat study.   Kyliana Standen 

## 2021-02-16 NOTE — Progress Notes (Signed)
02/16/2021 2:45 PM  T/C to son and updated later from social worker patient's son decided to pursue full comfort care as patient no longer eating or drinking and not taking her tablets anymore. Given her severe epilepsy and advanced parkinsonism and not being able to take Sinemet I agree with hospice full comfort measures and anticipate less than 2 weeks prognosis.    Brooke Manuel, MD How to contact the [facility] Attending or Consulting provider 7A - 7P or covering provider during after hours 7P -7A, for this patient?  Check the care team in [facility] and look for a) attending/consulting TRH provider listed and b) the [facility] team listed Log into [email or web address] and use [place]'s universal password to access. If you do not have the password, please contact the hospital operator. Locate the [facility] provider you are looking for under [facility] and page to a number that you can be directly reached. If you still have difficulty reaching the provider, please page the [facility] (Director on Call) for the Hospitalists listed on amion for assistance.

## 2021-02-16 NOTE — Progress Notes (Signed)
Civil engineer, contracting [facility]) laison note  Referral received from [facility] for outpatient palliative services at [facility], [place], after discharge.  ACC liaisons will continue to follow to assist with discharge planning and to coordinate admission onto palliative services.  Gillian Scarce, BSN, RN [facility] liaison [phone]

## 2021-02-16 NOTE — [place] FL2 (Signed)
[place] MEDICAID FL2 LEVEL OF CARE SCREENING TOOL     IDENTIFICATION  Patient Name: Brooke Gross Birthdate: [birth year] Sex: female Admission Date (Current Location): 02/13/2021  [place] and [place] Number:  Aaron Edelman [id number] N Facility and Address:  [facility],  [address] [place] [address]      Provider Number: [id number]  Attending Physician Name and Address:  Cleora Fleet, MD  Relative Name and Phone Number:  Lynford Humphrey (Son)   [phone]    Current Level of Care: Hospital Recommended Level of Care: Skilled Nursing Facility Prior Approval Number:    Date Approved/Denied:   PASRR Number:    Discharge Plan: SNF    Current Diagnoses: Patient Active Problem List   Diagnosis Date Noted   Hypoxia    Aspiration pneumonitis (HCC) 02/13/2021   Acute metabolic encephalopathy 02/13/2021   Respiratory distress    Bradycardia 09/22/2020   Hypothermia 09/22/2020   Hyperglycemia due to diabetes mellitus (HCC) 09/22/2020   AKI (acute kidney injury) (HCC) 09/22/2020   Hyperlipidemia 09/22/2020   GERD (gastroesophageal reflux disease) 09/22/2020   Altered mental status 09/22/2020   Cerebrovascular accident (CVA) (HCC) 09/18/2017   Seizures (HCC) 03/17/2017   Memory loss 03/17/2017   Gait abnormality 03/17/2017   Seizure disorder (HCC) 04/22/2014   Anxiety and depression 04/22/2014   Acute respiratory failure with hypoxia (HCC) 04/13/2014   Severe sepsis with septic shock (HCC) 04/13/2014   Anemia, iron deficiency 04/13/2014   Essential hypertension    Status epilepticus (HCC) 03/18/2014   Non-ST elevation myocardial infarction (NSTEMI), subsequent episode of care (HCC) 03/18/2014   Diabetes mellitus type 2, controlled, without complications (HCC)    Abnormality of gait 12/15/2013    Orientation RESPIRATION BLADDER Height & Weight     Self  Normal Incontinent Weight: 180 lb (81.6 kg) Height:  5\' 6"  (167.6 cm)   BEHAVIORAL SYMPTOMS/MOOD NEUROLOGICAL BOWEL NUTRITION STATUS      Incontinent Diet (DYS 2)  AMBULATORY STATUS COMMUNICATION OF NEEDS Skin   Extensive Assist (uses wc in facility) Verbally Normal                       Personal Care Assistance Level of Assistance  Bathing, Feeding, Dressing Bathing Assistance: Maximum assistance Feeding assistance: Limited assistance Dressing Assistance: Maximum assistance     Functional Limitations Info  Sight, Hearing, Speech Sight Info: Adequate Hearing Info: Adequate Speech Info: Adequate    SPECIAL CARE FACTORS FREQUENCY                       Contractures Contractures Info: Not present    Additional Factors Info  Code Status, Allergies, Psychotropic Code Status Info: DNR Allergies Info: Tramadol, Aspirin Psychotropic Info: Ativan, Remeron         Current Medications (02/16/2021):  This is the current hospital active medication list Current Facility-Administered Medications  Medication Dose Route Frequency Provider Last Rate Last Admin   acetaminophen (TYLENOL) tablet 650 mg  650 mg Oral Q6H PRN Tat, David, MD       Or   acetaminophen (TYLENOL) suppository 650 mg  650 mg Rectal Q6H PRN Tat, 04/18/2021, MD       amLODipine (NORVASC) tablet 5 mg  5 mg Oral Daily Johnson, Clanford L, MD   5 mg at 02/15/21 0947   Ampicillin-Sulbactam (UNASYN) 3 g in sodium chloride 0.9 % 100 mL IVPB  3 g Intravenous 04/17/21, MD 200 mL/hr at 02/16/21 [phone]  3 g at 02/16/21 0447   aspirin chewable tablet 81 mg  81 mg Oral Daily Johnson, Clanford L, MD   81 mg at 02/15/21 0947   carbidopa-levodopa (SINEMET IR) 25-100 MG per tablet immediate release 1 tablet  1 tablet Oral TID Beryle Beams, MD       Followed by   Melene Muller ON 02/19/2021] carbidopa-levodopa (SINEMET IR) 25-100 MG per tablet immediate release 1 tablet  1 tablet Oral QID Beryle Beams, MD       enoxaparin (LOVENOX) injection 40 mg  40 mg Subcutaneous Q24H Tat, Onalee Hua, MD   40 mg at  02/15/21 2123   food thickener (SIMPLYTHICK (NECTAR/LEVEL 2/MILDLY THICK)) 1 packet  1 packet Oral PRN Johnson, Clanford L, MD       hydrALAZINE (APRESOLINE) injection 10 mg  10 mg Intravenous Q6H PRN Tat, Onalee Hua, MD       ipratropium-albuterol (DUONEB) 0.5-2.5 (3) MG/3ML nebulizer solution 3 mL  3 mL Nebulization BID Johnson, Clanford L, MD   3 mL at 02/16/21 0734   ipratropium-albuterol (DUONEB) 0.5-2.5 (3) MG/3ML nebulizer solution 3 mL  3 mL Nebulization Q6H PRN Johnson, Clanford L, MD       lacosamide (VIMPAT) 200 mg in sodium chloride 0.9 % 25 mL IVPB  200 mg Intravenous Q12H Johnson, Clanford L, MD       lamoTRIgine (LAMICTAL) tablet 400 mg  400 mg Oral BID Tat, David, MD   400 mg at 02/15/21 2122   levETIRAcetam (KEPPRA) IVPB 500 mg/100 mL premix  500 mg Intravenous TID Johnson, Clanford L, MD       lisinopril (ZESTRIL) tablet 15 mg  15 mg Oral Daily Johnson, Clanford L, MD   15 mg at 02/15/21 0947   ondansetron (ZOFRAN) tablet 4 mg  4 mg Oral Q6H PRN Tat, David, MD       Or   ondansetron (ZOFRAN) injection 4 mg  4 mg Intravenous Q6H PRN Tat, Onalee Hua, MD   4 mg at 02/15/21 1740     Discharge Medications: Please see discharge summary for a list of discharge medications.  Relevant Imaging Results:  Relevant Lab Results:   Additional Information    Audryna Wendt, Juleen China, LCSW

## 2021-02-16 NOTE — TOC Initial Note (Signed)
Transition of Care [facility]) - Initial/Assessment Note    Patient Details  Name: Brooke Gross MRN: [id number] Date of Birth: 1941-01-09  Transition of Care [facility]) CM/SW Contact:    Annice Needy, LCSW Phone Number: 02/16/2021, 12:16 PM  Clinical Narrative:                 [facility] Admitted to facility on 08/02/19. Requires extensive assistance with ADLS. Uses wheelchair. Patient's son has been meeting with palliative NP and per palliative care NP would like palliative to follow patient in facility. Chooses Palliative care facility uses which is [facility].  Tabitha, facility social work Geophysicist/field seismologist, advised that patient will discharge today or over the weekend.  Referral for palliative made with [facility] with Chrislyn.    Expected Discharge Plan: Skilled Nursing Facility Barriers to Discharge: Continued Medical Work up   Patient Goals and CMS Choice Patient states their goals for this hospitalization and ongoing recovery are:: return to facility with [facility] Palliative following      Expected Discharge Plan and Services Expected Discharge Plan: Skilled Nursing Facility     Post Acute Care Choice:  (Palliative) Living arrangements for the past 2 months: Skilled Nursing Facility                                      Prior Living Arrangements/Services Living arrangements for the past 2 months: Skilled Nursing Facility Lives with:: Facility Resident Patient language and need for interpreter reviewed:: Yes Do you feel safe going back to the place where you live?: Yes      Need for Family Participation in Patient Care: Yes (Comment) Care giver support system in place?: Yes (comment)   Criminal Activity/Legal Involvement Pertinent to Current Situation/Hospitalization: No - Comment as needed  Activities of Daily Living Home Assistive Devices/Equipment: Wheelchair ADL Screening (condition at time of admission) Patient's  cognitive ability adequate to safely complete daily activities?: No Is the patient deaf or have difficulty hearing?: Yes Does the patient have difficulty seeing, even when wearing glasses/contacts?: No Does the patient have difficulty concentrating, remembering, or making decisions?: Yes Patient able to express need for assistance with ADLs?: No Does the patient have difficulty dressing or bathing?: Yes Independently performs ADLs?: No Communication: Needs assistance Is this a change from baseline?: Pre-admission baseline Dressing (OT): Dependent Is this a change from baseline?: Pre-admission baseline Grooming: Dependent Is this a change from baseline?: Pre-admission baseline Feeding: Needs assistance Is this a change from baseline?: Pre-admission baseline Bathing: Dependent Is this a change from baseline?: Pre-admission baseline Toileting: Dependent Is this a change from baseline?: Pre-admission baseline In/Out Bed: Dependent Is this a change from baseline?: Pre-admission baseline Walks in Home: Dependent Is this a change from baseline?: Pre-admission baseline Does the patient have difficulty walking or climbing stairs?: Yes Weakness of Legs: Both Weakness of Arms/Hands: Both  Permission Sought/Granted Permission sought to share information with : Facility Medical sales representative    Share Information with NAME: Johny Blamer, facility social work Psychologist, counselling Assessment     Affect (typically observed): Appropriate Orientation: : Oriented to Self Alcohol / Substance Use: Not Applicable Psych Involvement: No (comment)  Admission diagnosis:  Hypoxia [R09.02] Acute respiratory failure with hypoxia (HCC) [J96.01] Patient Active Problem List   Diagnosis Date Noted   Hypoxia    Aspiration pneumonitis (HCC) 02/13/2021  Acute metabolic encephalopathy 02/13/2021   Respiratory distress    Bradycardia 09/22/2020   Hypothermia 09/22/2020   Hyperglycemia due  to diabetes mellitus (HCC) 09/22/2020   AKI (acute kidney injury) (HCC) 09/22/2020   Hyperlipidemia 09/22/2020   GERD (gastroesophageal reflux disease) 09/22/2020   Altered mental status 09/22/2020   Cerebrovascular accident (CVA) (HCC) 09/18/2017   Seizures (HCC) 03/17/2017   Memory loss 03/17/2017   Gait abnormality 03/17/2017   Seizure disorder (HCC) 04/22/2014   Anxiety and depression 04/22/2014   Acute respiratory failure with hypoxia (HCC) 04/13/2014   Severe sepsis with septic shock (HCC) 04/13/2014   Anemia, iron deficiency 04/13/2014   Essential hypertension    Status epilepticus (HCC) 03/18/2014   Non-ST elevation myocardial infarction (NSTEMI), subsequent episode of care [facility]) 03/18/2014   Diabetes mellitus type 2, controlled, without complications (HCC)    Abnormality of gait 12/15/2013   PCP:  Galvin Proffer, MD Pharmacy:   [facility] [address], [place] - [address] [place] [address] Phone: [phone] Fax: [phone]     Social Determinants of Health (SDOH) Interventions    Readmission Risk Interventions No flowsheet data found.

## 2021-02-16 NOTE — Progress Notes (Signed)
PROGRESS NOTE   Brooke Gross  YIF:[id number] DOB: 21-Sep-1940 DOA: 02/13/2021 PCP: Galvin Proffer, MD   Chief Complaint  Patient presents with   Aspiration   Level of care: Med-Surg  Brief Admission History:  80 y.o. 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  Attempts were made to contact the facility without any answer.  History is obtained from review of the medical record and speaking with the patient's son.  Son states that he has not been notified of any recent shortness of breath, fever, chills, chest pain.  Apparently, the patient had an aspirational type event while eating breakfast.  She was noted to be hypoxic.  EMS was activated.  In addition, EMS also noted the patient to have another aspirational event in route to the hospital.   Apparently, the patient was very somnolent and minimally responsive at the time of arrival.  Since arrival from her nursing facility, has become more alert in the emergency department while receiving antibiotics.  At the time of my evaluation, the patient is able to answer yes/no questions which is her baseline.  She is able to stand but not able to walk at baseline.  In the emergency department, the patient had low-grade temperature 99.3 F.  She was hemodynamically stable but tachypneic.  Oxygen saturation was 98% on 2L.  Patient was started on ceftriaxone and azithromycin in the emergency department.    Assessment & Plan:   Active Problems:   Acute respiratory failure with hypoxia (HCC)   Seizures (HCC)   Aspiration pneumonitis (HCC)   Acute metabolic encephalopathy   Respiratory distress   Hypoxia  Acute encephalopathy -this is likely multifactorial finding given metabolic abnormalities  and respiratory failure.  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  She has been started on new medication and clinically showing some improvement.  Now that patient is no longer taking pills and no longer eating and drinking and pocketing food and pills in the mouth I had a long discussion with her son and he understands now that she likely is not going to have a meaningful improvement or recovery if she is not able to take her medications.  He does not want a feeding tube placed and says that his mother would not want this.  After consideration we gave him time and he notified the social worker after counseling that he would like to proceed with full comfort measures and hospice home placement.  He is interested in [facility].  If patient continues to refuse to eat and drink then I anticipate prognosis less than 2 weeks.  Full comfort measures ordered.  Focus of care is on comfort and dignity at this time.  Son is in full agreement and we have initiated the orders.  Epilepsy-appreciate neurology consultation and recommendations; patient has now transitioned to full comfort measures.  Type 2 diabetes mellitus-continue SSI coverage and CBG monitoring. Comfort measures  Essential hypertension-full comfort measures.   Aspiration pneumonia-appreciate SLP evaluation and dysphagia diet recommendations.  Patient has now transitioned to full comfort measures.  Anxiety and depression-lorazepam ordered as part of full comfort care plan  DVT prophylaxis: full comfort care Code Status: DNR  Family Communication: son updated on 9/1, 9/2 Disposition: family hopeful to transfer to hospice Status is: Inpatient  Remains inpatient appropriate because:IV treatments appropriate due  to intensity of illness or inability to take PO and Inpatient level of care appropriate due to severity of illness  Dispo: The patient is from: SNF              Anticipated d/c is to: SNF              Patient currently  is not medically stable to d/c.   Difficult to place patient No  Consultants:  Palliative care   Procedures:    Antimicrobials:    Subjective Unfortunately patient is not taking her pills anymore and not eating or drinking, She is pocketing pills and food in mouth and not swallowing.     Objective: Vitals:   02/15/21 2200 02/16/21 0500 02/16/21 0735 02/16/21 1355  BP:  (!) 152/79  102/61  Pulse:  (!) 55  66  Resp:  18  17  Temp: 97.6 F (36.4 C) 98.3 F (36.8 C)  98.4 F (36.9 C)  TempSrc: Oral Oral    SpO2:  98% 98% 98%  Weight:      Height:        Intake/Output Summary (Last 24 hours) at 02/16/2021 1636 Last data filed at 02/16/2021 1520 Gross per 24 hour  Intake 462.92 ml  Output 150 ml  Net 312.92 ml   Filed Weights   02/13/21 0714  Weight: 81.6 kg   Examination:  General exam: Pt somnolent, not arousing, Appears calm and comfortable  Respiratory system: Clear to auscultation. Respiratory effort normal. Cardiovascular system: normal S1 & S2 heard. No JVD, murmurs, rubs, gallops or clicks. No pedal edema. Gastrointestinal system: Abdomen is nondistended, soft and nontender. No organomegaly or masses felt. Normal bowel sounds heard. Central nervous system: somnolent. No focal neurological deficits. Extremities: Symmetric 5 x 5 power. Skin: No gross lesions seen.  Psychiatry: Judgement and insight UTD.   Data Reviewed: I have personally reviewed following labs and imaging studies  CBC: Recent Labs  Lab 02/13/21 0727 02/14/21 0440 02/15/21 0526  WBC 10.6* 13.2* 10.9*  NEUTROABS 8.4*  --  8.5*  HGB 10.8* 10.0* 9.5*  HCT 33.5* 30.7* 29.9*  MCV 94.9 94.8 95.8  PLT 284 278 271    Basic Metabolic Panel: Recent Labs  Lab 02/13/21 0727 02/14/21 0440 02/15/21 0526  NA 139 143 143  K 3.6 3.6 3.4*  CL 109 108 108  CO2 25 27 28   GLUCOSE 178* 146* 153*  BUN 13 9 8   CREATININE 0.86 0.82 0.71  CALCIUM 8.7* 8.6* 8.5*  MG  --  1.3* 2.0     GFR: Estimated Creatinine Clearance: 62.4 mL/min (by C-G formula based on SCr of 0.71 mg/dL).  Liver Function Tests: Recent Labs  Lab 02/13/21 0727 02/15/21 0526  AST 16 16  ALT 12 5  ALKPHOS 74 65  BILITOT 0.3 0.2*  PROT 6.5 5.8*  ALBUMIN 2.8* 2.5*    CBG: Recent Labs  Lab 02/13/21 0726  GLUCAP 151*    Recent Results (from the past 240 hour(s))  Resp Panel by RT-PCR (Flu A&B, Covid) Nasopharyngeal Swab     Status: None   Collection Time: 02/13/21 10:20 AM   Specimen: Nasopharyngeal Swab; Nasopharyngeal(NP) swabs in vial transport medium  Result Value Ref Range Status   SARS Coronavirus 2 by RT PCR NEGATIVE NEGATIVE Final    Comment: (NOTE) SARS-CoV-2 target nucleic acids are NOT DETECTED.  The SARS-CoV-2 RNA is generally detectable in upper respiratory specimens during the acute phase of infection. The lowest concentration of SARS-CoV-2 viral  copies this assay can detect is 138 copies/mL. A negative result does not preclude SARS-Cov-2 infection and should not be used as the sole basis for treatment or other patient management decisions. A negative result may occur with  improper specimen collection/handling, submission of specimen other than nasopharyngeal swab, presence of viral mutation(s) within the areas targeted by this assay, and inadequate number of viral copies(<138 copies/mL). A negative result must be combined with clinical observations, patient history, and epidemiological information. The expected result is Negative.  Fact Sheet for Patients:  BloggerCourse.com  Fact Sheet for Healthcare Providers:  SeriousBroker.it  This test is no t yet approved or cleared by the [place] FDA and  has been authorized for detection and/or diagnosis of SARS-CoV-2 by FDA under an Emergency Use Authorization (EUA). This EUA will remain  in effect (meaning this test can be used) for the duration of  the COVID-19 declaration under Section 564(b)(1) of the Act, 21 U.S.C.section 360bbb-3(b)(1), unless the authorization is terminated  or revoked sooner.       Influenza A by PCR NEGATIVE NEGATIVE Final   Influenza B by PCR NEGATIVE NEGATIVE Final    Comment: (NOTE) The Xpert Xpress SARS-CoV-2/FLU/RSV plus assay is intended as an aid in the diagnosis of influenza from Nasopharyngeal swab specimens and should not be used as a sole basis for treatment. Nasal washings and aspirates are unacceptable for Xpert Xpress SARS-CoV-2/FLU/RSV testing.  Fact Sheet for Patients: BloggerCourse.com  Fact Sheet for Healthcare Providers: SeriousBroker.it  This test is not yet approved or cleared by the [place] FDA and has been authorized for detection and/or diagnosis of SARS-CoV-2 by FDA under an Emergency Use Authorization (EUA). This EUA will remain in effect (meaning this test can be used) for the duration of the COVID-19 declaration under Section 564(b)(1) of the Act, 21 U.S.C. section 360bbb-3(b)(1), unless the authorization is terminated or revoked.  Performed at [facility], [address] [place], [place] [address]   Urine Culture     Status: None   Collection Time: 02/14/21 11:33 AM   Specimen: Urine, Clean Catch  Result Value Ref Range Status   Specimen Description   Final    URINE, CLEAN CATCH Performed at [facility], [address] [place], [place] [address]    Special Requests   Final    NONE Performed at [facility], [address] [place], [place] [address]    Culture   Final    NO GROWTH Performed at [facility], [address], [place], [place] [address]    Report Status 02/16/2021 FINAL  Final     Radiology Studies: No results found.  Scheduled Meds:  lisinopril  15 mg Oral Daily   morphine CONCENTRATE  5 mg Oral Q4H   Or   morphine CONCENTRATE  5 mg Sublingual Q4H   Continuous Infusions:    LOS: 3 days   Time spent: 35 mins   Chablis Losh Laural Benes, MD How to contact the [facility] Attending or Consulting provider 7A - 7P or covering provider during after hours 7P -7A, for this patient?  Check the care team in [facility] and look for a) attending/consulting TRH provider listed and b) the [facility] team listed Log into [email or web address] and use [place]'s universal password to access. If you do not have the password, please contact the hospital operator. Locate the [facility] provider you are looking for under [facility] and page to a number that you can be directly reached. If you still have  difficulty reaching the provider, please page the [facility] (Director on Call) for the Hospitalists listed on amion for assistance.  02/16/2021, 4:36 PM

## 2021-02-16 NOTE — TOC Progression Note (Signed)
Transition of Care [facility]) - Progression Note    Patient Details  Name: Brooke Gross MRN: [id number] Date of Birth: 03-25-1941  Transition of Care [facility]) CM/SW Contact  Annice Needy, LCSW Phone Number: 02/16/2021, 3:22 PM  Clinical Narrative:    Ashok Norris, advises that he has made the decision to have patient go to a hospice home rather than return to the facility with hospice. Message left for [facility] referral line advising of change to referral made earlier today from return to SNF with hospice to referral for residential hospice.   Expected Discharge Plan: Skilled Nursing Facility Barriers to Discharge: Continued Medical Work up  Expected Discharge Plan and Services Expected Discharge Plan: Skilled Nursing Facility     Post Acute Care Choice:  (Palliative) Living arrangements for the past 2 months: Skilled Nursing Facility                                       Social Determinants of Health (SDOH) Interventions    Readmission Risk Interventions No flowsheet data found.

## 2021-02-16 NOTE — Progress Notes (Signed)
Civil engineer, contracting [facility]) Hospital Liaison note.      Received request from [facility] manager for family interest in [facility]. Spoke with son, Hessie Diener who is out of state, and he is interested in [facility] due to location.  He wants to think about his options and see if [facility] will have availability prior to choosing.      A Please do not hesitate to call with questions.    Thank you,    Elsie Saas, RN, [facility]       [facility] Liaison (listed on [facility] under Hospice /[facility])     [phone]

## 2021-02-16 NOTE — Progress Notes (Signed)
EEG Completed; Results Pending  

## 2021-02-16 NOTE — Care Management Important Message (Signed)
Important Message  Patient Details  Name: Brooke Gross MRN: [id number] Date of Birth: 11/19/1940   Medicare Important Message Given:  Yes     Corey Harold 02/16/2021, 1:55 PM

## 2021-02-16 NOTE — Progress Notes (Signed)
  Speech Language Pathology Treatment:    Patient Details Name: Brooke Gross MRN: [id number] DOB: 1941-06-13 Today's Date: 02/16/2021 Time: 4196-2229 SLP Time Calculation (min) (ACUTE ONLY): 20 min  Assessment / Plan / Recommendation Clinical Impression  Ongoing diagnostic dysphagia treatment provided today; breakfast tray present in room after NT had attempted to feed Pt with little success (reports of oral holding and biting presented utensil). SLP attempted presentations of puree and Pt accepted one bite initially but then demonstrated oral holding for an extended period of time despite max verbal cues and multiple attempts to present NTL to facilitate oral clearance of bolus. Eventually (~5 mins later) Pt did accept cup sips of NTL and seemed to clear oral cavity, however Pt did was not responsive to opening her mouth after this. Suspect oral holding is related to mentation and fluctuating mentation, however note Pt is at high risk for aspiration, dehydration and malnutrition. Further, Pt is not appropriate for MBS at this time secondary to lack of desire/ability to actually consume presented textures. Recommend continue current diet of D2/NTL, however, do not feed Pt if she is not responsive and attentive to PO. Terminate feeding if Pt is severely holding presentations and check oral cavity after all presentations for oral residue. Recommend crush meds in puree. ST will continue to follow acutely   HPI HPI: Brooke Gross is a 80 y.o. 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  Attempts were made to contact the facility without any answer.  History is obtained from review of the medical record and speaking with the patient's son.   Son states that he has not been notified of any recent shortness of breath, fever, chills, chest pain.  Apparently, the patient had an aspirational type event while eating breakfast.  She was noted to be hypoxic.  EMS was activated.  In addition, EMS also noted the patient to have another aspirational event in route to the hospital. BSE requested. Pt had BSE in 2015. She has been residing at the [facility] in [place] and was recently discharged from [facility]. RT suctioned last night and Pt with improved alertness following.      SLP Plan  Continue with current plan of care       Recommendations  Diet recommendations: Nectar-thick liquid;Dysphagia 2 (fine chop) Liquids provided via: Cup Medication Administration: Crushed with puree Supervision: Full supervision/cueing for compensatory strategies Compensations: Slow rate;Small sips/bites                Oral Care Recommendations: Oral care BID;Staff/trained caregiver to provide oral care Follow up Recommendations: 24 hour supervision/assistance SLP Visit Diagnosis: Dysphagia, unspecified (R13.10) Plan: Continue with current plan of care       Thekla Colborn H. Romie Levee, CCC-SLP Speech Language Pathologist    Georgetta Haber 02/16/2021, 9:43 AM

## 2021-02-17 NOTE — TOC Progression Note (Signed)
Transition of Care [facility]) - Progression Note    Patient Details  Name: Brooke Gross MRN: [id number] Date of Birth: 1940/09/27  Transition of Care [facility]) CM/SW Contact  Barry Brunner, LCSW Phone Number: 02/17/2021, 2:00 PM  Clinical Narrative:    CSW contacted [facility] for patient's discharge. [facility] reported that referral just received. [facility] reported that they are processing referral to [facility]. TOC to follow.   Expected Discharge Plan: Skilled Nursing Facility Barriers to Discharge: Continued Medical Work up  Expected Discharge Plan and Services Expected Discharge Plan: Skilled Nursing Facility     Post Acute Care Choice:  (Palliative) Living arrangements for the past 2 months: Skilled Nursing Facility                                       Social Determinants of Health (SDOH) Interventions    Readmission Risk Interventions No flowsheet data found.

## 2021-02-17 NOTE — Progress Notes (Signed)
[facility])  ACC following peripherally, there is not a bed to offer at [facility] today.  Appears that son in more interested in [facility] and has not fully decided.  Once we have a bed to open, ACC will reach out to hospital and family to see their preference.  Wallis Bamberg RN, BSN, CCRN [facility] Liaison

## 2021-02-17 NOTE — Progress Notes (Signed)
PROGRESS NOTE   Brooke Gross  NOM:[id number] DOB: [birth year] DOA: 02/13/2021 PCP: Galvin Proffer, MD   Chief Complaint  Patient presents with   Aspiration   Level of care: Med-Surg  Brief Admission History:  80 y.o. 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  Attempts were made to contact the facility without any answer.  History is obtained from review of the medical record and speaking with the patient's son.  Son states that he has not been notified of any recent shortness of breath, fever, chills, chest pain.  Apparently, the patient had an aspirational type event while eating breakfast.  She was noted to be hypoxic.  EMS was activated.  In addition, EMS also noted the patient to have another aspirational event in route to the hospital.   Apparently, the patient was very somnolent and minimally responsive at the time of arrival.  Since arrival from her nursing facility, has become more alert in the emergency department while receiving antibiotics.  At the time of my evaluation, the patient is able to answer yes/no questions which is her baseline.  She is able to stand but not able to walk at baseline.  In the emergency department, the patient had low-grade temperature 99.3 F.  She was hemodynamically stable but tachypneic.  Oxygen saturation was 98% on 2L.  Patient was started on ceftriaxone and azithromycin in the emergency department.    Assessment & Plan:   Active Problems:   Acute respiratory failure with hypoxia (HCC)   Seizures (HCC)   Aspiration pneumonitis (HCC)   Acute metabolic encephalopathy   Respiratory distress   Hypoxia  Acute encephalopathy -this is likely multifactorial finding given metabolic abnormalities  and respiratory failure.  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  She has been started on new medication and clinically showing some improvement.  Now that patient is no longer taking pills and no longer eating and drinking and pocketing food and pills in the mouth I had a long discussion with her son and he understands now that she likely is not going to have a meaningful improvement or recovery if she is not able to take her medications.  He does not want a feeding tube placed and says that his mother would not want this.  After consideration we gave him time and he notified the social worker after counseling that he would like to proceed with full comfort measures and hospice home placement.  He is interested in [facility].  If patient continues to refuse to eat and drink then I anticipate prognosis less than 2 weeks.  Full comfort measures ordered.  Focus of care is on comfort and dignity at this time.  Son is in full agreement and we have initiated the orders.  Epilepsy-appreciate neurology consultation and recommendations; patient has now transitioned to full comfort measures.  Type 2 diabetes mellitus-continue SSI coverage and CBG monitoring. Comfort measures  Essential hypertension-full comfort measures.   Aspiration pneumonia-appreciate SLP evaluation and dysphagia diet recommendations.  Patient has now transitioned to full comfort measures.  Anxiety and depression-lorazepam ordered as part of full comfort care plan  DVT prophylaxis: full comfort care Code Status: DNR  Family Communication: son updated on 9/1, 9/2, 9/3 Disposition: family hopeful to transfer to hospice Status is: Inpatient  Remains inpatient appropriate because:IV treatments appropriate  due to intensity of illness or inability to take PO and Inpatient level of care appropriate due to severity of illness  Dispo: The patient is from: SNF              Anticipated d/c is to: SNF              Patient  currently is not medically stable to d/c.   Difficult to place patient No  Consultants:  Palliative care   Procedures:    Antimicrobials:    Subjective Unfortunately patient is not taking her pills anymore and not eating or drinking, She is pocketing pills and food in mouth and not swallowing.     Objective: Vitals:   02/16/21 0735 02/16/21 1355 02/16/21 2059 02/17/21 1340  BP:  102/61 (!) 161/77 (!) 170/72  Pulse:  66 60 (!) 57  Resp:  17    Temp:  98.4 F (36.9 C)  98.3 F (36.8 C)  TempSrc:    Oral  SpO2: 98% 98% 94% 97%  Weight:      Height:        Intake/Output Summary (Last 24 hours) at 02/17/2021 1505 Last data filed at 02/17/2021 1300 Gross per 24 hour  Intake 217.92 ml  Output 2700 ml  Net -2482.08 ml   Filed Weights   02/13/21 0714  Weight: 81.6 kg   Examination:  General exam: Pt somnolent, not arousing, Appears calm and comfortable  Respiratory system: Clear to auscultation. Respiratory effort normal. Cardiovascular system: normal S1 & S2 heard. No JVD, murmurs, rubs, gallops or clicks. No pedal edema. Gastrointestinal system: Abdomen is nondistended, soft and nontender. No organomegaly or masses felt. Normal bowel sounds heard. Central nervous system: somnolent. No focal neurological deficits. Extremities: Symmetric 5 x 5 power. Skin: No gross lesions seen.  Psychiatry: Judgement and insight UTD.   Data Reviewed: I have personally reviewed following labs and imaging studies  CBC: Recent Labs  Lab 02/13/21 0727 02/14/21 0440 02/15/21 0526  WBC 10.6* 13.2* 10.9*  NEUTROABS 8.4*  --  8.5*  HGB 10.8* 10.0* 9.5*  HCT 33.5* 30.7* 29.9*  MCV 94.9 94.8 95.8  PLT 284 278 271    Basic Metabolic Panel: Recent Labs  Lab 02/13/21 0727 02/14/21 0440 02/15/21 0526  NA 139 143 143  K 3.6 3.6 3.4*  CL 109 108 108  CO2 25 27 28   GLUCOSE 178* 146* 153*  BUN 13 9 8   CREATININE 0.86 0.82 0.71  CALCIUM 8.7* 8.6* 8.5*  MG  --  1.3* 2.0     GFR: Estimated Creatinine Clearance: 62.4 mL/min (by C-G formula based on SCr of 0.71 mg/dL).  Liver Function Tests: Recent Labs  Lab 02/13/21 0727 02/15/21 0526  AST 16 16  ALT 12 5  ALKPHOS 74 65  BILITOT 0.3 0.2*  PROT 6.5 5.8*  ALBUMIN 2.8* 2.5*    CBG: Recent Labs  Lab 02/13/21 0726  GLUCAP 151*    Recent Results (from the past 240 hour(s))  Resp Panel by RT-PCR (Flu A&B, Covid) Nasopharyngeal Swab     Status: None   Collection Time: 02/13/21 10:20 AM   Specimen: Nasopharyngeal Swab; Nasopharyngeal(NP) swabs in vial transport medium  Result Value Ref Range Status   SARS Coronavirus 2 by RT PCR NEGATIVE NEGATIVE Final    Comment: (NOTE) SARS-CoV-2 target nucleic acids are NOT DETECTED.  The SARS-CoV-2 RNA is generally detectable in upper respiratory specimens during the acute phase of infection. The lowest concentration of SARS-CoV-2 viral copies  this assay can detect is 138 copies/mL. A negative result does not preclude SARS-Cov-2 infection and should not be used as the sole basis for treatment or other patient management decisions. A negative result may occur with  improper specimen collection/handling, submission of specimen other than nasopharyngeal swab, presence of viral mutation(s) within the areas targeted by this assay, and inadequate number of viral copies(<138 copies/mL). A negative result must be combined with clinical observations, patient history, and epidemiological information. The expected result is Negative.  Fact Sheet for Patients:  BloggerCourse.com  Fact Sheet for Healthcare Providers:  SeriousBroker.it  This test is no t yet approved or cleared by the [place] FDA and  has been authorized for detection and/or diagnosis of SARS-CoV-2 by FDA under an Emergency Use Authorization (EUA). This EUA will remain  in effect (meaning this test can be used) for the duration of  the COVID-19 declaration under Section 564(b)(1) of the Act, 21 U.S.C.section 360bbb-3(b)(1), unless the authorization is terminated  or revoked sooner.       Influenza A by PCR NEGATIVE NEGATIVE Final   Influenza B by PCR NEGATIVE NEGATIVE Final    Comment: (NOTE) The Xpert Xpress SARS-CoV-2/FLU/RSV plus assay is intended as an aid in the diagnosis of influenza from Nasopharyngeal swab specimens and should not be used as a sole basis for treatment. Nasal washings and aspirates are unacceptable for Xpert Xpress SARS-CoV-2/FLU/RSV testing.  Fact Sheet for Patients: BloggerCourse.com  Fact Sheet for Healthcare Providers: SeriousBroker.it  This test is not yet approved or cleared by the [place] FDA and has been authorized for detection and/or diagnosis of SARS-CoV-2 by FDA under an Emergency Use Authorization (EUA). This EUA will remain in effect (meaning this test can be used) for the duration of the COVID-19 declaration under Section 564(b)(1) of the Act, 21 U.S.C. section 360bbb-3(b)(1), unless the authorization is terminated or revoked.  Performed at [facility], [address] [place], [place] [address]   Urine Culture     Status: None   Collection Time: 02/14/21 11:33 AM   Specimen: Urine, Clean Catch  Result Value Ref Range Status   Specimen Description   Final    URINE, CLEAN CATCH Performed at [facility], [address], [place], [place] [address]    Special Requests   Final    NONE Performed at [facility], [address] [place], [place] [address]    Culture   Final    NO GROWTH Performed at [facility], [address], [place], [place] [address]    Report Status 02-19-2021 FINAL  Final     Radiology Studies: EEG adult  Result Date: 02/19/2021 Charlsie Quest, MD     2021-02-19  5:41 PM Patient Name: Tobey Lippard MRN: [id number] Epilepsy Attending: Charlsie Quest Referring  Physician/Provider: Dr Standley Dakins Date: 2021/02/19 Duration: 25.14 mins Patient history: 80 year old female with history of epilepsy presented with altered mental status.  EEG to evaluate for seizures. Level of alertness: Awake AEDs during EEG study: Keppra, lacosamide, lamotrigine Technical aspects: This EEG study was done with scalp electrodes positioned according to the 10-20 International system of electrode placement. Electrical activity was acquired at a sampling rate of 500Hz  and reviewed with a high frequency filter of 70Hz  and a low frequency filter of 1Hz . EEG data were recorded continuously and digitally stored. Description: No clear posterior dominant rhythm was seen. EEG showed continuous generalized 5 to 6 Hz theta slowing. Hyperventilation and photic stimulation were not  performed.   Of note, study was technically difficult due to significant eye flutter artifact. ABNORMALITY - Continuous slow, generalized IMPRESSION: This technically difficult study is suggestive of mild to moderate diffuse encephalopathy, nonspecific etiology. No seizures or epileptiform discharges were seen throughout the recording. If concern for ictal-interictal activity persists, please consider repeat study. Priyanka Annabelle Harman    Scheduled Meds:   Continuous Infusions:   LOS: 4 days   Time spent: 35 mins   Suzanna Zahn Laural Benes, MD How to contact the [facility] Attending or Consulting provider 7A - 7P or covering provider during after hours 7P -7A, for this patient?  Check the care team in [facility] and look for a) attending/consulting TRH provider listed and b) the [facility] team listed Log into [email or web address] and use [place]'s universal password to access. If you do not have the password, please contact the hospital operator. Locate the [facility] provider you are looking for under [facility] and page to a number that you can be directly reached. If you still have difficulty reaching the provider, please page the [facility] (Director  on Call) for the Hospitalists listed on amion for assistance.  02/17/2021, 3:05 PM

## 2021-02-18 NOTE — Progress Notes (Signed)
PROGRESS NOTE   Brooke Gross  XBJ:[id number] DOB: 07/21/1940 DOA: 02/13/2021 PCP: Galvin Proffer, MD   Chief Complaint  Patient presents with   Aspiration   Level of care: Med-Surg  Brief Admission History:  80 y.o. 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  Attempts were made to contact the facility without any answer.  History is obtained from review of the medical record and speaking with the patient's son.  Son states that he has not been notified of any recent shortness of breath, fever, chills, chest pain.  Apparently, the patient had an aspirational type event while eating breakfast.  She was noted to be hypoxic.  EMS was activated.  In addition, EMS also noted the patient to have another aspirational event in route to the hospital.   Apparently, the patient was very somnolent and minimally responsive at the time of arrival.  Since arrival from her nursing facility, has become more alert in the emergency department while receiving antibiotics.  At the time of my evaluation, the patient is able to answer yes/no questions which is her baseline.  She is able to stand but not able to walk at baseline.  In the emergency department, the patient had low-grade temperature 99.3 F.  She was hemodynamically stable but tachypneic.  Oxygen saturation was 98% on 2L.  Patient was started on ceftriaxone and azithromycin in the emergency department.    Assessment & Plan:   Active Problems:   Acute respiratory failure with hypoxia (HCC)   Seizures (HCC)   Aspiration pneumonitis (HCC)   Acute metabolic encephalopathy   Respiratory distress   Hypoxia  Acute encephalopathy -this is likely multifactorial finding given metabolic abnormalities  and respiratory failure.  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  She has been started on new medication and clinically showing some improvement.  Now that patient is no longer taking pills and no longer eating and drinking and pocketing food and pills in the mouth I had a long discussion with her son and he understands now that she likely is not going to have a meaningful improvement or recovery if she is not able to take her medications.  He does not want a feeding tube placed and says that his mother would not want this.  After consideration we gave him time and he notified the social worker after counseling that he would like to proceed with full comfort measures and hospice home placement.  He is interested in [facility].  If patient continues to refuse to eat and drink then I anticipate prognosis less than 2 weeks.  Full comfort measures ordered.  Focus of care is on comfort and dignity at this time.  Son is in full agreement and we have initiated the orders.  Epilepsy-appreciate neurology consultation and recommendations; patient has now transitioned to full comfort measures.  Type 2 diabetes mellitus-continue SSI coverage and CBG monitoring. Comfort measures, no cbg testing  Essential hypertension-full comfort measures.   Aspiration pneumonia-appreciate SLP evaluation and dysphagia diet recommendations.  Patient has now transitioned to full comfort measures.  Anxiety and depression-lorazepam ordered as part of full comfort care plan  DVT prophylaxis: full comfort care Code Status: DNR  Family Communication: son updated on 9/1, 9/2, 9/3 Disposition: family hopeful to transfer to hospice Status is: Inpatient  Remains inpatient appropriate  because:IV treatments appropriate due to intensity of illness or inability to take PO and Inpatient level of care appropriate due to severity of illness  Dispo: The patient is from: SNF              Anticipated d/c is to: SNF               Patient currently is not medically stable to d/c.   Difficult to place patient No  Consultants:  Palliative care   Procedures:    Antimicrobials:    Subjective Resting comfortably, nonverbal this morning.    Objective: Vitals:   02/16/21 0735 02/16/21 1355 02/16/21 2059 02/17/21 1340  BP:  102/61 (!) 161/77 (!) 170/72  Pulse:  66 60 (!) 57  Resp:  17    Temp:  98.4 F (36.9 C)  98.3 F (36.8 C)  TempSrc:    Oral  SpO2: 98% 98% 94% 97%  Weight:      Height:        Intake/Output Summary (Last 24 hours) at 02/18/2021 0947 Last data filed at 02/17/2021 2100 Gross per 24 hour  Intake 0 ml  Output 1800 ml  Net -1800 ml   Filed Weights   02/13/21 0714  Weight: 81.6 kg   Examination:  General exam: Pt somnolent but arousable, Appears calm and comfortable  Respiratory system: Clear to auscultation. Respiratory effort normal. Cardiovascular system: normal S1 & S2 heard. No JVD, murmurs, rubs, gallops or clicks. No pedal edema. Gastrointestinal system: Abdomen is nondistended, soft and nontender. No organomegaly or masses felt. Normal bowel sounds heard. Central nervous system: somnolent. No focal neurological deficits. Extremities: trace pretibial edema. Skin: No gross lesions seen.  Psychiatry: Judgement and insight UTD.   Data Reviewed: I have personally reviewed following labs and imaging studies  CBC: Recent Labs  Lab 02/13/21 0727 02/14/21 0440 02/15/21 0526  WBC 10.6* 13.2* 10.9*  NEUTROABS 8.4*  --  8.5*  HGB 10.8* 10.0* 9.5*  HCT 33.5* 30.7* 29.9*  MCV 94.9 94.8 95.8  PLT 284 278 271    Basic Metabolic Panel: Recent Labs  Lab 02/13/21 0727 02/14/21 0440 02/15/21 0526  NA 139 143 143  K 3.6 3.6 3.4*  CL 109 108 108  CO2 25 27 28   GLUCOSE 178* 146* 153*  BUN 13 9 8   CREATININE 0.86 0.82 0.71  CALCIUM 8.7* 8.6* 8.5*  MG  --  1.3* 2.0    GFR: Estimated Creatinine Clearance: 62.4 mL/min (by C-G formula based on SCr of 0.71 mg/dL).  Liver  Function Tests: Recent Labs  Lab 02/13/21 0727 02/15/21 0526  AST 16 16  ALT 12 5  ALKPHOS 74 65  BILITOT 0.3 0.2*  PROT 6.5 5.8*  ALBUMIN 2.8* 2.5*    CBG: Recent Labs  Lab 02/13/21 0726  GLUCAP 151*    Recent Results (from the past 240 hour(s))  Resp Panel by RT-PCR (Flu A&B, Covid) Nasopharyngeal Swab     Status: None   Collection Time: 02/13/21 10:20 AM   Specimen: Nasopharyngeal Swab; Nasopharyngeal(NP) swabs in vial transport medium  Result Value Ref Range Status   SARS Coronavirus 2 by RT PCR NEGATIVE NEGATIVE Final    Comment: (NOTE) SARS-CoV-2 target nucleic acids are NOT DETECTED.  The SARS-CoV-2 RNA is generally detectable in upper respiratory specimens during the acute phase of infection. The lowest concentration of SARS-CoV-2 viral copies this assay can detect is 138 copies/mL. A negative result does not preclude SARS-Cov-2 infection and should not be  used as the sole basis for treatment or other patient management decisions. A negative result may occur with  improper specimen collection/handling, submission of specimen other than nasopharyngeal swab, presence of viral mutation(s) within the areas targeted by this assay, and inadequate number of viral copies(<138 copies/mL). A negative result must be combined with clinical observations, patient history, and epidemiological information. The expected result is Negative.  Fact Sheet for Patients:  BloggerCourse.com  Fact Sheet for Healthcare Providers:  SeriousBroker.it  This test is no t yet approved or cleared by the [place] FDA and  has been authorized for detection and/or diagnosis of SARS-CoV-2 by FDA under an Emergency Use Authorization (EUA). This EUA will remain  in effect (meaning this test can be used) for the duration of the COVID-19 declaration under Section 564(b)(1) of the Act, 21 U.S.C.section 360bbb-3(b)(1), unless the authorization  is terminated  or revoked sooner.       Influenza A by PCR NEGATIVE NEGATIVE Final   Influenza B by PCR NEGATIVE NEGATIVE Final    Comment: (NOTE) The Xpert Xpress SARS-CoV-2/FLU/RSV plus assay is intended as an aid in the diagnosis of influenza from Nasopharyngeal swab specimens and should not be used as a sole basis for treatment. Nasal washings and aspirates are unacceptable for Xpert Xpress SARS-CoV-2/FLU/RSV testing.  Fact Sheet for Patients: BloggerCourse.com  Fact Sheet for Healthcare Providers: SeriousBroker.it  This test is not yet approved or cleared by the [place] FDA and has been authorized for detection and/or diagnosis of SARS-CoV-2 by FDA under an Emergency Use Authorization (EUA). This EUA will remain in effect (meaning this test can be used) for the duration of the COVID-19 declaration under Section 564(b)(1) of the Act, 21 U.S.C. section 360bbb-3(b)(1), unless the authorization is terminated or revoked.  Performed at [facility], [address] [place], [place] [address]   Urine Culture     Status: None   Collection Time: 02/14/21 11:33 AM   Specimen: Urine, Clean Catch  Result Value Ref Range Status   Specimen Description   Final    URINE, CLEAN CATCH Performed at [facility], [address] [place], [place] [address]    Special Requests   Final    NONE Performed at [facility], [address] [place], [place] [address]    Culture   Final    NO GROWTH Performed at [facility], [address] [place], [place] [address]    Report Status February 28, 2021 FINAL  Final     Radiology Studies: EEG adult  Result Date: 02-28-2021 Charlsie Quest, MD     02/28/2021  5:41 PM Patient Name: Brooke Gross MRN: [id number] Epilepsy Attending: Charlsie Quest Referring Physician/Provider: Dr Standley Dakins Date: 02-28-21 Duration: 25.14 mins Patient history: 80 year old female with history of  epilepsy presented with altered mental status.  EEG to evaluate for seizures. Level of alertness: Awake AEDs during EEG study: Keppra, lacosamide, lamotrigine Technical aspects: This EEG study was done with scalp electrodes positioned according to the 10-20 International system of electrode placement. Electrical activity was acquired at a sampling rate of 500Hz  and reviewed with a high frequency filter of 70Hz  and a low frequency filter of 1Hz . EEG data were recorded continuously and digitally stored. Description: No clear posterior dominant rhythm was seen. EEG showed continuous generalized 5 to 6 Hz theta slowing. Hyperventilation and photic stimulation were not performed.   Of note, study was technically difficult due to significant eye flutter artifact. ABNORMALITY - Continuous slow,  generalized IMPRESSION: This technically difficult study is suggestive of mild to moderate diffuse encephalopathy, nonspecific etiology. No seizures or epileptiform discharges were seen throughout the recording. If concern for ictal-interictal activity persists, please consider repeat study. Priyanka Annabelle Harman    Scheduled Meds:   Continuous Infusions:   LOS: 5 days   Time spent: 35 mins   Macallan Ord Laural Benes, MD How to contact the [facility] Attending or Consulting provider 7A - 7P or covering provider during after hours 7P -7A, for this patient?  Check the care team in [facility] and look for a) attending/consulting TRH provider listed and b) the [facility] team listed Log into [email or web address] and use [place]'s universal password to access. If you do not have the password, please contact the hospital operator. Locate the [facility] provider you are looking for under [facility] and page to a number that you can be directly reached. If you still have difficulty reaching the provider, please page the [facility] (Director on Call) for the Hospitalists listed on amion for assistance.  02/18/2021, 9:47 AM

## 2021-02-18 NOTE — TOC Progression Note (Signed)
Transition of Care [facility]) - Progression Note    Patient Details  Name: Brooke Gross MRN: [id number] Date of Birth: Nov 30, 1940  Transition of Care [facility]) CM/SW Contact  Barry Brunner, LCSW Phone Number: 02/18/2021, 11:58 AM  Clinical Narrative:    CSW notified of patient's request for [facility] hospice if bed available. CSW contacted [facility] hosp[facility]. [facility] reported no bed availability and stated that the earliest possible day to admit would be Wednessday. They also reported that they were unsure of the amount of patients on the waitlist. CSW faxed referral to [facility] hospice. [facility] agreeable to review referral. CSW contacted [facility]. [facility] reported that the earliest they could take the patient was Tuesday or Wednesday but was also unsure of the number of patient's on their wait list. TOC to follow.    Expected Discharge Plan: Skilled Nursing Facility Barriers to Discharge: Continued Medical Work up  Expected Discharge Plan and Services Expected Discharge Plan: Skilled Nursing Facility     Post Acute Care Choice:  (Palliative) Living arrangements for the past 2 months: Skilled Nursing Facility                                       Social Determinants of Health (SDOH) Interventions    Readmission Risk Interventions No flowsheet data found.

## 2021-02-18 NOTE — Progress Notes (Signed)
[facility])  [name] is approved for residential hospice at [facility], however we do not have an open bed today.  Once we have an open bed, [facility] will update hospital staff and family.  Thank you, Brooke Bamberg RN, BSN, CCRN [facility] Liaison

## 2021-02-19 NOTE — Progress Notes (Signed)
[facility] ([facility]) Hospital Liaison note.   ? ?[facility] is unable to offer a room today. Hospital Liaison will follow up tomorrow or sooner if a room becomes available. Eligibility is confirmed.  ? ?Please do not hesitate to call with questions.   ? ?Thank you,   ?Mary Anne Robertson, RN, CCM      ?[facility] Hospital Liaison   ?[phone] ?

## 2021-02-19 NOTE — Progress Notes (Signed)
PROGRESS NOTE   Brooke Gross  IRC:[id number] DOB: November 24, 1940 DOA: 02/13/2021 PCP: Galvin Proffer, MD   Chief Complaint  Patient presents with   Aspiration   Level of care: Med-Surg  Brief Admission History:  80 y.o. 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  Attempts were made to contact the facility without any answer.  History is obtained from review of the medical record and speaking with the patient's son.  Son states that he has not been notified of any recent shortness of breath, fever, chills, chest pain.  Apparently, the patient had an aspirational type event while eating breakfast.  She was noted to be hypoxic.  EMS was activated.  In addition, EMS also noted the patient to have another aspirational event in route to the hospital.   Apparently, the patient was very somnolent and minimally responsive at the time of arrival.  Since arrival from her nursing facility, has become more alert in the emergency department while receiving antibiotics.  At the time of my evaluation, the patient is able to answer yes/no questions which is her baseline.  She is able to stand but not able to walk at baseline.  In the emergency department, the patient had low-grade temperature 99.3 F.  She was hemodynamically stable but tachypneic.  Oxygen saturation was 98% on 2L.  Patient was started on ceftriaxone and azithromycin in the emergency department.    Assessment & Plan:   Active Problems:   Acute respiratory failure with hypoxia (HCC)   Seizures (HCC)   Aspiration pneumonitis (HCC)   Acute metabolic encephalopathy   Respiratory distress   Hypoxia  Acute encephalopathy -this is likely multifactorial finding given metabolic abnormalities  and respiratory failure.  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  She has been started on new medication and clinically showing some improvement.  Now that patient is no longer taking pills and no longer eating and drinking and pocketing food and pills in the mouth I had a long discussion with her son and he understands now that she likely is not going to have a meaningful improvement or recovery if she is not able to take her medications.  He does not want a feeding tube placed and says that his mother would not want this.  After consideration we gave him time and he notified the social worker after counseling that he would like to proceed with full comfort measures and hospice home placement.  He is interested in [facility] [employer].  If patient continues to refuse to eat and drink then I anticipate prognosis less than 2 weeks.  Full comfort measures ordered.  Focus of care is on comfort and dignity at this time.  Son is in full agreement and we have initiated the orders.  Epilepsy-appreciate neurology consultation and recommendations; patient has now transitioned to full comfort measures.  Pt had as seizure yesterday and being treated with lorazepam.  She is unable to take oral meds anymore.    Type 2 diabetes mellitus-Comfort measures, no cbg testing  Essential hypertension-full comfort measures.   Aspiration pneumonia-appreciate SLP evaluation and dysphagia diet recommendations.  Patient has now transitioned to full comfort measures.  Anxiety and depression-lorazepam ordered as part of full comfort care plan  DVT prophylaxis: full comfort care Code Status: DNR  Family Communication: son updated on 9/1,  9/2, 9/3 Disposition: family hopeful to transfer to hospice if bed becomes available  Status is: Inpatient  Remains inpatient appropriate because:IV treatments appropriate due to intensity of illness or inability to take PO and Inpatient level of care appropriate due to severity of  illness  Dispo: The patient is from: SNF              Anticipated d/c is to: SNF              Patient currently is not medically stable to d/c.   Difficult to place patient No  Consultants:  Palliative care   Procedures:    Antimicrobials:    Subjective Resting comfortably.    Objective: Vitals:   02/18/21 1336 02/18/21 1745 02/19/21 0800 02/19/21 1000  BP: (!) 150/72 115/85    Pulse: 72     Resp: 16     Temp: 98.2 F (36.8 C)  100 F (37.8 C) 98.9 F (37.2 C)  TempSrc: Oral  Axillary Axillary  SpO2: 100% 100%    Weight:      Height:        Intake/Output Summary (Last 24 hours) at 02/19/2021 1152 Last data filed at 02/19/2021 0900 Gross per 24 hour  Intake 240 ml  Output --  Net 240 ml   Filed Weights   02/13/21 0714  Weight: 81.6 kg   Examination:  General exam: Pt somnolent, Appears calm and comfortable  Respiratory system: Clear to auscultation. Respiratory effort normal. Cardiovascular system: normal S1 & S2 heard. No JVD, murmurs, rubs, gallops or clicks. No pedal edema. Gastrointestinal system: Abdomen is nondistended, soft and nontender. No organomegaly or masses felt. Normal bowel sounds heard. Central nervous system: somnolent. No focal neurological deficits. Extremities: trace pretibial edema. Skin: No gross lesions seen.  Psychiatry: Judgement and insight UTD.   Data Reviewed: I have personally reviewed following labs and imaging studies  CBC: Recent Labs  Lab 02/13/21 0727 02/14/21 0440 02/15/21 0526  WBC 10.6* 13.2* 10.9*  NEUTROABS 8.4*  --  8.5*  HGB 10.8* 10.0* 9.5*  HCT 33.5* 30.7* 29.9*  MCV 94.9 94.8 95.8  PLT 284 278 271    Basic Metabolic Panel: Recent Labs  Lab 02/13/21 0727 02/14/21 0440 02/15/21 0526  NA 139 143 143  K 3.6 3.6 3.4*  CL 109 108 108  CO2 25 27 28   GLUCOSE 178* 146* 153*  BUN 13 9 8   CREATININE 0.86 0.82 0.71  CALCIUM 8.7* 8.6* 8.5*  MG  --  1.3* 2.0    GFR: Estimated Creatinine Clearance: 62.4  mL/min (by C-G formula based on SCr of 0.71 mg/dL).  Liver Function Tests: Recent Labs  Lab 02/13/21 0727 02/15/21 0526  AST 16 16  ALT 12 5  ALKPHOS 74 65  BILITOT 0.3 0.2*  PROT 6.5 5.8*  ALBUMIN 2.8* 2.5*    CBG: Recent Labs  Lab 02/13/21 0726  GLUCAP 151*    Recent Results (from the past 240 hour(s))  Resp Panel by RT-PCR (Flu A&B, Covid) Nasopharyngeal Swab     Status: None   Collection Time: 02/13/21 10:20 AM   Specimen: Nasopharyngeal Swab; Nasopharyngeal(NP) swabs in vial transport medium  Result Value Ref Range Status   SARS Coronavirus 2 by RT PCR NEGATIVE NEGATIVE Final    Comment: (NOTE) SARS-CoV-2 target nucleic acids are NOT DETECTED.  The SARS-CoV-2 RNA is generally detectable in upper respiratory specimens during the acute phase of infection. The lowest concentration of SARS-CoV-2 viral copies this assay can  detect is 138 copies/mL. A negative result does not preclude SARS-Cov-2 infection and should not be used as the sole basis for treatment or other patient management decisions. A negative result may occur with  improper specimen collection/handling, submission of specimen other than nasopharyngeal swab, presence of viral mutation(s) within the areas targeted by this assay, and inadequate number of viral copies(<138 copies/mL). A negative result must be combined with clinical observations, patient history, and epidemiological information. The expected result is Negative.  Fact Sheet for Patients:  BloggerCourse.com  Fact Sheet for Healthcare Providers:  SeriousBroker.it  This test is no t yet approved or cleared by the [place] FDA and  has been authorized for detection and/or diagnosis of SARS-CoV-2 by FDA under an Emergency Use Authorization (EUA). This EUA will remain  in effect (meaning this test can be used) for the duration of the COVID-19 declaration under Section 564(b)(1) of the Act,  21 U.S.C.section 360bbb-3(b)(1), unless the authorization is terminated  or revoked sooner.       Influenza A by PCR NEGATIVE NEGATIVE Final   Influenza B by PCR NEGATIVE NEGATIVE Final    Comment: (NOTE) The Xpert Xpress SARS-CoV-2/FLU/RSV plus assay is intended as an aid in the diagnosis of influenza from Nasopharyngeal swab specimens and should not be used as a sole basis for treatment. Nasal washings and aspirates are unacceptable for Xpert Xpress SARS-CoV-2/FLU/RSV testing.  Fact Sheet for Patients: BloggerCourse.com  Fact Sheet for Healthcare Providers: SeriousBroker.it  This test is not yet approved or cleared by the [place] FDA and has been authorized for detection and/or diagnosis of SARS-CoV-2 by FDA under an Emergency Use Authorization (EUA). This EUA will remain in effect (meaning this test can be used) for the duration of the COVID-19 declaration under Section 564(b)(1) of the Act, 21 U.S.C. section 360bbb-3(b)(1), unless the authorization is terminated or revoked.  Performed at [facility], [address], [place], [place] [address]   Urine Culture     Status: None   Collection Time: 02/14/21 11:33 AM   Specimen: Urine, Clean Catch  Result Value Ref Range Status   Specimen Description   Final    URINE, CLEAN CATCH Performed at [facility], [address] [place], [place] [address]    Special Requests   Final    NONE Performed at [facility], [address], [place], [place] [address]    Culture   Final    NO GROWTH Performed at [facility], [address], [place], [place] [address]    Report Status 02/16/2021 FINAL  Final     Radiology Studies: No results found.  Scheduled Meds:   Continuous Infusions:   LOS: 6 days   Time spent: 35 mins   Greer Wainright Laural Benes, MD How to contact the [facility] Attending or Consulting provider 7A - 7P or covering provider during after hours 7P -7A, for  this patient?  Check the care team in [facility] and look for a) attending/consulting TRH provider listed and b) the [facility] team listed Log into [email or web address] and use [place]'s universal password to access. If you do not have the password, please contact the hospital operator. Locate the [facility] provider you are looking for under [facility] and page to a number that you can be directly reached. If you still have difficulty reaching the provider, please page the [facility] (Director on Call) for the Hospitalists listed on amion for assistance.  02/19/2021, 11:52 AM

## 2021-02-19 NOTE — Progress Notes (Signed)
Entered room to find Patient having seizure like activity. Patient trembling with eyes fixed to the ceiling. Ativan 1mg  given IV as ordered. Oral suction and mouth care given.

## 2021-02-19 NOTE — Progress Notes (Signed)
Received [place] consult for EOL spiritual support. Found patient non responsive inhospital room this morning with no family present bedside. Provided quiet prayer and support. Called daughter and was unable to reach by phone but did leave a VM message. Patient is awaiting transfer to Hospice home. Will continue to follow and remain available in order to provide spiritual support and to assess for spiritual need.   Rev. Ronny Ruddell [employer]

## 2021-02-20 MED ORDER — LORAZEPAM 2 MG/ML PO CONC
1.0000 mg | Freq: Four times a day (QID) | ORAL | Status: DC
  Filled 2021-02-20: qty 0.5

## 2021-02-20 MED ORDER — LORAZEPAM 2 MG/ML IJ SOLN
1.0000 mg | Freq: Four times a day (QID) | INTRAMUSCULAR | Status: DC
  Administered 2021-02-20 – 2021-02-21 (×4): 1 mg via INTRAVENOUS
  Filled 2021-02-20 (×4): qty 1

## 2021-02-20 MED ORDER — LORAZEPAM 1 MG PO TABS: 1.0000 mg | ORAL_TABLET | Freq: Four times a day (QID) | ORAL | Status: DC

## 2021-02-20 NOTE — Progress Notes (Signed)
Palliative:  HPI: 80 y.o. 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 With continued decline over course of hospitalization and family have elected comfort care measures.   Ms. Hubers is resting in bed and appears comfortable. She has shallow but regular breathing. No signs of mottling and warm to touch. She makes attempts to turn head and open eyes without full success when I call her name. No family/visitors at bedside. She appears comfortable. Discussed with Dr. Laural Benes who expresses concern for seizures and recommend scheduled sublingual Ativan for seizure prevention and comfort. Awaiting hospice placement. She appears stable for transport when bed available.   Exam: Minimally responsive although no movement other than slight turn of head to voice. Breathing shallow but regular. Abd soft, flat. Warm to touch. No mottling.   Plan: - Full comfort care.  - Awaiting hospice bed.  - Scheduled sublingual Ativan for seizure control/prevention.   15 min  Yong Channel, NP Palliative Medicine Team Pager [phone] (Please see [email or web address] for schedule) Team Phone [phone]

## 2021-02-20 NOTE — Progress Notes (Signed)
PROGRESS NOTE   Brooke Gross  NAT:[id number] DOB: July 16, 1940 DOA: 02/13/2021 PCP: Galvin Proffer, MD   Chief Complaint  Patient presents with   Aspiration   Level of care: Med-Surg  Brief Admission History:  80 y.o. female with medical history of epilepsy, cognitive impairment resulting from her epilepsy, bipolar disorder, diabetes mellitus type 2, dysphagia, hypertension, hyperlipidemia, stroke, and coronary artery disease presenting from [facility] secondary to shortness of breath and hypoxia.  Attempts were made to contact the facility without any answer.  History is obtained from review of the medical record and speaking with the patient's son.  Son states that he has not been notified of any recent shortness of breath, fever, chills, chest pain.  Apparently, the patient had an aspirational type event while eating breakfast.  She was noted to be hypoxic.  EMS was activated.  In addition, EMS also noted the patient to have another aspirational event in route to the hospital.   Apparently, the patient was very somnolent and minimally responsive at the time of arrival.  Since arrival from her nursing facility, has become more alert in the emergency department while receiving antibiotics.  At the time of my evaluation, the patient is able to answer yes/no questions which is her baseline.  She is able to stand but not able to walk at baseline.  In the emergency department, the patient had low-grade temperature 99.3 F.  She was hemodynamically stable but tachypneic.  Oxygen saturation was 98% on 2L.  Patient was started on ceftriaxone and azithromycin in the emergency department.    Assessment & Plan:   Active Problems:   Acute respiratory failure with hypoxia (HCC)   Seizures (HCC)   Aspiration pneumonitis (HCC)   Acute metabolic encephalopathy   Respiratory distress   Hypoxia  Acute encephalopathy -this is likely multifactorial finding given metabolic abnormalities  and respiratory failure.  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  She had been started on Sinemet but not much improvement and now that patient is no longer taking pills and no longer eating and drinking and pocketing food and pills in the mouth.  I had a long discussion with her son and he understands now that she likely is not going to have a meaningful improvement or recovery if she is not able to take her medications.  He does NOT want a feeding tube placed and says that his mother would not want this.  After consideration we gave him time and he notified myself and the social worker that he would like to proceed with full comfort measures and residential hospice home placement.  He is interested in [employer] services at the [facility] [place] location.  If patient continues to refuse to eat and drink then I anticipate prognosis less than 2 weeks.  Full comfort measures ordered.  Focus of care is on comfort and dignity at this time.  Son is in full agreement and we have initiated the orders. Son resides in [place].    Epilepsy-appreciate neurology consultation and recommendations; patient has now transitioned to full comfort measures.  Pt had as seizure 2 days ago and being treated with lorazepam.  I have ordered to schedule the lorazepam every 6 hours to try to avoid another seizure.    Type 2 diabetes mellitus-Comfort measures, no cbg testing  Essential hypertension-full comfort measures.   Aspiration pneumonia-appreciate SLP evaluation and dysphagia diet recommendations.  Patient has now transitioned to full comfort measures.  Anxiety and depression-lorazepam ordered as part  of full comfort care plan  DVT prophylaxis: full comfort care Code Status: DNR  Family Communication: son updated on 9/1, 9/2, 9/3, 9/5 Disposition: family hopeful to transfer to hospice if bed becomes available  Status is: Inpatient  Remains inpatient appropriate because:IV treatments appropriate due to intensity of  illness or inability to take PO and Inpatient level of care appropriate due to severity of illness  Dispo: The patient is from: SNF              Anticipated d/c is to: residential hospice               Patient currently is medically stable to d/c.   Difficult to place patient No  Consultants:  Palliative care   Procedures:    Antimicrobials:    Subjective Resting comfortably.  Pt opens eyes today and smiled when I spoke of her son.   Objective: Vitals:   02/19/21 1000 02/19/21 1251 02/19/21 2056 02/20/21 1415  BP:  140/64 (!) 109/57 (!) 157/91  Pulse:  76 67 74  Resp:  17 16 18   Temp: 98.9 F (37.2 C) 100.3 F (37.9 C) 98.8 F (37.1 C) (!) 97.5 F (36.4 C)  TempSrc: Axillary  Oral Oral  SpO2:  97% 95% 97%  Weight:      Height:        Intake/Output Summary (Last 24 hours) at 02/20/2021 1539 Last data filed at 02/20/2021 1515 Gross per 24 hour  Intake 100 ml  Output 600 ml  Net -500 ml   Filed Weights   02/13/21 0714  Weight: 81.6 kg   Examination:  General exam: Pt somnolent, Appears calm and comfortable  Respiratory system: Clear to auscultation. Respiratory effort normal. Cardiovascular system: normal S1 & S2 heard. No JVD, murmurs, rubs, gallops or clicks. No pedal edema. Gastrointestinal system: Abdomen is nondistended, soft and nontender. No organomegaly or masses felt. Normal bowel sounds heard. Central nervous system: somnolent. No focal neurological deficits. Extremities: trace pretibial edema. Skin: No gross lesions seen.  Psychiatry: Judgement and insight UTD.   Data Reviewed: I have personally reviewed following labs and imaging studies  CBC: Recent Labs  Lab 02/14/21 0440 02/15/21 0526  WBC 13.2* 10.9*  NEUTROABS  --  8.5*  HGB 10.0* 9.5*  HCT 30.7* 29.9*  MCV 94.8 95.8  PLT 278 271    Basic Metabolic Panel: Recent Labs  Lab 02/14/21 0440 02/15/21 0526  NA 143 143  K 3.6 3.4*  CL 108 108  CO2 27 28  GLUCOSE 146* 153*  BUN 9 8   CREATININE 0.82 0.71  CALCIUM 8.6* 8.5*  MG 1.3* 2.0    GFR: Estimated Creatinine Clearance: 62.4 mL/min (by C-G formula based on SCr of 0.71 mg/dL).  Liver Function Tests: Recent Labs  Lab 02/15/21 0526  AST 16  ALT 5  ALKPHOS 65  BILITOT 0.2*  PROT 5.8*  ALBUMIN 2.5*    CBG: No results for input(s): GLUCAP in the last 168 hours.   Recent Results (from the past 240 hour(s))  Resp Panel by RT-PCR (Flu A&B, Covid) Nasopharyngeal Swab     Status: None   Collection Time: 02/13/21 10:20 AM   Specimen: Nasopharyngeal Swab; Nasopharyngeal(NP) swabs in vial transport medium  Result Value Ref Range Status   SARS Coronavirus 2 by RT PCR NEGATIVE NEGATIVE Final    Comment: (NOTE) SARS-CoV-2 target nucleic acids are NOT DETECTED.  The SARS-CoV-2 RNA is generally detectable in upper respiratory specimens during the acute phase  of infection. The lowest concentration of SARS-CoV-2 viral copies this assay can detect is 138 copies/mL. A negative result does not preclude SARS-Cov-2 infection and should not be used as the sole basis for treatment or other patient management decisions. A negative result may occur with  improper specimen collection/handling, submission of specimen other than nasopharyngeal swab, presence of viral mutation(s) within the areas targeted by this assay, and inadequate number of viral copies(<138 copies/mL). A negative result must be combined with clinical observations, patient history, and epidemiological information. The expected result is Negative.  Fact Sheet for Patients:  BloggerCourse.com  Fact Sheet for Healthcare Providers:  SeriousBroker.it  This test is no t yet approved or cleared by the [place] FDA and  has been authorized for detection and/or diagnosis of SARS-CoV-2 by FDA under an Emergency Use Authorization (EUA). This EUA will remain  in effect (meaning this test can be used) for  the duration of the COVID-19 declaration under Section 564(b)(1) of the Act, 21 U.S.C.section 360bbb-3(b)(1), unless the authorization is terminated  or revoked sooner.       Influenza A by PCR NEGATIVE NEGATIVE Final   Influenza B by PCR NEGATIVE NEGATIVE Final    Comment: (NOTE) The Xpert Xpress SARS-CoV-2/FLU/RSV plus assay is intended as an aid in the diagnosis of influenza from Nasopharyngeal swab specimens and should not be used as a sole basis for treatment. Nasal washings and aspirates are unacceptable for Xpert Xpress SARS-CoV-2/FLU/RSV testing.  Fact Sheet for Patients: BloggerCourse.com  Fact Sheet for Healthcare Providers: SeriousBroker.it  This test is not yet approved or cleared by the [place] FDA and has been authorized for detection and/or diagnosis of SARS-CoV-2 by FDA under an Emergency Use Authorization (EUA). This EUA will remain in effect (meaning this test can be used) for the duration of the COVID-19 declaration under Section 564(b)(1) of the Act, 21 U.S.C. section 360bbb-3(b)(1), unless the authorization is terminated or revoked.  Performed at [facility], [address], [place], [place] [address]   Urine Culture     Status: None   Collection Time: 02/14/21 11:33 AM   Specimen: Urine, Clean Catch  Result Value Ref Range Status   Specimen Description   Final    URINE, CLEAN CATCH Performed at [facility], [address] [place], [place] [address]    Special Requests   Final    NONE Performed at [facility], [address] [place], [place] [address]    Culture   Final    NO GROWTH Performed at [facility], [address], [place], [place] [address]    Report Status 02/16/2021 FINAL  Final     Radiology Studies: No results found.  Scheduled Meds:  LORazepam  1 mg Sublingual Q6H   Or   LORazepam  1 mg Oral Q6H   Or   LORazepam  1 mg Intravenous Q6H   Continuous  Infusions:   LOS: 7 days   Time spent: 35 mins   [place] Laural Benes, MD How to contact the [facility] Attending or Consulting provider 7A - 7P or covering provider during after hours 7P -7A, for this patient?  Check the care team in [facility] and look for a) attending/consulting TRH provider listed and b) the [facility] team listed Log into [email or web address] and use [place]'s universal password to access. If you do not have the password, please contact the hospital operator. Locate the [facility] provider you are looking for under [facility] and page to a number that  you can be directly reached. If you still have difficulty reaching the provider, please page the [facility] (Director on Call) for the Hospitalists listed on amion for assistance.  02/20/2021, 3:39 PM

## 2021-02-20 NOTE — Progress Notes (Signed)
[facility] ([facility]) Hospital Liaison note.    [facility] is unable to offer a room today. Hospital Liaison will follow up tomorrow or sooner if a room becomes available. Please do not hesitate to call with questions.    Thank you for the opportunity to participate in this patient's care.  Chrislyn King, BSN, RN [facility] Hospital Liaison (listed on AMION under Hospice/[facility])    [phone] (24h on call)     

## 2021-02-21 DIAGNOSIS — R0902 Hypoxemia: Secondary | ICD-10-CM

## 2021-02-21 MED ORDER — MORPHINE SULFATE (CONCENTRATE) 10 MG/0.5ML PO SOLN: 5.0000 mg | ORAL | 0 refills | Status: DC | PRN

## 2021-02-21 MED ORDER — MORPHINE SULFATE (CONCENTRATE) 10 MG/0.5ML PO SOLN: 5.0000 mg | ORAL | 0 refills | Status: AC | PRN

## 2021-02-21 MED ORDER — LORAZEPAM 2 MG/ML PO CONC: 1.0000 mg | Freq: Four times a day (QID) | ORAL | 0 refills | Status: AC

## 2021-02-21 MED ORDER — LORAZEPAM 2 MG/ML PO CONC: 1.0000 mg | Freq: Four times a day (QID) | ORAL | 0 refills | Status: DC

## 2021-02-21 NOTE — TOC Transition Note (Signed)
Transition of Care [facility]) - CM/SW Discharge Note   Patient Details  Name: Brooke Gross MRN: [id number] Date of Birth: 14-Mar-1941  Transition of Care [facility]) CM/SW Contact:  Brooke Needy, LCSW Phone Number: 02/21/2021, 2:31 PM   Clinical Narrative:    Brooke Gross has bed available. [facility] transport arranged, however they may not be able to pick patient up until 9p.m. RN, Brooke Gross at [facility], and son, Brooke Gross, advised of potentially late transport time.    Final next level of care: Hospice Medical Facility Barriers to Discharge: No Barriers Identified   Patient Goals and CMS Choice Patient states their goals for this hospitalization and ongoing recovery are:: residential hospice      Discharge Placement              Patient chooses bed at: Other - please specify in the comment section below: [facility]) Patient to be transferred to facility by: [facility] Name of family member notified: Son, Brooke Gross Patient and family notified of of transfer: 02/21/21  Discharge Plan and Services     Post Acute Care Choice:  (Palliative)                               Social Determinants of Health (SDOH) Interventions     Readmission Risk Interventions No flowsheet data found.

## 2021-02-21 NOTE — Progress Notes (Signed)
[facility] ([facility]) Hospital Liaison note.    Chart reviewed and eligibility confirmed for [facility]. Spoke with family to confirm interest and explain services. Family agreeable to transfer today.  TOC aware.    [facility] will notify TOC when registration paperwork has been completed to arrange transport.   RN please call report to [phone].  Please be sure the signed [place] DNR form transports with the patient.  Thank you for the opportunity to participate in this patient's care.  Chrislyn King, BSN, RN [facility] Hospital Liaison (listed on AMION under [facility]ospice[facility])    [phone] (24h on call)     

## 2021-02-21 NOTE — Discharge Summary (Signed)
Physician Discharge Summary  Brooke Gross ZOX:[id number] DOB: [birth year] DOA: 02/13/2021  PCP: Galvin Proffer, MD  Admit date: 02/13/2021  Discharge date: 02/21/2021  Admitted From:SNF  Disposition:  [facility]  Recommendations for Outpatient Follow-up:  Follow up with hospice facility outpatient Continue on Ativan sublingual as prescribed every 6 hours to prevent seizures Continue morphine sublingual as needed for pain control  Home Health: None  Equipment/Devices: None  Discharge Condition:Stable  CODE STATUS: DNR  Diet recommendation: Dysphagia 2 diet  Brief/Interim Summary: 80 y.o. female with medical history of epilepsy, cognitive impairment resulting from her epilepsy, bipolar disorder, diabetes mellitus type 2, dysphagia, hypertension, hyperlipidemia, stroke, and coronary artery disease presenting from [facility] secondary to shortness of breath and hypoxia.  Attempts were made to contact the facility without any answer.  History is obtained from review of the medical record and speaking with the patient's son.  Son states that he has not been notified of any recent shortness of breath, fever, chills, chest pain.  Apparently, the patient had an aspirational type event while eating breakfast.  She was noted to be hypoxic.  EMS was activated.  In addition, EMS also noted the patient to have another aspirational event in route to the hospital.   Apparently, the patient was very somnolent and minimally responsive at the time of arrival.  Since arrival from her nursing facility, has become more alert in the emergency department while receiving antibiotics.  At the time of my evaluation, the patient is able to answer yes/no questions which is her baseline.  She is able to stand but not able to walk at baseline.  In the emergency department, the patient had low-grade temperature 99.3 F.  She was hemodynamically stable but tachypneic.  Oxygen saturation was  98% on 2L.  Patient was started on ceftriaxone and azithromycin in the emergency department.    -Patient was admitted with acute encephalopathy that was likely multifactorial given metabolic disturbances as well as respiratory failure.  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  Family discussion was had regarding meaningful improvement in her recovery and they understand that this is likely not possible.  After further conversation with palliative care they have elected to proceed with full comfort measures and residential hospice home placement.  During the course of her stay, she was noted to have a seizure and has now been on lorazepam every 6 hours to avoid any further seizure activity.  She was seen by neurology as well.  No other acute events or concerns noted during this hospitalization and she is stable for discharge to hospice facility today.  Discharge Diagnoses:  Active Problems:   Acute respiratory failure with hypoxia (HCC)   Seizures (HCC)   Aspiration pneumonitis (HCC)   Acute metabolic encephalopathy   Respiratory distress   Hypoxia  Principal discharge diagnosis: Acute encephalopathy multifactorial with metabolic disturbances and respiratory failure.  Discharge Instructions  Discharge Instructions     Diet - low sodium heart healthy   Complete by: As directed    Increase activity slowly   Complete by: As directed       Allergies as of 02/21/2021       Reactions   Tramadol    Other reaction(s): Seizures   Aspirin Hives   Aspirin Other (See Comments)   Pt reports hives with any dose above .        Medication List  STOP taking these medications    aspirin EC 81 MG tablet   cefdinir 300 MG capsule Commonly known as: OMNICEF   Lacosamide 150 MG Tabs   lamoTRIgine 200 MG tablet Commonly known as: LaMICtal   lisinopril 10 MG tablet Commonly known as: ZESTRIL   loperamide 2 MG  tablet Commonly known as: IMODIUM A-D   melatonin 3 MG Tabs tablet   metFORMIN 500 MG tablet Commonly known as: GLUCOPHAGE   mirtazapine 7.5 MG tablet Commonly known as: REMERON   multivitamin with minerals Tabs tablet   NovoLOG FlexPen 100 UNIT/ML FlexPen Generic drug: insulin aspart   OLANZapine 10 MG tablet Commonly known as: ZYPREXA   phenytoin 50 MG tablet Commonly known as: DILANTIN   Vitamin D-3 125 MCG (5000 UT) Tabs   Zinc Oxide 6 % Crea       TAKE these medications    acetaminophen 325 MG tablet Commonly known as: TYLENOL Take 650 mg by mouth every 6 (six) hours as needed for mild pain.   LORazepam 2 MG/ML concentrated solution Commonly known as: ATIVAN Take 0.5 mLs (1 mg total) by mouth every 6 (six) hours. What changed:  when to take this reasons to take this Another medication with the same name was removed. Continue taking this medication, and follow the directions you see here.   morphine CONCENTRATE 10 MG/0.5ML Soln concentrated solution Place 0.25 mLs (5 mg total) under the tongue every 2 (two) hours as needed for severe pain, shortness of breath or moderate pain.        Contact information for after-discharge care     Destination     [facility] .   Service: Skilled Nursing Contact information: [address] [place] [address] [phone]                    Allergies  Allergen Reactions   Tramadol     Other reaction(s): Seizures   Aspirin Hives   Aspirin Other (See Comments)    Pt reports hives with any dose above 81mg .    Consultations: Neurology Palliative care   Procedures/Studies: DG CHEST PORT 1 VIEW  Result Date: 02/13/2021 CLINICAL DATA:  Respiratory distress. EXAM: PORTABLE CHEST 1 VIEW COMPARISON:  Same day. FINDINGS: The heart size and mediastinal contours are within normal limits. No pneumothorax is noted. Mild bibasilar subsegmental atelectasis is  noted with probable small right pleural effusion. The visualized skeletal structures are unremarkable. IMPRESSION: Mild bibasilar subsegmental atelectasis with probable small right pleural effusion. Electronically Signed   By: 02/15/2021 M.D.   On: 02/13/2021 18:53   DG Chest Port 1 View  Result Date: 02/13/2021 CLINICAL DATA:  Aspiration EXAM: PORTABLE CHEST 1 VIEW COMPARISON:  09/21/2020 FINDINGS: Normal cardiac silhouette. No effusion, infiltrate or pneumothorax. No pneumonia or aspiration pneumonitis identified. Lordotic view. No acute osseous abnormality. IMPRESSION: No evidence pneumonia or aspiration pneumonitis. Electronically Signed   By: 11/21/2020 M.D.   On: 02/13/2021 07:54   EEG adult  Result Date: 02/16/2021 04/18/2021, MD     02/16/2021  5:41 PM Patient Name: Anaria Kroner MRN: Virgel Gess Epilepsy Attending: [id number] Referring Physician/Provider: Dr Charlsie Quest Date: 02/16/2021 Duration: 25.14 mins Patient history: 80 year old female with history of epilepsy presented with altered mental status.  EEG to evaluate for seizures. Level of alertness: Awake AEDs during EEG study: Keppra, lacosamide, lamotrigine Technical aspects: This EEG study was done with scalp electrodes positioned according to  the 10-20 International system of electrode placement. Electrical activity was acquired at a sampling rate of 500Hz  and reviewed with a high frequency filter of 70Hz  and a low frequency filter of 1Hz . EEG data were recorded continuously and digitally stored. Description: No clear posterior dominant rhythm was seen. EEG showed continuous generalized 5 to 6 Hz theta slowing. Hyperventilation and photic stimulation were not performed.   Of note, study was technically difficult due to significant eye flutter artifact. ABNORMALITY - Continuous slow, generalized IMPRESSION: This technically difficult study is suggestive of mild to moderate diffuse encephalopathy, nonspecific  etiology. No seizures or epileptiform discharges were seen throughout the recording. If concern for ictal-interictal activity persists, please consider repeat study.   EEG adult  Result Date: 02/13/2021 , MD     02/13/2021  4:56 PM Patient Name: Deborah Lazcano MRN: Charlsie Quest Epilepsy Attending: 02/15/2021 Referring Physician/Provider: Dr Virgel Gess Tat Date: 02/13/2021 Duration: 24.51 mins Patient history: 80yo F with h/o epilepsy and ams. EEG to evaulate for seizure. Level of alertness: Awake AEDs during EEG study: None Technical aspects: This EEG study was done with scalp electrodes positioned according to the 10-20 International system of electrode placement. Electrical activity was acquired at a sampling rate of 500Hz  and reviewed with a high frequency filter of 70Hz  and a low frequency filter of 1Hz . EEG data were recorded continuously and digitally stored. Description: No posterior dominant rhythm was seen. EEG showed continuous generalized 3 to 6 Hz theta-delta slowing. Abundant spikes were noted in left temporo-parietal region. Hyperventilation and photic stimulation were not performed.   ABNORMALITY -Spike, left temporo-parietal region - Continuous slow, generalized IMPRESSION: This study showed evidence of epileptogenicity arising from left temporo-parietal region. There is also suggestive of moderate diffuse encephalopathy, nonspecific etiology. No seizures were seen throughout the recording. Onalee Hua     Discharge Exam: Vitals:   02/20/21 1415 02/20/21 2108  BP: (!) 157/91 (!) 143/70  Pulse: 74 65  Resp: 18 18  Temp: (!) 97.5 F (36.4 C) 99 F (37.2 C)  SpO2: 97% 94%   Vitals:   02/19/21 1251 02/19/21 2056 02/20/21 1415 02/20/21 2108  BP: 140/64 (!) 109/57 (!) 157/91 (!) 143/70  Pulse: 76 67 74 65  Resp: 17 16 18 18   Temp: 100.3 F (37.9 C) 98.8 F (37.1 C) (!) 97.5 F (36.4 C) 99 F (37.2 C)  TempSrc:  Oral Oral Oral  SpO2:  97% 95% 97% 94%  Weight:      Height:        General: Pt is alert, awake, not in acute distress Cardiovascular: RRR, S1/S2 +, no rubs, no gallops Respiratory: CTA bilaterally, no wheezing, no rhonchi Abdominal: Soft, NT, ND, bowel sounds + Extremities: no edema, no cyanosis    The results of significant diagnostics from this hospitalization (including imaging, microbiology, ancillary and laboratory) are listed below for reference.     Microbiology: Recent Results (from the past 240 hour(s))  Resp Panel by RT-PCR (Flu A&B, Covid) Nasopharyngeal Swab     Status: None   Collection Time: 02/13/21 10:20 AM   Specimen: Nasopharyngeal Swab; Nasopharyngeal(NP) swabs in vial transport medium  Result Value Ref Range Status   SARS Coronavirus 2 by RT PCR NEGATIVE NEGATIVE Final    Comment: (NOTE) SARS-CoV-2 target nucleic acids are NOT DETECTED.  The SARS-CoV-2 RNA is generally detectable in upper respiratory specimens during the acute phase of infection. The lowest concentration of SARS-CoV-2 viral copies this assay  can detect is 138 copies/mL. A negative result does not preclude SARS-Cov-2 infection and should not be used as the sole basis for treatment or other patient management decisions. A negative result may occur with  improper specimen collection/handling, submission of specimen other than nasopharyngeal swab, presence of viral mutation(s) within the areas targeted by this assay, and inadequate number of viral copies(<138 copies/mL). A negative result must be combined with clinical observations, patient history, and epidemiological information. The expected result is Negative.  Fact Sheet for Patients:  BloggerCourse.com  Fact Sheet for Healthcare Providers:  SeriousBroker.it  This test is no t yet approved or cleared by the [place] FDA and  has been authorized for detection and/or diagnosis of SARS-CoV-2 by FDA under  an Emergency Use Authorization (EUA). This EUA will remain  in effect (meaning this test can be used) for the duration of the COVID-19 declaration under Section 564(b)(1) of the Act, 21 U.S.C.section 360bbb-3(b)(1), unless the authorization is terminated  or revoked sooner.       Influenza A by PCR NEGATIVE NEGATIVE Final   Influenza B by PCR NEGATIVE NEGATIVE Final    Comment: (NOTE) The Xpert Xpress SARS-CoV-2/FLU/RSV plus assay is intended as an aid in the diagnosis of influenza from Nasopharyngeal swab specimens and should not be used as a sole basis for treatment. Nasal washings and aspirates are unacceptable for Xpert Xpress SARS-CoV-2/FLU/RSV testing.  Fact Sheet for Patients: BloggerCourse.com  Fact Sheet for Healthcare Providers: SeriousBroker.it  This test is not yet approved or cleared by the [place] FDA and has been authorized for detection and/or diagnosis of SARS-CoV-2 by FDA under an Emergency Use Authorization (EUA). This EUA will remain in effect (meaning this test can be used) for the duration of the COVID-19 declaration under Section 564(b)(1) of the Act, 21 U.S.C. section 360bbb-3(b)(1), unless the authorization is terminated or revoked.  Performed at [facility], [address] [place], [place] [address]   Urine Culture     Status: None   Collection Time: 02/14/21 11:33 AM   Specimen: Urine, Clean Catch  Result Value Ref Range Status   Specimen Description   Final    URINE, CLEAN CATCH Performed at [facility], [address] [place], [place] [address]    Special Requests   Final    NONE Performed at [facility], [address] [place], [place] [address]    Culture   Final    NO GROWTH Performed at [facility], [address], [place], [place] [address]    Report Status 02/16/2021 FINAL  Final     Labs: BNP (last 3 results) Recent Labs    02/13/21 0728  BNP 256.0*   Basic  Metabolic Panel: Recent Labs  Lab 02/15/21 0526  NA 143  K 3.4*  CL 108  CO2 28  GLUCOSE 153*  BUN 8  CREATININE 0.71  CALCIUM 8.5*  MG 2.0   Liver Function Tests: Recent Labs  Lab 02/15/21 0526  AST 16  ALT 5  ALKPHOS 65  BILITOT 0.2*  PROT 5.8*  ALBUMIN 2.5*   No results for input(s): LIPASE, AMYLASE in the last 168 hours. No results for input(s): AMMONIA in the last 168 hours. CBC: Recent Labs  Lab 02/15/21 0526  WBC 10.9*  NEUTROABS 8.5*  HGB 9.5*  HCT 29.9*  MCV 95.8  PLT 271   Cardiac Enzymes: No results for input(s): CKTOTAL, CKMB, CKMBINDEX, TROPONINI in the last 168 hours. BNP: Invalid input(s): POCBNP CBG:  No results for input(s): GLUCAP in the last 168 hours. D-Dimer No results for input(s): DDIMER in the last 72 hours. Hgb A1c No results for input(s): HGBA1C in the last 72 hours. Lipid Profile No results for input(s): CHOL, HDL, LDLCALC, TRIG, CHOLHDL, LDLDIRECT in the last 72 hours. Thyroid function studies No results for input(s): TSH, T4TOTAL, T3FREE, THYROIDAB in the last 72 hours.  Invalid input(s): FREET3 Anemia work up No results for input(s): VITAMINB12, FOLATE, FERRITIN, TIBC, IRON, RETICCTPCT in the last 72 hours. Urinalysis    Component Value Date/Time   COLORURINE STRAW (A) 02/14/2021 1133   APPEARANCEUR CLEAR 02/14/2021 1133   LABSPEC 1.008 02/14/2021 1133   PHURINE 5.0 02/14/2021 1133   GLUCOSEU NEGATIVE 02/14/2021 1133   HGBUR SMALL (A) 02/14/2021 1133   BILIRUBINUR NEGATIVE 02/14/2021 1133   KETONESUR NEGATIVE 02/14/2021 1133   PROTEINUR NEGATIVE 02/14/2021 1133   UROBILINOGEN 0.2 04/22/2014 0801   NITRITE NEGATIVE 02/14/2021 1133   LEUKOCYTESUR NEGATIVE 02/14/2021 1133   Sepsis Labs Invalid input(s): PROCALCITONIN,  WBC,  LACTICIDVEN Microbiology Recent Results (from the past 240 hour(s))  Resp Panel by RT-PCR (Flu A&B, Covid) Nasopharyngeal Swab     Status: None   Collection Time: 02/13/21 10:20 AM   Specimen:  Nasopharyngeal Swab; Nasopharyngeal(NP) swabs in vial transport medium  Result Value Ref Range Status   SARS Coronavirus 2 by RT PCR NEGATIVE NEGATIVE Final    Comment: (NOTE) SARS-CoV-2 target nucleic acids are NOT DETECTED.  The SARS-CoV-2 RNA is generally detectable in upper respiratory specimens during the acute phase of infection. The lowest concentration of SARS-CoV-2 viral copies this assay can detect is 138 copies/mL. A negative result does not preclude SARS-Cov-2 infection and should not be used as the sole basis for treatment or other patient management decisions. A negative result may occur with  improper specimen collection/handling, submission of specimen other than nasopharyngeal swab, presence of viral mutation(s) within the areas targeted by this assay, and inadequate number of viral copies(<138 copies/mL). A negative result must be combined with clinical observations, patient history, and epidemiological information. The expected result is Negative.  Fact Sheet for Patients:  BloggerCourse.com  Fact Sheet for Healthcare Providers:  SeriousBroker.it  This test is no t yet approved or cleared by the [place] FDA and  has been authorized for detection and/or diagnosis of SARS-CoV-2 by FDA under an Emergency Use Authorization (EUA). This EUA will remain  in effect (meaning this test can be used) for the duration of the COVID-19 declaration under Section 564(b)(1) of the Act, 21 U.S.C.section 360bbb-3(b)(1), unless the authorization is terminated  or revoked sooner.       Influenza A by PCR NEGATIVE NEGATIVE Final   Influenza B by PCR NEGATIVE NEGATIVE Final    Comment: (NOTE) The Xpert Xpress SARS-CoV-2/FLU/RSV plus assay is intended as an aid in the diagnosis of influenza from Nasopharyngeal swab specimens and should not be used as a sole basis for treatment. Nasal washings and aspirates are unacceptable for  Xpert Xpress SARS-CoV-2/FLU/RSV testing.  Fact Sheet for Patients: BloggerCourse.com  Fact Sheet for Healthcare Providers: SeriousBroker.it  This test is not yet approved or cleared by the [place] FDA and has been authorized for detection and/or diagnosis of SARS-CoV-2 by FDA under an Emergency Use Authorization (EUA). This EUA will remain in effect (meaning this test can be used) for the duration of the COVID-19 declaration under Section 564(b)(1) of the Act, 21 U.S.C. section 360bbb-3(b)(1), unless the authorization is terminated or revoked.  Performed at [facility], [address] [place], [place] [address]   Urine Culture     Status: None   Collection Time: 02/14/21 11:33 AM   Specimen: Urine, Clean Catch  Result Value Ref Range Status   Specimen Description   Final    URINE, CLEAN CATCH Performed at [facility], [address] [place], [place] [address]    Special Requests   Final    NONE Performed at [facility], [address] [place], [place] [address]    Culture   Final    NO GROWTH Performed at [facility], [address] [place], [place] [address]    Report Status 02/16/2021 FINAL  Final     Time coordinating discharge: 35 minutes  SIGNED:   Erick BlinksPratik D Rodrick Payson, DO [facility] 02/21/2021, 12:59 PM  If 7PM-7AM, please contact night-coverage [email or web address]

## 2021-09-03 ENCOUNTER — Other Ambulatory Visit: Payer: Self-pay

## 2021-09-03 ENCOUNTER — Non-Acute Institutional Stay: Payer: Medicare Other | Admitting: Primary Care

## 2021-09-03 DIAGNOSIS — Z515 Encounter for palliative care: Secondary | ICD-10-CM

## 2021-09-03 DIAGNOSIS — R413 Other amnesia: Secondary | ICD-10-CM

## 2021-09-03 DIAGNOSIS — R269 Unspecified abnormalities of gait and mobility: Secondary | ICD-10-CM

## 2021-09-03 NOTE — Progress Notes (Signed)
 Therapist, nutritional Palliative Care Consult Note Telephone: [phone]  Fax: [phone]   Date of encounter: 09/03/21 4:00 PM PATIENT NAME: Brooke Gross [facility] [address] [place] [address]   [phone] (home) [phone] (work) DOB: [birth year] MRN: [id number] PRIMARY CARE PROVIDER:    Dawayne Kerney SQUIBB, MD,  [address] [place] [address] [phone]  REFERRING PROVIDER:   Wilmon Penton, NP Wilmon Penton, NP [address] [place],  [place] [address] [phone]   RESPONSIBLE PARTY:    Contact Information     Name Relation Home Work Mobile   [place] Son   [phone]   [facility] Sister [phone]          I met face to face with patient in [facility] facility. Palliative Care was asked to follow this patient by consultation request of  Gammon, Chrystal, NP  to address advance care planning and complex medical decision making. This is the initial visit.                                     ASSESSMENT AND PLAN / RECOMMENDATIONS:   Advance Care Planning/Goals of Care: Goals include to maximize quality of life and symptom management. Patient/health care surrogate gave his/her permission to discuss.Our advance care planning conversation included a discussion about:     Exploration of personal, cultural or spiritual beliefs that might influence medical decisions  Exploration of goals of care in the event of a sudden injury or illness  Identification of a healthcare agent - son, attempted to call, no answer Review of an  advance directive document . CODE STATUS: DNR-  I  reviewed a MOST form today. The patient and family outlined their wishes for the following treatment decisions:  Cardiopulmonary Resuscitation: Do Not Attempt Resuscitation (DNR/No CPR)  Medical Interventions: Comfort Measures: Keep clean, warm, and dry. Use medication by any route, positioning, wound care, and other measures to  relieve pain and suffering. Use oxygen, suction and manual treatment of airway obstruction as needed for comfort. Do not transfer to the hospital unless comfort needs cannot be met in current location.  Antibiotics: Determine use of limitation of antibiotics when infection occurs  IV Fluids: IV fluids for a defined trial period  Feeding Tube: No feeding tube    Symptom Management/Plan:  Patient admitted to [facility] after hospice d/c. I met with her in her SNF. She endorses liking to read and watch the birds. She became tearful saying her son did not visit much. I reached out x 3 to speak with him but no ability to leave message.  ACP reviewed as above and in place.    Weight and intake are increasing. She appears WNWD . She can mobilize in w/c in SNF. She appears to not have discomforts and is able to make needs known.   Follow up Palliative Care Visit: Palliative care will continue to follow for complex medical decision making, advance care planning, and clarification of goals. Return 4-6 weeks or prn.  I spent 35 minutes providing this consultation. More than 50% of the time in this consultation was spent in counseling and care coordination.  PPS: 40%  HOSPICE ELIGIBILITY/DIAGNOSIS: no, recent d/c from hospice as stable  Chief Complaint: debility, dementia  HISTORY OF PRESENT ILLNESS:  Brooke Gross is a 81 y.o. year old female  with debility, immobility, dementia, anxiety, seizure disorder . Patient seen  today to review palliative care needs to include medical decision making and advance care planning as appropriate.   History obtained from review of EMR, discussion with primary team, and interview with family, facility staff/caregiver and/or Ms. Newlon.  I reviewed available labs, medications, imaging, studies and related documents from the EMR.  Records reviewed and summarized above.   ROS  General: NAD ENMT: denies dysphagia Cardiovascular: denies chest pain, denies  DOE Pulmonary: denies cough, denies increased SOB Abdomen: endorses good appetite, denies constipation, endorses incontinence of bowel GU: denies dysuria, endorses incontinence of urine MSK:  denies increased weakness,  no falls reported Skin: denies rashes or wounds Neurological: denies pain, denies insomnia Psych: Endorses positive mood Heme/lymph/immuno: denies bruises, abnormal bleeding  Physical Exam: Current and past weights: 180 lbs reported Constitutional: NAD General: frail appearing, WNWD EYES: anicteric sclera, lids intact, no discharge  ENMT: intact hearing, oral mucous membranes moist CV: no LE edema Pulmonary: no increased work of breathing, no cough, room air Abdomen: intake 75-100%,  no ascites GU: deferred MSK: no sarcopenia, moves all extremities, non ambulatory Skin: warm and dry, no  wounds on visible skin, facial rash Neuro:  + generalized weakness,  ++cognitive impairment Psych: non-anxious affect, A and O x 1-2 Hem/lymph/immuno: no widespread bruising CURRENT PROBLEM LIST:  Patient Active Problem List   Diagnosis Date Noted   Hypoxia    Aspiration pneumonitis (HCC) 02/13/2021   Acute metabolic encephalopathy 02/13/2021   Respiratory distress    Bradycardia 09/22/2020   Hypothermia 09/22/2020   Hyperglycemia due to diabetes mellitus (HCC) 09/22/2020   AKI (acute kidney injury) (HCC) 09/22/2020   Hyperlipidemia 09/22/2020   GERD (gastroesophageal reflux disease) 09/22/2020   Altered mental status 09/22/2020   Cerebrovascular accident (CVA) (HCC) 09/18/2017   Seizures (HCC) 03/17/2017   Memory loss 03/17/2017   Gait abnormality 03/17/2017   Seizure disorder (HCC) 04/22/2014   Anxiety and depression 04/22/2014   Acute respiratory failure with hypoxia (HCC) 04/13/2014   Severe sepsis with septic shock (HCC) 04/13/2014   Anemia, iron deficiency 04/13/2014   Essential hypertension    Status epilepticus (HCC) 03/18/2014   Non-ST elevation myocardial  infarction (NSTEMI), subsequent episode of care (HCC) 03/18/2014   Diabetes mellitus type 2, controlled, without complications (HCC)    Abnormality of gait 12/15/2013   PAST MEDICAL HISTORY:  Active Ambulatory Problems    Diagnosis Date Noted   Abnormality of gait 12/15/2013   Status epilepticus (HCC) 03/18/2014   Non-ST elevation myocardial infarction (NSTEMI), subsequent episode of care (HCC) 03/18/2014   Diabetes mellitus type 2, controlled, without complications (HCC)    Acute respiratory failure with hypoxia (HCC) 04/13/2014   Severe sepsis with septic shock (HCC) 04/13/2014   Anemia, iron deficiency 04/13/2014   Essential hypertension    Seizure disorder (HCC) 04/22/2014   Anxiety and depression 04/22/2014   Seizures (HCC) 03/17/2017   Memory loss 03/17/2017   Gait abnormality 03/17/2017   Cerebrovascular accident (CVA) (HCC) 09/18/2017   Bradycardia 09/22/2020   Hypothermia 09/22/2020   Hyperglycemia due to diabetes mellitus (HCC) 09/22/2020   AKI (acute kidney injury) (HCC) 09/22/2020   Hyperlipidemia 09/22/2020   GERD (gastroesophageal reflux disease) 09/22/2020   Altered mental status 09/22/2020   Aspiration pneumonitis (HCC) 02/13/2021   Acute metabolic encephalopathy 02/13/2021   Respiratory distress    Hypoxia    Resolved Ambulatory Problems    Diagnosis Date Noted   No Resolved Ambulatory Problems   Past Medical History:  Diagnosis Date   Anemia  Anxiety    Anxiety disorder    Bipolar 1 disorder (HCC)    CKD (chronic kidney disease)    Depression    Diabetes (HCC)    Dysphagia    Epilepsy (HCC)    Gait difficulty    Hypercholesteremia    Hyperlipemia    Hypertension    Immunodeficiency (HCC)    Major depressive disorder    Malaise    Migraine    NSTEMI, initial episode of care (HCC) 03/18/2014   Obese    Sciatica    Tremor    Vertigo, labyrinthine    SOCIAL HX:  Social History   Tobacco Use   Smoking status: Former    Packs/day: 0.50     Years: 15.00    Pack years: 7.50    Types: Cigarettes    Quit date: 2007    Years since quitting: 16.2   Smokeless tobacco: Current    Types: Chew   Tobacco comments:    quit 1977  Substance Use Topics   Alcohol  use: Not Currently    Comment: occasional   FAMILY HX:  Family History  Problem Relation Age of Onset   Other Mother        unsure of history   Heart attack Father    Dementia Mother    Congestive Heart Failure Mother    Dementia Father       ALLERGIES:  Allergies  Allergen Reactions   Tramadol     Other reaction(s): Seizures   Aspirin  Hives   Aspirin  Other (See Comments)    Pt reports hives with any dose above 81mg .     PERTINENT MEDICATIONS:  Outpatient Encounter Medications as of 09/03/2021  Medication Sig   acetaminophen  (TYLENOL ) 325 MG tablet Take 650 mg by mouth every 6 (six) hours as needed for mild pain.   LORazepam  (ATIVAN ) 2 MG/ML concentrated solution Take 0.5 mLs (1 mg total) by mouth every 6 (six) hours.   Morphine  Sulfate (MORPHINE  CONCENTRATE) 10 MG/0.5ML SOLN concentrated solution Place 0.25 mLs (5 mg total) under the tongue every 2 (two) hours as needed for severe pain, shortness of breath or moderate pain.   No facility-administered encounter medications on file as of 09/03/2021.   Thank you for the opportunity to participate in the care of Ms. Branscum.  The palliative care team will continue to follow. Please call our office at [phone] if we can be of additional assistance.   Lamarr Welby Sharps, NP , DNP, AGPCNP-BC  COVID-19 PATIENT SCREENING TOOL Asked and negative response unless otherwise noted:  Have you had symptoms of covid, tested positive or been in contact with someone with symptoms/positive test in the past 5-10 days?

## 2021-10-17 ENCOUNTER — Non-Acute Institutional Stay: Payer: Medicare Other | Admitting: Primary Care

## 2021-10-17 DIAGNOSIS — R269 Unspecified abnormalities of gait and mobility: Secondary | ICD-10-CM

## 2021-10-17 DIAGNOSIS — Z515 Encounter for palliative care: Secondary | ICD-10-CM

## 2021-10-17 DIAGNOSIS — R413 Other amnesia: Secondary | ICD-10-CM

## 2021-10-17 NOTE — Progress Notes (Signed)
? ? ?Manufacturing engineer ?Community Palliative Care Consult Note ?Telephone: [phone]  ?Fax: [phone]  ? ? ?Date of encounter: 10/17/21 ?10:09 AM ?PATIENT NAME: Allanah Boswell Baisch ?[facility] ?[address] ?[place] [address]   ?[phone] (home) [phone] (work) ?DOB: 18-Sep-1940 ?MRN: [id number] ?PRIMARY CARE PROVIDER:    ?Bonnita Nasuti, MD,  ?[address] ?[place] [address] ?[phone] ? ?REFERRING PROVIDER:   ?Bonnita Nasuti, MD ?[address] ?[place],  [place] [address] ?[phone] ? ?RESPONSIBLE PARTY:    ?Contact Information   ? ? Name Relation Home Work Mobile  ? Ralene [place] Son   [phone]  ? Evett,Alma Sister [phone]    ? ?  ? ? ? ?I met face to face with patient in [facility] facility. Palliative Care was asked to follow this patient by consultation request of  Hague, Rosalyn Charters, MD to address advance care planning and complex medical decision making. This is a follow up visit. ? ?                                 ASSESSMENT AND PLAN / RECOMMENDATIONS:  ? ?Advance Care Planning/Goals of Care: Goals include to maximize quality of life and symptom management. Patient/health care surrogate gave his/her permission to discuss.Our advance care planning conversation included a discussion about:    ?Exploration of personal, cultural or spiritual beliefs that might influence medical decisions  ?Identification of a healthcare agent - son. Pt states she never talks with him, does not have a phone ?Review of an  advance directive document . ?CODE STATUS: DNR ? ?Symptom Management/Plan: ? ?I met with patient in her nursing home. She was calm and pleasant. She endorses that she feels well and enjoys reading. She also enjoys social interaction at the nursing home including activities. She recounts her episode where she burned herself with coffee. This has been almost three months ago now. She states the area is healed that I'm not able to visualize it due to her clothing  constriction. She endorses that she does not hear from her son and that she would like to have a cell phone. No other concerns or complaints at this time I will continue to follow every 8 to 12 weeks. ? ?Follow up Palliative Care Visit: Palliative care will continue to follow for complex medical decision making, advance care planning, and clarification of goals. Return 8 weeks or prn. ? ?I spent 15 minutes providing this consultation. More than 50% of the time in this consultation was spent in counseling and care coordination. ? ? ?PPS: 40% ? ?HOSPICE ELIGIBILITY/DIAGNOSIS: no ? ?Chief Complaint: immobility ? ?HISTORY OF PRESENT ILLNESS:  Sharifa Mattilyn Crites is a 81 y.o. year old female  with dementia, immobility . Patient seen today to review palliative care needs to include medical decision making and advance care planning as appropriate.  ? ?History obtained from review of EMR, discussion with primary team, and interview with family, facility staff/caregiver and/or Ms. Boeke.  ?I reviewed available labs, medications, imaging, studies and related documents from the EMR.  Records reviewed and summarized above.  ? ?ROS ? ? ?General: NAD ?ENMT: denies dysphagia ?Cardiovascular: denies chest pain, denies DOE ?Pulmonary: denies cough, denies increased SOB ?Abdomen: endorses good appetite, denies constipation, endorses continence of bowel ?GU: denies dysuria, endorses continence of urine ?MSK:  denies  increased weakness,  + falls reported ?Psych: Endorses positive mood ? ?Physical Exam: ?Current and  past weights: ?Constitutional: NAD ?General: frail appearing,   ?EYES: anicteric sclera, lids intact, no discharge  ?ENMT: intact hearing, oral mucous membranes moist, dentition intact ?CV: no LE edema ?Pulmonary: no increased work of breathing, no cough, room air ?Abdomen: intake 75%,  no ascites ?MSK: mod  sarcopenia, moves all extremities,  non ambulatory ?Skin: warm and dry, no rashes or wounds on visible  skin ?Neuro:  + generalized weakness,  + cognitive impairment, non-anxious affect ? ?Thank you for the opportunity to participate in the care of Ms. Maahs.  The palliative care team will continue to follow. Please call our office at [phone] if we can be of additional assistance.  ? ?Jason Coop, NP DNP, AGPCNP-BC ? ?COVID-19 PATIENT SCREENING TOOL ?Asked and negative response unless otherwise noted:  ? ?Have you had symptoms of covid, tested positive or been in contact with someone with symptoms/positive test in the past 5-10 days?  ? ?

## 2021-12-21 ENCOUNTER — Non-Acute Institutional Stay: Payer: Medicare Other | Admitting: Primary Care

## 2022-03-28 ENCOUNTER — Non-Acute Institutional Stay: Payer: Medicare Other | Admitting: Primary Care

## 2022-03-28 DIAGNOSIS — R413 Other amnesia: Secondary | ICD-10-CM

## 2022-03-28 DIAGNOSIS — Z515 Encounter for palliative care: Secondary | ICD-10-CM

## 2022-03-28 NOTE — Progress Notes (Deleted)
Brooke Gross Consult Note Telephone: [phone]  Fax: [phone]    Date of encounter: 03/28/22 10:40 AM PATIENT NAME: Brooke Gross [id number]   [phone] (home) [phone] (work) DOB: May 18, 1941 MRN: [id number] PRIMARY CARE PROVIDER:    Bonnita Nasuti, MD,  [address] [place] [address] [phone]  REFERRING PROVIDER:   Bonnita Nasuti, MD [address] [place],  [place] [address] [phone]  RESPONSIBLE PARTY:    Contact Information     Name Relation Home Work [place] Son   [phone]   [facility] [phone]         I met face to face with patient and family in *** home/facility. Palliative Care was asked to follow this patient by consultation request of  Hague, Rosalyn Charters, MD to address advance care planning and complex medical decision making. This is a follow up visit.                                   ASSESSMENT AND PLAN / RECOMMENDATIONS:   Advance Care Planning/Goals of Care: Goals include to maximize quality of life and symptom management. Patient/health care surrogate gave his/her permission to discuss.Our advance care planning conversation included a discussion about:    The value and importance of advance care planning  Experiences with loved ones who have been seriously ill or have died  Exploration of personal, cultural or spiritual beliefs that might influence medical decisions  Exploration of goals of care in the event of a sudden injury or illness  Identification of a healthcare agent  Review of an  advance directive document . CODE STATUS:  Symptom Management/Plan:    Follow up Palliative Care Visit: Informed of end of home visiting NP program.  I spent *** minutes providing this consultation. More than 50% of the time in this consultation was spent in counseling and care coordination.  This visit  was coded based on medical decision making (MDM).***  PPS: ***0%  HOSPICE ELIGIBILITY/DIAGNOSIS: TBD  Chief Complaint: ***  HISTORY OF PRESENT ILLNESS:  Brooke Gross is a 81 y.o. year old female  with *** . Patient seen today to review palliative care needs to include medical decision making and advance care planning as appropriate.   History obtained from review of EMR, discussion with primary team, and interview with family, facility staff/caregiver and/or Ms. Winborne.  I reviewed available labs, medications, imaging, studies and related documents from the EMR.  Records reviewed and summarized above.   ROS  *** General: NAD EYES: denies vision changes ENMT: denies dysphagia Cardiovascular: denies chest pain, denies DOE Pulmonary: denies cough, denies increased SOB Abdomen: endorses good appetite, denies constipation, endorses continence of bowel GU: denies dysuria, endorses continence of urine MSK:  denies  increased weakness,  no falls reported Skin: denies rashes or wounds Neurological: denies pain, denies insomnia Psych: Endorses positive mood  Physical Exam: Current and past weights: Constitutional: NAD General: frail appearing, thin/WNWD/obese  EYES: anicteric sclera, lids intact, no discharge  ENMT: intact hearing, oral mucous membranes moist, dentition intact CV: S1S2, RRR, no LE edema Pulmonary: LCTA, no increased work of breathing, no cough, room air Abdomen: intake 100%, normo-active BS + 4 quadrants, soft and non tender, no ascites MSK: no sarcopenia, moves all extremities, ambulatory Skin: warm and dry, no rashes or wounds on visible  skin Neuro:  no generalized weakness,  no cognitive impairment, non-anxious affect   Thank you for the opportunity to participate in the care of Brooke Gross.   Jason Coop, NP DNP, AGPCNP-BC  COVID-19 PATIENT SCREENING TOOL Asked and negative response unless otherwise noted:   Have you had symptoms of covid,  tested positive or been in contact with someone with symptoms/positive test in the past 5-10 days?

## 2022-03-28 NOTE — Progress Notes (Signed)
Therapist, nutritional Palliative Care Consult Note Telephone: [phone]  Fax: [phone]    Date of encounter: 03/28/22 10:42 AM PATIENT NAME: Brooke Gross [facility] [address] [place] [address]   [phone] (home) [phone] (work) DOB: 1941/05/09 MRN: [id number] PRIMARY CARE PROVIDER:    Galvin Proffer, MD,  [address] [place] [address] [phone]  REFERRING PROVIDER:   Charlynne Pander, MD Charlynne Pander, MD [address] [place],  [place] [address]   RESPONSIBLE PARTY:    Contact Information     Name Relation Home Work Mobile   [place] Son   [phone]   [facility] Sister [phone]          I met face to face with patient in [facility] facility. Palliative Care was asked to follow this patient by consultation request of  Charlynne Pander, MD  to address advance care planning and complex medical decision making. This is a follow up visit.                                   ASSESSMENT AND PLAN / RECOMMENDATIONS:   Advance Care Planning/Goals of Care: Goals include to maximize quality of life and symptom management. Patient/health care surrogate gave his/her permission to discuss. Our advance care planning conversation included a discussion about:     Identification of a healthcare agent son and DIL MD notes she is on hospice but she is seen by palliative medicine at this time. CODE STATUS: DNR  Symptom Management/Plan:  I met with patient in her nursing home. She states that she's doing well this morning she's enjoying her coffee and she says she always has a book. She's eating well and denies pain falls or other discomfort. Staff endorses she's at her baseline  able to be up and interactive with other residents and staff. She recently had some PT for improvement of mobility.   Follow up Palliative Care Visit: Palliative care will continue to follow for complex medical decision making, advance care planning, and  clarification of goals. Return 8 weeks or prn.  I spent 15 minutes providing this consultation. More than 50% of the time in this consultation was spent in counseling and care coordination.  PPS: 40%  HOSPICE ELIGIBILITY/DIAGNOSIS: TBD  Chief Complaint: debility, impaired mobility  HISTORY OF PRESENT ILLNESS:  Brooke Gross is a 81 y.o. year old female  with dementia, impairment of mobility, debility .   History obtained from review of EMR, discussion with primary team, and interview with family, facility staff/caregiver and/or Ms. Brooke Gross.  I reviewed available labs, medications, imaging, studies and related documents from the EMR.  Records reviewed and summarized above.   ROS  General: NAD EYES: denies vision changes ENMT: denies dysphagia Cardiovascular: denies chest pain, denies DOE Pulmonary: denies cough, denies increased SOB Abdomen: endorses good appetite, denies constipation, endorses continence of bowel GU: denies dysuria, endorses continence of urine MSK:  denies increased weakness,  no falls reported Skin: denies rashes or wounds Neurological: denies pain, denies insomnia Psych: Endorses positive mood Heme/lymph/immuno: denies bruises, abnormal bleeding  Physical Exam: Current and past weights: 156 lbs; 180 lbs a year ago, 14% loss in a year. Constitutional: NAD General: frail appearing, WNWD EYES: anicteric sclera, lids intact, no discharge  ENMT: intact hearing, oral mucous membranes moist, dentition intact Pulmonary: no increased work of breathing, no cough, room air Abdomen: intake 100%,  soft  and non tender, no ascites GU: deferred MSK: mild  sarcopenia, moves all extremities,  non ambulatory Skin: warm and dry, no rashes or wounds on visible skin Neuro:  no  new generalized weakness,  +cognitive impairment Psych: non-anxious affect, A and O x 2 Hem/lymph/immuno: no widespread bruising   Thank you for the opportunity to participate in the care of  Brooke Gross.  The palliative care team will continue to follow. Please call our office at [phone] if we can be of additional assistance.   Jason Coop DNP, MPH, AGPCNP-BC, ACHPN   COVID-19 PATIENT SCREENING TOOL Asked and negative response unless otherwise noted:   Have you had symptoms of covid, tested positive or been in contact with someone with symptoms/positive test in the past 5-10 days?

## 2022-05-15 ENCOUNTER — Encounter: Payer: Self-pay | Admitting: Nurse Practitioner

## 2022-05-15 ENCOUNTER — Non-Acute Institutional Stay: Payer: Medicare Other | Admitting: Nurse Practitioner

## 2022-05-15 DIAGNOSIS — F0280 Dementia in other diseases classified elsewhere without behavioral disturbance: Secondary | ICD-10-CM

## 2022-05-15 DIAGNOSIS — R5381 Other malaise: Secondary | ICD-10-CM

## 2022-05-15 DIAGNOSIS — Z515 Encounter for palliative care: Secondary | ICD-10-CM

## 2022-05-15 NOTE — Progress Notes (Signed)
    [facility] Consult Note Telephone: [phone]  Fax: [phone]    Date of encounter: 05/15/22 10:25 PM PATIENT NAME: [name] [address]   [phone] (home) [phone] (work) DOB: [birth year] MRN: [id number] PRIMARY CARE PROVIDER:    [facility] [place]  RESPONSIBLE PARTY:    Contact Information     Name Relation Home Work Mobile   [place] Son   [phone]   [facility] Sister [phone]        I met face to face with patient in facility. Palliative Care was asked to follow this patient by consultation request of [facility] to address advance care planning and complex medical decision making. This is a follow up visit.                                  ASSESSMENT AND PLAN / RECOMMENDATIONS:  Symptom Management/Plan: 1. Advance Care Planning;  DNR 2. Goals of Care: Goals include to maximize quality of life and symptom management. Our advance care planning conversation included a discussion about:    The value and importance of advance care planning  Exploration of personal, cultural or spiritual beliefs that might influence medical decisions  Exploration of goals of care in the event of a sudden injury or illness  Identification and preparation of a healthcare agent  Review and updating or creation of an advance directive document. 3. Palliative care encounter; Palliative care encounter; Palliative medicine team will continue to support patient, patient's family, and medical team. Visit consisted of counseling and education dealing with the complex and emotionally intense issues of symptom management and palliative care in the setting of serious and potentially life-threatening illness 4. Dementia 5. Debility   Follow up Palliative Care Visit: Palliative care will continue to follow for complex medical decision making, advance care planning, and  clarification of goals. Return 4 to 6  weeks or prn.  I spent 46 minutes providing this consultation. More than 50% of the time in this consultation was spent in counseling and care coordination. PPS: 50% Chief Complaint: Follow up palliative consult for complex medical decision making, address goals, manage ongoing symptoms  HISTORY OF PRESENT ILLNESS:  Brooke Gross is a 81 y.o. year old female  with multiple medical problems including Dementia  History obtained from review of EMR, discussion with primary team, and interview with family, facility staff/caregiver and/or Ms. Orzechowski.  I reviewed available labs, medications, imaging, studies and related documents from the EMR.  Records reviewed and summarized above.   ROS 10 point system reviewed all negative except HPI  Physical Exam: Constitutional: NAD General: pleasant female, engaging EYES: lids intact ENMT: intact hearing, oral mucous membranes moist CV: S1S2, RRR Pulmonary: LCTA Abdomen: soft and non tender Skin: warm and dry Neuro:  + generalized weakness,  + cognitive impairment Psych: non-anxious affect, Alert, oriented to self, place Thank you for the opportunity to participate in the care of Brooke Gross. Please call our office at [phone] if we can be of additional assistance.   Carliss Quast Ihor Gully, NP

## 2022-07-02 ENCOUNTER — Non-Acute Institutional Stay: Payer: Medicare Other | Admitting: Nurse Practitioner

## 2022-07-02 ENCOUNTER — Encounter: Payer: Self-pay | Admitting: Nurse Practitioner

## 2022-07-02 DIAGNOSIS — Z515 Encounter for palliative care: Secondary | ICD-10-CM

## 2022-07-02 DIAGNOSIS — R5381 Other malaise: Secondary | ICD-10-CM

## 2022-07-02 DIAGNOSIS — F0280 Dementia in other diseases classified elsewhere without behavioral disturbance: Secondary | ICD-10-CM

## 2022-07-02 NOTE — Progress Notes (Addendum)
[facility] Consult Note Telephone: [phone]  Fax: [phone]    Date of encounter: 07/02/22 4:35 PM PATIENT NAME: Brooke Gross [id number]   [phone] (home) [phone] (work) DOB: May 20, 1941 MRN: [id number] PRIMARY CARE PROVIDER:    [facility]   RESPONSIBLE PARTY:    Contact Information     Name Relation Home Work Mobile   [place] Son   [phone]   [place] [facility] Sister [phone]       I met face to face with patient in facility. Palliative Care was asked to follow this patient by consultation request of [facility] to address advance care planning and complex medical decision making. This is a follow up visit.                                  ASSESSMENT AND PLAN / RECOMMENDATIONS:  Symptom Management/Plan: 1. Advance Care Planning;  DNR 2. Goals of Care: Goals include to maximize quality of life and symptom management. Our advance care planning conversation included a discussion about:    The value and importance of advance care planning  Exploration of personal, cultural or spiritual beliefs that might influence medical decisions  Exploration of goals of care in the event of a sudden injury or illness  Identification and preparation of a healthcare agent  Review and updating or creation of an advance directive document. 3. Palliative care encounter; Palliative care encounter; Palliative medicine team will continue to support patient, patient's family, and medical team. Visit consisted of counseling and education dealing with the complex and emotionally intense issues of symptom management and palliative care in the setting of serious and potentially life-threatening illness   4. Dementia progressive, reviewed medications, continue to encourage socialization, interactions, participate in facilities activities. 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    5. Debility ongoing, reviewed functional abilities, w/c dependent. Discussed fall precautions. We talked about importance of being oob, mobility.    Follow up Palliative Care Visit: Palliative care will continue to follow for complex medical decision making, advance care planning, and clarification of goals. Return 4 to 8 weeks or prn.   I spent 45 minutes providing this consultation. More than 50% of the time in this consultation was spent in counseling and care coordination. PPS: 50% Chief Complaint: Follow up palliative consult for complex medical decision making, address goals, manage ongoing symptoms   HISTORY OF PRESENT ILLNESS:  Brooke Gross is a 82 y.o. year old female  with multiple medical problems including Dementia, late onset CVA, h/o non-st myocardial infarction, HTN, h/o respiratory failure with hypoxia, DM, GERD, h/o seizures, HLD, h/o gait abnormality, IDA, anxiety, depression. Brooke Gross resides at [facility], [place]. Brooke Gross is w/c dependent, requires assistance with transfers, mobility. Brooke Gross requires assistance with ADL's, Brooke Gross does feed herself with good appetite. Current weight 170.8 lbs. No recent falls, hospitalizations, infections, wounds per staff. At present Brooke Gross is sitting in the w/c in her room. I visited and observed Brooke Gross, she is in a room with 3 other residence. Brooke Gross made eye contact, nodded to questions, no verbal response until she said "good bye". Brooke Gross was cooperative with assessment. No meaningful discussion with cognitive impairment. Support provided. Attempted to reach Brooke Gross son. Medical goals, medications, poc reviewed. No new updates. Will continue  to monitor/follow weights; appetite, functional and cognitive abilities; progression of dementia; currently stable. Updated staff.   Therapeutic listening, emotional support provided. Questions answered, Medical goals, medications and goc  reviewed. Updated staff no new changes or recommendations today.    History obtained from review of EMR, discussion with primary team, and interview with family, facility staff/caregiver and/or Brooke Gross.  I reviewed available labs, medications, imaging, studies and related documents from the EMR.  Records reviewed and summarized above.    ROS 10 point system reviewed all negative except HPI   Physical Exam: Constitutional: NAD General: debilitated, female EYES: lids intact ENMT: intact hearing, oral mucous membranes moist CV: S1S2, RRR Pulmonary: LCTA Abdomen: soft and non tender Skin: warm and dry Neuro:  + generalized weakness,  + cognitive impairment Psych: non-anxious affect, Alert, oriented to self, very few words Thank you for the opportunity to participate in the care of Brooke Gross. Please call our office at [phone] if we can be of additional assistance.   Deziyah Arvin Ihor Gully, NP

## 2022-09-16 ENCOUNTER — Non-Acute Institutional Stay: Payer: Medicare Other | Admitting: Nurse Practitioner

## 2022-09-16 ENCOUNTER — Encounter: Payer: Self-pay | Admitting: Nurse Practitioner

## 2022-09-16 DIAGNOSIS — R5381 Other malaise: Secondary | ICD-10-CM

## 2022-09-16 DIAGNOSIS — Z515 Encounter for palliative care: Secondary | ICD-10-CM

## 2022-09-16 DIAGNOSIS — F0280 Dementia in other diseases classified elsewhere without behavioral disturbance: Secondary | ICD-10-CM

## 2022-09-16 NOTE — Progress Notes (Signed)
[facility] Consult Note Telephone: [phone]  Fax: [phone]    Date of encounter: 09/16/22 9:08 PM PATIENT NAME: Brooke Gross   [phone] (home) [phone] (work) DOB: 05/13/1941 MRN: [id number] PRIMARY CARE PROVIDER:    [place]:    Contact Information     Name Relation Home Work Mobile   [place] Son   [phone]   [facility] Sister [phone]       I met face to face with patient in facility. Palliative Care was asked to follow this patient by consultation request of [facility] to address advance care planning and complex medical decision making. This is a follow up visit.                                  ASSESSMENT AND PLAN / RECOMMENDATIONS:  Symptom Management/Plan: 1. Advance Care Planning;  DNR 2. Palliative care encounter; Palliative care encounter; Palliative medicine team will continue to support patient, patient's family, and medical team. Visit consisted of counseling and education dealing with the complex and emotionally intense issues of symptom management and palliative care in the setting of serious and potentially life-threatening illness   3. Dementia progressive, reviewed medications, continue to encourage socialization, interactions, participate in facilities activities. 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    4. Debility ongoing, reviewed functional abilities, w/c dependent. Discussed fall precautions. We talked about importance of being oob, mobility.    Follow up Palliative Care Visit: Palliative care will continue to follow for complex medical decision making, advance care planning, and clarification of goals. Return 4 to 8 weeks or prn.   I spent 46 minutes providing this consultation. More than 50% of the time in this consultation was spent in counseling and  care coordination. PPS: 50% Chief Complaint: Follow up palliative consult for complex medical decision making, address goals, manage ongoing symptoms   HISTORY OF PRESENT ILLNESS:  Brooke Gross is a 82 y.o. year old female  with multiple medical problems including Dementia, late onset CVA, h/o non-st myocardial infarction, HTN, h/o respiratory failure with hypoxia, DM, GERD, h/o seizures, HLD, h/o gait abnormality, IDA, anxiety, depression. Brooke Gross resides at [facility], [place]. Brooke Gross is w/c dependent, requires assistance with transfers, mobility. Brooke Gross requires assistance with ADL's, Brooke. Gross does feed herself with good appetite. No recent falls, hospitalizations, infections, wounds per staff. Purpose of today PC f/u visit further discussion monitor trends of appetite, weights, monitor for functional, cognitive decline with chronic disease progression, assess any active symptoms, supportive role. I visited and observed Brooke Gross was sitting in the dining hall waiting for lunch. Brooke Gross was engaging, verbal, making eye contact, friendly. Brooke Gross was cooperative with assessment, able to answer simple questions. Brooke Gross though cognitively limited. Praised Brooke Gross for being in the dining area, oob, mobile. Supportive visit. Reviewed medications, poc, goc, no new changes recommended, currently stable. PC f/u visit further discussion monitor trends of appetite, weights, monitor for functional, cognitive decline with chronic disease progression, assess any active symptoms, supportive role.   Therapeutic listening, emotional support provided. Questions answered, Medical goals, medications and goc reviewed. Updated staff no new changes or recommendations today.    History obtained from review of EMR, discussion with primary team, and interview with family, facility staff/caregiver and/or Brooke.  Quinn Gross.  I reviewed available labs, medications, imaging, studies and related documents from the EMR.   Records reviewed and summarized above.    Physical Exam: Constitutional: NAD General: debilitated, female ENMT: intact hearing, oral mucous membranes moist CV: S1S2, RRR Pulmonary: LCTA Neuro:  + generalized weakness,  + cognitive impairment Psych: non-anxious affect, Alert, oriented to self, very few words Thank you for the opportunity to participate in the care of Brooke Gross. Please call our office at [phone] if we can be of additional assistance.   Cleta Heatley Ihor Gully, NP

## 2022-11-06 ENCOUNTER — Non-Acute Institutional Stay: Payer: Medicare Other | Admitting: Nurse Practitioner

## 2022-11-06 ENCOUNTER — Encounter: Payer: Self-pay | Admitting: Nurse Practitioner

## 2022-11-06 DIAGNOSIS — R5381 Other malaise: Secondary | ICD-10-CM

## 2022-11-06 DIAGNOSIS — F0280 Dementia in other diseases classified elsewhere without behavioral disturbance: Secondary | ICD-10-CM

## 2022-11-06 DIAGNOSIS — Z515 Encounter for palliative care: Secondary | ICD-10-CM

## 2022-11-06 NOTE — Progress Notes (Addendum)
[facility] Consult Note Telephone: [phone]  Fax: [phone]    Date of encounter: 11/06/22 10:23 PM PATIENT NAME: Shandra Boswell [facility] [address] [place] [address]   [phone] (home) [phone] (work) DOB: 25-May-1941 MRN: [id number] PRIMARY CARE PROVIDER:    [facility]  RESPONSIBLE PARTY:    Contact Information     Name Relation Home Work Mobile   [place] Son   [phone]   [place] [facility] Sister [phone]           I met face to face with patient in facility. Palliative Care was asked to follow this patient by consultation request of [facility] to address advance care planning and complex medical decision making. This is a follow up visit.                                  ASSESSMENT AND PLAN / RECOMMENDATIONS:  Symptom Management/Plan: 1. Advance Care Planning;  DNR 2. Palliative care encounter; Palliative care encounter; Palliative medicine team will continue to support patient, patient's family, and medical team. Visit consisted of counseling and education dealing with the complex and emotionally intense issues of symptom management and palliative care in the setting of serious and potentially life-threatening illness   3. Dementia progressive, reviewed medications, continue to encourage socialization, interactions, participate in facilities activities. 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    4. Debility ongoing, reviewed functional abilities, w/c dependent. Discussed fall precautions. We talked about importance of being oob, mobility.    Follow up Palliative Care Visit: Palliative care will continue to follow for complex medical decision making, advance care planning, and clarification of goals. Return 4 to 8 weeks or prn.   I spent 45 minutes providing this consultation. More than 50% of the time in this consultation was spent in  counseling and care coordination. PPS: 50% Chief Complaint: Follow up palliative consult for complex medical decision making, address goals, manage ongoing symptoms   HISTORY OF PRESENT ILLNESS:  Nathan [place] Bruckner is a 82 y.o. year old female  with multiple medical problems including Dementia, late onset CVA, h/o non-st myocardial infarction, HTN, h/o respiratory failure with hypoxia, DM, GERD, h/o seizures, HLD, h/o gait abnormality, IDA, anxiety, depression. Ms. Karlberg resides at [facility], [place]. Ms Gergel is w/c dependent, requires assistance with transfers, mobility. Ms Spitzley requires assistance with ADL's, Ms. Oteri does feed herself with good appetite. No recent falls, hospitalizations, infections, wounds per staff. Purpose of today PC f/u visit further discussion monitor trends of appetite, weights, monitor for functional, cognitive decline with chronic disease progression, assess any active symptoms, supportive role. I visited and observed Ms Mungin was lying in bed. Ms Matthai endorses she was waiting to get oob, she was engaging, talkative today. She does have a red area sacrum and abscess between shoulder blades upper back the primary will be addressing today. We talked about purpose of pc visit though limited with cognitive impairment. We talked about oob which Ms Saunders does get up almost daily. Talked about daily routine, quality of life, activities she likes to do. We talked about appetite, going to dining area for meals, foods she likes. Ms Biers was very interested in pc visit day, cooperative with assessment, support provided. I have attempted to contact her son for update on pc visit. Currently stable with primary to assess abscess/sacrum. PC f/u  visit further discussion monitor trends of appetite, weights, monitor for functional, cognitive decline with chronic disease progression, assess any active symptoms, supportive role. Therapeutic listening, emotional support provided. Questions  answered   History obtained from review of EMR, discussion with primary team, and interview with family, facility staff/caregiver and/or Ms. Cordova.  I reviewed available labs, medications, imaging, studies and related documents from the EMR.  Records reviewed and summarized above.    Physical Exam: Constitutional: NAD General: debilitated, female ENMT: intact hearing, oral mucous membranes moist CV: S1S2, RRR Pulmonary: LCTA Neuro:  + generalized weakness,  + cognitive impairment Psych: non-anxious affect, Alert, oriented to self, very few words Thank you for the opportunity to participate in the care of Ms. Ellenbecker. Please call our office at [phone] if we can be of additional assistance.   Dawayne Ohair Prince [place], NP

## 2023-05-01 ENCOUNTER — Encounter (HOSPITAL_COMMUNITY): Payer: Self-pay | Admitting: Nurse Practitioner

## 2023-05-19 ENCOUNTER — Other Ambulatory Visit (HOSPITAL_COMMUNITY): Payer: Self-pay | Admitting: Nurse Practitioner

## 2023-05-19 DIAGNOSIS — G40911 Epilepsy, unspecified, intractable, with status epilepticus: Secondary | ICD-10-CM

## 2023-11-16 DEATH — deceased
